# Patient Record
Sex: Female | Born: 1935 | Race: White | Hispanic: No | Smoking: Former smoker
Health system: Southern US, Community
[De-identification: ages and names within clinical notes are randomized; demographics above are authoritative.]

## PROBLEM LIST (undated history)

## (undated) DIAGNOSIS — H409 Unspecified glaucoma: Secondary | ICD-10-CM

## (undated) DIAGNOSIS — I351 Nonrheumatic aortic (valve) insufficiency: Secondary | ICD-10-CM

## (undated) DIAGNOSIS — M1990 Unspecified osteoarthritis, unspecified site: Secondary | ICD-10-CM

## (undated) DIAGNOSIS — I340 Nonrheumatic mitral (valve) insufficiency: Secondary | ICD-10-CM

## (undated) DIAGNOSIS — E785 Hyperlipidemia, unspecified: Secondary | ICD-10-CM

## (undated) HISTORY — PX: OTHER SURGICAL HISTORY: SHX169

## (undated) HISTORY — PX: TONSILLECTOMY: SUR1361

## (undated) HISTORY — PX: ABDOMINAL HYSTERECTOMY: SHX81

## (undated) HISTORY — PX: WRIST FRACTURE SURGERY: SHX121

---

## 1998-02-12 ENCOUNTER — Other Ambulatory Visit: Admission: RE | Admit: 1998-02-12 | Discharge: 1998-02-12 | Payer: Self-pay | Admitting: Gynecology

## 1999-04-25 ENCOUNTER — Other Ambulatory Visit: Admission: RE | Admit: 1999-04-25 | Discharge: 1999-04-25 | Payer: Self-pay | Admitting: Gynecology

## 2000-04-28 ENCOUNTER — Other Ambulatory Visit: Admission: RE | Admit: 2000-04-28 | Discharge: 2000-04-28 | Payer: Self-pay | Admitting: Gynecology

## 2000-09-01 ENCOUNTER — Encounter: Payer: Self-pay | Admitting: Gynecology

## 2000-09-01 ENCOUNTER — Encounter: Admission: RE | Admit: 2000-09-01 | Discharge: 2000-09-01 | Payer: Self-pay | Admitting: Gynecology

## 2001-03-29 ENCOUNTER — Encounter: Admission: RE | Admit: 2001-03-29 | Discharge: 2001-04-27 | Payer: Self-pay | Admitting: Orthopedic Surgery

## 2001-05-14 ENCOUNTER — Encounter: Payer: Self-pay | Admitting: Gynecology

## 2001-05-14 ENCOUNTER — Encounter: Admission: RE | Admit: 2001-05-14 | Discharge: 2001-05-14 | Payer: Self-pay | Admitting: Gynecology

## 2001-05-19 ENCOUNTER — Other Ambulatory Visit: Admission: RE | Admit: 2001-05-19 | Discharge: 2001-05-19 | Payer: Self-pay | Admitting: Gynecology

## 2001-05-21 ENCOUNTER — Encounter: Payer: Self-pay | Admitting: Gynecology

## 2001-05-21 ENCOUNTER — Encounter: Admission: RE | Admit: 2001-05-21 | Discharge: 2001-05-21 | Payer: Self-pay | Admitting: Gynecology

## 2002-08-04 ENCOUNTER — Other Ambulatory Visit: Admission: RE | Admit: 2002-08-04 | Discharge: 2002-08-04 | Payer: Self-pay | Admitting: Gynecology

## 2003-03-14 ENCOUNTER — Encounter: Admission: RE | Admit: 2003-03-14 | Discharge: 2003-03-14 | Payer: Self-pay | Admitting: Orthopedic Surgery

## 2003-03-15 ENCOUNTER — Ambulatory Visit (HOSPITAL_BASED_OUTPATIENT_CLINIC_OR_DEPARTMENT_OTHER): Admission: RE | Admit: 2003-03-15 | Discharge: 2003-03-15 | Payer: Self-pay | Admitting: Orthopedic Surgery

## 2003-08-31 ENCOUNTER — Encounter: Payer: Self-pay | Admitting: Gynecology

## 2003-08-31 ENCOUNTER — Encounter: Admission: RE | Admit: 2003-08-31 | Discharge: 2003-08-31 | Payer: Self-pay | Admitting: Gynecology

## 2004-09-02 ENCOUNTER — Other Ambulatory Visit: Admission: RE | Admit: 2004-09-02 | Discharge: 2004-09-02 | Payer: Self-pay | Admitting: Gynecology

## 2005-09-01 ENCOUNTER — Encounter: Admission: RE | Admit: 2005-09-01 | Discharge: 2005-09-01 | Payer: Self-pay | Admitting: Gynecology

## 2005-09-23 ENCOUNTER — Encounter: Admission: RE | Admit: 2005-09-23 | Discharge: 2005-09-23 | Payer: Self-pay | Admitting: Gynecology

## 2006-10-27 ENCOUNTER — Other Ambulatory Visit: Admission: RE | Admit: 2006-10-27 | Discharge: 2006-10-27 | Payer: Self-pay | Admitting: Gynecology

## 2006-12-01 IMAGING — CT CT A/P
4 of 8 series · 14 of 42 positions shown, 20 images · IV contrast (CONTRAST)
Comparison: none

______________________________________________________________

REASON FOR CONSULTATION: R/O SDH
____________________________________________
EXAM: HEAD W/O CONTRAST

[Series 3: arterial · axial · arterial · 0.77mm/px · z∈[+1346,+1416]mm · 2 of 43 slices shown]
[im 15/43  soft-tissue]
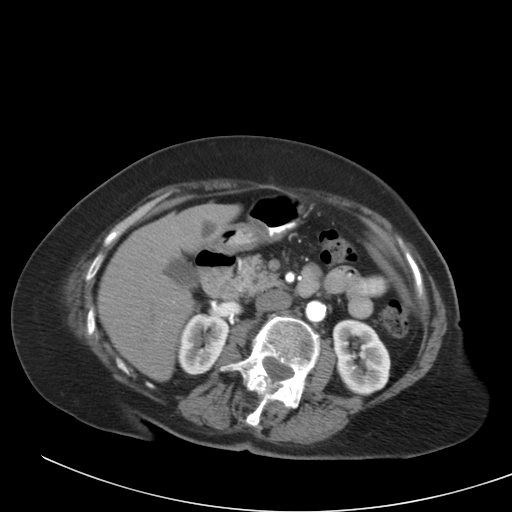
[im 29/43  soft-tissue]
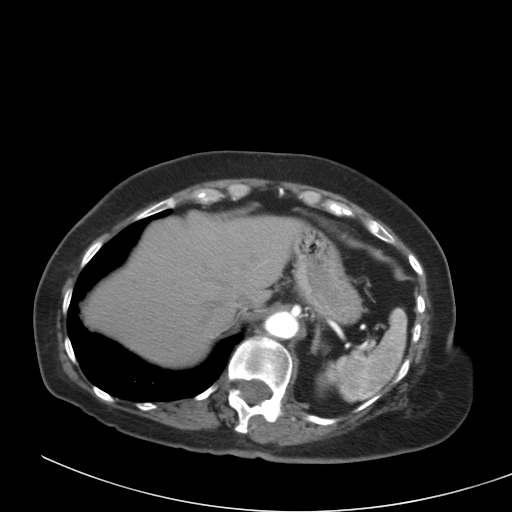

[Series 4: venous · axial · portal-venous · 0.79mm/px · z∈[+1136,+1416]mm · 5 of 85 slices shown, 10 images]
[im 15/85  soft-tissue]
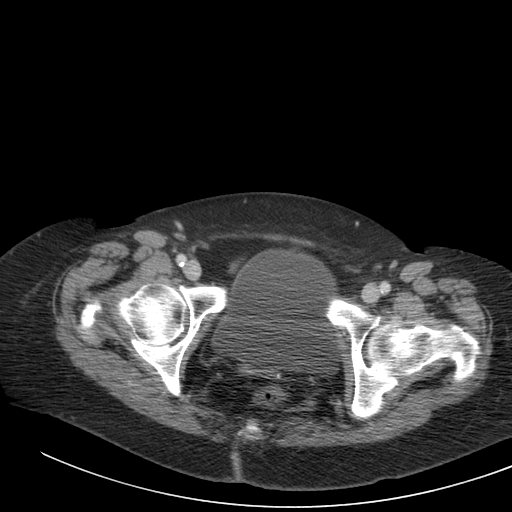
[im 15/85  bone]
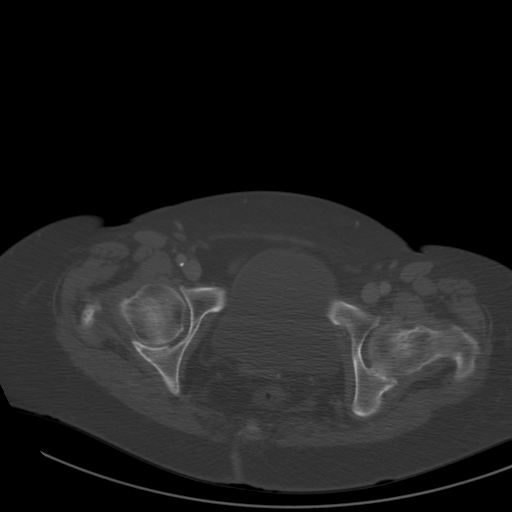
[im 29/85  soft-tissue]
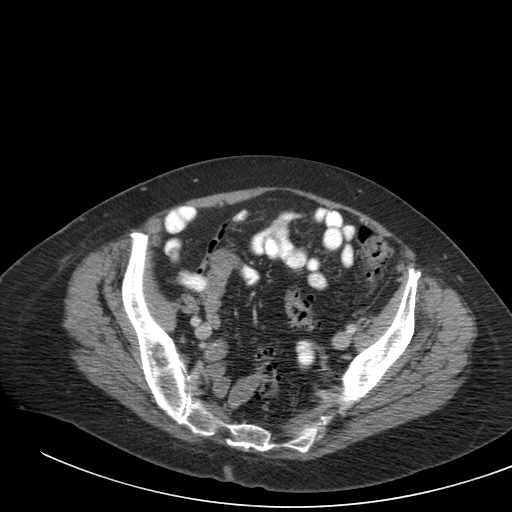
[im 29/85  lung]
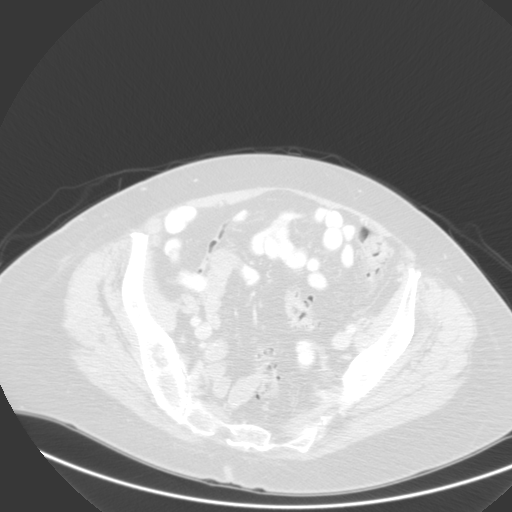
[im 43/85  soft-tissue]
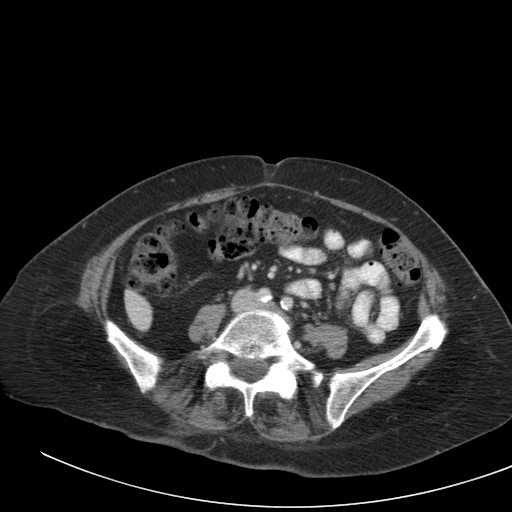
[im 43/85  lung]
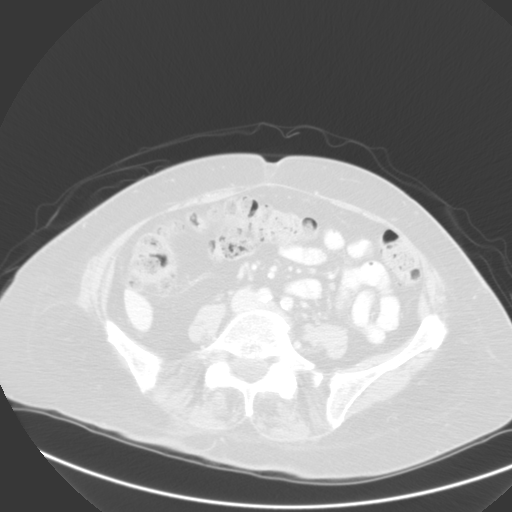
[im 57/85  soft-tissue]
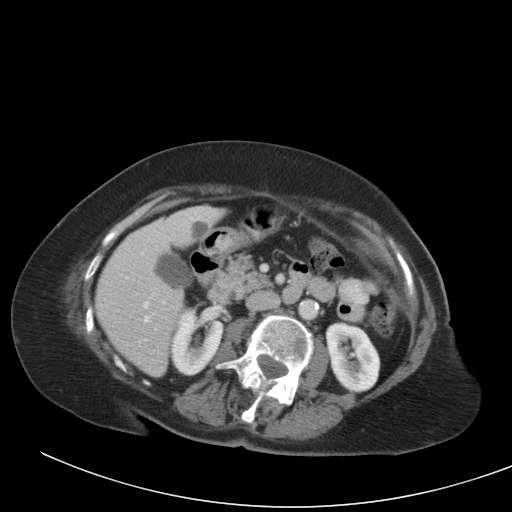
[im 57/85  lung]
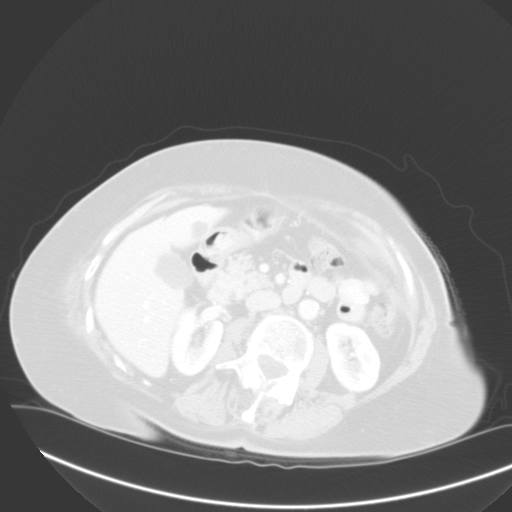
[im 71/85  soft-tissue]
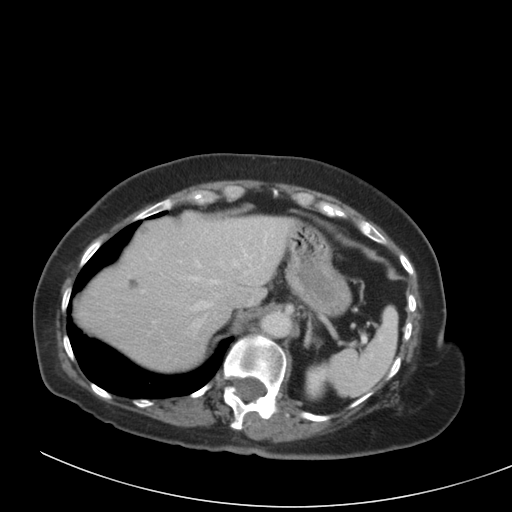
[im 71/85  lung]
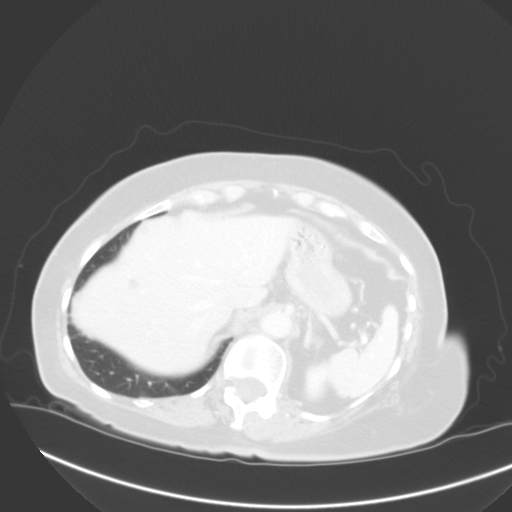

[Series 9: bladder delays · axial · 0.77mm/px · z∈[+1182,+1377]mm · 4 of 65 slices shown]
[im 13/65  soft-tissue]
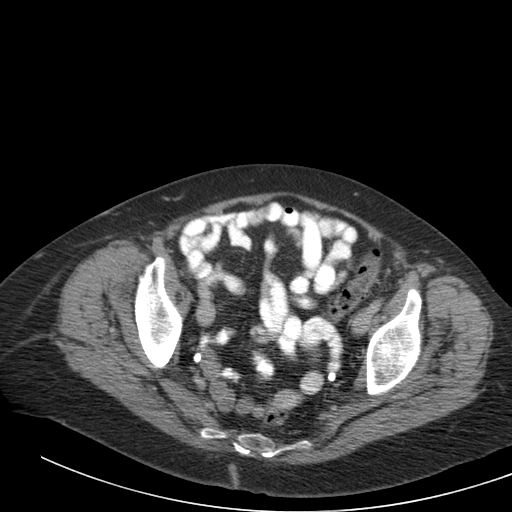
[im 26/65  soft-tissue]
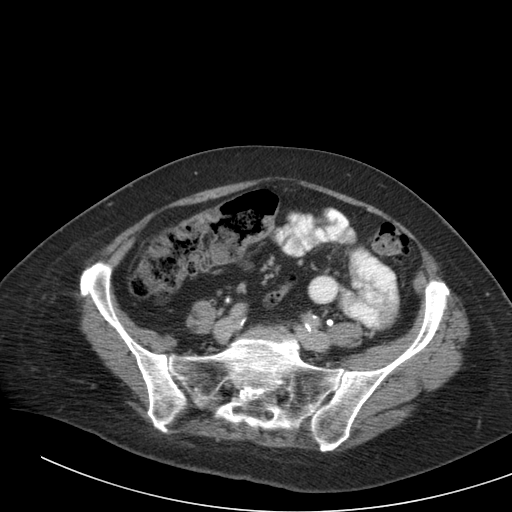
[im 39/65  soft-tissue]
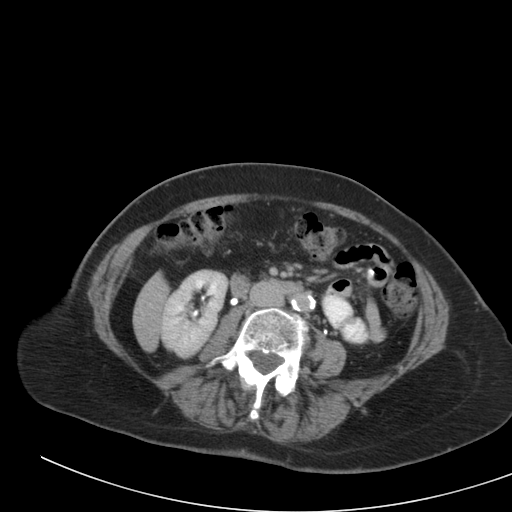
[im 52/65  soft-tissue]
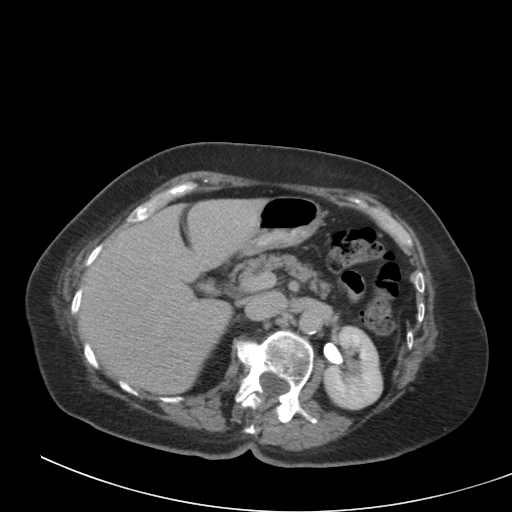

[Series 8058: coronals · coronal · 0.82mm/px · 3 of 70 slices shown, 4 images]
[im 24/70  soft-tissue]
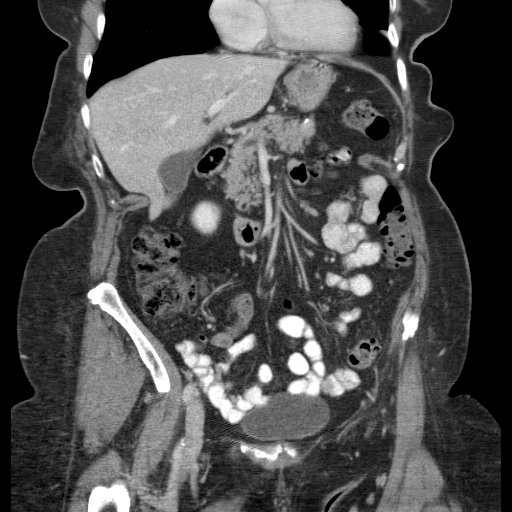
[im 31/70  soft-tissue]
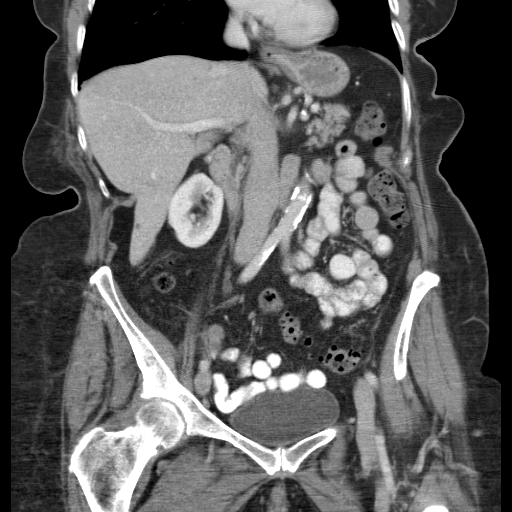
[im 31/70  bone]
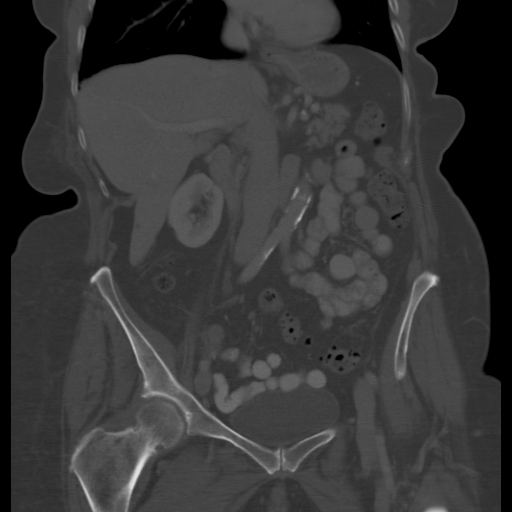
[im 39/70  soft-tissue]
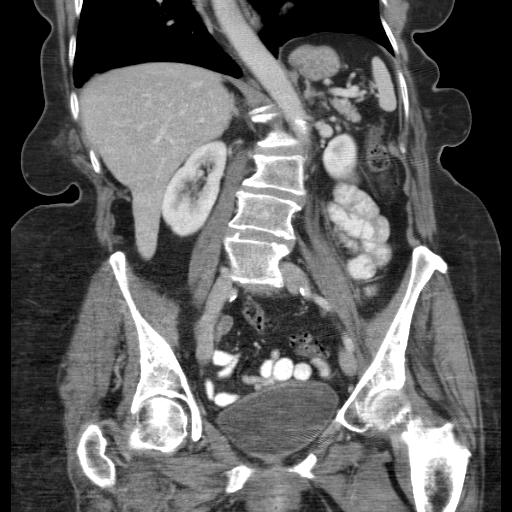

[14 of 42 positions shown; findings below may reference images not displayed]

FINDINGS: Emergent unenhanced brain CT was performed in the standard
fashion. The ventricles and sulci showed changes consistent with mild
atrophy. Bone windows showed no evidence of skull fracture. There is
no evidence of hemorrhage or hematoma.
IMPRESSION: 1. No significant change since 12/13/95. No evidence of acute
 intracranial abnormality.

## 2009-07-05 ENCOUNTER — Emergency Department (HOSPITAL_COMMUNITY): Admission: EM | Admit: 2009-07-05 | Discharge: 2009-07-06 | Payer: Self-pay | Admitting: Emergency Medicine

## 2010-02-07 ENCOUNTER — Emergency Department (HOSPITAL_COMMUNITY): Admission: EM | Admit: 2010-02-07 | Discharge: 2010-02-07 | Payer: Self-pay | Admitting: Emergency Medicine

## 2010-03-29 IMAGING — CT CT MAXILLOFACIAL W/O CM
3 series · 16 of 47 positions shown, 19 images · non-contrast
Comparison: None

CT HEAD

CLINICAL DATA: Status post fall, with head injury; hematoma
adjacent to left eyelid.

CT HEAD WITHOUT CONTRAST AND CT MAXILLOFACIAL WITHOUT CONTRAST
TECHNIQUE: Multidetector CT imaging of the head and maxillofacial
structures were performed using the standard protocol without
intravenous contrast. Multiplanar CT image reconstructions of the
maxillofacial structures were also generated.

[Series 3: headseq 4.8 h45s · axial · 0.43mm/px · z∈[+1318,+1437]mm · 10 of 30 slices shown, 13 images]
[im 3/30  brain]
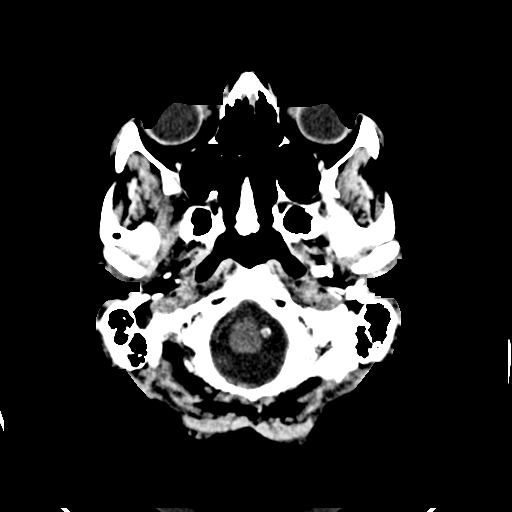
[im 3/30  bone]
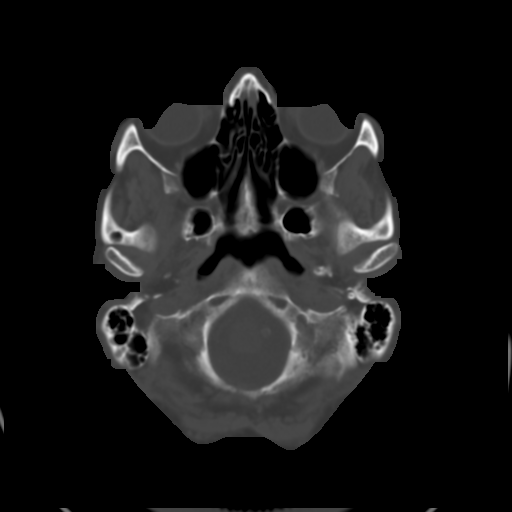
[im 6/30  bone]
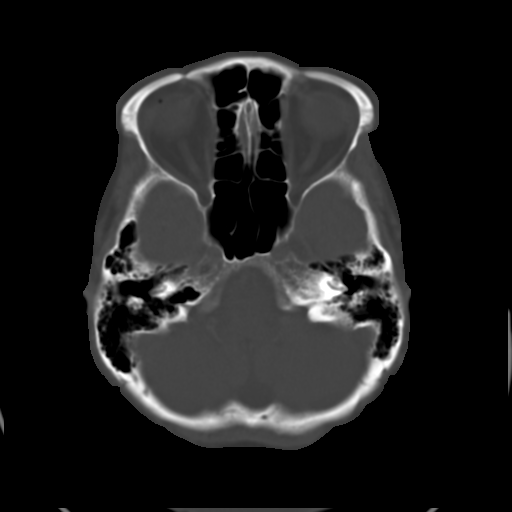
[im 9/30  bone]
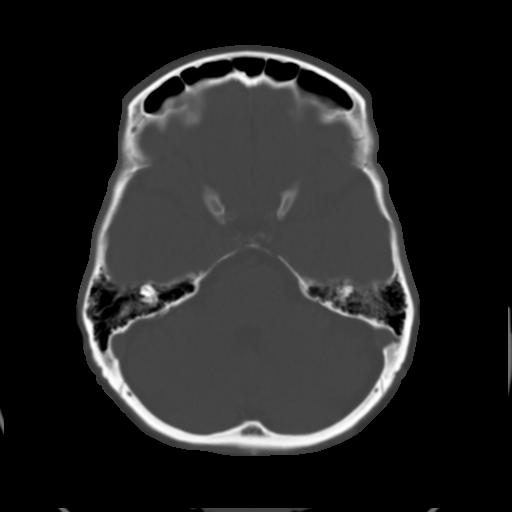
[im 11/30  bone]
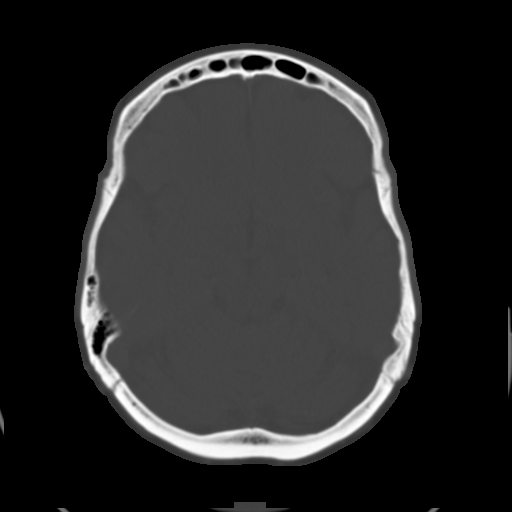
[im 14/30  brain]
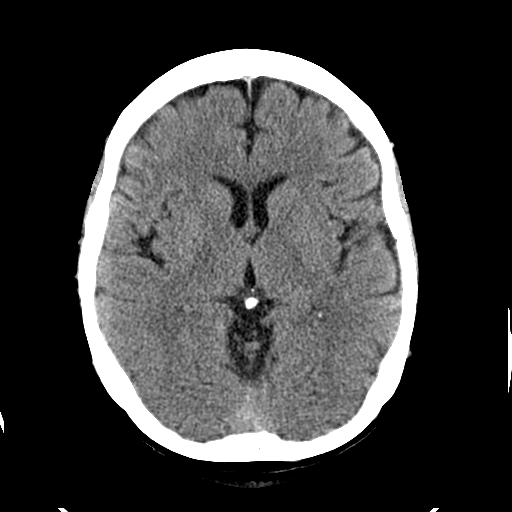
[im 14/30  bone]
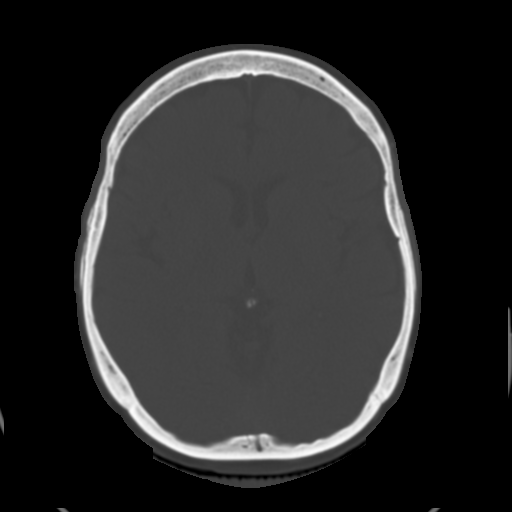
[im 17/30  bone]
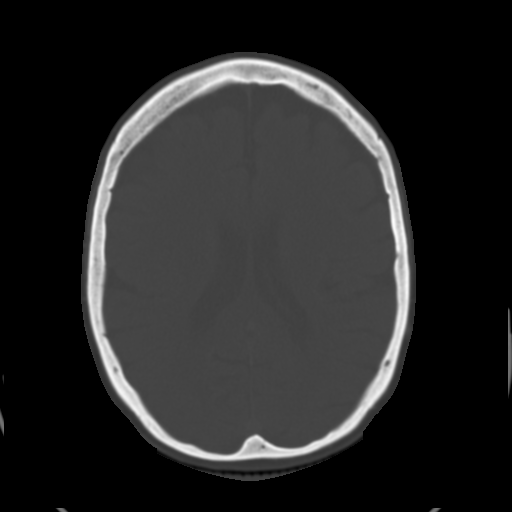
[im 20/30  bone]
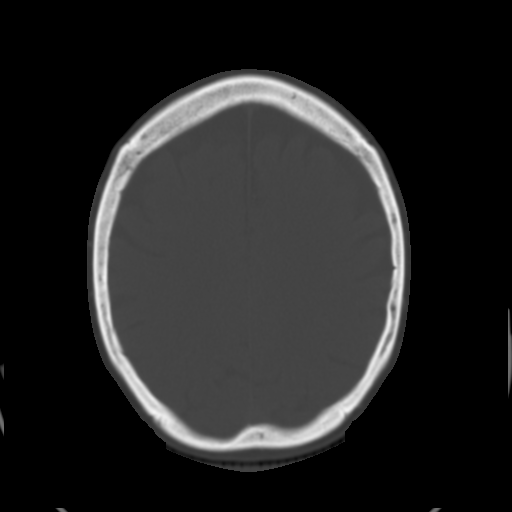
[im 23/30  bone]
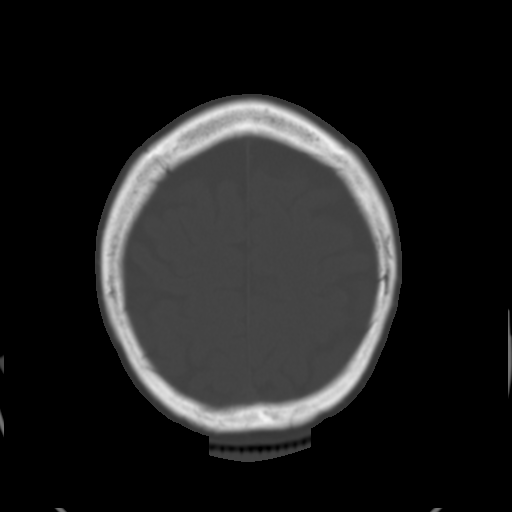
[im 25/30  brain]
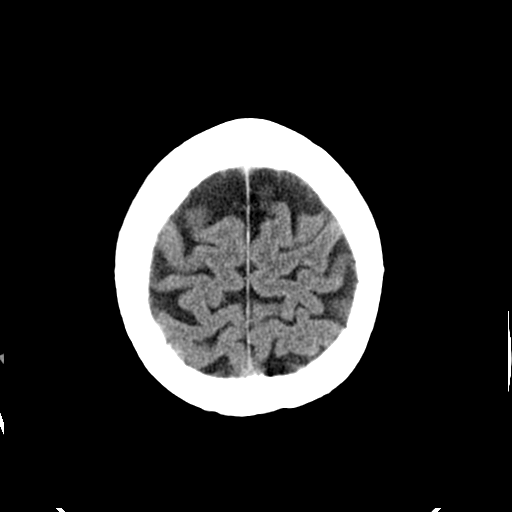
[im 25/30  bone]
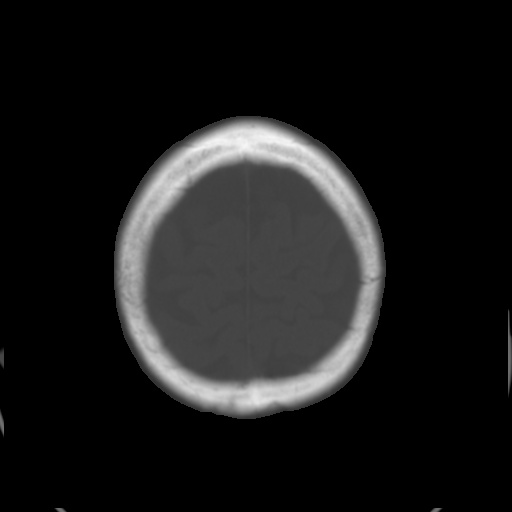
[im 28/30  bone]
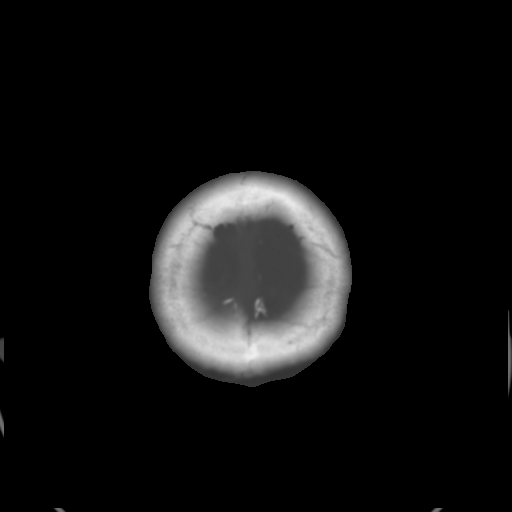

[Series 602: <mpr thick range> · coronal · 0.32mm/px · 3 of 58 slices shown]
[im 20/58  bone]
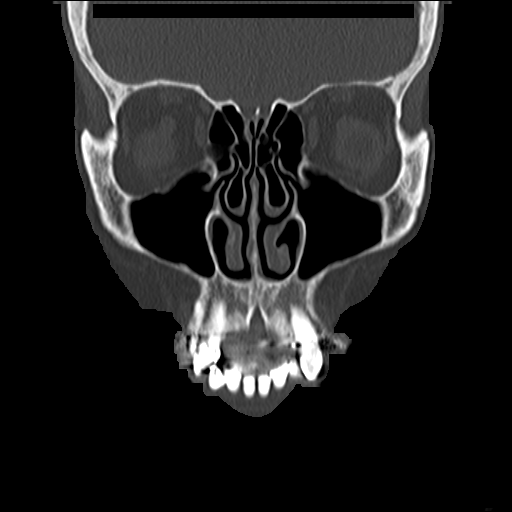
[im 26/58  bone]
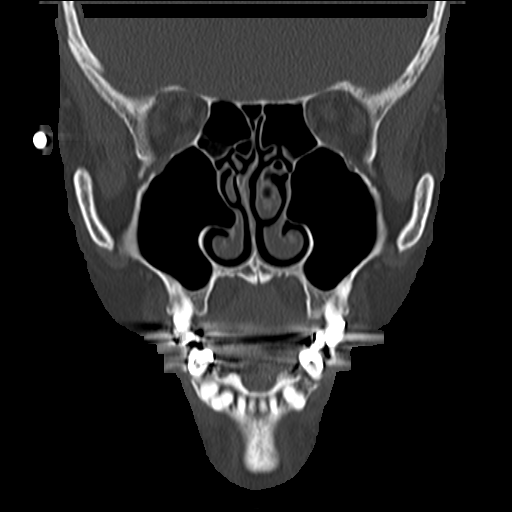
[im 32/58  bone]
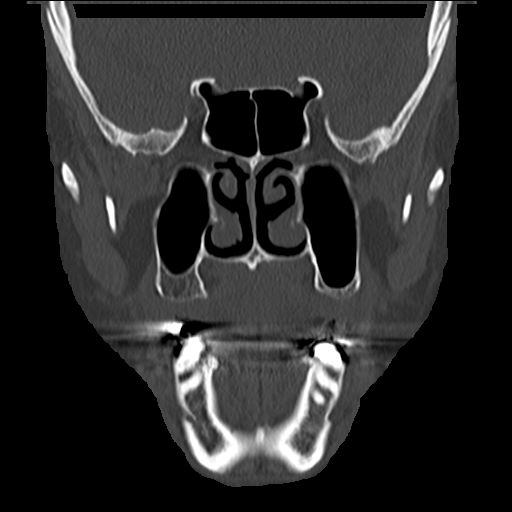

[Series 603: <mpr thick range(1)> · sagittal · 0.32mm/px · 3 of 74 slices shown]
[im 25/74  bone]
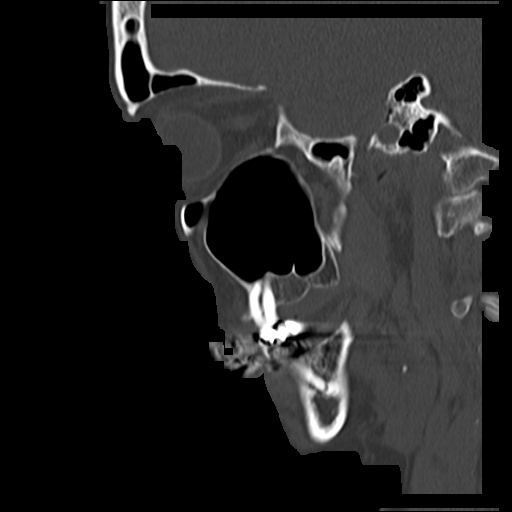
[im 37/74  bone]
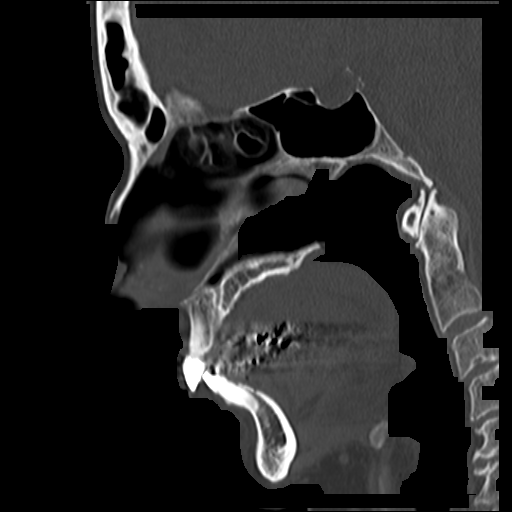
[im 49/74  bone]
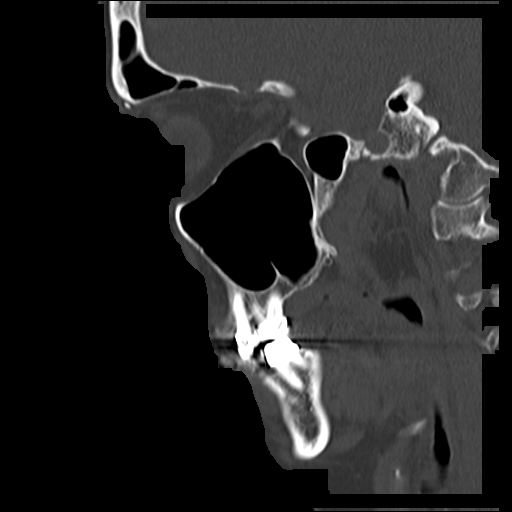

[16 of 47 positions shown; findings below may reference images not displayed]

FINDINGS: There is no evidence of acute infarction, mass lesion, or
intra- or extra-axial hemorrhage on CT.

The posterior fossa, including the cerebellum, brainstem and fourth
ventricle, is within normal limits.  The third and lateral
ventricles, and basal ganglia are unremarkable in appearance.  The
cerebral hemispheres are symmetric in appearance, with normal gray-
white differentiation.  No mass effect or midline shift is seen.

There is no evidence of fracture; visualized osseous structures are
unremarkable in appearance.  The visualized portions of the orbits
are within normal limits.  The paranasal sinuses and mastoid air
cells are well-aerated.  Soft tissue swelling is noted superior and
lateral to the right orbit.
IMPRESSION: 1.  No evidence of traumatic intracranial injury or fracture.
2.  Soft tissue swelling noted superior and lateral to the right
orbit.

CT MAXILLOFACIAL
FINDINGS: There is no evidence of fracture.  As described above,
there is soft tissue swelling superior and lateral to the right
orbit.  No additional soft tissue abnormalities are seen.  The
paranasal sinuses and mastoid air cells are well-aerated.  The
maxilla and mandible are within normal limits.  The
temporomandibular joints are unremarkable in appearance.
IMPRESSION: 1.  No evidence of fracture.
2.  Soft tissue swelling noted superior and lateral to the right
orbit.

## 2010-12-23 ENCOUNTER — Emergency Department (HOSPITAL_COMMUNITY)
Admission: EM | Admit: 2010-12-23 | Discharge: 2010-12-23 | Disposition: A | Discharge status: Home / Self Care | Payer: Medicare Other | Attending: Emergency Medicine | Admitting: Emergency Medicine

## 2010-12-23 DIAGNOSIS — E7800 Pure hypercholesterolemia, unspecified: Secondary | ICD-10-CM | POA: Insufficient documentation

## 2010-12-23 DIAGNOSIS — S0180XA Unspecified open wound of other part of head, initial encounter: Secondary | ICD-10-CM | POA: Insufficient documentation

## 2010-12-23 DIAGNOSIS — H3530 Unspecified macular degeneration: Secondary | ICD-10-CM | POA: Insufficient documentation

## 2010-12-23 DIAGNOSIS — Y9289 Other specified places as the place of occurrence of the external cause: Secondary | ICD-10-CM | POA: Insufficient documentation

## 2010-12-23 DIAGNOSIS — W010XXA Fall on same level from slipping, tripping and stumbling without subsequent striking against object, initial encounter: Secondary | ICD-10-CM | POA: Insufficient documentation

## 2011-01-31 ENCOUNTER — Ambulatory Visit: Payer: Medicare Other | Attending: Ophthalmology | Admitting: Occupational Therapy

## 2011-01-31 DIAGNOSIS — H3530 Unspecified macular degeneration: Secondary | ICD-10-CM | POA: Insufficient documentation

## 2011-01-31 DIAGNOSIS — IMO0001 Reserved for inherently not codable concepts without codable children: Secondary | ICD-10-CM | POA: Insufficient documentation

## 2011-01-31 DIAGNOSIS — H53419 Scotoma involving central area, unspecified eye: Secondary | ICD-10-CM | POA: Insufficient documentation

## 2011-02-03 LAB — POCT I-STAT, CHEM 8
Calcium, Ion: 1.27 mmol/L (ref 1.12–1.32)
Chloride: 101 mEq/L (ref 96–112)
HCT: 38 % (ref 36.0–46.0)
Hemoglobin: 12.9 g/dL (ref 12.0–15.0)
Potassium: 4 mEq/L (ref 3.5–5.1)

## 2011-02-03 LAB — DIFFERENTIAL
Eosinophils Relative: 2 % (ref 0–5)
Lymphocytes Relative: 36 % (ref 12–46)
Lymphs Abs: 2.7 10*3/uL (ref 0.7–4.0)
Monocytes Absolute: 0.8 10*3/uL (ref 0.1–1.0)

## 2011-02-03 LAB — CBC
HCT: 37.5 % (ref 36.0–46.0)
Hemoglobin: 12.4 g/dL (ref 12.0–15.0)
Platelets: 223 10*3/uL (ref 150–400)
WBC: 7.8 10*3/uL (ref 4.0–10.5)

## 2011-02-03 LAB — POCT CARDIAC MARKERS: Troponin i, poc: <0.05 ng/mL (ref 0.00–0.09)

## 2011-02-03 LAB — D-DIMER, QUANTITATIVE: D-Dimer, Quant: 0.42 ug/mL-FEU (ref 0.00–0.48)

## 2011-03-28 NOTE — Op Note (Signed)
   NAME:  Gabrielle [name], Gabrielle [name]                   ACCOUNT NO.:  [id number]   MEDICAL RECORD NO.:  [id number]                   PATIENT TYPE:  AMB   LOCATION:  DSC                                  FACILITY:  [facility]   PHYSICIAN:  Artist Pais. Mina Marble, M.D.           DATE OF BIRTH:  01/20/1927   DATE OF PROCEDURE:  03/15/2003  DATE OF DISCHARGE:                                 OPERATIVE REPORT   PREOPERATIVE DIAGNOSIS:  Right scaphoid fracture in the wrist.   POSTOPERATIVE DIAGNOSIS:  Right scaphoid fracture in the wrist.   PROCEDURE:  Open reduction and internal fixation of above using 17.5 mm Accu-  Check standard screw.   SURGEON:  Artist Pais. Mina Marble, M.D.   ASSISTANT:  Aura Fey. Bobbe Medico.   ANESTHESIA:  General anesthesia.   TOURNIQUET TIME:  25 minutes.   COMPLICATIONS:  None.   DRAINS:  None.   DESCRIPTION OF PROCEDURE:  The patient was taken to the operating room.  After the induction of general anesthesia, right upper extremity was prepped  and draped in usual sterile fashion.  Esmarch was used to exsanguinate the  limb.  Tourniquet was then inflated to 250 mmHg.  At this point in time, a  small incision was made over the proximal pole of the scaphoid dorsally and  guidewire was placed on the proximal pole.  Intraoperative fluoroscopy was  then used to guide the guidewire from proximal to distal and was given out  to the base of the trapezium.  Intraoperative x-rays in both the AP, lateral  and oblique views showed good placement of the guidewire.  A depth gauge was  then used to determine 17.5 mm screw which was then tapped in place  sequentially using standard Accu-Check protocol.  Intraoperative x-ray  showed good reduction in both the AP, lateral and oblique views.  The wound  was then copiously irrigated and closed with 4-0 nylon.  A sterile dressing  of Xeroform, 4x4s and radial dorsal splint was applied.  The patient  tolerated the procedure well and was taken to  the recovery room in stable  condition.                                               Artist Pais Mina Marble, M.D.    MAW/MEDQ  D:  03/15/2003  T:  03/16/2003  Job:  [id number]

## 2011-11-17 DIAGNOSIS — H353290 Exudative age-related macular degeneration, unspecified eye, stage unspecified: Secondary | ICD-10-CM | POA: Diagnosis not present

## 2011-11-17 DIAGNOSIS — H31019 Macula scars of posterior pole (postinflammatory) (post-traumatic), unspecified eye: Secondary | ICD-10-CM | POA: Diagnosis not present

## 2011-11-17 DIAGNOSIS — H43819 Vitreous degeneration, unspecified eye: Secondary | ICD-10-CM | POA: Diagnosis not present

## 2011-11-17 DIAGNOSIS — H35059 Retinal neovascularization, unspecified, unspecified eye: Secondary | ICD-10-CM | POA: Diagnosis not present

## 2011-12-22 DIAGNOSIS — H35059 Retinal neovascularization, unspecified, unspecified eye: Secondary | ICD-10-CM | POA: Diagnosis not present

## 2011-12-22 DIAGNOSIS — H31019 Macula scars of posterior pole (postinflammatory) (post-traumatic), unspecified eye: Secondary | ICD-10-CM | POA: Diagnosis not present

## 2011-12-22 DIAGNOSIS — H43819 Vitreous degeneration, unspecified eye: Secondary | ICD-10-CM | POA: Diagnosis not present

## 2011-12-22 DIAGNOSIS — H353290 Exudative age-related macular degeneration, unspecified eye, stage unspecified: Secondary | ICD-10-CM | POA: Diagnosis not present

## 2012-01-15 DIAGNOSIS — M899 Disorder of bone, unspecified: Secondary | ICD-10-CM | POA: Diagnosis not present

## 2012-01-15 DIAGNOSIS — M949 Disorder of cartilage, unspecified: Secondary | ICD-10-CM | POA: Diagnosis not present

## 2012-01-15 DIAGNOSIS — G479 Sleep disorder, unspecified: Secondary | ICD-10-CM | POA: Diagnosis not present

## 2012-01-15 DIAGNOSIS — E782 Mixed hyperlipidemia: Secondary | ICD-10-CM | POA: Diagnosis not present

## 2012-01-15 DIAGNOSIS — H409 Unspecified glaucoma: Secondary | ICD-10-CM | POA: Diagnosis not present

## 2012-01-15 DIAGNOSIS — R7301 Impaired fasting glucose: Secondary | ICD-10-CM | POA: Diagnosis not present

## 2012-01-15 DIAGNOSIS — M6281 Muscle weakness (generalized): Secondary | ICD-10-CM | POA: Diagnosis not present

## 2012-02-02 DIAGNOSIS — M949 Disorder of cartilage, unspecified: Secondary | ICD-10-CM | POA: Diagnosis not present

## 2012-02-03 DIAGNOSIS — H353290 Exudative age-related macular degeneration, unspecified eye, stage unspecified: Secondary | ICD-10-CM | POA: Diagnosis not present

## 2012-02-03 DIAGNOSIS — H31019 Macula scars of posterior pole (postinflammatory) (post-traumatic), unspecified eye: Secondary | ICD-10-CM | POA: Diagnosis not present

## 2012-02-03 DIAGNOSIS — H43819 Vitreous degeneration, unspecified eye: Secondary | ICD-10-CM | POA: Diagnosis not present

## 2012-02-03 DIAGNOSIS — H35059 Retinal neovascularization, unspecified, unspecified eye: Secondary | ICD-10-CM | POA: Diagnosis not present

## 2012-02-19 DIAGNOSIS — L218 Other seborrheic dermatitis: Secondary | ICD-10-CM | POA: Diagnosis not present

## 2012-03-15 ENCOUNTER — Ambulatory Visit: Payer: Medicare Other | Attending: Family Medicine | Admitting: Physical Therapy

## 2012-03-15 DIAGNOSIS — R5381 Other malaise: Secondary | ICD-10-CM | POA: Diagnosis not present

## 2012-03-15 DIAGNOSIS — IMO0001 Reserved for inherently not codable concepts without codable children: Secondary | ICD-10-CM | POA: Diagnosis not present

## 2012-03-15 DIAGNOSIS — M6281 Muscle weakness (generalized): Secondary | ICD-10-CM | POA: Insufficient documentation

## 2012-03-23 ENCOUNTER — Ambulatory Visit: Payer: Medicare Other | Admitting: Physical Therapy

## 2012-03-23 DIAGNOSIS — M6281 Muscle weakness (generalized): Secondary | ICD-10-CM | POA: Diagnosis not present

## 2012-03-23 DIAGNOSIS — IMO0001 Reserved for inherently not codable concepts without codable children: Secondary | ICD-10-CM | POA: Diagnosis not present

## 2012-03-23 DIAGNOSIS — R5381 Other malaise: Secondary | ICD-10-CM | POA: Diagnosis not present

## 2012-03-24 ENCOUNTER — Ambulatory Visit: Payer: Medicare Other | Admitting: Physical Therapy

## 2012-03-24 DIAGNOSIS — R5381 Other malaise: Secondary | ICD-10-CM | POA: Diagnosis not present

## 2012-03-24 DIAGNOSIS — M6281 Muscle weakness (generalized): Secondary | ICD-10-CM | POA: Diagnosis not present

## 2012-03-24 DIAGNOSIS — IMO0001 Reserved for inherently not codable concepts without codable children: Secondary | ICD-10-CM | POA: Diagnosis not present

## 2012-03-29 ENCOUNTER — Ambulatory Visit: Payer: Medicare Other | Admitting: Physical Therapy

## 2012-03-29 DIAGNOSIS — IMO0001 Reserved for inherently not codable concepts without codable children: Secondary | ICD-10-CM | POA: Diagnosis not present

## 2012-03-29 DIAGNOSIS — M6281 Muscle weakness (generalized): Secondary | ICD-10-CM | POA: Diagnosis not present

## 2012-03-29 DIAGNOSIS — R5381 Other malaise: Secondary | ICD-10-CM | POA: Diagnosis not present

## 2012-03-30 ENCOUNTER — Encounter: Payer: Medicare Other | Admitting: Physical Therapy

## 2012-03-30 DIAGNOSIS — H353290 Exudative age-related macular degeneration, unspecified eye, stage unspecified: Secondary | ICD-10-CM | POA: Diagnosis not present

## 2012-03-30 DIAGNOSIS — H31019 Macula scars of posterior pole (postinflammatory) (post-traumatic), unspecified eye: Secondary | ICD-10-CM | POA: Diagnosis not present

## 2012-03-30 DIAGNOSIS — H35059 Retinal neovascularization, unspecified, unspecified eye: Secondary | ICD-10-CM | POA: Diagnosis not present

## 2012-03-30 DIAGNOSIS — H43819 Vitreous degeneration, unspecified eye: Secondary | ICD-10-CM | POA: Diagnosis not present

## 2012-04-01 ENCOUNTER — Ambulatory Visit: Payer: Medicare Other | Admitting: Physical Therapy

## 2012-04-01 DIAGNOSIS — IMO0001 Reserved for inherently not codable concepts without codable children: Secondary | ICD-10-CM | POA: Diagnosis not present

## 2012-04-01 DIAGNOSIS — R5381 Other malaise: Secondary | ICD-10-CM | POA: Diagnosis not present

## 2012-04-01 DIAGNOSIS — M6281 Muscle weakness (generalized): Secondary | ICD-10-CM | POA: Diagnosis not present

## 2012-04-06 ENCOUNTER — Ambulatory Visit: Payer: Medicare Other | Admitting: Physical Therapy

## 2012-04-06 DIAGNOSIS — R5381 Other malaise: Secondary | ICD-10-CM | POA: Diagnosis not present

## 2012-04-06 DIAGNOSIS — M6281 Muscle weakness (generalized): Secondary | ICD-10-CM | POA: Diagnosis not present

## 2012-04-06 DIAGNOSIS — IMO0001 Reserved for inherently not codable concepts without codable children: Secondary | ICD-10-CM | POA: Diagnosis not present

## 2012-04-08 ENCOUNTER — Ambulatory Visit: Payer: Medicare Other | Admitting: Physical Therapy

## 2012-04-08 DIAGNOSIS — R5381 Other malaise: Secondary | ICD-10-CM | POA: Diagnosis not present

## 2012-04-08 DIAGNOSIS — IMO0001 Reserved for inherently not codable concepts without codable children: Secondary | ICD-10-CM | POA: Diagnosis not present

## 2012-04-08 DIAGNOSIS — M6281 Muscle weakness (generalized): Secondary | ICD-10-CM | POA: Diagnosis not present

## 2012-04-13 ENCOUNTER — Encounter: Payer: Medicare Other | Admitting: Physical Therapy

## 2012-04-15 ENCOUNTER — Ambulatory Visit: Payer: Medicare Other | Attending: Family Medicine | Admitting: Physical Therapy

## 2012-04-15 DIAGNOSIS — R5381 Other malaise: Secondary | ICD-10-CM | POA: Diagnosis not present

## 2012-04-15 DIAGNOSIS — M6281 Muscle weakness (generalized): Secondary | ICD-10-CM | POA: Insufficient documentation

## 2012-04-15 DIAGNOSIS — IMO0001 Reserved for inherently not codable concepts without codable children: Secondary | ICD-10-CM | POA: Diagnosis not present

## 2012-04-27 DIAGNOSIS — J329 Chronic sinusitis, unspecified: Secondary | ICD-10-CM | POA: Diagnosis not present

## 2012-05-27 DIAGNOSIS — M25569 Pain in unspecified knee: Secondary | ICD-10-CM | POA: Diagnosis not present

## 2012-05-27 DIAGNOSIS — M25469 Effusion, unspecified knee: Secondary | ICD-10-CM | POA: Diagnosis not present

## 2012-05-28 DIAGNOSIS — H43819 Vitreous degeneration, unspecified eye: Secondary | ICD-10-CM | POA: Diagnosis not present

## 2012-05-28 DIAGNOSIS — H353290 Exudative age-related macular degeneration, unspecified eye, stage unspecified: Secondary | ICD-10-CM | POA: Diagnosis not present

## 2012-05-28 DIAGNOSIS — H31019 Macula scars of posterior pole (postinflammatory) (post-traumatic), unspecified eye: Secondary | ICD-10-CM | POA: Diagnosis not present

## 2012-05-28 DIAGNOSIS — H35059 Retinal neovascularization, unspecified, unspecified eye: Secondary | ICD-10-CM | POA: Diagnosis not present

## 2012-07-20 DIAGNOSIS — M1710 Unilateral primary osteoarthritis, unspecified knee: Secondary | ICD-10-CM | POA: Diagnosis not present

## 2012-07-20 DIAGNOSIS — M25569 Pain in unspecified knee: Secondary | ICD-10-CM | POA: Diagnosis not present

## 2012-07-22 DIAGNOSIS — M1710 Unilateral primary osteoarthritis, unspecified knee: Secondary | ICD-10-CM | POA: Diagnosis not present

## 2012-07-29 DIAGNOSIS — S82109A Unspecified fracture of upper end of unspecified tibia, initial encounter for closed fracture: Secondary | ICD-10-CM | POA: Diagnosis not present

## 2012-07-29 DIAGNOSIS — IMO0002 Reserved for concepts with insufficient information to code with codable children: Secondary | ICD-10-CM | POA: Diagnosis not present

## 2012-07-29 DIAGNOSIS — S83289A Other tear of lateral meniscus, current injury, unspecified knee, initial encounter: Secondary | ICD-10-CM | POA: Diagnosis not present

## 2012-08-06 DIAGNOSIS — H31019 Macula scars of posterior pole (postinflammatory) (post-traumatic), unspecified eye: Secondary | ICD-10-CM | POA: Diagnosis not present

## 2012-08-06 DIAGNOSIS — H353290 Exudative age-related macular degeneration, unspecified eye, stage unspecified: Secondary | ICD-10-CM | POA: Diagnosis not present

## 2012-08-06 DIAGNOSIS — H35059 Retinal neovascularization, unspecified, unspecified eye: Secondary | ICD-10-CM | POA: Diagnosis not present

## 2012-08-06 DIAGNOSIS — H43819 Vitreous degeneration, unspecified eye: Secondary | ICD-10-CM | POA: Diagnosis not present

## 2012-08-17 DIAGNOSIS — E782 Mixed hyperlipidemia: Secondary | ICD-10-CM | POA: Diagnosis not present

## 2012-08-17 DIAGNOSIS — M84369A Stress fracture, unspecified tibia and fibula, initial encounter for fracture: Secondary | ICD-10-CM | POA: Diagnosis not present

## 2012-08-17 DIAGNOSIS — M899 Disorder of bone, unspecified: Secondary | ICD-10-CM | POA: Diagnosis not present

## 2012-08-17 DIAGNOSIS — F329 Major depressive disorder, single episode, unspecified: Secondary | ICD-10-CM | POA: Diagnosis not present

## 2012-08-17 DIAGNOSIS — Z23 Encounter for immunization: Secondary | ICD-10-CM | POA: Diagnosis not present

## 2012-08-17 DIAGNOSIS — R94120 Abnormal auditory function study: Secondary | ICD-10-CM | POA: Diagnosis not present

## 2012-08-17 DIAGNOSIS — R7301 Impaired fasting glucose: Secondary | ICD-10-CM | POA: Diagnosis not present

## 2012-08-17 DIAGNOSIS — K5732 Diverticulitis of large intestine without perforation or abscess without bleeding: Secondary | ICD-10-CM | POA: Diagnosis not present

## 2012-08-19 DIAGNOSIS — K5732 Diverticulitis of large intestine without perforation or abscess without bleeding: Secondary | ICD-10-CM | POA: Diagnosis not present

## 2012-08-19 DIAGNOSIS — M84369A Stress fracture, unspecified tibia and fibula, initial encounter for fracture: Secondary | ICD-10-CM | POA: Diagnosis not present

## 2012-08-19 DIAGNOSIS — R7301 Impaired fasting glucose: Secondary | ICD-10-CM | POA: Diagnosis not present

## 2012-08-19 DIAGNOSIS — M949 Disorder of cartilage, unspecified: Secondary | ICD-10-CM | POA: Diagnosis not present

## 2012-08-19 DIAGNOSIS — F329 Major depressive disorder, single episode, unspecified: Secondary | ICD-10-CM | POA: Diagnosis not present

## 2012-08-19 DIAGNOSIS — M899 Disorder of bone, unspecified: Secondary | ICD-10-CM | POA: Diagnosis not present

## 2012-08-19 DIAGNOSIS — E875 Hyperkalemia: Secondary | ICD-10-CM | POA: Diagnosis not present

## 2012-08-19 DIAGNOSIS — E782 Mixed hyperlipidemia: Secondary | ICD-10-CM | POA: Diagnosis not present

## 2012-09-02 DIAGNOSIS — S82109A Unspecified fracture of upper end of unspecified tibia, initial encounter for closed fracture: Secondary | ICD-10-CM | POA: Diagnosis not present

## 2012-09-09 DIAGNOSIS — H903 Sensorineural hearing loss, bilateral: Secondary | ICD-10-CM | POA: Diagnosis not present

## 2012-09-13 DIAGNOSIS — R262 Difficulty in walking, not elsewhere classified: Secondary | ICD-10-CM | POA: Diagnosis not present

## 2012-09-13 DIAGNOSIS — S82109A Unspecified fracture of upper end of unspecified tibia, initial encounter for closed fracture: Secondary | ICD-10-CM | POA: Diagnosis not present

## 2012-09-15 DIAGNOSIS — R262 Difficulty in walking, not elsewhere classified: Secondary | ICD-10-CM | POA: Diagnosis not present

## 2012-09-15 DIAGNOSIS — S82109A Unspecified fracture of upper end of unspecified tibia, initial encounter for closed fracture: Secondary | ICD-10-CM | POA: Diagnosis not present

## 2012-09-24 DIAGNOSIS — R262 Difficulty in walking, not elsewhere classified: Secondary | ICD-10-CM | POA: Diagnosis not present

## 2012-09-24 DIAGNOSIS — S82109A Unspecified fracture of upper end of unspecified tibia, initial encounter for closed fracture: Secondary | ICD-10-CM | POA: Diagnosis not present

## 2012-09-29 DIAGNOSIS — R262 Difficulty in walking, not elsewhere classified: Secondary | ICD-10-CM | POA: Diagnosis not present

## 2012-09-29 DIAGNOSIS — S82109A Unspecified fracture of upper end of unspecified tibia, initial encounter for closed fracture: Secondary | ICD-10-CM | POA: Diagnosis not present

## 2012-10-01 DIAGNOSIS — S82109A Unspecified fracture of upper end of unspecified tibia, initial encounter for closed fracture: Secondary | ICD-10-CM | POA: Diagnosis not present

## 2012-10-01 DIAGNOSIS — R262 Difficulty in walking, not elsewhere classified: Secondary | ICD-10-CM | POA: Diagnosis not present

## 2012-10-13 DIAGNOSIS — R262 Difficulty in walking, not elsewhere classified: Secondary | ICD-10-CM | POA: Diagnosis not present

## 2012-10-13 DIAGNOSIS — S82109A Unspecified fracture of upper end of unspecified tibia, initial encounter for closed fracture: Secondary | ICD-10-CM | POA: Diagnosis not present

## 2012-10-18 DIAGNOSIS — S82109A Unspecified fracture of upper end of unspecified tibia, initial encounter for closed fracture: Secondary | ICD-10-CM | POA: Diagnosis not present

## 2012-10-18 DIAGNOSIS — R262 Difficulty in walking, not elsewhere classified: Secondary | ICD-10-CM | POA: Diagnosis not present

## 2012-10-26 DIAGNOSIS — H353290 Exudative age-related macular degeneration, unspecified eye, stage unspecified: Secondary | ICD-10-CM | POA: Diagnosis not present

## 2012-10-26 DIAGNOSIS — H35059 Retinal neovascularization, unspecified, unspecified eye: Secondary | ICD-10-CM | POA: Diagnosis not present

## 2012-10-29 DIAGNOSIS — S82109A Unspecified fracture of upper end of unspecified tibia, initial encounter for closed fracture: Secondary | ICD-10-CM | POA: Diagnosis not present

## 2012-10-29 DIAGNOSIS — R262 Difficulty in walking, not elsewhere classified: Secondary | ICD-10-CM | POA: Diagnosis not present

## 2012-11-05 DIAGNOSIS — S82109A Unspecified fracture of upper end of unspecified tibia, initial encounter for closed fracture: Secondary | ICD-10-CM | POA: Diagnosis not present

## 2012-11-05 DIAGNOSIS — R262 Difficulty in walking, not elsewhere classified: Secondary | ICD-10-CM | POA: Diagnosis not present

## 2012-11-08 DIAGNOSIS — R262 Difficulty in walking, not elsewhere classified: Secondary | ICD-10-CM | POA: Diagnosis not present

## 2012-11-08 DIAGNOSIS — S82109A Unspecified fracture of upper end of unspecified tibia, initial encounter for closed fracture: Secondary | ICD-10-CM | POA: Diagnosis not present

## 2012-11-11 DIAGNOSIS — R262 Difficulty in walking, not elsewhere classified: Secondary | ICD-10-CM | POA: Diagnosis not present

## 2012-11-11 DIAGNOSIS — S82109A Unspecified fracture of upper end of unspecified tibia, initial encounter for closed fracture: Secondary | ICD-10-CM | POA: Diagnosis not present

## 2012-11-15 DIAGNOSIS — R262 Difficulty in walking, not elsewhere classified: Secondary | ICD-10-CM | POA: Diagnosis not present

## 2012-11-15 DIAGNOSIS — S82109A Unspecified fracture of upper end of unspecified tibia, initial encounter for closed fracture: Secondary | ICD-10-CM | POA: Diagnosis not present

## 2012-11-17 DIAGNOSIS — R262 Difficulty in walking, not elsewhere classified: Secondary | ICD-10-CM | POA: Diagnosis not present

## 2012-11-17 DIAGNOSIS — S82109A Unspecified fracture of upper end of unspecified tibia, initial encounter for closed fracture: Secondary | ICD-10-CM | POA: Diagnosis not present

## 2012-11-22 DIAGNOSIS — R262 Difficulty in walking, not elsewhere classified: Secondary | ICD-10-CM | POA: Diagnosis not present

## 2012-11-22 DIAGNOSIS — S82109A Unspecified fracture of upper end of unspecified tibia, initial encounter for closed fracture: Secondary | ICD-10-CM | POA: Diagnosis not present

## 2012-12-02 DIAGNOSIS — R262 Difficulty in walking, not elsewhere classified: Secondary | ICD-10-CM | POA: Diagnosis not present

## 2012-12-02 DIAGNOSIS — S82109A Unspecified fracture of upper end of unspecified tibia, initial encounter for closed fracture: Secondary | ICD-10-CM | POA: Diagnosis not present

## 2012-12-10 DIAGNOSIS — S82109A Unspecified fracture of upper end of unspecified tibia, initial encounter for closed fracture: Secondary | ICD-10-CM | POA: Diagnosis not present

## 2012-12-10 DIAGNOSIS — R262 Difficulty in walking, not elsewhere classified: Secondary | ICD-10-CM | POA: Diagnosis not present

## 2012-12-15 DIAGNOSIS — S82109A Unspecified fracture of upper end of unspecified tibia, initial encounter for closed fracture: Secondary | ICD-10-CM | POA: Diagnosis not present

## 2012-12-15 DIAGNOSIS — R262 Difficulty in walking, not elsewhere classified: Secondary | ICD-10-CM | POA: Diagnosis not present

## 2012-12-17 DIAGNOSIS — R262 Difficulty in walking, not elsewhere classified: Secondary | ICD-10-CM | POA: Diagnosis not present

## 2012-12-17 DIAGNOSIS — S82109A Unspecified fracture of upper end of unspecified tibia, initial encounter for closed fracture: Secondary | ICD-10-CM | POA: Diagnosis not present

## 2012-12-20 DIAGNOSIS — S82109A Unspecified fracture of upper end of unspecified tibia, initial encounter for closed fracture: Secondary | ICD-10-CM | POA: Diagnosis not present

## 2012-12-20 DIAGNOSIS — R262 Difficulty in walking, not elsewhere classified: Secondary | ICD-10-CM | POA: Diagnosis not present

## 2013-01-19 DIAGNOSIS — H35059 Retinal neovascularization, unspecified, unspecified eye: Secondary | ICD-10-CM | POA: Diagnosis not present

## 2013-01-19 DIAGNOSIS — H353290 Exudative age-related macular degeneration, unspecified eye, stage unspecified: Secondary | ICD-10-CM | POA: Diagnosis not present

## 2013-01-19 DIAGNOSIS — H31019 Macula scars of posterior pole (postinflammatory) (post-traumatic), unspecified eye: Secondary | ICD-10-CM | POA: Diagnosis not present

## 2013-02-28 DIAGNOSIS — S60219A Contusion of unspecified wrist, initial encounter: Secondary | ICD-10-CM | POA: Diagnosis not present

## 2013-02-28 DIAGNOSIS — S40029A Contusion of unspecified upper arm, initial encounter: Secondary | ICD-10-CM | POA: Diagnosis not present

## 2013-02-28 DIAGNOSIS — M899 Disorder of bone, unspecified: Secondary | ICD-10-CM | POA: Diagnosis not present

## 2013-02-28 DIAGNOSIS — R7301 Impaired fasting glucose: Secondary | ICD-10-CM | POA: Diagnosis not present

## 2013-02-28 DIAGNOSIS — S40019A Contusion of unspecified shoulder, initial encounter: Secondary | ICD-10-CM | POA: Diagnosis not present

## 2013-02-28 DIAGNOSIS — E782 Mixed hyperlipidemia: Secondary | ICD-10-CM | POA: Diagnosis not present

## 2013-03-17 DIAGNOSIS — R7301 Impaired fasting glucose: Secondary | ICD-10-CM | POA: Diagnosis not present

## 2013-03-17 DIAGNOSIS — F329 Major depressive disorder, single episode, unspecified: Secondary | ICD-10-CM | POA: Diagnosis not present

## 2013-03-17 DIAGNOSIS — M899 Disorder of bone, unspecified: Secondary | ICD-10-CM | POA: Diagnosis not present

## 2013-03-17 DIAGNOSIS — M949 Disorder of cartilage, unspecified: Secondary | ICD-10-CM | POA: Diagnosis not present

## 2013-03-17 DIAGNOSIS — W19XXXA Unspecified fall, initial encounter: Secondary | ICD-10-CM | POA: Diagnosis not present

## 2013-03-17 DIAGNOSIS — E782 Mixed hyperlipidemia: Secondary | ICD-10-CM | POA: Diagnosis not present

## 2013-03-17 DIAGNOSIS — S40029A Contusion of unspecified upper arm, initial encounter: Secondary | ICD-10-CM | POA: Diagnosis not present

## 2013-03-17 DIAGNOSIS — Z1239 Encounter for other screening for malignant neoplasm of breast: Secondary | ICD-10-CM | POA: Diagnosis not present

## 2013-03-17 DIAGNOSIS — S40019A Contusion of unspecified shoulder, initial encounter: Secondary | ICD-10-CM | POA: Diagnosis not present

## 2013-03-18 ENCOUNTER — Other Ambulatory Visit: Payer: Self-pay | Admitting: Family Medicine

## 2013-03-18 DIAGNOSIS — Z1231 Encounter for screening mammogram for malignant neoplasm of breast: Secondary | ICD-10-CM

## 2013-04-13 ENCOUNTER — Ambulatory Visit
Admission: RE | Admit: 2013-04-13 | Discharge: 2013-04-13 | Disposition: A | Discharge status: Home / Self Care | Payer: Medicare Other | Source: Ambulatory Visit | Attending: Family Medicine | Admitting: Family Medicine

## 2013-04-13 DIAGNOSIS — Z1231 Encounter for screening mammogram for malignant neoplasm of breast: Secondary | ICD-10-CM

## 2013-05-04 DIAGNOSIS — H31019 Macula scars of posterior pole (postinflammatory) (post-traumatic), unspecified eye: Secondary | ICD-10-CM | POA: Diagnosis not present

## 2013-05-04 DIAGNOSIS — H353290 Exudative age-related macular degeneration, unspecified eye, stage unspecified: Secondary | ICD-10-CM | POA: Diagnosis not present

## 2013-05-04 DIAGNOSIS — H35059 Retinal neovascularization, unspecified, unspecified eye: Secondary | ICD-10-CM | POA: Diagnosis not present

## 2013-05-04 DIAGNOSIS — H26499 Other secondary cataract, unspecified eye: Secondary | ICD-10-CM | POA: Diagnosis not present

## 2013-08-02 DIAGNOSIS — Z23 Encounter for immunization: Secondary | ICD-10-CM | POA: Diagnosis not present

## 2013-08-17 DIAGNOSIS — H43819 Vitreous degeneration, unspecified eye: Secondary | ICD-10-CM | POA: Diagnosis not present

## 2013-08-17 DIAGNOSIS — H31019 Macula scars of posterior pole (postinflammatory) (post-traumatic), unspecified eye: Secondary | ICD-10-CM | POA: Diagnosis not present

## 2013-08-17 DIAGNOSIS — H35059 Retinal neovascularization, unspecified, unspecified eye: Secondary | ICD-10-CM | POA: Diagnosis not present

## 2013-08-17 DIAGNOSIS — H353290 Exudative age-related macular degeneration, unspecified eye, stage unspecified: Secondary | ICD-10-CM | POA: Diagnosis not present

## 2013-08-30 DIAGNOSIS — H35059 Retinal neovascularization, unspecified, unspecified eye: Secondary | ICD-10-CM | POA: Diagnosis not present

## 2013-08-30 DIAGNOSIS — H353290 Exudative age-related macular degeneration, unspecified eye, stage unspecified: Secondary | ICD-10-CM | POA: Diagnosis not present

## 2013-09-16 DIAGNOSIS — E782 Mixed hyperlipidemia: Secondary | ICD-10-CM | POA: Diagnosis not present

## 2013-09-16 DIAGNOSIS — F329 Major depressive disorder, single episode, unspecified: Secondary | ICD-10-CM | POA: Diagnosis not present

## 2013-09-16 DIAGNOSIS — N3946 Mixed incontinence: Secondary | ICD-10-CM | POA: Diagnosis not present

## 2013-09-16 DIAGNOSIS — R7301 Impaired fasting glucose: Secondary | ICD-10-CM | POA: Diagnosis not present

## 2013-09-16 DIAGNOSIS — E559 Vitamin D deficiency, unspecified: Secondary | ICD-10-CM | POA: Diagnosis not present

## 2013-09-16 DIAGNOSIS — M79609 Pain in unspecified limb: Secondary | ICD-10-CM | POA: Diagnosis not present

## 2013-09-16 DIAGNOSIS — M810 Age-related osteoporosis without current pathological fracture: Secondary | ICD-10-CM | POA: Diagnosis not present

## 2013-09-16 DIAGNOSIS — M899 Disorder of bone, unspecified: Secondary | ICD-10-CM | POA: Diagnosis not present

## 2013-09-20 DIAGNOSIS — M1710 Unilateral primary osteoarthritis, unspecified knee: Secondary | ICD-10-CM | POA: Diagnosis not present

## 2013-09-22 DIAGNOSIS — H409 Unspecified glaucoma: Secondary | ICD-10-CM | POA: Diagnosis not present

## 2013-09-22 DIAGNOSIS — H353190 Nonexudative age-related macular degeneration, unspecified eye, stage unspecified: Secondary | ICD-10-CM | POA: Diagnosis not present

## 2013-09-22 DIAGNOSIS — H35359 Cystoid macular degeneration, unspecified eye: Secondary | ICD-10-CM | POA: Diagnosis not present

## 2013-09-22 DIAGNOSIS — H31019 Macula scars of posterior pole (postinflammatory) (post-traumatic), unspecified eye: Secondary | ICD-10-CM | POA: Diagnosis not present

## 2013-09-22 DIAGNOSIS — Z961 Presence of intraocular lens: Secondary | ICD-10-CM | POA: Diagnosis not present

## 2013-09-22 DIAGNOSIS — H26499 Other secondary cataract, unspecified eye: Secondary | ICD-10-CM | POA: Diagnosis not present

## 2013-09-22 DIAGNOSIS — H4011X0 Primary open-angle glaucoma, stage unspecified: Secondary | ICD-10-CM | POA: Diagnosis not present

## 2013-09-22 DIAGNOSIS — H353290 Exudative age-related macular degeneration, unspecified eye, stage unspecified: Secondary | ICD-10-CM | POA: Diagnosis not present

## 2013-10-11 DIAGNOSIS — H353290 Exudative age-related macular degeneration, unspecified eye, stage unspecified: Secondary | ICD-10-CM | POA: Diagnosis not present

## 2013-10-11 DIAGNOSIS — H35059 Retinal neovascularization, unspecified, unspecified eye: Secondary | ICD-10-CM | POA: Diagnosis not present

## 2013-10-31 DIAGNOSIS — H26499 Other secondary cataract, unspecified eye: Secondary | ICD-10-CM | POA: Diagnosis not present

## 2013-11-07 ENCOUNTER — Other Ambulatory Visit: Payer: Self-pay | Admitting: Orthopedic Surgery

## 2013-11-14 ENCOUNTER — Other Ambulatory Visit (HOSPITAL_COMMUNITY): Payer: Self-pay | Admitting: Orthopedic Surgery

## 2013-11-14 NOTE — Patient Instructions (Addendum)
20 Jane CanaryBarbara A Vanhise  11/14/2013   Your procedure is scheduled on: Monday January 12th  Report to [facility] at  1045 AM.  Call this number if you have problems the morning of surgery [phone]   Remember:   Do not eat food  :After Midnight.   clear liquids midnight until 745 am day of sugrery, then nothing by mouth.   Take these medicines the morning of surgery with A SIP OF WATER: atorvastatin                                SEE [place] PREPARING FOR SURGERY SHEET             You may not have any metal on your body including hair pins and piercings  Do not wear jewelry, make-up.  Do not wear lotions, powders, or perfumes. You may wear deodorant.   Men may shave face and neck.  Do not bring valuables to the hospital. [facility] IS NOT RESPONSIBLE FOR VALUEABLES.  Contacts, dentures or bridgework may not be worn into surgery.  Leave suitcase in the car. After surgery it may be brought to your room.  For patients admitted to the hospital, checkout time is 11:00 AM the day of discharge.   Patients discharged the day of surgery will not be allowed to drive home.  Name and phone number of your driver:  Special Instructions: N/A   Please read over the following fact sheets that you were given: mrsa information, blood fact sheet, incentive spirometer sheet, clear liquid sheet, [place] preparing for surgery sheet  Call Cain SieveSharon Copeland Neisen RN pre op nurse if needed [phone]    FAILURE TO FOLLOW THESE INSTRUCTIONS MAY RESULT IN THE CANCELLATION OF YOUR SURGERY.  PATIENT SIGNATURE___________________________________________  NURSE SIGNATURE_____________________________________________

## 2013-11-15 ENCOUNTER — Encounter (HOSPITAL_COMMUNITY)
Admission: RE | Admit: 2013-11-15 | Discharge: 2013-11-15 | Disposition: A | Discharge status: Home / Self Care | Payer: Medicare Other | Source: Ambulatory Visit | Attending: Orthopedic Surgery | Admitting: Orthopedic Surgery

## 2013-11-15 ENCOUNTER — Encounter (HOSPITAL_COMMUNITY): Payer: Self-pay

## 2013-11-15 ENCOUNTER — Encounter (HOSPITAL_COMMUNITY): Payer: Self-pay | Admitting: Pharmacy Technician

## 2013-11-15 DIAGNOSIS — Z01818 Encounter for other preprocedural examination: Secondary | ICD-10-CM | POA: Insufficient documentation

## 2013-11-15 DIAGNOSIS — Z01812 Encounter for preprocedural laboratory examination: Secondary | ICD-10-CM | POA: Diagnosis not present

## 2013-11-15 HISTORY — DX: Unspecified osteoarthritis, unspecified site: M19.90

## 2013-11-15 HISTORY — DX: Hyperlipidemia, unspecified: E78.5

## 2013-11-15 LAB — CBC
HCT: 43.3 % (ref 36.0–46.0)
Hemoglobin: 14.1 g/dL (ref 12.0–15.0)
MCH: 30.2 pg (ref 26.0–34.0)
MCHC: 32.6 g/dL (ref 30.0–36.0)
MCV: 92.7 fL (ref 78.0–100.0)
Platelets: 259 10*3/uL (ref 150–400)
RBC: 4.67 MIL/uL (ref 3.87–5.11)
RDW: 13.1 % (ref 11.5–15.5)
WBC: 7.5 10*3/uL (ref 4.0–10.5)

## 2013-11-15 LAB — URINALYSIS, ROUTINE W REFLEX MICROSCOPIC
BILIRUBIN URINE: NEGATIVE
Glucose, UA: NEGATIVE mg/dL
Ketones, ur: NEGATIVE mg/dL
NITRITE: NEGATIVE
PH: 7 (ref 5.0–8.0)
Protein, ur: NEGATIVE mg/dL
SPECIFIC GRAVITY, URINE: 1.016 (ref 1.005–1.030)
Urobilinogen, UA: 0.2 mg/dL (ref 0.0–1.0)

## 2013-11-15 LAB — SURGICAL PCR SCREEN
MRSA, PCR: NEGATIVE
STAPHYLOCOCCUS AUREUS: NEGATIVE

## 2013-11-15 LAB — URINE MICROSCOPIC-ADD ON

## 2013-11-15 LAB — ABO/RH: ABO/RH(D): A POS

## 2013-11-15 LAB — COMPREHENSIVE METABOLIC PANEL
ALBUMIN: 3.7 g/dL (ref 3.5–5.2)
ALK PHOS: 81 U/L (ref 39–117)
ALT: 14 U/L (ref 0–35)
AST: 19 U/L (ref 0–37)
BILIRUBIN TOTAL: 0.4 mg/dL (ref 0.3–1.2)
BUN: 14 mg/dL (ref 6–23)
CHLORIDE: 101 meq/L (ref 96–112)
CO2: 30 meq/L (ref 19–32)
Calcium: 10 mg/dL (ref 8.4–10.5)
Creatinine, Ser: 0.68 mg/dL (ref 0.50–1.10)
GFR calc Af Amer: >90 mL/min (ref >90)
GFR, EST NON AFRICAN AMERICAN: 78 mL/min — AB (ref >90)
Glucose, Bld: 89 mg/dL (ref 70–99)
Potassium: 5.3 mEq/L (ref 3.7–5.3)
Sodium: 140 mEq/L (ref 137–147)
Total Protein: 7.6 g/dL (ref 6.0–8.3)

## 2013-11-15 LAB — APTT: APTT: 27 s (ref 24–37)

## 2013-11-15 LAB — PROTIME-INR
INR: 1.04 (ref 0.00–1.49)
PROTHROMBIN TIME: 13.4 s (ref 11.6–15.2)

## 2013-11-15 NOTE — Progress Notes (Signed)
Micro, ua results faxed by epic to dr aluisio 

## 2013-11-16 NOTE — Progress Notes (Signed)
Spoke with pt by phone pt aware surgery time changed to 1240, npo after midnight arrive 940 am 11-21-13 [facility], pt with ? Fungal infection under toenail to see dr Uvaldo Risingmcneil 11-17-13 and make sure ok for surgery, patient to call rn back 11-18-13 and advise regarding toenail and visit with dr Uvaldo Risingmcneil 11-17-13.

## 2013-11-16 NOTE — Progress Notes (Signed)
Fax received and placed on pt chart no action on ua results per dr Lequita Haltaluisio

## 2013-11-17 DIAGNOSIS — K5732 Diverticulitis of large intestine without perforation or abscess without bleeding: Secondary | ICD-10-CM | POA: Diagnosis not present

## 2013-11-17 DIAGNOSIS — B351 Tinea unguium: Secondary | ICD-10-CM | POA: Diagnosis not present

## 2013-11-17 DIAGNOSIS — F329 Major depressive disorder, single episode, unspecified: Secondary | ICD-10-CM | POA: Diagnosis not present

## 2013-11-17 DIAGNOSIS — M949 Disorder of cartilage, unspecified: Secondary | ICD-10-CM | POA: Diagnosis not present

## 2013-11-17 DIAGNOSIS — IMO0002 Reserved for concepts with insufficient information to code with codable children: Secondary | ICD-10-CM | POA: Diagnosis not present

## 2013-11-17 DIAGNOSIS — M1710 Unilateral primary osteoarthritis, unspecified knee: Secondary | ICD-10-CM | POA: Diagnosis not present

## 2013-11-17 DIAGNOSIS — E782 Mixed hyperlipidemia: Secondary | ICD-10-CM | POA: Diagnosis not present

## 2013-11-17 DIAGNOSIS — M899 Disorder of bone, unspecified: Secondary | ICD-10-CM | POA: Diagnosis not present

## 2013-11-17 DIAGNOSIS — E559 Vitamin D deficiency, unspecified: Secondary | ICD-10-CM | POA: Diagnosis not present

## 2013-11-18 NOTE — Progress Notes (Signed)
Spoke with pt by phone, pt saw dr Uvaldo Risingmcneil 11-17-12 and  Dr Uvaldo Risingmcneil said pt ok for surgery with toenail fungus.

## 2013-11-20 ENCOUNTER — Other Ambulatory Visit: Payer: Self-pay | Admitting: Surgical

## 2013-11-20 NOTE — H&P (Signed)
TOTAL KNEE ADMISSION H&P  Patient is being admitted for right total knee arthroplasty.  Subjective:  Chief Complaint:right knee pain.  HPI: Gabrielle [name], 80 y.o. female, has a history of pain and functional disability in the right knee due to trauma and arthritis and has failed non-surgical conservative treatments for greater than 12 weeks to includeNSAID's and/or analgesics, corticosteriod injections, flexibility and strengthening excercises, use of assistive devices and activity modification.  Onset of symptoms was gradual, starting 9 years ago with gradually worsening course since that time. The patient noted no past surgery on the right knee(s).  Patient currently rates pain in the right knee(s) at 7 out of 10 with activity. Patient has night pain, worsening of pain with activity and weight bearing, pain that interferes with activities of daily living, pain with passive range of motion, crepitus and joint swelling.  Patient has evidence of periarticular osteophytes and joint space narrowing by imaging studies. This patient has had proximal tibial fracture. There is no active infection.   Past Medical History  Diagnosis Date  . Arthritis     oa  . Hyperlipidemia     Past Surgical History  Procedure Laterality Date  . Tonsillectomy  age 89  . Wrist fracture surgery Right   . Benign breast tumor removed  age [age]  . Abdominal hysterectomy       Current outpatient prescriptions: alendronate (FOSAMAX) 70 MG tablet, Take 70 mg by mouth daily with supper. Take with a full glass of water on an empty stomach., Disp: , Rfl: ;   aspirin EC 81 MG tablet, Take 81 mg by mouth 3 (three) times a week., Disp: , Rfl: ;   atorvastatin (LIPITOR) 20 MG tablet, Take 20 mg by mouth daily., Disp: , Rfl: ;   Cholecalciferol (VITAMIN D3) 2000 UNITS TABS, Take 2,000 Units by mouth daily., Disp: , Rfl:  citalopram (CELEXA) 20 MG tablet, Take 20 mg by mouth daily with supper., Disp: , Rfl: ;   Omega-3  Fatty Acids (FISH OIL PO), Take 1 tablet by mouth daily., Disp: , Rfl: ;   psyllium (METAMUCIL) 58.6 % packet, Take 1 packet by mouth daily., Disp: , Rfl:   No Known Allergies  History  Substance Use Topics  . Smoking status: Former Smoker -- 1.00 packs/day for 17 years    Types: Cigarettes    Quit date: 11/10/1965  . Smokeless tobacco: Never Used  . Alcohol Use: Yes     Comment: very occasional wine    Family History Father deceased age 78 due to cancer Mother deceased age 78 due to pneumonia  Review of Systems  Constitutional: Negative for fever, chills, weight loss, malaise/fatigue and diaphoresis.  HENT: Positive for hearing loss. Negative for congestion, ear discharge, ear pain, nosebleeds, sore throat and tinnitus.   Eyes: Positive for blurred vision. Negative for double vision, photophobia, pain, discharge and redness.  Respiratory: Positive for shortness of breath. Negative for cough, hemoptysis, sputum production, wheezing and stridor.        SOB on exertion  Cardiovascular: Negative.   Gastrointestinal: Negative.   Genitourinary: Positive for frequency. Negative for dysuria, urgency, hematuria and flank pain.  Musculoskeletal: Positive for back pain and joint pain. Negative for falls, myalgias and neck pain.       Right knee pain  Skin: Positive for itching. Negative for rash.  Neurological: Positive for dizziness and weakness. Negative for tingling, tremors, sensory change, speech change, focal weakness, seizures, loss of consciousness and headaches.  Endo/Heme/Allergies:  Negative.   Psychiatric/Behavioral: Negative.     Objective:  Physical Exam  Constitutional: She is oriented to person, place, and time. She appears well-developed. No distress.  HENT:  Head: Normocephalic and atraumatic.  Right Ear: External ear normal.  Left Ear: External ear normal.  Nose: Nose normal.  Mouth/Throat: Oropharynx is clear and moist.  Eyes: Conjunctivae and EOM are normal.   Neck: Normal range of motion. Neck supple.  Cardiovascular: Normal rate, regular rhythm, normal heart sounds and intact distal pulses.   No murmur heard. Respiratory: Effort normal and breath sounds normal. No respiratory distress. She has no wheezes.  GI: Soft. Bowel sounds are normal. She exhibits no distension. There is no tenderness.  Musculoskeletal:       Right hip: Normal.       Left hip: Normal.       Right knee: She exhibits decreased range of motion and swelling. She exhibits no effusion and no erythema. Tenderness found. Medial joint line and lateral joint line tenderness noted.       Left knee: Normal.       Right lower leg: She exhibits no tenderness and no swelling.       Left lower leg: She exhibits no tenderness and no swelling.  Her right knee shows no effusion. Her range of motion is about 5 to 125. There is moderate crepitus on range of motion. There is tenderness lateral and medial. There is no instability noted.  Neurological: She is alert and oriented to person, place, and time. She has normal strength and normal reflexes. No sensory deficit.  Skin: No rash noted. She is not diaphoretic. No erythema.  Psychiatric: She has a normal mood and affect. Her behavior is normal.    Vitals Weight: 160 lb Height: 67.5 in Body Surface Area: 1.86 m Body Mass Index: 24.69 kg/m Pulse: 60 (Regular) BP: 116/72 (Sitting, Left Arm, Standard)  Imaging Review Plain radiographs demonstrate severe degenerative joint disease of the right knee(s). The overall alignment issignificant valgus. The bone quality appears to be good for age and reported activity level.  Assessment/Plan:  End stage arthritis, right knee   The patient history, physical examination, clinical judgment of the provider and imaging studies are consistent with end stage degenerative joint disease of the right knee(s) and total knee arthroplasty is deemed medically necessary. The treatment options  including medical management, injection therapy arthroscopy and arthroplasty were discussed at length. The risks and benefits of total knee arthroplasty were presented and reviewed. The risks due to aseptic loosening, infection, stiffness, patella tracking problems, thromboembolic complications and other imponderables were discussed. The patient acknowledged the explanation, agreed to proceed with the plan and consent was signed. Patient is being admitted for inpatient treatment for surgery, pain control, PT, OT, prophylactic antibiotics, VTE prophylaxis, progressive ambulation and ADL's and discharge planning. The patient is planning to be discharged to skilled nursing facility (home to [facility])     [place], [place]

## 2013-11-21 ENCOUNTER — Encounter (HOSPITAL_COMMUNITY)
Admission: RE | Disposition: A | Discharge status: Skilled Nursing Facility | Payer: Self-pay | Source: Ambulatory Visit | Attending: Orthopedic Surgery

## 2013-11-21 ENCOUNTER — Encounter (HOSPITAL_COMMUNITY): Payer: Medicare Other | Admitting: Anesthesiology

## 2013-11-21 ENCOUNTER — Encounter (HOSPITAL_COMMUNITY): Payer: Self-pay | Admitting: *Deleted

## 2013-11-21 ENCOUNTER — Inpatient Hospital Stay (HOSPITAL_COMMUNITY)
Admission: RE | Admit: 2013-11-21 | Discharge: 2013-11-24 | DRG: 470 | Disposition: A | Discharge status: Skilled Nursing Facility | Payer: Medicare Other | Source: Ambulatory Visit | Attending: Orthopedic Surgery | Admitting: Orthopedic Surgery

## 2013-11-21 ENCOUNTER — Inpatient Hospital Stay (HOSPITAL_COMMUNITY): Payer: Medicare Other | Admitting: Anesthesiology

## 2013-11-21 DIAGNOSIS — R269 Unspecified abnormalities of gait and mobility: Secondary | ICD-10-CM | POA: Diagnosis not present

## 2013-11-21 DIAGNOSIS — S99929A Unspecified injury of unspecified foot, initial encounter: Secondary | ICD-10-CM | POA: Diagnosis not present

## 2013-11-21 DIAGNOSIS — H409 Unspecified glaucoma: Secondary | ICD-10-CM | POA: Diagnosis not present

## 2013-11-21 DIAGNOSIS — Z96659 Presence of unspecified artificial knee joint: Secondary | ICD-10-CM | POA: Diagnosis not present

## 2013-11-21 DIAGNOSIS — E785 Hyperlipidemia, unspecified: Secondary | ICD-10-CM | POA: Diagnosis not present

## 2013-11-21 DIAGNOSIS — Z4789 Encounter for other orthopedic aftercare: Secondary | ICD-10-CM | POA: Diagnosis not present

## 2013-11-21 DIAGNOSIS — Z01812 Encounter for preprocedural laboratory examination: Secondary | ICD-10-CM

## 2013-11-21 DIAGNOSIS — IMO0002 Reserved for concepts with insufficient information to code with codable children: Secondary | ICD-10-CM | POA: Diagnosis not present

## 2013-11-21 DIAGNOSIS — Z87891 Personal history of nicotine dependence: Secondary | ICD-10-CM

## 2013-11-21 DIAGNOSIS — Z7982 Long term (current) use of aspirin: Secondary | ICD-10-CM

## 2013-11-21 DIAGNOSIS — R5383 Other fatigue: Secondary | ICD-10-CM | POA: Diagnosis not present

## 2013-11-21 DIAGNOSIS — M179 Osteoarthritis of knee, unspecified: Secondary | ICD-10-CM | POA: Diagnosis present

## 2013-11-21 DIAGNOSIS — Z79899 Other long term (current) drug therapy: Secondary | ICD-10-CM | POA: Diagnosis not present

## 2013-11-21 DIAGNOSIS — S8990XA Unspecified injury of unspecified lower leg, initial encounter: Secondary | ICD-10-CM | POA: Diagnosis not present

## 2013-11-21 DIAGNOSIS — R279 Unspecified lack of coordination: Secondary | ICD-10-CM | POA: Diagnosis not present

## 2013-11-21 DIAGNOSIS — R5381 Other malaise: Secondary | ICD-10-CM | POA: Diagnosis not present

## 2013-11-21 DIAGNOSIS — M1710 Unilateral primary osteoarthritis, unspecified knee: Secondary | ICD-10-CM | POA: Diagnosis not present

## 2013-11-21 DIAGNOSIS — M25569 Pain in unspecified knee: Secondary | ICD-10-CM | POA: Diagnosis not present

## 2013-11-21 DIAGNOSIS — M6281 Muscle weakness (generalized): Secondary | ICD-10-CM | POA: Diagnosis not present

## 2013-11-21 DIAGNOSIS — Z96651 Presence of right artificial knee joint: Secondary | ICD-10-CM

## 2013-11-21 HISTORY — PX: TOTAL KNEE ARTHROPLASTY: SHX125

## 2013-11-21 LAB — TYPE AND SCREEN
ABO/RH(D): A POS
Antibody Screen: NEGATIVE

## 2013-11-21 SURGERY — ARTHROPLASTY, KNEE, TOTAL
Anesthesia: Spinal | Site: Knee | Laterality: Right

## 2013-11-21 MED ORDER — PROPOFOL 10 MG/ML IV BOLUS
INTRAVENOUS | Status: AC
  Filled 2013-11-21: qty 20

## 2013-11-21 MED ORDER — SODIUM CHLORIDE 0.9 % IJ SOLN
INTRAMUSCULAR | Status: AC
  Filled 2013-11-21: qty 50

## 2013-11-21 MED ORDER — CITALOPRAM HYDROBROMIDE 20 MG PO TABS
20.0000 mg | ORAL_TABLET | Freq: Every day | ORAL | Status: DC
  Administered 2013-11-21 – 2013-11-23 (×3): 20 mg via ORAL
  Filled 2013-11-21 (×4): qty 1

## 2013-11-21 MED ORDER — ACETAMINOPHEN 500 MG PO TABS
1000.0000 mg | ORAL_TABLET | Freq: Four times a day (QID) | ORAL | Status: AC
  Administered 2013-11-21 – 2013-11-22 (×4): 1000 mg via ORAL
  Filled 2013-11-21 (×5): qty 2

## 2013-11-21 MED ORDER — KETOROLAC TROMETHAMINE 15 MG/ML IJ SOLN: 7.5000 mg | Freq: Four times a day (QID) | INTRAMUSCULAR | Status: AC | PRN

## 2013-11-21 MED ORDER — KETAMINE HCL 10 MG/ML IJ SOLN
INTRAMUSCULAR | Status: DC | PRN
Start: 2013-11-21 — End: 2013-11-21
  Administered 2013-11-21 (×4): 10 mg via INTRAVENOUS

## 2013-11-21 MED ORDER — DEXAMETHASONE SODIUM PHOSPHATE 10 MG/ML IJ SOLN
10.0000 mg | Freq: Once | INTRAMUSCULAR | Status: AC
  Administered 2013-11-21: 10 mg via INTRAVENOUS

## 2013-11-21 MED ORDER — BISACODYL 10 MG RE SUPP: 10.0000 mg | Freq: Every day | RECTAL | Status: DC | PRN

## 2013-11-21 MED ORDER — LACTATED RINGERS IV SOLN
INTRAVENOUS | Status: DC | PRN
  Administered 2013-11-21 (×2): via INTRAVENOUS

## 2013-11-21 MED ORDER — BUPIVACAINE HCL (PF) 0.25 % IJ SOLN
INTRAMUSCULAR | Status: AC
  Filled 2013-11-21: qty 30

## 2013-11-21 MED ORDER — SODIUM CHLORIDE 0.9 % IR SOLN
Status: DC | PRN
  Administered 2013-11-21: 1000 mL

## 2013-11-21 MED ORDER — RIVAROXABAN 10 MG PO TABS
10.0000 mg | ORAL_TABLET | Freq: Every day | ORAL | Status: DC
  Administered 2013-11-22 – 2013-11-24 (×3): 10 mg via ORAL
  Filled 2013-11-21 (×4): qty 1

## 2013-11-21 MED ORDER — MENTHOL 3 MG MT LOZG: 1.0000 | LOZENGE | OROMUCOSAL | Status: DC | PRN

## 2013-11-21 MED ORDER — LIDOCAINE HCL (CARDIAC) 20 MG/ML IV SOLN
INTRAVENOUS | Status: AC
  Filled 2013-11-21: qty 5

## 2013-11-21 MED ORDER — PSYLLIUM 95 % PO PACK
1.0000 | PACK | Freq: Every day | ORAL | Status: DC
  Administered 2013-11-22 – 2013-11-24 (×3): 1 via ORAL
  Filled 2013-11-21 (×3): qty 1

## 2013-11-21 MED ORDER — ATORVASTATIN CALCIUM 20 MG PO TABS
20.0000 mg | ORAL_TABLET | Freq: Every day | ORAL | Status: DC
  Administered 2013-11-22 – 2013-11-24 (×3): 20 mg via ORAL
  Filled 2013-11-21 (×3): qty 1

## 2013-11-21 MED ORDER — PROPOFOL INFUSION 10 MG/ML OPTIME
INTRAVENOUS | Status: DC | PRN
  Administered 2013-11-21: 75 ug/kg/min via INTRAVENOUS

## 2013-11-21 MED ORDER — CEFAZOLIN SODIUM-DEXTROSE 2-3 GM-% IV SOLR
INTRAVENOUS | Status: AC
  Filled 2013-11-21: qty 50

## 2013-11-21 MED ORDER — METOCLOPRAMIDE HCL 10 MG PO TABS: 5.0000 mg | ORAL_TABLET | Freq: Three times a day (TID) | ORAL | Status: DC | PRN

## 2013-11-21 MED ORDER — ONDANSETRON HCL 4 MG/2ML IJ SOLN
INTRAMUSCULAR | Status: DC | PRN
  Administered 2013-11-21: 4 mg via INTRAVENOUS

## 2013-11-21 MED ORDER — TIMOLOL HEMIHYDRATE 0.25 % OP SOLN: 1.0000 [drp] | Freq: Two times a day (BID) | OPHTHALMIC | Status: DC

## 2013-11-21 MED ORDER — FLEET ENEMA 7-19 GM/118ML RE ENEM: 1.0000 | ENEMA | Freq: Once | RECTAL | Status: AC | PRN

## 2013-11-21 MED ORDER — FENTANYL CITRATE 0.05 MG/ML IJ SOLN
INTRAMUSCULAR | Status: AC
  Filled 2013-11-21: qty 2

## 2013-11-21 MED ORDER — ACETAMINOPHEN 500 MG PO TABS
1000.0000 mg | ORAL_TABLET | Freq: Once | ORAL | Status: AC
  Administered 2013-11-21: 1000 mg via ORAL
  Filled 2013-11-21: qty 2

## 2013-11-21 MED ORDER — ONDANSETRON HCL 4 MG/2ML IJ SOLN
INTRAMUSCULAR | Status: AC
  Filled 2013-11-21: qty 2

## 2013-11-21 MED ORDER — 0.9 % SODIUM CHLORIDE (POUR BTL) OPTIME
TOPICAL | Status: DC | PRN
  Administered 2013-11-21: 1000 mL

## 2013-11-21 MED ORDER — TIMOLOL MALEATE 0.25 % OP SOLN
1.0000 [drp] | Freq: Two times a day (BID) | OPHTHALMIC | Status: DC
  Administered 2013-11-21 – 2013-11-24 (×6): 1 [drp] via OPHTHALMIC
  Filled 2013-11-21: qty 5

## 2013-11-21 MED ORDER — DEXTROSE-NACL 5-0.9 % IV SOLN
INTRAVENOUS | Status: DC
  Administered 2013-11-21: 17:00:00 via INTRAVENOUS

## 2013-11-21 MED ORDER — BUPIVACAINE LIPOSOME 1.3 % IJ SUSP
20.0000 mL | Freq: Once | INTRAMUSCULAR | Status: DC
Start: 2013-11-21 — End: 2013-11-21
  Filled 2013-11-21: qty 20

## 2013-11-21 MED ORDER — BUPIVACAINE HCL 0.25 % IJ SOLN
INTRAMUSCULAR | Status: DC | PRN
  Administered 2013-11-21: 20 mL

## 2013-11-21 MED ORDER — DIPHENHYDRAMINE HCL 12.5 MG/5ML PO ELIX: 12.5000 mg | ORAL_SOLUTION | ORAL | Status: DC | PRN

## 2013-11-21 MED ORDER — LACTATED RINGERS IV SOLN
INTRAVENOUS | Status: DC
  Administered 2013-11-21: 1000 mL via INTRAVENOUS

## 2013-11-21 MED ORDER — CEFAZOLIN SODIUM-DEXTROSE 2-3 GM-% IV SOLR
2.0000 g | INTRAVENOUS | Status: AC
  Administered 2013-11-21: 2 g via INTRAVENOUS

## 2013-11-21 MED ORDER — FENTANYL CITRATE 0.05 MG/ML IJ SOLN
25.0000 ug | INTRAMUSCULAR | Status: DC | PRN
  Administered 2013-11-21 (×2): 25 ug via INTRAVENOUS

## 2013-11-21 MED ORDER — MIDAZOLAM HCL 2 MG/2ML IJ SOLN
INTRAMUSCULAR | Status: AC
  Filled 2013-11-21: qty 2

## 2013-11-21 MED ORDER — ONDANSETRON HCL 4 MG/2ML IJ SOLN: 4.0000 mg | Freq: Four times a day (QID) | INTRAMUSCULAR | Status: DC | PRN

## 2013-11-21 MED ORDER — ACETAMINOPHEN 325 MG PO TABS: 650.0000 mg | ORAL_TABLET | Freq: Four times a day (QID) | ORAL | Status: DC | PRN

## 2013-11-21 MED ORDER — METOCLOPRAMIDE HCL 5 MG/ML IJ SOLN: 5.0000 mg | Freq: Three times a day (TID) | INTRAMUSCULAR | Status: DC | PRN

## 2013-11-21 MED ORDER — OXYCODONE HCL 5 MG PO TABS
5.0000 mg | ORAL_TABLET | ORAL | Status: DC | PRN
  Administered 2013-11-21: 10 mg via ORAL
  Administered 2013-11-21 – 2013-11-22 (×5): 5 mg via ORAL
  Filled 2013-11-21 (×2): qty 1
  Filled 2013-11-21: qty 2
  Filled 2013-11-21 (×3): qty 1

## 2013-11-21 MED ORDER — POLYETHYLENE GLYCOL 3350 17 G PO PACK
17.0000 g | PACK | Freq: Every day | ORAL | Status: DC | PRN
Start: 2013-11-21 — End: 2013-11-24
  Administered 2013-11-24: 17 g via ORAL

## 2013-11-21 MED ORDER — DEXAMETHASONE SODIUM PHOSPHATE 10 MG/ML IJ SOLN
10.0000 mg | Freq: Every day | INTRAMUSCULAR | Status: AC
  Filled 2013-11-21: qty 1

## 2013-11-21 MED ORDER — TRANEXAMIC ACID 100 MG/ML IV SOLN
1000.0000 mg | INTRAVENOUS | Status: AC
  Administered 2013-11-21: 1000 mg via INTRAVENOUS
  Filled 2013-11-21: qty 10

## 2013-11-21 MED ORDER — TRAMADOL HCL 50 MG PO TABS: 50.0000 mg | ORAL_TABLET | Freq: Four times a day (QID) | ORAL | Status: DC | PRN

## 2013-11-21 MED ORDER — BUPIVACAINE LIPOSOME 1.3 % IJ SUSP
INTRAMUSCULAR | Status: DC | PRN
  Administered 2013-11-21: 14:00:00

## 2013-11-21 MED ORDER — ACETAMINOPHEN 650 MG RE SUPP: 650.0000 mg | Freq: Four times a day (QID) | RECTAL | Status: DC | PRN

## 2013-11-21 MED ORDER — METHOCARBAMOL 100 MG/ML IJ SOLN
500.0000 mg | Freq: Four times a day (QID) | INTRAVENOUS | Status: DC | PRN
  Administered 2013-11-21: 500 mg via INTRAVENOUS
  Filled 2013-11-21: qty 5

## 2013-11-21 MED ORDER — CEFAZOLIN SODIUM-DEXTROSE 2-3 GM-% IV SOLR
2.0000 g | Freq: Four times a day (QID) | INTRAVENOUS | Status: AC
Start: 2013-11-21 — End: 2013-11-22
  Administered 2013-11-21 – 2013-11-22 (×2): 2 g via INTRAVENOUS
  Filled 2013-11-21 (×2): qty 50

## 2013-11-21 MED ORDER — PROMETHAZINE HCL 25 MG/ML IJ SOLN: 6.2500 mg | INTRAMUSCULAR | Status: DC | PRN

## 2013-11-21 MED ORDER — DEXAMETHASONE SODIUM PHOSPHATE 10 MG/ML IJ SOLN
INTRAMUSCULAR | Status: AC
  Filled 2013-11-21: qty 1

## 2013-11-21 MED ORDER — MORPHINE SULFATE 2 MG/ML IJ SOLN: 1.0000 mg | INTRAMUSCULAR | Status: DC | PRN

## 2013-11-21 MED ORDER — BUPIVACAINE IN DEXTROSE 0.75-8.25 % IT SOLN
INTRATHECAL | Status: DC | PRN
  Administered 2013-11-21: 1.5 mL via INTRATHECAL

## 2013-11-21 MED ORDER — DOCUSATE SODIUM 100 MG PO CAPS
100.0000 mg | ORAL_CAPSULE | Freq: Two times a day (BID) | ORAL | Status: DC
  Administered 2013-11-21 – 2013-11-24 (×6): 100 mg via ORAL

## 2013-11-21 MED ORDER — MIDAZOLAM HCL 5 MG/5ML IJ SOLN
INTRAMUSCULAR | Status: DC | PRN
  Administered 2013-11-21: 2 mg via INTRAVENOUS

## 2013-11-21 MED ORDER — MEPERIDINE HCL 50 MG/ML IJ SOLN: 6.2500 mg | INTRAMUSCULAR | Status: DC | PRN

## 2013-11-21 MED ORDER — PHENOL 1.4 % MT LIQD: 1.0000 | OROMUCOSAL | Status: DC | PRN

## 2013-11-21 MED ORDER — ONDANSETRON HCL 4 MG PO TABS: 4.0000 mg | ORAL_TABLET | Freq: Four times a day (QID) | ORAL | Status: DC | PRN

## 2013-11-21 MED ORDER — SODIUM CHLORIDE 0.9 % IV SOLN: INTRAVENOUS | Status: DC

## 2013-11-21 MED ORDER — METHOCARBAMOL 500 MG PO TABS
500.0000 mg | ORAL_TABLET | Freq: Four times a day (QID) | ORAL | Status: DC | PRN
  Administered 2013-11-23: 500 mg via ORAL
  Filled 2013-11-21: qty 1

## 2013-11-21 MED ORDER — DEXAMETHASONE 6 MG PO TABS
10.0000 mg | ORAL_TABLET | Freq: Every day | ORAL | Status: AC
  Administered 2013-11-22: 10 mg via ORAL
  Filled 2013-11-21: qty 1

## 2013-11-21 SURGICAL SUPPLY — 60 items
BAG SPEC THK2 15X12 ZIP CLS (MISCELLANEOUS) ×1
BAG ZIPLOCK 12X15 (MISCELLANEOUS) ×3 IMPLANT
BANDAGE ELASTIC 6 VELCRO ST LF (GAUZE/BANDAGES/DRESSINGS) ×3 IMPLANT
BANDAGE ESMARK 6X9 LF (GAUZE/BANDAGES/DRESSINGS) ×1 IMPLANT
BLADE SAG 18X100X1.27 (BLADE) ×3 IMPLANT
BLADE SAW SGTL 11.0X1.19X90.0M (BLADE) ×3 IMPLANT
BNDG CMPR 9X6 STRL LF SNTH (GAUZE/BANDAGES/DRESSINGS) ×1
BNDG ESMARK 6X9 LF (GAUZE/BANDAGES/DRESSINGS) ×3
BOWL SMART MIX CTS (DISPOSABLE) ×3 IMPLANT
CAPT RP KNEE ×2 IMPLANT
CEMENT HV SMART SET (Cement) ×6 IMPLANT
CLOSURE WOUND 1/2 X4 (GAUZE/BANDAGES/DRESSINGS) ×1
CUFF TOURN SGL QUICK 34 (TOURNIQUET CUFF) ×3
CUFF TRNQT CYL 34X4X40X1 (TOURNIQUET CUFF) ×1 IMPLANT
DECANTER SPIKE VIAL GLASS SM (MISCELLANEOUS) ×3 IMPLANT
DRAPE EXTREMITY T 121X128X90 (DRAPE) ×3 IMPLANT
DRAPE POUCH INSTRU U-SHP 10X18 (DRAPES) ×3 IMPLANT
DRAPE U-SHAPE 47X51 STRL (DRAPES) ×3 IMPLANT
DRSG ADAPTIC 3X8 NADH LF (GAUZE/BANDAGES/DRESSINGS) ×3 IMPLANT
DRSG PAD ABDOMINAL 8X10 ST (GAUZE/BANDAGES/DRESSINGS) ×2 IMPLANT
DURAPREP 26ML APPLICATOR (WOUND CARE) ×3 IMPLANT
ELECT REM PT RETURN 9FT ADLT (ELECTROSURGICAL) ×3
ELECTRODE REM PT RTRN 9FT ADLT (ELECTROSURGICAL) ×1 IMPLANT
EVACUATOR 1/8 PVC DRAIN (DRAIN) ×3 IMPLANT
FACESHIELD LNG OPTICON STERILE (SAFETY) ×15 IMPLANT
GLOVE BIO SURGEON STRL SZ7.5 (GLOVE) IMPLANT
GLOVE BIO SURGEON STRL SZ8 (GLOVE) ×3 IMPLANT
GLOVE BIOGEL PI IND STRL 8 (GLOVE) ×2 IMPLANT
GLOVE BIOGEL PI INDICATOR 8 (GLOVE) ×4
GLOVE SURG SS PI 6.5 STRL IVOR (GLOVE) IMPLANT
GOWN STRL REUS W/TWL LRG LVL3 (GOWN DISPOSABLE) ×3 IMPLANT
GOWN STRL REUS W/TWL XL LVL3 (GOWN DISPOSABLE) IMPLANT
HANDPIECE INTERPULSE COAX TIP (DISPOSABLE) ×3
IMMOBILIZER KNEE 20 (SOFTGOODS) ×6 IMPLANT
IMMOBILIZER KNEE 20 THIGH 36 (SOFTGOODS) IMPLANT
KIT BASIN OR (CUSTOM PROCEDURE TRAY) ×3 IMPLANT
MANIFOLD NEPTUNE II (INSTRUMENTS) ×3 IMPLANT
NDL SAFETY ECLIPSE 18X1.5 (NEEDLE) ×2 IMPLANT
NEEDLE HYPO 18GX1.5 SHARP (NEEDLE) ×6
NS IRRIG 1000ML POUR BTL (IV SOLUTION) ×3 IMPLANT
PACK TOTAL JOINT (CUSTOM PROCEDURE TRAY) ×3 IMPLANT
PAD ABD 8X10 STRL (GAUZE/BANDAGES/DRESSINGS) ×3 IMPLANT
PADDING CAST ABS 6INX4YD NS (CAST SUPPLIES) ×2
PADDING CAST ABS COTTON 6X4 NS (CAST SUPPLIES) IMPLANT
PADDING CAST COTTON 6X4 STRL (CAST SUPPLIES) ×9 IMPLANT
POSITIONER SURGICAL ARM (MISCELLANEOUS) ×3 IMPLANT
SET HNDPC FAN SPRY TIP SCT (DISPOSABLE) ×1 IMPLANT
SPONGE GAUZE 4X4 12PLY (GAUZE/BANDAGES/DRESSINGS) ×3 IMPLANT
STRIP CLOSURE SKIN 1/2X4 (GAUZE/BANDAGES/DRESSINGS) ×3 IMPLANT
SUCTION FRAZIER 12FR DISP (SUCTIONS) ×3 IMPLANT
SUT MNCRL AB 4-0 PS2 18 (SUTURE) ×3 IMPLANT
SUT VIC AB 2-0 CT1 27 (SUTURE) ×9
SUT VIC AB 2-0 CT1 TAPERPNT 27 (SUTURE) ×3 IMPLANT
SUT VLOC 180 0 24IN GS25 (SUTURE) ×3 IMPLANT
SYR 20CC LL (SYRINGE) ×3 IMPLANT
SYR 50ML LL SCALE MARK (SYRINGE) ×3 IMPLANT
TOWEL OR 17X26 10 PK STRL BLUE (TOWEL DISPOSABLE) ×6 IMPLANT
TRAY FOLEY CATH 14FRSI W/METER (CATHETERS) ×3 IMPLANT
WATER STERILE IRR 1500ML POUR (IV SOLUTION) ×3 IMPLANT
WRAP KNEE MAXI GEL POST OP (GAUZE/BANDAGES/DRESSINGS) ×3 IMPLANT

## 2013-11-21 NOTE — Anesthesia Preprocedure Evaluation (Addendum)
Anesthesia Evaluation  Patient identified by MRN, date of birth, ID band Patient awake    Reviewed: Allergy & Precautions, H&P , NPO status , Patient's Chart, lab work & pertinent test results  Airway Mallampati: II TM Distance: >3 FB Neck ROM: Full    Dental no notable dental hx.    Pulmonary neg pulmonary ROS, former smoker,  breath sounds clear to auscultation  Pulmonary exam normal       Cardiovascular negative cardio ROS  Rhythm:Regular Rate:Normal     Neuro/Psych negative neurological ROS  negative psych ROS   GI/Hepatic negative GI ROS, Neg liver ROS,   Endo/Other  negative endocrine ROS  Renal/GU negative Renal ROS  negative genitourinary   Musculoskeletal negative musculoskeletal ROS (+)   Abdominal   Peds negative pediatric ROS (+)  Hematology negative hematology ROS (+)   Anesthesia Other Findings Upper 2 front teeth capped  Reproductive/Obstetrics negative OB ROS                          Anesthesia Physical Anesthesia Plan  ASA: II  Anesthesia Plan: Spinal   Post-op Pain Management:    Induction:   Airway Management Planned: Simple Face Mask  Additional Equipment:   Intra-op Plan:   Post-operative Plan:   Informed Consent: I have reviewed the patients History and Physical, chart, labs and discussed the procedure including the risks, benefits and alternatives for the proposed anesthesia with the patient or authorized representative who has indicated his/her understanding and acceptance.   Dental advisory given  Plan Discussed with: CRNA  Anesthesia Plan Comments:         Anesthesia Quick Evaluation

## 2013-11-21 NOTE — Preoperative (Signed)
Beta Blockers   Reason not to administer Beta Blockers:Not Applicable 

## 2013-11-21 NOTE — Plan of Care (Signed)
Problem: Consults Goal: Diagnosis- Total Joint Replacement Primary Total Knee     

## 2013-11-21 NOTE — Interval H&P Note (Signed)
History and Physical Interval Note:  11/21/2013 12:42 PM  Gabrielle [name]  has presented today for surgery, with the diagnosis of OA OF RIGHT KNEE  The various methods of treatment have been discussed with the patient and family. After consideration of risks, benefits and other options for treatment, the patient has consented to  Procedure(s): RIGHT TOTAL KNEE ARTHROPLASTY (Right) as a surgical intervention .  The patient's history has been reviewed, patient examined, no change in status, stable for surgery.  I have reviewed the patient's chart and labs.  Questions were answered to the patient's satisfaction.     Loanne DrillingALUISIO,[name]

## 2013-11-21 NOTE — Transfer of Care (Signed)
Immediate Anesthesia Transfer of Care Note  Patient: Gabrielle CanaryBarbara A [name]  Procedure(s) Performed: Procedure(s): RIGHT TOTAL KNEE ARTHROPLASTY (Right)  Patient Location: PACU  Anesthesia Type:Spinal  Level of Consciousness: awake, alert  and oriented  Airway & Oxygen Therapy: Patient Spontanous Breathing and Patient connected to face mask oxygen  Post-op Assessment: Report given to PACU RN and Post -op Vital signs reviewed and stable  Post vital signs: Reviewed and stable  Complications: No apparent anesthesia complications

## 2013-11-21 NOTE — Anesthesia Procedure Notes (Signed)
Spinal Patient location during procedure: OR Staffing Anesthesiologist: Alianys Chacko Performed by: anesthesiologist  Preanesthetic Checklist Completed: patient identified, site marked, surgical consent, pre-op evaluation, timeout performed, IV checked, risks and benefits discussed and monitors and equipment checked Spinal Block Patient position: sitting Prep: Betadine Patient monitoring: heart rate, continuous pulse ox and blood pressure Approach: left paramedian Location: L3-4 Injection technique: single-shot Needle Needle type: Spinocan  Needle gauge: 22 G Needle length: 9 cm Additional Notes Expiration date of kit checked and confirmed. Patient tolerated procedure well, without complications.     

## 2013-11-21 NOTE — Anesthesia Postprocedure Evaluation (Signed)
  Anesthesia Post-op Note  Patient: Gabrielle [name]  Procedure(s) Performed: Procedure(s) (LRB): RIGHT TOTAL KNEE ARTHROPLASTY (Right)  Patient Location: PACU  Anesthesia Type: Spinal  Level of Consciousness: awake and alert   Airway and Oxygen Therapy: Patient Spontanous Breathing  Post-op Pain: mild  Post-op Assessment: Post-op Vital signs reviewed, Patient's Cardiovascular Status Stable, Respiratory Function Stable, Patent Airway and No signs of Nausea or vomiting  Last Vitals:  Filed Vitals:   11/21/13 1818  BP: 144/73  Pulse: 65  Temp: 36 C  Resp: 18    Post-op Vital Signs: stable   Complications: No apparent anesthesia complications

## 2013-11-21 NOTE — Op Note (Signed)
Pre-operative diagnosis- Osteoarthritis  Right knee(s)  Post-operative diagnosis- Osteoarthritis Right knee(s)  Procedure-  Right  Total Knee Arthroplasty  Surgeon- Gus RankinFrank V. Joory Gough, MD  Assistant- Avel Peacerew Perkins, PA-C   Anesthesia-  Spinal EBL-* No blood loss amount entered *  Drains Hemovac  Tourniquet time-  34 minutes @ 300 mm Hg  Complications- None  Condition-PACU - hemodynamically stable.   Brief Clinical Note  Gabrielle [name] is a 80 y.o. year old female with end stage OA of her right knee with progressively worsening pain and dysfunction. She has constant pain, with activity and at rest and significant functional deficits with difficulties even with ADLs. She has had extensive non-op management including analgesics, injections of cortisone and viscosupplements, and home exercise program, but remains in significant pain with significant dysfunction.Radiographs show bone on bone arthritis medial and patellofemoral. She presents now for right Total Knee Arthroplasty.    Procedure in detail---   The patient is brought into the operating room and positioned supine on the operating table. After successful administration of  Spinal,   a tourniquet is placed high on the  Right thigh(s) and the lower extremity is prepped and draped in the usual sterile fashion. Time out is performed by the operating team and then the  Right lower extremity is wrapped in Esmarch, knee flexed and the tourniquet inflated to 300 mmHg.       A midline incision is made with a ten blade through the subcutaneous tissue to the level of the extensor mechanism. A fresh blade is used to make a medial parapatellar arthrotomy. Soft tissue over the proximal medial tibia is subperiosteally elevated to the joint line with a knife and into the semimembranosus bursa with a Cobb elevator. Soft tissue over the proximal lateral tibia is elevated with attention being paid to avoiding the patellar tendon on the tibial tubercle.  The patella is everted, knee flexed 90 degrees and the ACL and PCL are removed. Findings are bone on bone medial and patellofemoral with large global osteophytes.        The drill is used to create a starting hole in the distal femur and the canal is thoroughly irrigated with sterile saline to remove the fatty contents. The 5 degree Right  valgus alignment guide is placed into the femoral canal and the distal femoral cutting block is pinned to remove 10 mm off the distal femur. Resection is made with an oscillating saw.      The tibia is subluxed forward and the menisci are removed. The extramedullary alignment guide is placed referencing proximally at the medial aspect of the tibial tubercle and distally along the second metatarsal axis and tibial crest. The block is pinned to remove 2mm off the more deficient medial  side. Resection is made with an oscillating saw. Size 4is the most appropriate size for the tibia and the proximal tibia is prepared with the modular drill and keel punch for that size.      The femoral sizing guide is placed and size 5 is most appropriate. Rotation is marked off the epicondylar axis and confirmed by creating a rectangular flexion gap at 90 degrees. The size 5 cutting block is pinned in this rotation and the anterior, posterior and chamfer cuts are made with the oscillating saw. The intercondylar block is then placed and that cut is made.      Trial size 4 tibial component, trial size 5 posterior stabilized femur and a 12.5  mm posterior stabilized rotating platform  insert trial is placed. Full extension is achieved with excellent varus/valgus and anterior/posterior balance throughout full range of motion. The patella is everted and thickness measured to be 22  mm. Free hand resection is taken to 12 mm, a 38 template is placed, lug holes are drilled, trial patella is placed, and it tracks normally. Osteophytes are removed off the posterior femur with the trial in place. All trials  are removed and the cut bone surfaces prepared with pulsatile lavage. Cement is mixed and once ready for implantation, the size 4 tibial implant, size  5 posterior stabilized femoral component, and the size 38 patella are cemented in place and the patella is held with the clamp. The trial insert is placed and the knee held in full extension. The Exparel (20 ml mixed with 30 ml saline) and .25% Bupivicaine, are injected into the extensor mechanism, posterior capsule, medial and lateral gutters and subcutaneous tissues.  All extruded cement is removed and once the cement is hard the permanent 12.5 mm posterior stabilized rotating platform insert is placed into the tibial tray.      The wound is copiously irrigated with saline solution and the extensor mechanism closed over a hemovac drain with #1 PDS suture. The tourniquet is released for a total tourniquet time of 34  minutes. Flexion against gravity is 140 degrees and the patella tracks normally. Subcutaneous tissue is closed with 2.0 vicryl and subcuticular with running 4.0 Monocryl. The incision is cleaned and dried and steri-strips and a bulky sterile dressing are applied. The limb is placed into a knee immobilizer and the patient is awakened and transported to recovery in stable condition.      Please note that a surgical assistant was a medical necessity for this procedure in order to perform it in a safe and expeditious manner. Surgical assistant was necessary to retract the ligaments and vital neurovascular structures to prevent injury to them and also necessary for proper positioning of the limb to allow for anatomic placement of the prosthesis.   Gus Rankin Warden Buffa, MD    11/21/2013, 1:50 PM

## 2013-11-22 ENCOUNTER — Encounter (HOSPITAL_COMMUNITY): Payer: Self-pay | Admitting: Orthopedic Surgery

## 2013-11-22 LAB — CBC
HCT: 39.5 % (ref 36.0–46.0)
Hemoglobin: 12.8 g/dL (ref 12.0–15.0)
MCH: 30 pg (ref 26.0–34.0)
MCHC: 32.4 g/dL (ref 30.0–36.0)
MCV: 92.5 fL (ref 78.0–100.0)
Platelets: 227 10*3/uL (ref 150–400)
RBC: 4.27 MIL/uL (ref 3.87–5.11)
RDW: 12.9 % (ref 11.5–15.5)
WBC: 20.1 10*3/uL — ABNORMAL HIGH (ref 4.0–10.5)

## 2013-11-22 LAB — BASIC METABOLIC PANEL
BUN: 9 mg/dL (ref 6–23)
CALCIUM: 9.1 mg/dL (ref 8.4–10.5)
CO2: 29 mEq/L (ref 19–32)
Chloride: 102 mEq/L (ref 96–112)
Creatinine, Ser: 0.52 mg/dL (ref 0.50–1.10)
GFR calc Af Amer: >90 mL/min (ref >90)
GFR, EST NON AFRICAN AMERICAN: 85 mL/min — AB (ref >90)
Glucose, Bld: 129 mg/dL — ABNORMAL HIGH (ref 70–99)
Potassium: 4.3 mEq/L (ref 3.7–5.3)
SODIUM: 141 meq/L (ref 137–147)

## 2013-11-22 NOTE — Discharge Instructions (Addendum)
° °Dr. Frank Aluisio °Total Joint Specialist °[facility] °[address] °[place], [place] [address] °[phone] ° °TOTAL KNEE REPLACEMENT POSTOPERATIVE DIRECTIONS ° ° ° °Knee Rehabilitation, Guidelines Following Surgery  °Results after knee surgery are often greatly improved when you follow the exercise, range of motion and muscle strengthening exercises prescribed by your doctor. Safety measures are also important to protect the knee from further injury. Any time any of these exercises cause you to have increased pain or swelling in your knee joint, decrease the amount until you are comfortable again and slowly increase them. If you have problems or questions, call your caregiver or physical therapist for advice.  ° °HOME CARE INSTRUCTIONS  °Remove items at home which could result in a fall. This includes throw rugs or furniture in walking pathways.  °Continue medications as instructed at time of discharge. °You may have some home medications which will be placed on hold until you complete the course of blood thinner medication.  °You may start showering once you are discharged home but do not submerge the incision under water. Just pat the incision dry and apply a dry gauze dressing on daily. °Walk with walker as instructed.  °You may resume a sexual relationship in one month or when given the OK by  your doctor.  °· Use walker as long as suggested by your caregivers. °· Avoid periods of inactivity such as sitting longer than an hour when not asleep. This helps prevent blood clots.  °You may put full weight on your legs and walk as much as is comfortable.  °You may return to work once you are cleared by your doctor.  °Do not drive a car for 6 weeks or until released by you surgeon.  °· Do not drive while taking narcotics.  °Wear the elastic stockings for three weeks following surgery during the day but you may remove then at night. °Make sure you keep all of your appointments after your  operation with all of your doctors and caregivers. You should call the office at the above phone number and make an appointment for approximately two weeks after the date of your surgery. °Change the dressing daily and reapply a dry dressing each time. °Please pick up a stool softener and laxative for home use as long as you are requiring pain medications. °· Continue to use ice on the knee for pain and swelling from surgery. You may notice swelling that will progress down to the foot and ankle.  This is normal after surgery.  Elevate the leg when you are not up walking on it.   °It is important for you to complete the blood thinner medication as prescribed by your doctor. °· Continue to use the breathing machine which will help keep your temperature down.  It is common for your temperature to cycle up and down following surgery, especially at night when you are not up moving around and exerting yourself.  The breathing machine keeps your lungs expanded and your temperature down. ° °RANGE OF MOTION AND STRENGTHENING EXERCISES  °Rehabilitation of the knee is important following a knee injury or an operation. After just a few days of immobilization, the muscles of the thigh which control the knee become weakened and shrink (atrophy). Knee exercises are designed to build up the tone and strength of the thigh muscles and to improve knee motion. Often times heat used for twenty to thirty minutes before working out will loosen up your tissues and help with improving the   range of motion but do not use heat for the first two weeks following surgery. These exercises can be done on a training (exercise) mat, on the floor, on a table or on a bed. Use what ever works the best and is most comfortable for you Knee exercises include:  °Leg Lifts - While your knee is still immobilized in a splint or cast, you can do straight leg raises. Lift the leg to 60 degrees, hold for 3 sec, and slowly lower the leg. Repeat 10-20 times 2-3  times daily. Perform this exercise against resistance later as your knee gets better.  °Quad and Hamstring Sets - Tighten up the muscle on the front of the thigh (Quad) and hold for 5-10 sec. Repeat this 10-20 times hourly. Hamstring sets are done by pushing the foot backward against an object and holding for 5-10 sec. Repeat as with quad sets.  °A rehabilitation program following serious knee injuries can speed recovery and prevent re-injury in the future due to weakened muscles. Contact your doctor or a physical therapist for more information on knee rehabilitation.  ° °SKILLED REHAB INSTRUCTIONS: °If the patient is transferred to a skilled rehab facility following release from the hospital, a list of the current medications will be sent to the facility for the patient to continue.  When discharged from the skilled rehab facility, please have the facility set up the patient's Home Health Physical Therapy prior to being released. Also, the skilled facility will be responsible for providing the patient with their medications at time of release from the facility to include their pain medication, the muscle relaxants, and their blood thinner medication. If the patient is still at the rehab facility at time of the two week follow up appointment, the skilled rehab facility will also need to assist the patient in arranging follow up appointment in our office and any transportation needs. ° °MAKE SURE YOU:  °Understand these instructions.  °Will watch your condition.  °Will get help right away if you are not doing well or get worse.  ° ° °Pick up stool softner and laxative for home. °Do not submerge incision under water. °May shower. °Continue to use ice for pain and swelling from surgery. ° °Take Xarelto for two and a half more weeks, then discontinue Xarelto. °Once the patient has completed the Xarelto, they may resume the 81 mg Aspirin. ° °When discharged from the skilled rehab facility, please have the facility set up  the patient's Home Health Physical Therapy prior to being released.  Also provide the patient with their medications at time of release from the facility to include their pain medication, the muscle relaxants, and their blood thinner medication.  If the patient is still at the rehab facility at time of follow up appointment, please also assist the patient in arranging follow up appointment in our office and any transportation needs. ° ° °Information on my medicine - XARELTO® (Rivaroxaban) ° °This medication education was reviewed with me or my healthcare representative as part of my discharge preparation.  The pharmacist that spoke with me during my hospital stay was:  Williamson, Erin R, RPH ° °Why was Xarelto® prescribed for you? °Xarelto® was prescribed for you to reduce the risk of a blood clots forming after orthopedic surgery OR to reduce the risk of forming blood clots that cause a stroke if you have a medical condition called atrial fibrillation (a type of irregular heartbeat). ° °What do you need to know about xarelto® ? °Take your   Xarelto® ONCE DAILY at the same time every day with your evening meal. °If you have difficulty swallowing the tablet whole, you may crush it and mix in applesauce just prior to taking your dose. ° °Take Xarelto® exactly as prescribed by your doctor and DO NOT stop taking Xarelto® without talking to the doctor who prescribed the medication.  Stopping without other stroke or VTE prevention medication to take the place of Xarelto® may increase your risk of developing a new clot or stroke.  Refill your prescription before you run out. ° °After discharge, you should have regular check-up appointments with your healthcare provider that is prescribing your Xarelto®.  In the future your dose may need to be changed if your kidney function or weight changes by a significant amount. ° °What do you do if you miss a dose? °If you are taking Xarelto® ONCE DAILY and you miss a dose, take it as  soon as you remember on the same day then continue your regularly scheduled once daily regimen the next day. Do not take two doses of Xarelto® at the same time.  ° °Important Safety Information °A possible side effect of Xarelto® is bleeding. You should call your healthcare provider right away if you experience any of the following: °  Bleeding from an injury or your nose that does not stop. °  Unusual colored urine (red or dark brown) or unusual colored stools (red or black). °  Unusual bruising for unknown reasons. °  A serious fall or if you hit your head (even if there is no bleeding). ° °Some medicines may interact with Xarelto® and might increase your risk of bleeding while on Xarelto®. To help avoid this, consult your healthcare provider or pharmacist prior to using any new prescription or non-prescription medications, including herbals, vitamins, non-steroidal anti-inflammatory drugs (NSAIDs) and supplements. ° °This website has more information on Xarelto®: www.xarelto.com. ° °

## 2013-11-22 NOTE — Progress Notes (Signed)
   Subjective: 1 Day Post-Op Procedure(s) (LRB): RIGHT TOTAL KNEE ARTHROPLASTY (Right) Patient reports no pain thru the night.  She was able to get sleep. Patient seen in rounds with Dr. Lequita HaltAluisio. Patient is well, and has had no acute complaints or problems We will start therapy today.  [facility].  Objective: Vital signs in last 24 hours: Temp:  [96 F (35.6 C)-98.2 F (36.8 C)] 97.4 F (36.3 C) (01/13 0529) Pulse Rate:  [50-69] 63 (01/13 0529) Resp:  [12-19] 16 (01/13 0529) BP: (101-155)/(46-83) 108/63 mmHg (01/13 0529) SpO2:  [93 %-100 %] 98 % (01/13 0529) Weight:  [74.027 kg (163 lb 3.2 oz)] 74.027 kg (163 lb 3.2 oz) (01/12 1720)  Intake/Output from previous day:  Intake/Output Summary (Last 24 hours) at 11/22/13 0810 Last data filed at 11/22/13 0645  Gross per 24 hour  Intake 3216.25 ml  Output   2054 ml  Net 1162.25 ml    Intake/Output this shift: UOP 425 since MN +1162  Labs:  Recent Labs  11/22/13 0547  HGB 12.8    Recent Labs  11/22/13 0547  WBC 20.1*  RBC 4.27  HCT 39.5  PLT 227    Recent Labs  11/22/13 0547  NA 141  K 4.3  CL 102  CO2 29  BUN 9  CREATININE 0.52  GLUCOSE 129*  CALCIUM 9.1   No results found for this basename: LABPT, INR,  in the last 72 hours  EXAM General - Patient is Alert, Appropriate and Oriented Extremity - Neurovascular intact Sensation intact distally Dressing - dressing C/D/I Motor Function - intact, moving foot and toes well on exam.  Hemovac pulled without difficulty.  Past Medical History  Diagnosis Date  . Arthritis     oa  . Hyperlipidemia     Assessment/Plan: 1 Day Post-Op Procedure(s) (LRB): RIGHT TOTAL KNEE ARTHROPLASTY (Right) Principal Problem:   OA (osteoarthritis) of knee  Estimated body mass index is 25.17 kg/(m^2) as calculated from the following:   Height as of this encounter: 5' 7.5" (1.715 m).   Weight as of this encounter: 74.027 kg (163 lb  3.2 oz). Advance diet Up with therapy D/C IV fluids - IV came out already Discharge to SNF  DVT Prophylaxis - Xarelto Weight-Bearing as tolerated to right leg D/C O2 and Pulse OX and try on Room Air  Yazir Koerber 11/22/2013, 8:10 AM

## 2013-11-22 NOTE — Progress Notes (Signed)
Pt denies any pain or discomfort at this time. Ambulated in hall with PT without difficulty. Premedicated with PRN pain med prior to physical therapy. Hemovac pulled this am by MD. No bleeding/drainage noted, ice packs to right knee.

## 2013-11-22 NOTE — Progress Notes (Signed)
OT Cancellation Note  Patient Details Name: Gabrielle [name] MRN: [id number] DOB: [birth year]   Cancelled Treatment:    Reason Eval/Treat Not Completed: Other (comment)  Noted pt plans snf for rehab.  Will defer OT eval to that venue.  Gabrielle [name] 11/22/2013, 10:59 AM Gabrielle [name], OTR/L [phone] 11/22/2013

## 2013-11-22 NOTE — Progress Notes (Signed)
Clinical Social Work Department BRIEF PSYCHOSOCIAL ASSESSMENT 11/22/2013  Patient:  Gabrielle [name], Gabrielle [name]     Account Number:  [id number]     Admit date:  11/21/2013  Clinical Social Worker:  Lacie Scotts  Date/Time:  11/22/2013 04:18 PM  Referred by:  Physician  Date Referred:  11/22/2013 Referred for  SNF Placement   Other Referral:   Interview type:  Patient Other interview type:    PSYCHOSOCIAL DATA Living Status:  FACILITY Admitted from facility:  [facility] Level of care:  Independent Living Primary support name:  Yarelly Kuba Primary support relationship to patient:   Degree of support available:   supportive    CURRENT CONCERNS Current Concerns  Post-Acute Placement   Other Concerns:    SOCIAL WORK ASSESSMENT / PLAN Pt is an 80 yr old female admitted from [facility]. CSW met with pt to assist with d/c planning. ST Rehab will be needed following hospital d/c. CSW has initiated SNF list and bed offers have been provided. Pt's niece will review options and CSW will meet with pt / family tomorrow to continue assisting with d/c planning.   Assessment/plan status:  Psychosocial Support/Ongoing Assessment of Needs Other assessment/ plan:   Information/referral to community resources:   SNF list provided. Insurance coverage for SNF and ambulance transport reviewed.    PATIENT'S/FAMILY'S RESPONSE TO PLAN OF CARE: " I don't think my doctor would want me to be home alone. I'll go to rehab for a short time. "     Werner Lean LCSW [phone]

## 2013-11-22 NOTE — Evaluation (Signed)
Physical Therapy Evaluation Patient Details Name: Gabrielle [name] MRN: [id number] DOB: 12-24-1926 Today's Date: 11/22/2013 Time: [id number] PT Time Calculation (min): 24 min  PT Assessment / Plan / Recommendation History of Present Illness   R TKA   Clinical Impression  Will benefit from PT to address deficits below    PT Assessment  Patient needs continued PT services    Follow Up Recommendations  SNF    Does the patient have the potential to tolerate intense rehabilitation      Barriers to Discharge        Equipment Recommendations  None recommended by PT    Recommendations for Other Services     Frequency 7X/week    Precautions / Restrictions Precautions Precautions: Knee Required Braces or Orthoses: Knee Immobilizer - Right Knee Immobilizer - Right: Discontinue once straight leg raise with < 10 degree lag   Pertinent Vitals/Pain Denies pain      Mobility  Bed Mobility Overal bed mobility: Needs Assistance Bed Mobility: Supine to Sit Supine to sit: Supervision General bed mobility comments: cues for technique Transfers Overall transfer level: Needs assistance Equipment used: Rolling walker (2 wheeled) Transfers: Sit to/from Stand Sit to Stand: Min assist;Min guard General transfer comment: cues for hand placement Ambulation/Gait Ambulation/Gait assistance: Min assist;Min guard Ambulation Distance (Feet): 120 Feet Assistive device: Rolling walker (2 wheeled) Gait Pattern/deviations: Step-to pattern    Exercises Total Joint Exercises Ankle Circles/Pumps: 10 reps;AROM;Both Quad Sets: AROM;Both;10 reps   PT Diagnosis: Difficulty walking  PT Problem List: Decreased strength;Decreased range of motion;Decreased activity tolerance;Decreased balance;Decreased mobility;Decreased knowledge of precautions PT Treatment Interventions: DME instruction;Gait training;Functional mobility training;Therapeutic activities;Therapeutic exercise;Patient/family  education     PT Goals(Current goals can be found in the care plan section) Acute Rehab PT Goals Patient Stated Goal: rehab probably PT Goal Formulation: With patient Time For Goal Achievement: 11/29/13 Potential to Achieve Goals: Good  Visit Information  Last PT Received On: 11/22/13 Assistance Needed: +1       Prior Functioning  Home Living Family/patient expects to be discharged to:: Skilled nursing facility Home Equipment: Dan HumphreysWalker - 2 wheels;Bedside commode Prior Function Level of Independence: Independent with assistive device(s);Independent Communication Communication: No difficulties    Cognition  Cognition Arousal/Alertness: Awake/alert Behavior During Therapy: WFL for tasks assessed/performed Overall Cognitive Status: Within Functional Limits for tasks assessed    Extremity/Trunk Assessment Upper Extremity Assessment Upper Extremity Assessment: Defer to OT evaluation Lower Extremity Assessment Lower Extremity Assessment: RLE deficits/detail RLE Deficits / Details: able to do I SLR; ankle WFL   Balance    End of Session PT - End of Session Equipment Utilized During Treatment: Gait belt Activity Tolerance: Patient tolerated treatment well Patient left: in chair;with call bell/phone within reach CPM Right Knee CPM Right Knee: Off  GP     [facility]ILLIAMS,Simrat Kendrick 11/22/2013, 10:21 AM

## 2013-11-22 NOTE — Progress Notes (Signed)
Clinical Social Work Department CLINICAL SOCIAL WORK PLACEMENT NOTE 11/22/2013  Patient:  Gabrielle [name],Gabrielle [name]  Account Number:  [id number] Admit date:  11/21/2013  Clinical Social Worker:  Cori RazorJAMIE Jamier Urbas, LCSW  Date/time:  11/22/2013 04:25 PM  Clinical Social Work is seeking post-discharge placement for this patient at the following level of care:   SKILLED NURSING   (*CSW will update this form in Epic as items are completed)   11/22/2013  Patient/family provided with [facility] Department of Clinical Social Work's list of facilities offering this level of care within the geographic area requested by the patient (or if unable, by the patient's family).  11/22/2013  Patient/family informed of their freedom to choose among providers that offer the needed level of care, that participate in Medicare, Medicaid or managed care program needed by the patient, have an available bed and are willing to accept the patient.    Patient/family informed of [facility]' ownership interest in [facility], as well as of the fact that they are under no obligation to receive care at this facility.  PASARR submitted to EDS on 11/22/2013 PASARR number received from EDS on 11/22/2013  FL2 transmitted to all facilities in geographic area requested by pt/family on  11/22/2013 FL2 transmitted to all facilities within larger geographic area on   Patient informed that his/her managed care company has contracts with or will negotiate with  certain facilities, including the following:     Patient/family informed of bed offers received:  11/22/2013 Patient chooses bed at  Physician recommends and patient chooses bed at    Patient to be transferred to  on   Patient to be transferred to facility by   The following physician request were entered in Epic:   Additional Comments:  Cori RazorJamie Jaliana Medellin LCSW [phone]

## 2013-11-22 NOTE — Progress Notes (Signed)
Utilization review completed.  

## 2013-11-22 NOTE — Progress Notes (Signed)
11/22/13 1300  PT Visit Information  Last PT Received On 11/22/13  Assistance Needed +1  History of Present Illness s/p RTKA  PT Time Calculation  PT Start Time 1221  PT Stop Time 1234  PT Time Calculation (min) 13 min  Precautions  Precautions Knee  Restrictions  Weight Bearing Restrictions No  Other Position/Activity Restrictions WBAT RLE  Cognition  Arousal/Alertness Awake/alert  Behavior During Therapy WFL for tasks assessed/performed  Overall Cognitive Status Within Functional Limits for tasks assessed  Bed Mobility  Overal bed mobility Needs Assistance  Transfers  Overall transfer level Needs assistance  Equipment used Rolling walker (2 wheeled)  Transfers Sit to/from Stand  Sit to Stand Min guard;Supervision  General transfer comment cues for hand placement and RLE position  Ambulation/Gait  Ambulation/Gait assistance Supervision;Min guard  Ambulation Distance (Feet) 120 Feet  Assistive device Rolling walker (2 wheeled)  Gait Pattern/deviations Step-through pattern;Decreased stride length  General Gait Details cues for seqeunce, step through progression  Total Joint Exercises  Ankle Circles/Pumps 10 reps;AROM;Both  Quad Sets AROM;Both;10 reps  Heel Slides AROM;Right;10 reps;AAROM  Hip ABduction/ADduction AROM;AAROM;Right;10 reps  Straight Leg Raises AROM;Right;10 reps  PT - End of Session  Equipment Utilized During Treatment Gait belt  Activity Tolerance Patient tolerated treatment well  Patient left in chair;with call bell/phone within reach  Nurse Communication Mobility status  PT - Assessment/Plan  PT Plan Current plan remains appropriate  PT Frequency 7X/week  Follow Up Recommendations SNF  PT equipment None recommended by PT  PT Goal Progression  Progress towards PT goals Progressing toward goals  Acute Rehab PT Goals  PT Goal Formulation With patient  Time For Goal Achievement 11/29/13  Potential to Achieve Goals Good  PT General Charges  $$ ACUTE  PT VISIT 1 Procedure  PT Treatments  $Gait Training 8-22 mins

## 2013-11-23 LAB — BASIC METABOLIC PANEL
BUN: 12 mg/dL (ref 6–23)
CHLORIDE: 102 meq/L (ref 96–112)
CO2: 28 mEq/L (ref 19–32)
Calcium: 9.1 mg/dL (ref 8.4–10.5)
Creatinine, Ser: 0.56 mg/dL (ref 0.50–1.10)
GFR, EST NON AFRICAN AMERICAN: 83 mL/min — AB (ref >90)
Glucose, Bld: 126 mg/dL — ABNORMAL HIGH (ref 70–99)
POTASSIUM: 4.2 meq/L (ref 3.7–5.3)
Sodium: 138 mEq/L (ref 137–147)

## 2013-11-23 LAB — CBC
HCT: 34.6 % — ABNORMAL LOW (ref 36.0–46.0)
Hemoglobin: 11.2 g/dL — ABNORMAL LOW (ref 12.0–15.0)
MCH: 30.1 pg (ref 26.0–34.0)
MCHC: 32.4 g/dL (ref 30.0–36.0)
MCV: 93 fL (ref 78.0–100.0)
PLATELETS: 207 10*3/uL (ref 150–400)
RBC: 3.72 MIL/uL — ABNORMAL LOW (ref 3.87–5.11)
RDW: 13.2 % (ref 11.5–15.5)
WBC: 18.7 10*3/uL — AB (ref 4.0–10.5)

## 2013-11-23 MED ORDER — SODIUM CHLORIDE 0.9 % IJ SOLN
3.0000 mL | Freq: Two times a day (BID) | INTRAMUSCULAR | Status: DC
  Administered 2013-11-23: 22:00:00 3 mL via INTRAVENOUS

## 2013-11-23 NOTE — Care Management Note (Signed)
    Page 1 of 1   11/23/2013     1:33:53 PM   CARE MANAGEMENT NOTE 11/23/2013  Patient:  Gabrielle [name],Gabrielle [name]   Account Number:  [id number]  Date Initiated:  11/23/2013  Documentation initiated by:  Colleen CanMANNING,Aeriel Boulay  Subjective/Objective Assessment:   dx right total knee replacemnt     Action/Plan:   CM spoke with patient. Plans arefor SNF rehab.   Anticipated DC Date:  11/24/2013   Anticipated DC Plan:  SKILLED NURSING FACILITY  In-house referral  Clinical Social Worker      DC Planning Services  CM consult      Choice offered to / List presented to:             Status of service:  Completed, signed off Medicare Important Message given?   (If response is "NO", the following Medicare IM given date fields will be blank) Date Medicare IM given:   Date Additional Medicare IM given:    Discharge Disposition:  SKILLED NURSING FACILITY  Per UR Regulation:    If discussed at Long Length of Stay Meetings, dates discussed:    Comments:

## 2013-11-23 NOTE — Progress Notes (Signed)
   Subjective: 2 Days Post-Op Procedure(s) (LRB): RIGHT TOTAL KNEE ARTHROPLASTY (Right) Patient reports pain as mild and moderate last night Patient seen in rounds for Dr. Lequita HaltAluisio. Patient is well, but has had some minor complaints of pain in the knee, requiring pain medications Plan is to go Skilled nursing facility after hospital stay.  Objective: Vital signs in last 24 hours: Temp:  [98.3 F (36.8 C)-98.9 F (37.2 C)] 98.3 F (36.8 C) (01/14 1453) Pulse Rate:  [68-77] 77 (01/14 1453) Resp:  [16-18] 16 (01/14 1453) BP: (83-160)/(54-80) 160/62 mmHg (01/14 1453) SpO2:  [86 %-95 %] 95 % (01/14 1453)  Intake/Output from previous day:  Intake/Output Summary (Last 24 hours) at 11/23/13 1554 Last data filed at 11/23/13 0800  Gross per 24 hour  Intake    480 ml  Output      0 ml  Net    480 ml    Intake/Output this shift: Total I/O In: 240 [P.O.:240] Out: -   Labs:  Recent Labs  11/22/13 0547 11/23/13 0506  HGB 12.8 11.2*    Recent Labs  11/22/13 0547 11/23/13 0506  WBC 20.1* 18.7*  RBC 4.27 3.72*  HCT 39.5 34.6*  PLT 227 207    Recent Labs  11/22/13 0547 11/23/13 0506  NA 141 138  K 4.3 4.2  CL 102 102  CO2 29 28  BUN 9 12  CREATININE 0.52 0.56  GLUCOSE 129* 126*  CALCIUM 9.1 9.1   No results found for this basename: LABPT, INR,  in the last 72 hours  EXAM General - Patient is Alert and Appropriate Extremity - Neurovascular intact Sensation intact distally Dressing/Incision - clean, dry, no drainage Motor Function - intact, moving foot and toes well on exam.   Past Medical History  Diagnosis Date  . Arthritis     oa  . Hyperlipidemia     Assessment/Plan: 2 Days Post-Op Procedure(s) (LRB): RIGHT TOTAL KNEE ARTHROPLASTY (Right) Principal Problem:   OA (osteoarthritis) of knee  Estimated body mass index is 25.17 kg/(m^2) as calculated from the following:   Height as of this encounter: 5' 7.5" (1.715 m).   Weight as of this encounter:  74.027 kg (163 lb 3.2 oz). Up with therapy Discharge to SNF  DVT Prophylaxis - Xarelto Weight-Bearing as tolerated to right leg  Baylyn Sickles 11/23/2013, 3:54 PM

## 2013-11-23 NOTE — Progress Notes (Signed)
Physical Therapy Treatment Patient Details Name: Gabrielle [name] MRN: [id number] DOB: [birth year] Today's Date: 11/23/2013 Time: 1324-40101005-1043 PT Time Calculation (min): 38 min  PT Assessment / Plan / Recommendation  History of Present Illness s/p RTKA   PT Comments   **Progressing well with mobility. Pt reported muscle spasms in R quads, requested muscle relaxer.  Able to do SLR independently.   Follow Up Recommendations  SNF     Does the patient have the potential to tolerate intense rehabilitation     Barriers to Discharge        Equipment Recommendations  None recommended by PT    Recommendations for Other Services    Frequency 7X/week   Progress towards PT Goals Progress towards PT goals: Progressing toward goals  Plan Current plan remains appropriate    Precautions / Restrictions Precautions Precautions: Knee Required Braces or Orthoses: Knee Immobilizer - Right Knee Immobilizer - Right: Discontinue once straight leg raise with < 10 degree lag Restrictions Weight Bearing Restrictions: No Other Position/Activity Restrictions: WBAT RLE   Pertinent Vitals/Pain **2/10 R knee with walking Ice applied, muscle relaxer requested*    Mobility  Bed Mobility Overal bed mobility: Modified Independent Bed Mobility: Supine to Sit Supine to sit: Modified independent (Device/Increase time) General bed mobility comments: HOB elevated Transfers Overall transfer level: Needs assistance Equipment used: Rolling walker (2 wheeled) Sit to Stand: Min guard;Supervision General transfer comment: cues for hand placement and RLE position Ambulation/Gait Ambulation/Gait assistance: Supervision Ambulation Distance (Feet): 200 Feet Assistive device: Rolling walker (2 wheeled) Gait Pattern/deviations: Step-to pattern General Gait Details: VCs to lift head, steady, no LOB    Exercises Total Joint Exercises Ankle Circles/Pumps: 10 reps;AROM;Both Quad Sets: AROM;Both;10 reps Short  Arc Quad: AROM;Right;10 reps;Supine Heel Slides: AROM;Right;10 reps;AAROM;Supine Hip ABduction/ADduction: AROM;AAROM;Right;10 reps Straight Leg Raises: AROM;Right;10 reps;Supine Long Arc Quad: AROM;Right;5 reps;Seated Knee Flexion: AROM;Right;10 reps;Seated Goniometric ROM: 75* flexion AAROM R knee   PT Diagnosis:    PT Problem List:   PT Treatment Interventions:     PT Goals (current goals can now be found in the care plan section) Acute Rehab PT Goals Patient Stated Goal: get rid of walker, do activities at ALF PT Goal Formulation: With patient Time For Goal Achievement: 11/29/13 Potential to Achieve Goals: Good  Visit Information  Last PT Received On: 11/23/13 Assistance Needed: +1 History of Present Illness: s/p RTKA    Subjective Data  Patient Stated Goal: get rid of walker, do activities at ALF   Cognition  Cognition Arousal/Alertness: Awake/alert Behavior During Therapy: WFL for tasks assessed/performed Overall Cognitive Status: Within Functional Limits for tasks assessed    Balance     End of Session PT - End of Session Equipment Utilized During Treatment: Gait belt Activity Tolerance: Patient tolerated treatment well Patient left: in chair;with call bell/phone within reach Nurse Communication: Mobility status   GP     Ralene BatheUhlenberg, Aspin Palomarez Kistler 11/23/2013, 10:46 AM [phone]

## 2013-11-23 NOTE — Progress Notes (Signed)
Physical Therapy Treatment Patient Details Name: Ludwig LeanBarbara A Bissonette MRN: [id number] DOB: 1927-10-15 Today's Date: 11/23/2013 Time: [id number] PT Time Calculation (min): 24 min  PT Assessment / Plan / Recommendation  History of Present Illness s/p RTKA   PT Comments   **Pt became faint after walking 100'. BP 83/54, SaO2 86% on RA, HR 55. RN notified. Pt hadn't eaten lunch. Snack provided, lunch ordered. *  Follow Up Recommendations  SNF     Does the patient have the potential to tolerate intense rehabilitation     Barriers to Discharge        Equipment Recommendations  None recommended by PT    Recommendations for Other Services    Frequency 7X/week   Progress towards PT Goals Progress towards PT goals: Progressing toward goals  Plan Current plan remains appropriate    Precautions / Restrictions Precautions Precautions: Knee Required Braces or Orthoses: Knee Immobilizer - Right Knee Immobilizer - Right: Discontinue once straight leg raise with < 10 degree lag Restrictions Weight Bearing Restrictions: No Other Position/Activity Restrictions: WBAT RLE   Pertinent Vitals/Pain *2/10 R knee BP in sitting 83/54, SaO2 86% on RA, HR 55  -RN notified*    Mobility  Bed Mobility Overal bed mobility: Needs Assistance Bed Mobility: Sit to Supine Sit to supine: Min assist General bed mobility comments: min A for LEs into bed Transfers Overall transfer level: Needs assistance Equipment used: Rolling walker (2 wheeled) Sit to Stand: Min guard;Supervision General transfer comment: cues for hand placement and RLE position Ambulation/Gait Ambulation/Gait assistance: Min assist;Supervision Ambulation Distance (Feet): 100 Feet Assistive device: Rolling walker (2 wheeled) Gait Pattern/deviations: Step-through pattern General Gait Details: After walking 100' pt became light headed. Assisted pt to chair. BP 83/54. SaO2 86% on RA. Pt stated she didn't eat lunch. ASsisted pt back to  bed, got her a snack and ordered her a lunch. RN aware.    Exercises     PT Diagnosis:    PT Problem List:   PT Treatment Interventions:     PT Goals (current goals can now be found in the care plan section) Acute Rehab PT Goals Patient Stated Goal: get rid of walker, do activities at ALF PT Goal Formulation: With patient Time For Goal Achievement: 11/29/13 Potential to Achieve Goals: Good  Visit Information  Last PT Received On: 11/23/13 Assistance Needed: +1 History of Present Illness: s/p RTKA    Subjective Data  Patient Stated Goal: get rid of walker, do activities at ALF   Cognition  Cognition Arousal/Alertness: Awake/alert Behavior During Therapy: WFL for tasks assessed/performed Overall Cognitive Status: Within Functional Limits for tasks assessed    Balance     End of Session PT - End of Session Equipment Utilized During Treatment: Gait belt Activity Tolerance: Treatment limited secondary to medical complications (Comment) (dizzy, decr BP) Patient left: with call bell/phone within reach;in bed Nurse Communication: Mobility status   GP     Ralene BatheUhlenberg, Davier Tramell Kistler 11/23/2013, 2:57 PM [phone]

## 2013-11-24 DIAGNOSIS — F3289 Other specified depressive episodes: Secondary | ICD-10-CM | POA: Diagnosis not present

## 2013-11-24 DIAGNOSIS — R5381 Other malaise: Secondary | ICD-10-CM | POA: Diagnosis not present

## 2013-11-24 DIAGNOSIS — S99919A Unspecified injury of unspecified ankle, initial encounter: Secondary | ICD-10-CM | POA: Diagnosis not present

## 2013-11-24 DIAGNOSIS — M6281 Muscle weakness (generalized): Secondary | ICD-10-CM | POA: Diagnosis not present

## 2013-11-24 DIAGNOSIS — F329 Major depressive disorder, single episode, unspecified: Secondary | ICD-10-CM | POA: Diagnosis not present

## 2013-11-24 DIAGNOSIS — Z96659 Presence of unspecified artificial knee joint: Secondary | ICD-10-CM | POA: Diagnosis not present

## 2013-11-24 DIAGNOSIS — M1710 Unilateral primary osteoarthritis, unspecified knee: Secondary | ICD-10-CM | POA: Diagnosis not present

## 2013-11-24 DIAGNOSIS — R269 Unspecified abnormalities of gait and mobility: Secondary | ICD-10-CM | POA: Diagnosis not present

## 2013-11-24 DIAGNOSIS — D72829 Elevated white blood cell count, unspecified: Secondary | ICD-10-CM | POA: Diagnosis not present

## 2013-11-24 DIAGNOSIS — R5383 Other fatigue: Secondary | ICD-10-CM | POA: Diagnosis not present

## 2013-11-24 DIAGNOSIS — S8990XA Unspecified injury of unspecified lower leg, initial encounter: Secondary | ICD-10-CM | POA: Diagnosis not present

## 2013-11-24 DIAGNOSIS — E7800 Pure hypercholesterolemia, unspecified: Secondary | ICD-10-CM | POA: Diagnosis not present

## 2013-11-24 DIAGNOSIS — D62 Acute posthemorrhagic anemia: Secondary | ICD-10-CM | POA: Diagnosis not present

## 2013-11-24 DIAGNOSIS — E119 Type 2 diabetes mellitus without complications: Secondary | ICD-10-CM | POA: Diagnosis not present

## 2013-11-24 DIAGNOSIS — E785 Hyperlipidemia, unspecified: Secondary | ICD-10-CM | POA: Diagnosis not present

## 2013-11-24 DIAGNOSIS — IMO0002 Reserved for concepts with insufficient information to code with codable children: Secondary | ICD-10-CM | POA: Diagnosis not present

## 2013-11-24 DIAGNOSIS — Z4789 Encounter for other orthopedic aftercare: Secondary | ICD-10-CM | POA: Diagnosis not present

## 2013-11-24 DIAGNOSIS — K5900 Constipation, unspecified: Secondary | ICD-10-CM | POA: Diagnosis not present

## 2013-11-24 DIAGNOSIS — H409 Unspecified glaucoma: Secondary | ICD-10-CM | POA: Diagnosis not present

## 2013-11-24 DIAGNOSIS — R279 Unspecified lack of coordination: Secondary | ICD-10-CM | POA: Diagnosis not present

## 2013-11-24 DIAGNOSIS — M25569 Pain in unspecified knee: Secondary | ICD-10-CM | POA: Diagnosis not present

## 2013-11-24 LAB — CBC
HCT: 31.6 % — ABNORMAL LOW (ref 36.0–46.0)
HEMOGLOBIN: 10.3 g/dL — AB (ref 12.0–15.0)
MCH: 30.1 pg (ref 26.0–34.0)
MCHC: 32.6 g/dL (ref 30.0–36.0)
MCV: 92.4 fL (ref 78.0–100.0)
Platelets: 195 10*3/uL (ref 150–400)
RBC: 3.42 MIL/uL — ABNORMAL LOW (ref 3.87–5.11)
RDW: 13.5 % (ref 11.5–15.5)
WBC: 13.3 10*3/uL — ABNORMAL HIGH (ref 4.0–10.5)

## 2013-11-24 MED ORDER — METOCLOPRAMIDE HCL 5 MG PO TABS: 5.0000 mg | ORAL_TABLET | Freq: Three times a day (TID) | ORAL | Status: DC | PRN

## 2013-11-24 MED ORDER — OXYCODONE HCL 5 MG PO TABS: 5.0000 mg | ORAL_TABLET | ORAL | Status: DC | PRN

## 2013-11-24 MED ORDER — POLYETHYLENE GLYCOL 3350 17 G PO PACK: 17.0000 g | PACK | Freq: Every day | ORAL | Status: DC | PRN

## 2013-11-24 MED ORDER — ACETAMINOPHEN 325 MG PO TABS: 650.0000 mg | ORAL_TABLET | Freq: Four times a day (QID) | ORAL | Status: DC | PRN

## 2013-11-24 MED ORDER — DSS 100 MG PO CAPS: 100.0000 mg | ORAL_CAPSULE | Freq: Two times a day (BID) | ORAL | Status: DC

## 2013-11-24 MED ORDER — TRAMADOL HCL 50 MG PO TABS: 50.0000 mg | ORAL_TABLET | Freq: Four times a day (QID) | ORAL | Status: DC | PRN

## 2013-11-24 MED ORDER — BISACODYL 10 MG RE SUPP: 10.0000 mg | Freq: Every day | RECTAL | Status: DC | PRN

## 2013-11-24 MED ORDER — RIVAROXABAN 10 MG PO TABS: 10.0000 mg | ORAL_TABLET | Freq: Every day | ORAL | Status: DC

## 2013-11-24 MED ORDER — METHOCARBAMOL 500 MG PO TABS: 500.0000 mg | ORAL_TABLET | Freq: Four times a day (QID) | ORAL | Status: DC | PRN

## 2013-11-24 MED ORDER — ONDANSETRON HCL 4 MG PO TABS: 4.0000 mg | ORAL_TABLET | Freq: Four times a day (QID) | ORAL | Status: DC | PRN

## 2013-11-24 NOTE — Progress Notes (Signed)
   Subjective: 3 Days Post-Op Procedure(s) (LRB): RIGHT TOTAL KNEE ARTHROPLASTY (Right) Patient reports pain as mild.   Patient seen in rounds by Dr. Lequita HaltAluisio. Patient is well, and has had no acute complaints or problems [facility]  Objective: Vital signs in last 24 hours: Temp:  [98 F (36.7 C)-98.3 F (36.8 C)] 98 F (36.7 C) (01/14 2121) Pulse Rate:  [65-77] 65 (01/14 2121) Resp:  [16] 16 (01/14 2121) BP: (83-160)/(54-65) 109/65 mmHg (01/14 2121) SpO2:  [86 %-95 %] 95 % (01/14 2121)  Intake/Output from previous day:  Intake/Output Summary (Last 24 hours) at 11/24/13 [id number] Last data filed at 11/24/13 0834  Gross per 24 hour  Intake    360 ml  Output      0 ml  Net    360 ml    Intake/Output this shift: Total I/O In: 240 [P.O.:240] Out: -   Labs:  Recent Labs  11/22/13 0547 11/23/13 0506 11/24/13 0530  HGB 12.8 11.2* 10.3*    Recent Labs  11/23/13 0506 11/24/13 0530  WBC 18.7* 13.3*  RBC 3.72* 3.42*  HCT 34.6* 31.6*  PLT 207 195    Recent Labs  11/22/13 0547 11/23/13 0506  NA 141 138  K 4.3 4.2  CL 102 102  CO2 29 28  BUN 9 12  CREATININE 0.52 0.56  GLUCOSE 129* 126*  CALCIUM 9.1 9.1   No results found for this basename: LABPT, INR,  in the last 72 hours  EXAM: General - Patient is Alert, Appropriate and Oriented Extremity - Neurovascular intact Sensation intact distally Incision - clean, dry, no drainage, healing Motor Function - intact, moving foot and toes well on exam.   Assessment/Plan: 3 Days Post-Op Procedure(s) (LRB): RIGHT TOTAL KNEE ARTHROPLASTY (Right) Procedure(s) (LRB): RIGHT TOTAL KNEE ARTHROPLASTY (Right) Past Medical History  Diagnosis Date  . Arthritis     oa  . Hyperlipidemia    Principal Problem:   OA (osteoarthritis) of knee  Estimated body mass index is 25.17 kg/(m^2) as calculated from the following:   Height as of this encounter: 5' 7.5" (1.715 m).   Weight as of this  encounter: 74.027 kg (163 lb 3.2 oz). Up with therapy Discharge home with home health Diet - Cardiac diet Follow up - in 2 weeks Activity - WBAT Disposition - Skilled nursing facility - [facility] Condition Upon Discharge - Good D/C Meds - See DC Summary DVT Prophylaxis - Xarelto  PERKINS, ALEXZANDREW 11/24/2013, 9:22 AM

## 2013-11-24 NOTE — Progress Notes (Signed)
Physical Therapy Treatment Patient Details Name: Gabrielle LeanBarbara A [name] MRN: [id number] DOB: 08-Jul-1927 Today's Date: 11/24/2013 Time: [id number] PT Time Calculation (min): 27 min  PT Assessment / Plan / Recommendation  History of Present Illness s/p RTKA   PT Comments   Pt will benefit from PT  At SNF level  Follow Up Recommendations  SNF     Does the patient have the potential to tolerate intense rehabilitation     Barriers to Discharge        Equipment Recommendations  None recommended by PT    Recommendations for Other Services    Frequency 7X/week   Progress towards PT Goals Progress towards PT goals: Progressing toward goals  Plan Current plan remains appropriate    Precautions / Restrictions Precautions Precautions: Knee Restrictions Other Position/Activity Restrictions: WBAT RLE   Pertinent Vitals/Pain Denies pain   Mobility  Transfers Overall transfer level: Needs assistance Equipment used: Rolling walker (2 wheeled) Transfers: Sit to/from Stand Sit to Stand: Supervision General transfer comment: cues for hand placement and RLE position Ambulation/Gait Ambulation/Gait assistance: Supervision Ambulation Distance (Feet): 160 Feet Assistive device: Rolling walker (2 wheeled) Gait Pattern/deviations: Step-to pattern;Step-through pattern General Gait Details: cues for sequence and walker safety with turns    Exercises Total Joint Exercises Quad Sets: AROM;Both;10 reps Hip ABduction/ADduction: AROM;AAROM;Right;10 reps Straight Leg Raises: AROM;Right;10 reps;Supine Knee Flexion: AROM;Right;10 reps;Seated Goniometric ROM: 5-103   PT Diagnosis:    PT Problem List:   PT Treatment Interventions:     PT Goals (current goals can now be found in the care plan section) Acute Rehab PT Goals Time For Goal Achievement: 11/29/13 Potential to Achieve Goals: Good  Visit Information  Last PT Received On: 11/24/13 Assistance Needed: +1 History of Present Illness:  s/p RTKA    Subjective Data      Cognition  Cognition Arousal/Alertness: Awake/alert Behavior During Therapy: WFL for tasks assessed/performed Overall Cognitive Status: Within Functional Limits for tasks assessed    Balance     End of Session PT - End of Session Equipment Utilized During Treatment: Gait belt Activity Tolerance: Patient tolerated treatment well Patient left: with call bell/phone within reach;in chair Nurse Communication: Mobility status   GP     [facility]ILLIAMS,Biran Mayberry 11/24/2013, 12:43 PM

## 2013-11-24 NOTE — Discharge Summary (Signed)
Physician Discharge Summary   Patient ID: CAHTERINE [name] MRN: [id number] DOB/AGE: 13-Feb-1927 80 y.o.  Admit date: 11/21/2013 Discharge date: 11-24-2013  Primary Diagnosis:  Osteoarthritis Right knee(s)  Admission Diagnoses:  Past Medical History  Diagnosis Date  . Arthritis     oa  . Hyperlipidemia    Discharge Diagnoses:   Principal Problem:   OA (osteoarthritis) of knee  Estimated body mass index is 25.17 kg/(m^2) as calculated from the following:   Height as of this encounter: 5' 7.5" (1.715 m).   Weight as of this encounter: 74.027 kg (163 lb 3.2 oz).  Procedure:  Procedure(s) (LRB): RIGHT TOTAL KNEE ARTHROPLASTY (Right)   Consults: None  HPI: Gabrielle [name] is a 80 y.o. year old female with end stage OA of her right knee with progressively worsening pain and dysfunction. She has constant pain, with activity and at rest and significant functional deficits with difficulties even with ADLs. She has had extensive non-op management including analgesics, injections of cortisone and viscosupplements, and home exercise program, but remains in significant pain with significant dysfunction.Radiographs show bone on bone arthritis medial and patellofemoral. She presents now for right Total Knee Arthroplasty.   Laboratory Data: Admission on 11/21/2013  Component Date Value Range Status  . WBC 11/22/2013 20.1* 4.0 - 10.5 K/uL Final  . RBC 11/22/2013 4.27  3.87 - 5.11 MIL/uL Final  . Hemoglobin 11/22/2013 12.8  12.0 - 15.0 g/dL Final  . HCT 11/22/2013 39.5  36.0 - 46.0 % Final  . MCV 11/22/2013 92.5  78.0 - 100.0 fL Final  . MCH 11/22/2013 30.0  26.0 - 34.0 pg Final  . MCHC 11/22/2013 32.4  30.0 - 36.0 g/dL Final  . RDW 11/22/2013 12.9  11.5 - 15.5 % Final  . Platelets 11/22/2013 227  150 - 400 K/uL Final  . Sodium 11/22/2013 141  137 - 147 mEq/L Final  . Potassium 11/22/2013 4.3  3.7 - 5.3 mEq/L Final  . Chloride 11/22/2013 102  96 - 112 mEq/L Final  . CO2  11/22/2013 29  19 - 32 mEq/L Final  . Glucose, Bld 11/22/2013 129* 70 - 99 mg/dL Final  . BUN 11/22/2013 9  6 - 23 mg/dL Final  . Creatinine, Ser 11/22/2013 0.52  0.50 - 1.10 mg/dL Final  . Calcium 11/22/2013 9.1  8.4 - 10.5 mg/dL Final  . GFR calc non Af Amer 11/22/2013 85* >90 mL/min Final  . GFR calc Af Amer 11/22/2013 >90  >90 mL/min Final   Comment: (NOTE)                          The eGFR has been calculated using the CKD EPI equation.                          This calculation has not been validated in all clinical situations.                          eGFR's persistently <90 mL/min signify possible Chronic Kidney                          Disease.  . WBC 11/23/2013 18.7* 4.0 - 10.5 K/uL Final  . RBC 11/23/2013 3.72* 3.87 - 5.11 MIL/uL Final  . Hemoglobin 11/23/2013 11.2* 12.0 - 15.0 g/dL Final  . HCT 11/23/2013 34.6* 36.0 - 46.0 % Final  .  MCV 11/23/2013 93.0  78.0 - 100.0 fL Final  . MCH 11/23/2013 30.1  26.0 - 34.0 pg Final  . MCHC 11/23/2013 32.4  30.0 - 36.0 g/dL Final  . RDW 38/17/7116 13.2  11.5 - 15.5 % Final  . Platelets 11/23/2013 207  150 - 400 K/uL Final  . Sodium 11/23/2013 138  137 - 147 mEq/L Final  . Potassium 11/23/2013 4.2  3.7 - 5.3 mEq/L Final  . Chloride 11/23/2013 102  96 - 112 mEq/L Final  . CO2 11/23/2013 28  19 - 32 mEq/L Final  . Glucose, Bld 11/23/2013 126* 70 - 99 mg/dL Final  . BUN 57/90/3833 12  6 - 23 mg/dL Final  . Creatinine, Ser 11/23/2013 0.56  0.50 - 1.10 mg/dL Final  . Calcium 38/32/9191 9.1  8.4 - 10.5 mg/dL Final  . GFR calc non Af Amer 11/23/2013 83* >90 mL/min Final  . GFR calc Af Amer 11/23/2013 >90  >90 mL/min Final   Comment: (NOTE)                          The eGFR has been calculated using the CKD EPI equation.                          This calculation has not been validated in all clinical situations.                          eGFR's persistently <90 mL/min signify possible Chronic Kidney                          Disease.  . WBC  11/24/2013 13.3* 4.0 - 10.5 K/uL Final  . RBC 11/24/2013 3.42* 3.87 - 5.11 MIL/uL Final  . Hemoglobin 11/24/2013 10.3* 12.0 - 15.0 g/dL Final  . HCT 66/04/44 31.6* 36.0 - 46.0 % Final  . MCV 11/24/2013 92.4  78.0 - 100.0 fL Final  . MCH 11/24/2013 30.1  26.0 - 34.0 pg Final  . MCHC 11/24/2013 32.6  30.0 - 36.0 g/dL Final  . RDW 99/77/4142 13.5  11.5 - 15.5 % Final  . Platelets 11/24/2013 195  150 - 400 K/uL Final  Hospital Outpatient Visit on 11/15/2013  Component Date Value Range Status  . aPTT 11/15/2013 27  24 - 37 seconds Final  . WBC 11/15/2013 7.5  4.0 - 10.5 K/uL Final  . RBC 11/15/2013 4.67  3.87 - 5.11 MIL/uL Final  . Hemoglobin 11/15/2013 14.1  12.0 - 15.0 g/dL Final  . HCT 39/53/2023 43.3  36.0 - 46.0 % Final  . MCV 11/15/2013 92.7  78.0 - 100.0 fL Final  . MCH 11/15/2013 30.2  26.0 - 34.0 pg Final  . MCHC 11/15/2013 32.6  30.0 - 36.0 g/dL Final  . RDW 34/35/6861 13.1  11.5 - 15.5 % Final  . Platelets 11/15/2013 259  150 - 400 K/uL Final  . Sodium 11/15/2013 140  137 - 147 mEq/L Final  . Potassium 11/15/2013 5.3  3.7 - 5.3 mEq/L Final  . Chloride 11/15/2013 101  96 - 112 mEq/L Final  . CO2 11/15/2013 30  19 - 32 mEq/L Final  . Glucose, Bld 11/15/2013 89  70 - 99 mg/dL Final  . BUN 68/37/2902 14  6 - 23 mg/dL Final  . Creatinine, Ser 11/15/2013 0.68  0.50 - 1.10 mg/dL Final  . Calcium 09/25/5207  10.0  8.4 - 10.5 mg/dL Final  . Total Protein 11/15/2013 7.6  6.0 - 8.3 g/dL Final  . Albumin 11/15/2013 3.7  3.5 - 5.2 g/dL Final  . AST 11/15/2013 19  0 - 37 U/L Final  . ALT 11/15/2013 14  0 - 35 U/L Final  . Alkaline Phosphatase 11/15/2013 81  39 - 117 U/L Final  . Total Bilirubin 11/15/2013 0.4  0.3 - 1.2 mg/dL Final  . GFR calc non Af Amer 11/15/2013 78* >90 mL/min Final  . GFR calc Af Amer 11/15/2013 >90  >90 mL/min Final   Comment: (NOTE)                          The eGFR has been calculated using the CKD EPI equation.                          This calculation has  not been validated in all clinical situations.                          eGFR's persistently <90 mL/min signify possible Chronic Kidney                          Disease.  Marland Kitchen Prothrombin Time 11/15/2013 13.4  11.6 - 15.2 seconds Final  . INR 11/15/2013 1.04  0.00 - 1.49 Final  . ABO/RH(D) 11/15/2013 A POS   Final  . Antibody Screen 11/15/2013 NEG   Final  . Sample Expiration 11/15/2013 11/24/2013   Final  . Color, Urine 11/15/2013 YELLOW  YELLOW Final  . APPearance 11/15/2013 CLEAR  CLEAR Final  . Specific Gravity, Urine 11/15/2013 1.016  1.005 - 1.030 Final  . pH 11/15/2013 7.0  5.0 - 8.0 Final  . Glucose, UA 11/15/2013 NEGATIVE  NEGATIVE mg/dL Final  . Hgb urine dipstick 11/15/2013 MODERATE* NEGATIVE Final  . Bilirubin Urine 11/15/2013 NEGATIVE  NEGATIVE Final  . Ketones, ur 11/15/2013 NEGATIVE  NEGATIVE mg/dL Final  . Protein, ur 11/15/2013 NEGATIVE  NEGATIVE mg/dL Final  . Urobilinogen, UA 11/15/2013 0.2  0.0 - 1.0 mg/dL Final  . Nitrite 11/15/2013 NEGATIVE  NEGATIVE Final  . Leukocytes, UA 11/15/2013 TRACE* NEGATIVE Final  . MRSA, PCR 11/15/2013 NEGATIVE  NEGATIVE Final  . Staphylococcus aureus 11/15/2013 NEGATIVE  NEGATIVE Final   Comment:                                 The Xpert SA Assay (FDA                          approved for NASAL specimens                          in patients over 50 years of age),                          is one component of                          a comprehensive surveillance                          program.  Test performance has                          been validated by [facility] for patients greater                          than or equal to 95 year old.                          It is not intended                          to diagnose infection nor to                          guide or monitor treatment.  . ABO/RH(D) 11/15/2013 A POS   Final  . Squamous Epithelial / LPF 11/15/2013 RARE  RARE Final  . WBC, UA 11/15/2013  0-2  <3 WBC/hpf Final  . RBC / HPF 11/15/2013 7-10  <3 RBC/hpf Final  . Bacteria, UA 11/15/2013 RARE  RARE Final     X-Rays:No results found.  EKG:No orders found for this or any previous visit.   Hospital Course: Gabrielle [name] is a 80 y.o. who was admitted to [facility]. They were brought to the operating room on 11/21/2013 and underwent Procedure(s): RIGHT TOTAL KNEE ARTHROPLASTY.  Patient tolerated the procedure well and was later transferred to the recovery room and then to the orthopaedic floor for postoperative care.  They were given PO and IV analgesics for pain control following their surgery.  They were given 24 hours of postoperative antibiotics of  Anti-infectives   Start     Dose/Rate Route Frequency Ordered Stop   11/21/13 1900  ceFAZolin (ANCEF) IVPB 2 g/50 mL premix     2 g 100 mL/hr over 30 Minutes Intravenous Every 6 hours 11/21/13 1639 11/22/13 0102   11/21/13 1030  ceFAZolin (ANCEF) IVPB 2 g/50 mL premix    Comments:  Dose changed to 2g per P&T policy for weight < 277OE.   2 g 100 mL/hr over 30 Minutes Intravenous On call to O.R. 11/21/13 1024 11/21/13 1250     and started on DVT prophylaxis in the form of Xarelto.   PT and OT were ordered for total joint protocol.  Discharge planning consulted to help with postop disposition and equipment needs.  Patient had a good night on the evening of surgery.  They started to get up OOB with therapy on day one. Hemovac drain was pulled without difficulty.  Continued to work with therapy into day two.  Dressing was changed on day two and the incision was healing well.  By day three, the patient had progressed with therapy and meeting their goals.  Incision was healing well.  Patient was seen in rounds and was ready to go to [facility].   Discharge Medications: Prior to Admission medications   Medication Sig Start Date End Date Taking? Authorizing Provider  timolol (BETIMOL) 0.25 % ophthalmic solution Place 1 drop  into both eyes 2 (two) times daily.   Yes Historical Provider, MD  acetaminophen (TYLENOL) 325 MG tablet Take 2 tablets (650 mg total) by mouth every  6 (six) hours as needed for mild pain (or Fever >/= 101). 11/24/13   Alexzandrew Dara Lords, PA-C  alendronate (FOSAMAX) 70 MG tablet Take 70 mg by mouth daily with supper. Take with a full glass of water on an empty stomach.    Historical Provider, MD  aspirin EC 81 MG tablet Take 81 mg by mouth 3 (three) times a week.    Historical Provider, MD  atorvastatin (LIPITOR) 20 MG tablet Take 20 mg by mouth daily.    Historical Provider, MD  bisacodyl (DULCOLAX) 10 MG suppository Place 1 suppository (10 mg total) rectally daily as needed for moderate constipation. 11/24/13   Alexzandrew Dara Lords, PA-C  Cholecalciferol (VITAMIN D3) 2000 UNITS TABS Take 2,000 Units by mouth daily.    Historical Provider, MD  citalopram (CELEXA) 20 MG tablet Take 20 mg by mouth daily with supper.    Historical Provider, MD  docusate sodium 100 MG CAPS Take 100 mg by mouth 2 (two) times daily. 11/24/13   Alexzandrew Dara Lords, PA-C  methocarbamol (ROBAXIN) 500 MG tablet Take 1 tablet (500 mg total) by mouth every 6 (six) hours as needed for muscle spasms. 11/24/13   Alexzandrew Dara Lords, PA-C  metoCLOPramide (REGLAN) 5 MG tablet Take 1-2 tablets (5-10 mg total) by mouth every 8 (eight) hours as needed for nausea (if ondansetron (ZOFRAN) ineffective.). 11/24/13   Alexzandrew Perkins, PA-C  Omega-3 Fatty Acids (FISH OIL PO) Take 1 tablet by mouth daily.    Historical Provider, MD  ondansetron (ZOFRAN) 4 MG tablet Take 1 tablet (4 mg total) by mouth every 6 (six) hours as needed for nausea. 11/24/13   Alexzandrew Perkins, PA-C  oxyCODONE (OXY IR/ROXICODONE) 5 MG immediate release tablet Take 1-2 tablets (5-10 mg total) by mouth every 3 (three) hours as needed for moderate pain. 11/24/13   Alexzandrew Perkins, PA-C  polyethylene glycol (MIRALAX / GLYCOLAX) packet Take 17 g by mouth daily as needed  for mild constipation. 11/24/13   Alexzandrew Perkins, PA-C  psyllium (METAMUCIL) 58.6 % packet Take 1 packet by mouth daily.    Historical Provider, MD  rivaroxaban (XARELTO) 10 MG TABS tablet Take 1 tablet (10 mg total) by mouth daily with breakfast. Take Xarelto for two and a half more weeks, then discontinue Xarelto. Once the patient has completed the Xarelto, they may resume the 81 mg Aspirin. 11/24/13   Alexzandrew Perkins, PA-C  traMADol (ULTRAM) 50 MG tablet Take 1 tablet (50 mg total) by mouth every 6 (six) hours as needed (mild to moderate pain). 11/24/13   Alexzandrew Dara Lords, PA-C   Discharge home with home health  Diet - Cardiac diet  Follow up - in 2 weeks  Activity - WBAT  Disposition - [place] Upon Discharge - Good  D/C Meds - See DC Summary  DVT Prophylaxis - Xarelto      Discharge Orders   Future Orders Complete By Expires   Call MD / Call 911  As directed    Comments:     If you experience chest pain or shortness of breath, CALL 911 and be transported to the hospital emergency room.  If you develope a fever above 101 F, pus (white drainage) or increased drainage or redness at the wound, or calf pain, call your surgeon's office.   Change dressing  As directed    Comments:     Change dressing daily with sterile 4 x 4 inch gauze dressing and apply TED hose. Do not submerge the incision  under water.   Constipation Prevention  As directed    Comments:     Drink plenty of fluids.  Prune juice may be helpful.  You may use a stool softener, such as Colace (over the counter) 100 mg twice a day.  Use MiraLax (over the counter) for constipation as needed.   Diet - low sodium heart healthy  As directed    Discharge instructions  As directed    Comments:     Pick up stool softner and laxative for home. Do not submerge incision under water. May shower. Continue to use ice for pain and swelling from surgery.  Take Xarelto for two and a  half more weeks, then discontinue Xarelto. Once the patient has completed the Xarelto, they may resume the 81 mg Aspirin.  When discharged from the skilled rehab facility, please have the facility set up the patient's [place] prior to being released.  Also provide the patient with their medications at time of release from the facility to include their pain medication, the muscle relaxants, and their blood thinner medication.  If the patient is still at the rehab facility at time of follow up appointment, please also assist the patient in arranging follow up appointment in our office and any transportation needs.   Do not put a pillow under the knee. Place it under the heel.  As directed    Do not sit on low chairs, stoools or toilet seats, as it may be difficult to get up from low surfaces  As directed    Driving restrictions  As directed    Comments:     No driving until released by the physician.   Increase activity slowly as tolerated  As directed    Lifting restrictions  As directed    Comments:     No lifting until released by the physician.   Patient may shower  As directed    Comments:     You may shower without a dressing once there is no drainage.  Do not wash over the wound.  If drainage remains, do not shower until drainage stops.   TED hose  As directed    Comments:     Use stockings (TED hose) for 3 weeks on both leg(s).  You may remove them at night for sleeping.   Weight bearing as tolerated  As directed    Questions:     Laterality:     Extremity:         Medication List    STOP taking these medications       alendronate 70 MG tablet  Commonly known as:  FOSAMAX     aspirin EC 81 MG tablet     FISH OIL PO     Vitamin D3 2000 UNITS Tabs      TAKE these medications       acetaminophen 325 MG tablet  Commonly known as:  TYLENOL  Take 2 tablets (650 mg total) by mouth every 6 (six) hours as needed for mild pain (or Fever >/= 101).      atorvastatin 20 MG tablet  Commonly known as:  LIPITOR  Take 20 mg by mouth daily.     bisacodyl 10 MG suppository  Commonly known as:  DULCOLAX  Place 1 suppository (10 mg total) rectally daily as needed for moderate constipation.     citalopram 20 MG tablet  Commonly known as:  CELEXA  Take 20 mg by mouth daily with supper.  DSS 100 MG Caps  Take 100 mg by mouth 2 (two) times daily.     methocarbamol 500 MG tablet  Commonly known as:  ROBAXIN  Take 1 tablet (500 mg total) by mouth every 6 (six) hours as needed for muscle spasms.     metoCLOPramide 5 MG tablet  Commonly known as:  REGLAN  Take 1-2 tablets (5-10 mg total) by mouth every 8 (eight) hours as needed for nausea (if ondansetron (ZOFRAN) ineffective.).     ondansetron 4 MG tablet  Commonly known as:  ZOFRAN  Take 1 tablet (4 mg total) by mouth every 6 (six) hours as needed for nausea.     oxyCODONE 5 MG immediate release tablet  Commonly known as:  Oxy IR/ROXICODONE  Take 1-2 tablets (5-10 mg total) by mouth every 3 (three) hours as needed for moderate pain.     polyethylene glycol packet  Commonly known as:  MIRALAX / GLYCOLAX  Take 17 g by mouth daily as needed for mild constipation.     psyllium 58.6 % packet  Commonly known as:  METAMUCIL  Take 1 packet by mouth daily.     rivaroxaban 10 MG Tabs tablet  Commonly known as:  XARELTO  - Take 1 tablet (10 mg total) by mouth daily with breakfast. Take Xarelto for two and a half more weeks, then discontinue Xarelto.  - Once the patient has completed the Xarelto, they may resume the 81 mg Aspirin.     timolol 0.25 % ophthalmic solution  Commonly known as:  BETIMOL  Place 1 drop into both eyes 2 (two) times daily.     traMADol 50 MG tablet  Commonly known as:  ULTRAM  Take 1 tablet (50 mg total) by mouth every 6 (six) hours as needed (mild to moderate pain).       Follow-up Information   Follow up with Gearlean Alf, MD. Schedule an appointment as  soon as possible for a visit in 2 weeks.   Specialty:  Orthopedic Surgery   Contact information:   [address] [place] [address] [phone]       Signed: Mickel Crow 11/24/2013, 9:31 AM

## 2013-11-25 NOTE — Progress Notes (Signed)
Clinical Social Work Department CLINICAL SOCIAL WORK PLACEMENT NOTE 11/25/2013  Patient:  Gabrielle [name],Gabrielle [name]  Account Number:  [id number] Admit date:  11/21/2013  Clinical Social Worker:  Cori RazorJAMIE Franciso Dierks, LCSW  Date/time:  11/22/2013 04:25 PM  Clinical Social Work is seeking post-discharge placement for this patient at the following level of care:   SKILLED NURSING   (*CSW will update this form in Epic as items are completed)   11/22/2013  Patient/family provided with [facility] of Clinical Social Work's list of facilities offering this level of care within the geographic area requested by the patient (or if unable, by the patient's family).  11/22/2013  Patient/family informed of their freedom to choose among providers that offer the needed level of care, that participate in Medicare, Medicaid or managed care program needed by the patient, have an available bed and are willing to accept the patient.    Patient/family informed of [facility]' ownership interest in [facility], as well as of the fact that they are under no obligation to receive care at this facility.  PASARR submitted to EDS on 11/22/2013 PASARR number received from EDS on 11/22/2013  FL2 transmitted to all facilities in geographic area requested by pt/family on  11/22/2013 FL2 transmitted to all facilities within larger geographic area on   Patient informed that his/her managed care company has contracts with or will negotiate with  certain facilities, including the following:     Patient/family informed of bed offers received:  11/22/2013 [facility] Physician recommends and patient chooses bed at    [facility]  11/24/2013 Patient to be transferred to facility by [facility]  The following physician request were entered in Epic:   Additional Comments:   Cori RazorJamie Lovett Coffin LCSW [phone]

## 2013-11-30 ENCOUNTER — Non-Acute Institutional Stay (SKILLED_NURSING_FACILITY): Payer: Medicare Other | Admitting: Internal Medicine

## 2013-11-30 DIAGNOSIS — E7800 Pure hypercholesterolemia, unspecified: Secondary | ICD-10-CM | POA: Diagnosis not present

## 2013-11-30 DIAGNOSIS — M1710 Unilateral primary osteoarthritis, unspecified knee: Secondary | ICD-10-CM

## 2013-11-30 DIAGNOSIS — IMO0002 Reserved for concepts with insufficient information to code with codable children: Secondary | ICD-10-CM

## 2013-11-30 DIAGNOSIS — D62 Acute posthemorrhagic anemia: Secondary | ICD-10-CM | POA: Diagnosis not present

## 2013-11-30 DIAGNOSIS — K5900 Constipation, unspecified: Secondary | ICD-10-CM

## 2013-11-30 DIAGNOSIS — M179 Osteoarthritis of knee, unspecified: Secondary | ICD-10-CM

## 2013-11-30 NOTE — Progress Notes (Signed)
HISTORY & PHYSICAL  DATE: 11/30/2013   FACILITY: [facility]  LEVEL OF CARE: SNF (31)  ALLERGIES:  No Known Allergies  CHIEF COMPLAINT:  Manage right knee osteoarthritis, constipation and hyperlipidemia  HISTORY OF PRESENT ILLNESS: Patient is an 80 year old Caucasian female.  KNEE OSTEOARTHRITIS: Patient had a history of pain and functional disability in the knee due to end-stage osteoarthritis and has failed nonsurgical conservative treatments. Patient had worsening of pain with activity and weight bearing, pain that interfered with activities of daily living & pain with passive range of motion. Therefore patient underwent total knee arthroplasty and tolerated the procedure well. Patient is admitted to this facility for sort short-term rehabilitation. Patient denies knee pain.  CONSTIPATION: The constipation remains stable. No complications from the medications presently being used. Patient denies ongoing constipation, abdominal pain, nausea or vomiting.  HYPERLIPIDEMIA: No complications from the medications presently being used. Last fasting lipid panel not available.  PAST MEDICAL HISTORY :  Past Medical History  Diagnosis Date  . Arthritis     oa  . Hyperlipidemia     PAST SURGICAL HISTORY: Past Surgical History  Procedure Laterality Date  . Tonsillectomy  age 7  . Wrist fracture surgery Right   . Benign breast tumor removed  age 61  . Abdominal hysterectomy    . Total knee arthroplasty Right 11/21/2013    Procedure: RIGHT TOTAL KNEE ARTHROPLASTY;  Surgeon: Loanne Drilling, MD;  Location: [facility];  Service: Orthopedics;  Laterality: Right;    SOCIAL HISTORY:  reports that she quit smoking about 48 years ago. Her smoking use included Cigarettes. She has a 17 pack-year smoking history. She has never used smokeless tobacco. She reports that she drinks alcohol. She reports that she does not use illicit drugs.  FAMILY HISTORY: None  CURRENT  MEDICATIONS: Reviewed per MAR  REVIEW OF SYSTEMS:  See HPI otherwise 14 point ROS is negative.  PHYSICAL EXAMINATION  VS:      WT (Lb) 163  GENERAL: no acute distress, normal body habitus EYES: conjunctivae normal, sclerae normal, normal eye lids MOUTH/THROAT: lips without lesions,no lesions in the mouth,tongue is without lesions,uvula elevates in midline NECK: supple, trachea midline, no neck masses, no thyroid tenderness, no thyromegaly LYMPHATICS: no LAN in the neck, no supraclavicular LAN RESPIRATORY: breathing is even & unlabored, BS CTAB CARDIAC: RRR, no murmur,no extra heart sounds, +3 bilateral lower extremity edema, right greater than left GI:  ABDOMEN: abdomen soft, normal BS, no masses, no tenderness  LIVER/SPLEEN: no hepatomegaly, no splenomegaly MUSCULOSKELETAL: HEAD: normal to inspection & palpation BACK: no kyphosis, scoliosis or spinal processes tenderness EXTREMITIES: LEFT UPPER EXTREMITY: full range of motion, normal strength & tone RIGHT UPPER EXTREMITY:  full range of motion, normal strength & tone LEFT LOWER EXTREMITY:  full range of motion, normal strength & tone RIGHT LOWER EXTREMITY:  range of motion not tested due to surgery, normal strength & tone PSYCHIATRIC: the patient is alert & oriented to person, affect & behavior appropriate  LABS/RADIOLOGY:  Labs reviewed: Basic Metabolic Panel:  Recent Labs  16/10/96 1100 11/22/13 0547 11/23/13 0506  NA 140 141 138  K 5.3 4.3 4.2  CL 101 102 102  CO2 30 29 28   GLUCOSE 89 129* 126*  BUN 14 9 12   CREATININE 0.68 0.52 0.56  CALCIUM 10.0 9.1 9.1   Liver Function Tests:  Recent Labs  11/15/13 1100  AST 19  ALT 14  ALKPHOS 81  BILITOT 0.4  PROT 7.6  ALBUMIN 3.7   CBC:  Recent Labs  11/22/13 0547 11/23/13 0506 11/24/13 0530  WBC 20.1* 18.7* 13.3*  HGB 12.8 11.2* 10.3*  HCT 39.5 34.6* 31.6*  MCV 92.5 93.0 92.4  PLT 227 207 195    ASSESSMENT/PLAN:  Right knee osteoarthritis-status  post right total knee arthroplasty. Continue rehabilitation. Constipation-well controlled Hyperlipidemia-continue current medications. Acute blood loss anemia-recheck Leukocytosis-recheck Hyperglycemia-recheck Check CBC with differential and BMP  I have reviewed patient's medical records received at admission/from hospitalization.  CPT CODE: [id number]

## 2013-12-09 ENCOUNTER — Non-Acute Institutional Stay (SKILLED_NURSING_FACILITY): Payer: Medicare Other | Admitting: Adult Health

## 2013-12-09 DIAGNOSIS — F32A Depression, unspecified: Secondary | ICD-10-CM

## 2013-12-09 DIAGNOSIS — M1710 Unilateral primary osteoarthritis, unspecified knee: Secondary | ICD-10-CM | POA: Diagnosis not present

## 2013-12-09 DIAGNOSIS — E7800 Pure hypercholesterolemia, unspecified: Secondary | ICD-10-CM | POA: Diagnosis not present

## 2013-12-09 DIAGNOSIS — IMO0002 Reserved for concepts with insufficient information to code with codable children: Secondary | ICD-10-CM | POA: Diagnosis not present

## 2013-12-09 DIAGNOSIS — F329 Major depressive disorder, single episode, unspecified: Secondary | ICD-10-CM

## 2013-12-09 DIAGNOSIS — M179 Osteoarthritis of knee, unspecified: Secondary | ICD-10-CM

## 2013-12-09 DIAGNOSIS — K5900 Constipation, unspecified: Secondary | ICD-10-CM | POA: Diagnosis not present

## 2013-12-09 DIAGNOSIS — F3289 Other specified depressive episodes: Secondary | ICD-10-CM

## 2013-12-09 NOTE — Progress Notes (Signed)
Patient ID: Gabrielle [name], female   DOB: May 20, 1927, 80 y.o.   MRN: [id number]              PROGRESS NOTE  DATE: 12/09/2013   FACILITY: [facility]  LEVEL OF CARE: SNF (31)  Acute Visit  CHIEF COMPLAINT:  Discharge Notes  HISTORY OF PRESENT ILLNESS: This is a 80 year old female who is for discharge home with Home health PT and OT. She has been admitted to [facility] on 11/24/13 [facility] with Osteoarthritis S/P right total knee arthroplasty. Patient was admitted to this facility for short-term rehabilitation after the patient's recent hospitalization.  Patient has completed SNF rehabilitation and therapy has cleared the patient for discharge.  Reassessment of ongoing problem(s):  DEPRESSION: The depression remains stable. Patient denies ongoing feelings of sadness, insomnia, anedhonia or lack of appetite. No complications reported from the medications currently being used. Staff do not report behavioral problems.  CONSTIPATION: The constipation remains stable. No complications from the medications presently being used. Patient denies ongoing constipation, abdominal pain, nausea or vomiting.  HYPERLIPIDEMIA: No complications from the medications presently being used.    PAST MEDICAL HISTORY : Reviewed.  No changes.  [facility]  REVIEW OF SYSTEMS:  GENERAL: no change in appetite, no fatigue, no weight changes, no fever, chills or weakness RESPIRATORY: no cough, SOB, DOE, wheezing, hemoptysis CARDIAC: no chest pain,or palpitations, +edema GI: no abdominal pain, diarrhea, constipation, heart burn, nausea or vomiting  PHYSICAL EXAMINATION  VS:  T 96.9      P70       RR20      BP127/56      POX 99%       WT163.6 (Lb)  GENERAL: no acute distress, normal body habitus EYES: conjunctivae normal, sclerae normal, normal eye lids NECK: supple, trachea midline, no neck masses, no thyroid tenderness, no  thyromegaly LYMPHATICS: no LAN in the neck, no supraclavicular LAN RESPIRATORY: breathing is even & unlabored, BS CTAB CARDIAC: RRR, no murmur,no extra heart sounds, BLE edema, 2+ GI: abdomen soft, normal BS, no masses, no tenderness, no hepatomegaly, no splenomegaly PSYCHIATRIC: the patient is alert & oriented to person, affect & behavior appropriate  LABS/RADIOLOGY: Labs reviewed:  12/05/13 WBC 8.5 hemoglobin 10.4 hematocrit 34.5 12/02/48 and hemoglobin A1c 5.6 12/01/13 WBC 12.1 hemoglobin 11.6 hematocrit 36.4 sodium 137 potassium 4.1 glucose 86  BUN 10 creatinine 0.6 calcium 9.4  Basic Metabolic Panel:  Recent Labs  52/84/1300/04/24 1100 11/22/13 0547 11/23/13 0506  NA 140 141 138  K 5.3 4.3 4.2  CL 101 102 102  CO2 30 29 28   GLUCOSE 89 129* 126*  BUN 14 9 12   CREATININE 0.68 0.52 0.56  CALCIUM 10.0 9.1 9.1   Liver Function Tests:  Recent Labs  11/15/13 1100  AST 19  ALT 14  ALKPHOS 81  BILITOT 0.4  PROT 7.6  ALBUMIN 3.7   CBC:  Recent Labs  11/22/13 0547 11/23/13 0506 11/24/13 0530  WBC 20.1* 18.7* 13.3*  HGB 12.8 11.2* 10.3*  HCT 39.5 34.6* 31.6*  MCV 92.5 93.0 92.4  PLT 227 207 195     ASSESSMENT/PLAN:  Osteoarthritis status post right total knee arthroplasty - for home health PT and OT  Hyperlipidemia - continue Lipitor  Depression - continue Celexa  Constipation - no complaints; continue Colace, MiraLax and Metamucil   I have filled out patient's discharge paperwork and written prescriptions.  Patient will receive home health PT and  OT.   Total discharge time: Less than 30 minutes Discharge time involved coordination of the discharge process with social worker, nursing staff and therapy department. Medical justification for home health services verified.  CPT CODE: 16109

## 2013-12-12 DIAGNOSIS — Z471 Aftercare following joint replacement surgery: Secondary | ICD-10-CM | POA: Diagnosis not present

## 2013-12-12 DIAGNOSIS — Z96659 Presence of unspecified artificial knee joint: Secondary | ICD-10-CM | POA: Diagnosis not present

## 2013-12-12 DIAGNOSIS — Z4801 Encounter for change or removal of surgical wound dressing: Secondary | ICD-10-CM | POA: Diagnosis not present

## 2013-12-13 DIAGNOSIS — Z471 Aftercare following joint replacement surgery: Secondary | ICD-10-CM | POA: Diagnosis not present

## 2013-12-13 DIAGNOSIS — Z96659 Presence of unspecified artificial knee joint: Secondary | ICD-10-CM | POA: Diagnosis not present

## 2013-12-13 DIAGNOSIS — Z4801 Encounter for change or removal of surgical wound dressing: Secondary | ICD-10-CM | POA: Diagnosis not present

## 2013-12-14 DIAGNOSIS — Z4801 Encounter for change or removal of surgical wound dressing: Secondary | ICD-10-CM | POA: Diagnosis not present

## 2013-12-14 DIAGNOSIS — Z96659 Presence of unspecified artificial knee joint: Secondary | ICD-10-CM | POA: Diagnosis not present

## 2013-12-14 DIAGNOSIS — Z471 Aftercare following joint replacement surgery: Secondary | ICD-10-CM | POA: Diagnosis not present

## 2013-12-16 ENCOUNTER — Telehealth: Payer: Self-pay | Admitting: *Deleted

## 2013-12-16 ENCOUNTER — Other Ambulatory Visit: Payer: Self-pay | Admitting: *Deleted

## 2013-12-16 DIAGNOSIS — Z96659 Presence of unspecified artificial knee joint: Secondary | ICD-10-CM | POA: Diagnosis not present

## 2013-12-16 DIAGNOSIS — Z471 Aftercare following joint replacement surgery: Secondary | ICD-10-CM | POA: Diagnosis not present

## 2013-12-16 DIAGNOSIS — Z4801 Encounter for change or removal of surgical wound dressing: Secondary | ICD-10-CM | POA: Diagnosis not present

## 2013-12-16 NOTE — Telephone Encounter (Signed)
Spoke with Monina concerning [facility] call of a change in the eye medication (0.25% to 0.5%). Monina agreed with the pharmacy change.

## 2013-12-17 ENCOUNTER — Emergency Department (HOSPITAL_COMMUNITY): Payer: Medicare Other

## 2013-12-17 ENCOUNTER — Emergency Department (HOSPITAL_COMMUNITY)
Admission: EM | Admit: 2013-12-17 | Discharge: 2013-12-17 | Disposition: A | Discharge status: Home / Self Care | Payer: Medicare Other | Attending: Emergency Medicine | Admitting: Emergency Medicine

## 2013-12-17 ENCOUNTER — Encounter (HOSPITAL_COMMUNITY): Payer: Self-pay | Admitting: Emergency Medicine

## 2013-12-17 DIAGNOSIS — E785 Hyperlipidemia, unspecified: Secondary | ICD-10-CM | POA: Insufficient documentation

## 2013-12-17 DIAGNOSIS — Z87891 Personal history of nicotine dependence: Secondary | ICD-10-CM | POA: Insufficient documentation

## 2013-12-17 DIAGNOSIS — R05 Cough: Secondary | ICD-10-CM | POA: Diagnosis not present

## 2013-12-17 DIAGNOSIS — Z7982 Long term (current) use of aspirin: Secondary | ICD-10-CM | POA: Insufficient documentation

## 2013-12-17 DIAGNOSIS — M1990 Unspecified osteoarthritis, unspecified site: Secondary | ICD-10-CM | POA: Insufficient documentation

## 2013-12-17 DIAGNOSIS — R059 Cough, unspecified: Secondary | ICD-10-CM | POA: Diagnosis not present

## 2013-12-17 DIAGNOSIS — J438 Other emphysema: Secondary | ICD-10-CM | POA: Diagnosis not present

## 2013-12-17 DIAGNOSIS — R609 Edema, unspecified: Secondary | ICD-10-CM

## 2013-12-17 DIAGNOSIS — Z96659 Presence of unspecified artificial knee joint: Secondary | ICD-10-CM | POA: Diagnosis not present

## 2013-12-17 DIAGNOSIS — Z79899 Other long term (current) drug therapy: Secondary | ICD-10-CM | POA: Insufficient documentation

## 2013-12-17 DIAGNOSIS — M7989 Other specified soft tissue disorders: Secondary | ICD-10-CM

## 2013-12-17 LAB — CBC WITH DIFFERENTIAL/PLATELET
BASOS ABS: 0 10*3/uL (ref 0.0–0.1)
BASOS PCT: 0 % (ref 0–1)
EOS ABS: 0.2 10*3/uL (ref 0.0–0.7)
Eosinophils Relative: 3 % (ref 0–5)
HEMATOCRIT: 36.8 % (ref 36.0–46.0)
HEMOGLOBIN: 11.9 g/dL — AB (ref 12.0–15.0)
Lymphocytes Relative: 35 % (ref 12–46)
Lymphs Abs: 2.4 10*3/uL (ref 0.7–4.0)
MCH: 30.6 pg (ref 26.0–34.0)
MCHC: 32.3 g/dL (ref 30.0–36.0)
MCV: 94.6 fL (ref 78.0–100.0)
MONO ABS: 1.1 10*3/uL — AB (ref 0.1–1.0)
MONOS PCT: 16 % — AB (ref 3–12)
NEUTROS ABS: 3.2 10*3/uL (ref 1.7–7.7)
Neutrophils Relative %: 46 % (ref 43–77)
Platelets: 272 10*3/uL (ref 150–400)
RBC: 3.89 MIL/uL (ref 3.87–5.11)
RDW: 14.4 % (ref 11.5–15.5)
WBC: 7 10*3/uL (ref 4.0–10.5)

## 2013-12-17 LAB — BASIC METABOLIC PANEL
BUN: 6 mg/dL (ref 6–23)
CO2: 27 mEq/L (ref 19–32)
Calcium: 9.4 mg/dL (ref 8.4–10.5)
Chloride: 101 mEq/L (ref 96–112)
Creatinine, Ser: 0.63 mg/dL (ref 0.50–1.10)
GFR calc Af Amer: >90 mL/min (ref >90)
GFR, EST NON AFRICAN AMERICAN: 80 mL/min — AB (ref >90)
Glucose, Bld: 95 mg/dL (ref 70–99)
POTASSIUM: 3.9 meq/L (ref 3.7–5.3)
Sodium: 141 mEq/L (ref 137–147)

## 2013-12-17 LAB — PRO B NATRIURETIC PEPTIDE: Pro B Natriuretic peptide (BNP): 440.9 pg/mL (ref 0–450)

## 2013-12-17 NOTE — ED Notes (Addendum)
Pt has right foot and lower leg swelling x several days. Right knee replacement 11/21/13. Went to [facility] today and sent here for evaluation for DVT. Pt reports has not been wearing compression hose. Takes aspirin. Pt able to bear wt, using walker. Denies cp or sob. Pt is ax 4

## 2013-12-17 NOTE — Progress Notes (Signed)
VASCULAR LAB PRELIMINARY  PRELIMINARY  PRELIMINARY  PRELIMINARY  Right lower extremity venous Doppler completed.    Preliminary report:  There is no DVT or SVT noted in the right lower extremity.  Jerald Villalona, RVT 12/17/2013, 7:12 PM

## 2013-12-17 NOTE — ED Provider Notes (Signed)
CSN: [id number]     Arrival date & time 12/17/13  1817 History   First MD Initiated Contact with Patient 12/17/13 1915     Chief Complaint  Patient presents with  . Leg Swelling   (Consider location/radiation/quality/duration/timing/severity/associated sxs/prior Treatment) HPI Comments: Pt had recent R knee replacement. Per pt, home health nurse told pt to go to PCP office for r/o DVT due to inc leg swelling. Per pt, provider "didn't like something she heard in my lungs" and sent to ED for further w/u.  DVT study done PTA negative. She has not been wearing compression stockings since her total R knee replacement 2 weeks ago.   Patient is a 80 y.o. female presenting with leg pain. The history is provided by the patient. No language interpreter was used.  Leg Pain Location:  Knee Time since incident:  2 weeks (R total knee replacement about 2 weeks ago) Injury: no   Knee location:  R knee Pain details:    Quality:  Aching   Radiates to:  Does not radiate   Severity:  Moderate   Onset quality:  Gradual   Progression:  Waxing and waning (worse at end of day) Chronicity:  New Dislocation: no   Tetanus status:  Up to date Relieved by:  Muscle relaxant and rest Worsened by:  Exercise Ineffective treatments:  None tried Associated symptoms: swelling   Associated symptoms: no back pain, no decreased ROM, no fatigue, no fever, no muscle weakness, no neck pain, no numbness, no stiffness and no tingling   Risk factors comment:  Recent surgery   Past Medical History  Diagnosis Date  . Arthritis     oa  . Hyperlipidemia    Past Surgical History  Procedure Laterality Date  . Tonsillectomy  age 41  . Wrist fracture surgery Right   . Benign breast tumor removed  age 12  . Abdominal hysterectomy    . Total knee arthroplasty Right 11/21/2013    Procedure: RIGHT TOTAL KNEE ARTHROPLASTY;  Surgeon: Loanne Drilling, MD;  Location: [facility] ORS;  Service: Orthopedics;  Laterality: Right;   No family  history on file. History  Substance Use Topics  . Smoking status: Former Smoker -- 1.00 packs/day for 17 years    Types: Cigarettes    Quit date: 11/10/1965  . Smokeless tobacco: Never Used  . Alcohol Use: Yes     Comment: very occasional wine   OB History   Grav Para Term Preterm Abortions TAB SAB Ect Mult Living                 Review of Systems  Constitutional: Negative for fever, chills, diaphoresis, activity change, appetite change and fatigue.  HENT: Negative for congestion, facial swelling, rhinorrhea and sore throat.   Eyes: Negative for photophobia and discharge.  Respiratory: Negative for cough, chest tightness and shortness of breath.   Cardiovascular: Positive for leg swelling. Negative for chest pain and palpitations.  Gastrointestinal: Negative for nausea, vomiting, abdominal pain and diarrhea.  Endocrine: Negative for polydipsia and polyuria.  Genitourinary: Negative for dysuria, frequency, difficulty urinating and pelvic pain.  Musculoskeletal: Negative for arthralgias, back pain, neck pain, neck stiffness and stiffness.  Skin: Negative for color change and wound.  Allergic/Immunologic: Negative for immunocompromised state.  Neurological: Negative for facial asymmetry, weakness, numbness and headaches.  Hematological: Does not bruise/bleed easily.  Psychiatric/Behavioral: Negative for confusion and agitation.    Allergies  Review of patient's allergies indicates no known allergies.  Home Medications  Current Outpatient Rx  Name  Route  Sig  Dispense  Refill  . alendronate (FOSAMAX) 70 MG tablet   Oral   Take 70 mg by mouth once a week. Take with a full glass of water on an empty stomach.         Marland Kitchen aspirin 81 MG tablet   Oral   Take 81 mg by mouth daily.          Marland Kitchen atorvastatin (LIPITOR) 20 MG tablet   Oral   Take 20 mg by mouth daily.         . beta carotene w/minerals (OCUVITE) tablet   Oral   Take 1 tablet by mouth daily.         .  cholecalciferol (VITAMIN D) 1000 UNITS tablet   Oral   Take 1,000 Units by mouth daily.         . citalopram (CELEXA) 20 MG tablet   Oral   Take 20 mg by mouth daily with supper.         . methocarbamol (ROBAXIN) 500 MG tablet   Oral   Take 500 mg by mouth at bedtime.         . Omega-3 Fatty Acids (FISH OIL) 1000 MG CAPS   Oral   Take 1,000 mg by mouth every evening.         . psyllium (METAMUCIL) 58.6 % packet   Oral   Take 1 packet by mouth daily.         . timolol (BETIMOL) 0.25 % ophthalmic solution   Both Eyes   Place 1-2 drops into both eyes 2 (two) times daily.          BP 148/67  Pulse 60  Temp(Src) 98.1 F (36.7 C) (Oral)  Resp 15  Ht 5\' 7"  (1.702 m)  Wt 163 lb (73.936 kg)  BMI 25.52 kg/m2  SpO2 100% Physical Exam  Constitutional: She is oriented to person, place, and time. She appears well-developed and well-nourished. No distress.  HENT:  Head: Normocephalic and atraumatic.  Mouth/Throat: No oropharyngeal exudate.  Eyes: Pupils are equal, round, and reactive to light.  Neck: Normal range of motion. Neck supple.  Cardiovascular: Normal rate, regular rhythm and normal heart sounds.  Exam reveals no gallop and no friction rub.   No murmur heard. Pulmonary/Chest: Effort normal. No respiratory distress. She has no wheezes. She has rales in the right lower field and the left lower field.  Abdominal: Soft. Bowel sounds are normal. She exhibits no distension and no mass. There is no tenderness. There is no rebound and no guarding.  Musculoskeletal: Normal range of motion. She exhibits edema (BLLE, 2+RLE, 1+LLE). She exhibits no tenderness.  Neurological: She is alert and oriented to person, place, and time.  Skin: Skin is warm and dry.  Psychiatric: She has a normal mood and affect.    ED Course  Procedures (including critical care time) Labs Review Labs Reviewed  CBC WITH DIFFERENTIAL - Abnormal; Notable for the following:    Hemoglobin 11.9 (*)     Monocytes Relative 16 (*)    Monocytes Absolute 1.1 (*)    All other components within normal limits  BASIC METABOLIC PANEL - Abnormal; Notable for the following:    GFR calc non Af Amer 80 (*)    All other components within normal limits  PRO B NATRIURETIC PEPTIDE   Imaging Review Dg Chest 2 View  12/17/2013   CLINICAL DATA:  Lower extremity edema  EXAM: CHEST  2 VIEW  COMPARISON:  December 17, 2013  FINDINGS: There is a degree of underlying emphysematous change. There is no edema or consolidation. The heart size and pulmonary vascularity are within normal limits. No adenopathy. There is atherosclerotic change in the aorta. There is mid thoracic dextroscoliosis with thoracolumbar levoscoliosis.  IMPRESSION: Underlying emphysematous change. No edema or consolidation. Scoliosis.   Electronically Signed   By: Bretta BangWilliam  Woodruff M.D.   On: 12/17/2013 19:57    EKG Interpretation   None       MDM   1. Peripheral edema    Pt is a 80 y.o. female with Pmhx as above who presents with inc RLE edema since total knee replacement about 2 weeks ago.  Home health nurse asked her to go to PCP office who sent her to the ED.  She states no one told her to wear compression stockings.  Pain is worse at end of the day, swelling has improved since yesterday.  She denies CP, SOB, fever, chills, numbness, weakness, easy fatigue.  Pt had DVT study done prior to ED arrival which was negative for DVT. On PE, VSS, pt in NAD.  She has crackles in BL lung bases.  BLLE edema, R>L.  Incision site c/d/i.  No signs of skin infections.  Have ordered CXR, CBC, BMp, BNP give LE edema and crackles.   CXR w/ underlying emphysema, but no edema. BNP not elevated.  Will rec leg elevation, support stockings, outpt ortho/PCP f/u.  Return precautions given for new or worsening symptoms including infectious s/sx.        Shanna CiscoMegan E Arthella Headings, MD 12/18/13 1341

## 2013-12-17 NOTE — ED Notes (Signed)
Pt states she has been having some pain in the back of her knee that gets worse at night.  Pt states the pain gets as high as a 4, but pt is currently pain free.

## 2013-12-17 NOTE — Discharge Instructions (Signed)

## 2013-12-20 DIAGNOSIS — Z96659 Presence of unspecified artificial knee joint: Secondary | ICD-10-CM | POA: Diagnosis not present

## 2013-12-20 DIAGNOSIS — Z4801 Encounter for change or removal of surgical wound dressing: Secondary | ICD-10-CM | POA: Diagnosis not present

## 2013-12-20 DIAGNOSIS — Z471 Aftercare following joint replacement surgery: Secondary | ICD-10-CM | POA: Diagnosis not present

## 2013-12-21 DIAGNOSIS — Z471 Aftercare following joint replacement surgery: Secondary | ICD-10-CM | POA: Diagnosis not present

## 2013-12-21 DIAGNOSIS — Z96659 Presence of unspecified artificial knee joint: Secondary | ICD-10-CM | POA: Diagnosis not present

## 2013-12-21 DIAGNOSIS — Z4801 Encounter for change or removal of surgical wound dressing: Secondary | ICD-10-CM | POA: Diagnosis not present

## 2013-12-22 DIAGNOSIS — Z96659 Presence of unspecified artificial knee joint: Secondary | ICD-10-CM | POA: Diagnosis not present

## 2013-12-22 DIAGNOSIS — E782 Mixed hyperlipidemia: Secondary | ICD-10-CM | POA: Diagnosis not present

## 2013-12-23 DIAGNOSIS — Z4801 Encounter for change or removal of surgical wound dressing: Secondary | ICD-10-CM | POA: Diagnosis not present

## 2013-12-23 DIAGNOSIS — Z471 Aftercare following joint replacement surgery: Secondary | ICD-10-CM | POA: Diagnosis not present

## 2013-12-23 DIAGNOSIS — Z96659 Presence of unspecified artificial knee joint: Secondary | ICD-10-CM | POA: Diagnosis not present

## 2013-12-29 DIAGNOSIS — Z471 Aftercare following joint replacement surgery: Secondary | ICD-10-CM | POA: Diagnosis not present

## 2013-12-29 DIAGNOSIS — Z96659 Presence of unspecified artificial knee joint: Secondary | ICD-10-CM | POA: Diagnosis not present

## 2013-12-29 DIAGNOSIS — Z4801 Encounter for change or removal of surgical wound dressing: Secondary | ICD-10-CM | POA: Diagnosis not present

## 2013-12-30 DIAGNOSIS — M1710 Unilateral primary osteoarthritis, unspecified knee: Secondary | ICD-10-CM | POA: Diagnosis not present

## 2013-12-30 DIAGNOSIS — Z471 Aftercare following joint replacement surgery: Secondary | ICD-10-CM | POA: Diagnosis not present

## 2014-01-11 DIAGNOSIS — H353290 Exudative age-related macular degeneration, unspecified eye, stage unspecified: Secondary | ICD-10-CM | POA: Diagnosis not present

## 2014-03-17 DIAGNOSIS — R7301 Impaired fasting glucose: Secondary | ICD-10-CM | POA: Diagnosis not present

## 2014-03-17 DIAGNOSIS — Z23 Encounter for immunization: Secondary | ICD-10-CM | POA: Diagnosis not present

## 2014-03-17 DIAGNOSIS — H409 Unspecified glaucoma: Secondary | ICD-10-CM | POA: Diagnosis not present

## 2014-03-17 DIAGNOSIS — M899 Disorder of bone, unspecified: Secondary | ICD-10-CM | POA: Diagnosis not present

## 2014-03-17 DIAGNOSIS — N3946 Mixed incontinence: Secondary | ICD-10-CM | POA: Diagnosis not present

## 2014-03-17 DIAGNOSIS — E559 Vitamin D deficiency, unspecified: Secondary | ICD-10-CM | POA: Diagnosis not present

## 2014-03-17 DIAGNOSIS — E782 Mixed hyperlipidemia: Secondary | ICD-10-CM | POA: Diagnosis not present

## 2014-03-17 DIAGNOSIS — F329 Major depressive disorder, single episode, unspecified: Secondary | ICD-10-CM | POA: Diagnosis not present

## 2014-03-30 DIAGNOSIS — M949 Disorder of cartilage, unspecified: Secondary | ICD-10-CM | POA: Diagnosis not present

## 2014-03-30 DIAGNOSIS — M899 Disorder of bone, unspecified: Secondary | ICD-10-CM | POA: Diagnosis not present

## 2014-05-02 DIAGNOSIS — H353290 Exudative age-related macular degeneration, unspecified eye, stage unspecified: Secondary | ICD-10-CM | POA: Diagnosis not present

## 2014-05-04 DIAGNOSIS — Z471 Aftercare following joint replacement surgery: Secondary | ICD-10-CM | POA: Diagnosis not present

## 2014-05-09 DIAGNOSIS — M810 Age-related osteoporosis without current pathological fracture: Secondary | ICD-10-CM | POA: Diagnosis not present

## 2014-05-09 DIAGNOSIS — H6120 Impacted cerumen, unspecified ear: Secondary | ICD-10-CM | POA: Diagnosis not present

## 2014-07-18 DIAGNOSIS — M810 Age-related osteoporosis without current pathological fracture: Secondary | ICD-10-CM | POA: Diagnosis not present

## 2014-07-18 DIAGNOSIS — Z7189 Other specified counseling: Secondary | ICD-10-CM | POA: Diagnosis not present

## 2014-07-26 DIAGNOSIS — H43819 Vitreous degeneration, unspecified eye: Secondary | ICD-10-CM | POA: Diagnosis not present

## 2014-07-26 DIAGNOSIS — H353290 Exudative age-related macular degeneration, unspecified eye, stage unspecified: Secondary | ICD-10-CM | POA: Diagnosis not present

## 2014-07-26 DIAGNOSIS — Z23 Encounter for immunization: Secondary | ICD-10-CM | POA: Diagnosis not present

## 2014-09-10 IMAGING — CR DG CHEST 2V
2 series · 2 of 2 positions shown · non-contrast
Comparison: December 17, 2013

CLINICAL DATA: Lower extremity edema

EXAM:
CHEST  2 VIEW

[w chest pa]
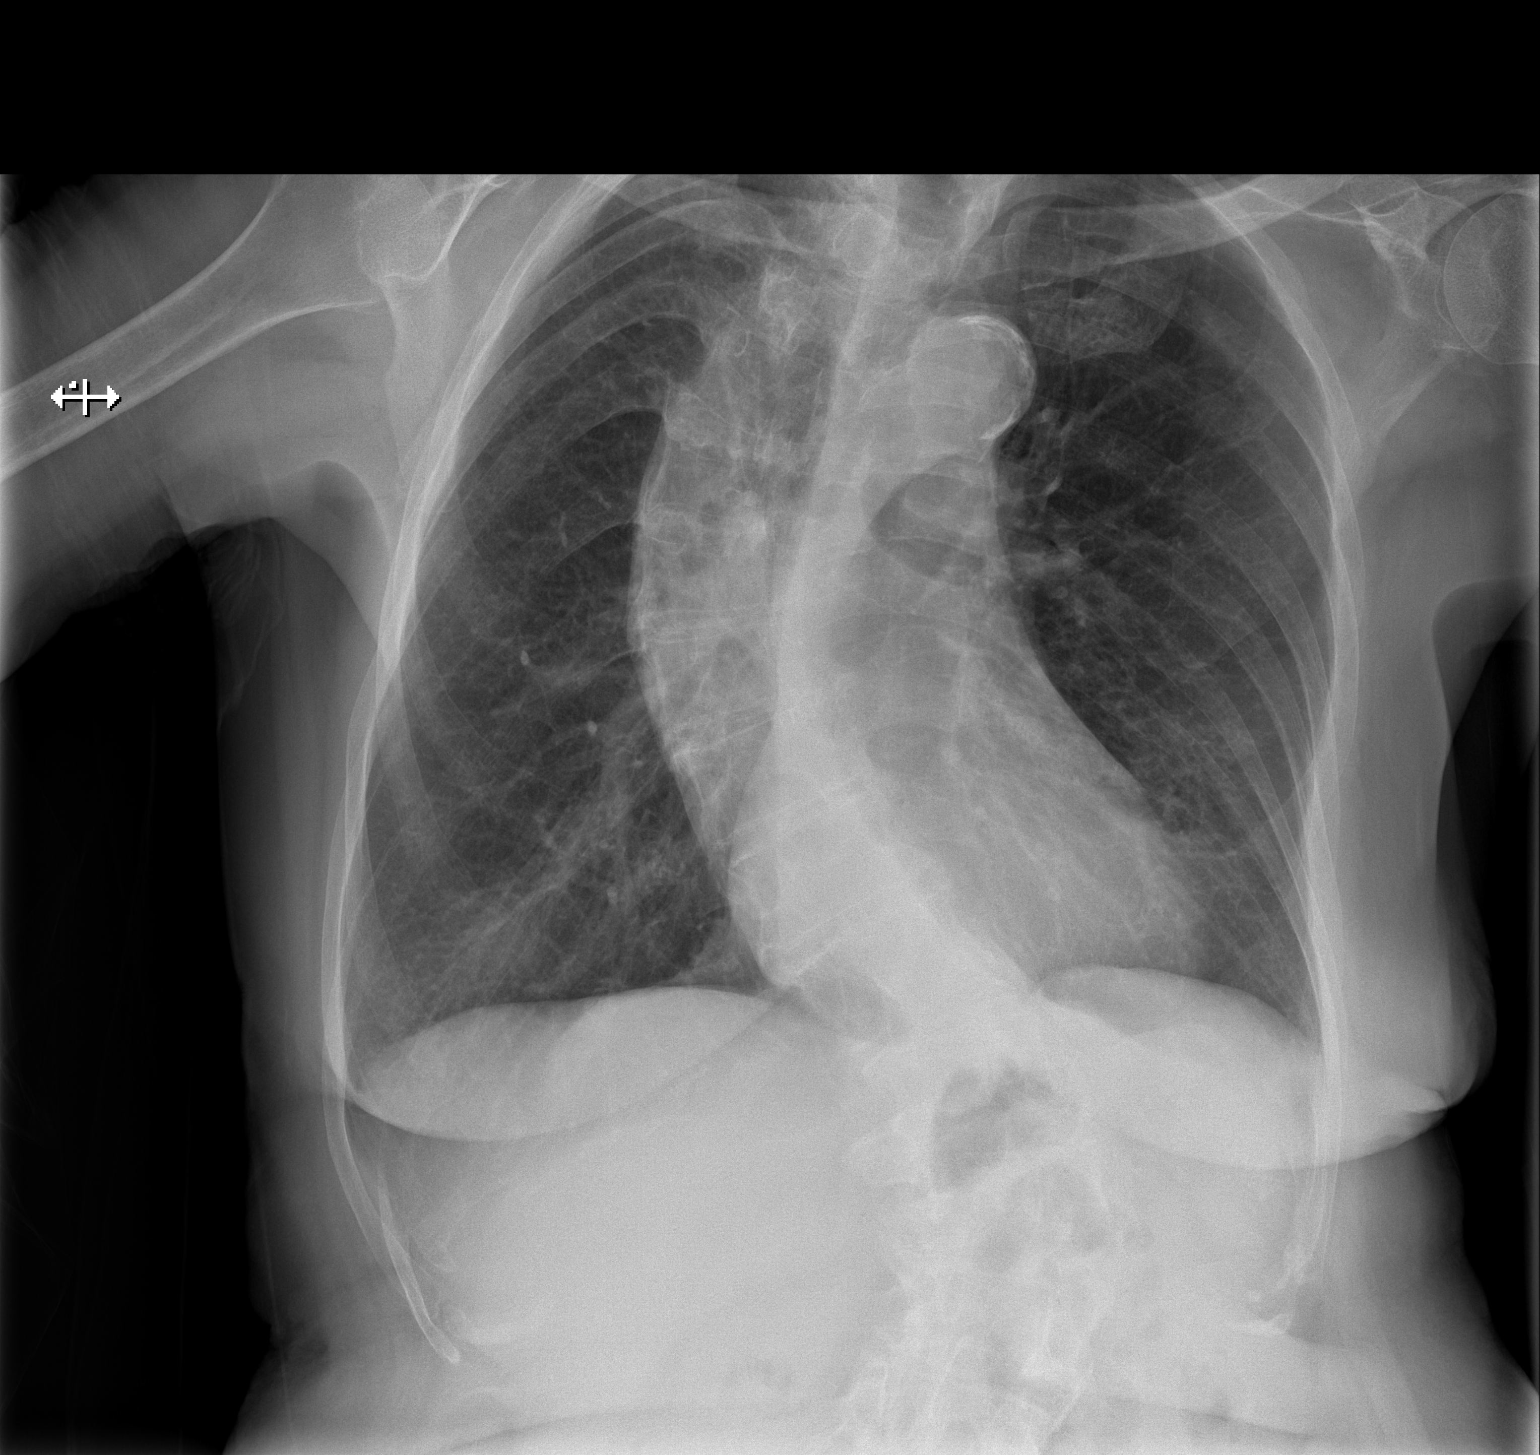

[w chest lat]
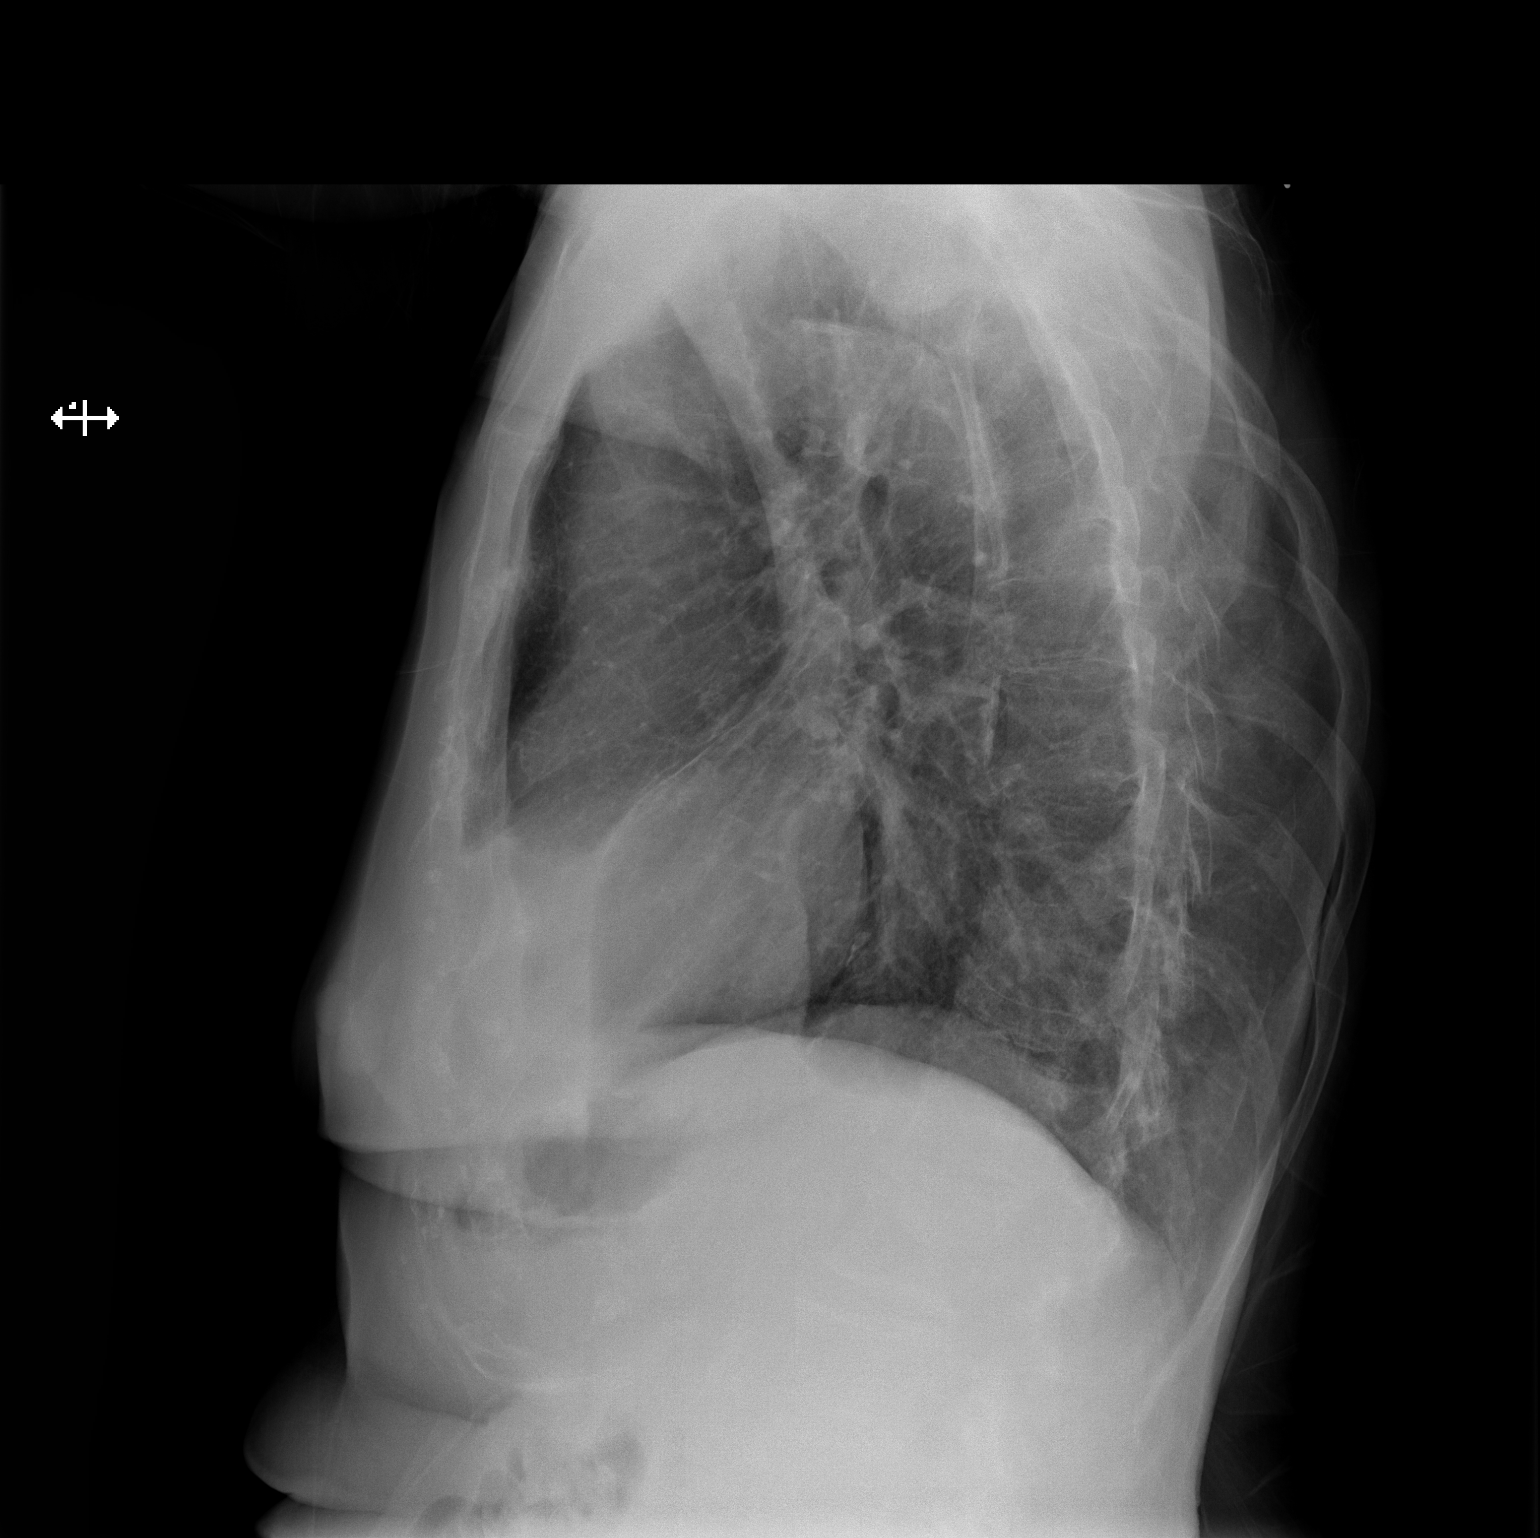

[2 of 2 positions shown; findings below may reference images not displayed]

FINDINGS: There is a degree of underlying emphysematous change. There is no
edema or consolidation. The heart size and pulmonary vascularity are
within normal limits. No adenopathy. There is atherosclerotic change
in the aorta. There is mid thoracic dextroscoliosis with
thoracolumbar levoscoliosis.
IMPRESSION: Underlying emphysematous change. No edema or consolidation.
Scoliosis.

## 2014-10-19 DIAGNOSIS — H3532 Exudative age-related macular degeneration: Secondary | ICD-10-CM | POA: Diagnosis not present

## 2014-10-19 DIAGNOSIS — Z961 Presence of intraocular lens: Secondary | ICD-10-CM | POA: Diagnosis not present

## 2014-10-19 DIAGNOSIS — H31013 Macula scars of posterior pole (postinflammatory) (post-traumatic), bilateral: Secondary | ICD-10-CM | POA: Diagnosis not present

## 2014-10-19 DIAGNOSIS — H4011X2 Primary open-angle glaucoma, moderate stage: Secondary | ICD-10-CM | POA: Diagnosis not present

## 2014-10-24 DIAGNOSIS — H43813 Vitreous degeneration, bilateral: Secondary | ICD-10-CM | POA: Diagnosis not present

## 2014-10-24 DIAGNOSIS — H3532 Exudative age-related macular degeneration: Secondary | ICD-10-CM | POA: Diagnosis not present

## 2014-11-14 ENCOUNTER — Emergency Department (HOSPITAL_COMMUNITY): Payer: Medicare Other

## 2014-11-14 ENCOUNTER — Encounter (HOSPITAL_COMMUNITY): Payer: Self-pay | Admitting: Emergency Medicine

## 2014-11-14 ENCOUNTER — Emergency Department (HOSPITAL_COMMUNITY)
Admission: EM | Admit: 2014-11-14 | Discharge: 2014-11-14 | Disposition: A | Discharge status: Home / Self Care | Payer: Medicare Other | Attending: Emergency Medicine | Admitting: Emergency Medicine

## 2014-11-14 DIAGNOSIS — N138 Other obstructive and reflux uropathy: Secondary | ICD-10-CM | POA: Diagnosis not present

## 2014-11-14 DIAGNOSIS — N202 Calculus of kidney with calculus of ureter: Secondary | ICD-10-CM | POA: Diagnosis not present

## 2014-11-14 DIAGNOSIS — E785 Hyperlipidemia, unspecified: Secondary | ICD-10-CM | POA: Insufficient documentation

## 2014-11-14 DIAGNOSIS — R112 Nausea with vomiting, unspecified: Secondary | ICD-10-CM | POA: Diagnosis not present

## 2014-11-14 DIAGNOSIS — Z79899 Other long term (current) drug therapy: Secondary | ICD-10-CM | POA: Diagnosis not present

## 2014-11-14 DIAGNOSIS — K2970 Gastritis, unspecified, without bleeding: Secondary | ICD-10-CM | POA: Diagnosis not present

## 2014-11-14 DIAGNOSIS — R109 Unspecified abdominal pain: Secondary | ICD-10-CM | POA: Diagnosis not present

## 2014-11-14 DIAGNOSIS — M1990 Unspecified osteoarthritis, unspecified site: Secondary | ICD-10-CM | POA: Diagnosis not present

## 2014-11-14 DIAGNOSIS — N200 Calculus of kidney: Secondary | ICD-10-CM | POA: Insufficient documentation

## 2014-11-14 DIAGNOSIS — Z87891 Personal history of nicotine dependence: Secondary | ICD-10-CM | POA: Diagnosis not present

## 2014-11-14 DIAGNOSIS — N281 Cyst of kidney, acquired: Secondary | ICD-10-CM | POA: Diagnosis not present

## 2014-11-14 DIAGNOSIS — K5730 Diverticulosis of large intestine without perforation or abscess without bleeding: Secondary | ICD-10-CM | POA: Diagnosis not present

## 2014-11-14 DIAGNOSIS — Z7982 Long term (current) use of aspirin: Secondary | ICD-10-CM | POA: Insufficient documentation

## 2014-11-14 LAB — CBC WITH DIFFERENTIAL/PLATELET
BASOS ABS: 0 10*3/uL (ref 0.0–0.1)
BASOS PCT: 0 % (ref 0–1)
EOS ABS: 0.1 10*3/uL (ref 0.0–0.7)
EOS PCT: 1 % (ref 0–5)
HCT: 42.9 % (ref 36.0–46.0)
Hemoglobin: 13.6 g/dL (ref 12.0–15.0)
Lymphocytes Relative: 14 % (ref 12–46)
Lymphs Abs: 1.6 10*3/uL (ref 0.7–4.0)
MCH: 29.9 pg (ref 26.0–34.0)
MCHC: 31.7 g/dL (ref 30.0–36.0)
MCV: 94.3 fL (ref 78.0–100.0)
MONO ABS: 0.6 10*3/uL (ref 0.1–1.0)
Monocytes Relative: 6 % (ref 3–12)
Neutro Abs: 9.2 10*3/uL — ABNORMAL HIGH (ref 1.7–7.7)
Neutrophils Relative %: 79 % — ABNORMAL HIGH (ref 43–77)
Platelets: 254 10*3/uL (ref 150–400)
RBC: 4.55 MIL/uL (ref 3.87–5.11)
RDW: 13.1 % (ref 11.5–15.5)
WBC: 11.4 10*3/uL — ABNORMAL HIGH (ref 4.0–10.5)

## 2014-11-14 LAB — I-STAT CHEM 8, ED
BUN: 15 mg/dL (ref 6–23)
CALCIUM ION: 1.31 mmol/L — AB (ref 1.13–1.30)
CHLORIDE: 97 meq/L (ref 96–112)
Creatinine, Ser: 0.9 mg/dL (ref 0.50–1.10)
Glucose, Bld: 148 mg/dL — ABNORMAL HIGH (ref 70–99)
HCT: 45 % (ref 36.0–46.0)
Hemoglobin: 15.3 g/dL — ABNORMAL HIGH (ref 12.0–15.0)
Potassium: 4.1 mmol/L (ref 3.5–5.1)
Sodium: 139 mmol/L (ref 135–145)
TCO2: 28 mmol/L (ref 0–100)

## 2014-11-14 LAB — URINALYSIS, ROUTINE W REFLEX MICROSCOPIC
Bilirubin Urine: NEGATIVE
Glucose, UA: NEGATIVE mg/dL
Ketones, ur: NEGATIVE mg/dL
LEUKOCYTES UA: NEGATIVE
NITRITE: NEGATIVE
Protein, ur: NEGATIVE mg/dL
Specific Gravity, Urine: 1.009 (ref 1.005–1.030)
Urobilinogen, UA: 0.2 mg/dL (ref 0.0–1.0)
pH: 8 (ref 5.0–8.0)

## 2014-11-14 LAB — COMPREHENSIVE METABOLIC PANEL
ALT: 16 U/L (ref 0–35)
ANION GAP: 9 (ref 5–15)
AST: 22 U/L (ref 0–37)
Albumin: 4.3 g/dL (ref 3.5–5.2)
Alkaline Phosphatase: 63 U/L (ref 39–117)
BUN: 15 mg/dL (ref 6–23)
CO2: 32 mmol/L (ref 19–32)
Calcium: 10.5 mg/dL (ref 8.4–10.5)
Chloride: 95 mEq/L — ABNORMAL LOW (ref 96–112)
Creatinine, Ser: 0.82 mg/dL (ref 0.50–1.10)
GFR calc non Af Amer: 63 mL/min — ABNORMAL LOW (ref >90)
GFR, EST AFRICAN AMERICAN: 74 mL/min — AB (ref >90)
GLUCOSE: 149 mg/dL — AB (ref 70–99)
Potassium: 3.9 mmol/L (ref 3.5–5.1)
SODIUM: 136 mmol/L (ref 135–145)
Total Bilirubin: 0.9 mg/dL (ref 0.3–1.2)
Total Protein: 7.6 g/dL (ref 6.0–8.3)

## 2014-11-14 LAB — URINE MICROSCOPIC-ADD ON

## 2014-11-14 LAB — LIPASE, BLOOD: Lipase: 30 U/L (ref 11–59)

## 2014-11-14 LAB — I-STAT CG4 LACTIC ACID, ED: Lactic Acid, Venous: 1.02 mmol/L (ref 0.5–2.2)

## 2014-11-14 MED ORDER — IOHEXOL 300 MG/ML  SOLN
100.0000 mL | Freq: Once | INTRAMUSCULAR | Status: AC | PRN
  Administered 2014-11-14: 100 mL via INTRAVENOUS

## 2014-11-14 MED ORDER — IOHEXOL 300 MG/ML  SOLN
50.0000 mL | Freq: Once | INTRAMUSCULAR | Status: AC | PRN
  Administered 2014-11-14: 50 mL via ORAL

## 2014-11-14 MED ORDER — SODIUM CHLORIDE 0.9 % IV BOLUS (SEPSIS)
1000.0000 mL | Freq: Once | INTRAVENOUS | Status: AC
  Administered 2014-11-14: 1000 mL via INTRAVENOUS

## 2014-11-14 NOTE — ED Notes (Addendum)
Awake. Verbally responsive. A/O x4. Resp even and unlabored. No audible adventitious breath sounds noted. ABC's intact. SR on monitor at 69bpm. IV infusing NS at 99399ml/hr without difficulty. NAD noted.

## 2014-11-14 NOTE — ED Notes (Signed)
Pt returned from CT scan without distress noted. 

## 2014-11-14 NOTE — Discharge Instructions (Signed)
Kidney Stones Gabrielle [name], using today for back pain. Your CT scan shows a 5 mm kidney stone on the right side. Continue to take Motrin as needed at home for pain. Follow-up with urology within 3 days for continued management. Normally, the stone will pass without any intervention. If you have any worsening symptoms come back to emergency department immediately. Thank you. Kidney stones (urolithiasis) are solid masses that form inside your kidneys. The intense pain is caused by the stone moving through the kidney, ureter, bladder, and urethra (urinary tract). When the stone moves, the ureter starts to spasm around the stone. The stone is usually passed in your pee (urine).  HOME CARE  Drink enough fluids to keep your pee clear or pale yellow. This helps to get the stone out.  Strain all pee through the provided strainer. Do not pee without peeing through the strainer, not even once. If you pee the stone out, catch it in the strainer. The stone may be as small as a grain of salt. Take this to your doctor. This will help your doctor figure out what you can do to try to prevent more kidney stones.  Only take medicine as told by your doctor.  Follow up with your doctor as told.  Get follow-up X-rays as told by your doctor. GET HELP IF: You have pain that gets worse even if you have been taking pain medicine. GET HELP RIGHT AWAY IF:   Your pain does not get better with medicine.  You have a fever or shaking chills.  Your pain increases and gets worse over 18 hours.  You have new belly (abdominal) pain.  You feel faint or pass out.  You are unable to pee. MAKE SURE YOU:   Understand these instructions.  Will watch your condition.  Will get help right away if you are not doing well or get worse. Document Released: 04/14/2008 Document Revised: 06/29/2013 Document Reviewed: 03/30/2013 [facility] Patient Information 2015 [employer] [place]. This information is not intended to replace advice  given to you by your health care provider. Make sure you discuss any questions you have with your health care provider.

## 2014-11-14 NOTE — ED Notes (Signed)
Patient transported to CT 

## 2014-11-14 NOTE — ED Notes (Signed)
Bed: WA06 Expected date:  Expected time:  Means of arrival:  Comments: 

## 2014-11-14 NOTE — ED Provider Notes (Signed)
CSN: [id number]     Arrival date & time 11/14/14  0335 History   First MD Initiated Contact with Patient 11/14/14 [phone]     Chief Complaint  Patient presents with  . Emesis  . Flank Pain     (Consider location/radiation/quality/duration/timing/severity/associated sxs/prior Treatment) HPI  Gabrielle [name] is a 81 y.o. female with past medical history of hyperlipidemia and arthritis coming in with right-sided flank pain. Patient states she was watching TV at home when this occurred sudden onset. She denies this ever happening in the past. She had nausea and vomiting 5 episodes and diarrhea 5 as well. She denies any dysuria or hematuria, she has no history of kidney stones. Patient denies any recent infections or fevers. Nothing makes the symptoms better or worse. In route EMS gave the patient for now, her oxygen saturation dropped to 84% on room air and she was placed on 3 L nasal cannula.  Patient currently states her symptoms have resolved, she has no pain or nausea. Patient has no further complaints.    10 Systems reviewed and are negative for acute change except as noted in the HPI.   Past Medical History  Diagnosis Date  . Arthritis     oa  . Hyperlipidemia    Past Surgical History  Procedure Laterality Date  . Tonsillectomy  age 39  . Wrist fracture surgery Right   . Benign breast tumor removed  age 67  . Abdominal hysterectomy    . Total knee arthroplasty Right 11/21/2013    Procedure: RIGHT TOTAL KNEE ARTHROPLASTY;  Surgeon: Loanne Drilling, MD;  Location: [facility];  Service: Orthopedics;  Laterality: Right;   No family history on file. History  Substance Use Topics  . Smoking status: Former Smoker -- 1.00 packs/day for 17 years    Types: Cigarettes    Quit date: 11/10/1965  . Smokeless tobacco: Never Used  . Alcohol Use: Yes     Comment: very occasional wine   OB History    No data available     Review of Systems    Allergies  Review of patient's allergies  indicates no known allergies.  Home Medications   Prior to Admission medications   Medication Sig Start Date End Date Taking? Authorizing Provider  alendronate (FOSAMAX) 70 MG tablet Take 70 mg by mouth once a week. Take with a full glass of water on an empty stomach.   Yes Historical Provider, MD  aspirin 81 MG tablet Take 81 mg by mouth every Monday, Wednesday, and Friday.    Yes Historical Provider, MD  atorvastatin (LIPITOR) 20 MG tablet Take 20 mg by mouth daily.   Yes Historical Provider, MD  beta carotene w/minerals (OCUVITE) tablet Take 1 tablet by mouth daily.   Yes Historical Provider, MD  cholecalciferol (VITAMIN D) 1000 UNITS tablet Take 1,000 Units by mouth daily.   Yes Historical Provider, MD  citalopram (CELEXA) 20 MG tablet Take 20 mg by mouth daily with supper.   Yes Historical Provider, MD  methocarbamol (ROBAXIN) 500 MG tablet Take 500 mg by mouth at bedtime.   Yes Historical Provider, MD  Omega-3 Fatty Acids (FISH OIL) 1000 MG CAPS Take 1,000 mg by mouth every evening.   Yes Historical Provider, MD  polycarbophil (FIBERCON) 625 MG tablet Take 625 mg by mouth daily.   Yes Historical Provider, MD  timolol (BETIMOL) 0.25 % ophthalmic solution Place 1-2 drops into both eyes 2 (two) times daily.   Yes Historical Provider, MD  psyllium (METAMUCIL) 58.6 % packet Take 1 packet by mouth daily.    Historical Provider, MD   BP 166/73 mmHg  Pulse 61  Temp(Src) 97.7 F (36.5 C) (Oral)  Resp 18  Ht 5\' 8"  (1.727 m)  Wt 164 lb (74.39 kg)  BMI 24.94 kg/m2  SpO2 93% Physical Exam  Constitutional: She is oriented to person, place, and time. She appears well-developed and well-nourished. No distress.  HENT:  Head: Normocephalic and atraumatic.  Nose: Nose normal.  Mouth/Throat: Oropharynx is clear and moist. No oropharyngeal exudate.  Eyes: Conjunctivae and EOM are normal. Pupils are equal, round, and reactive to light. No scleral icterus.  Neck: Normal range of motion. Neck supple.  No JVD present. No tracheal deviation present. No thyromegaly present.  Cardiovascular: Normal rate, regular rhythm and normal heart sounds.  Exam reveals no gallop and no friction rub.   No murmur heard. Pulmonary/Chest: Effort normal and breath sounds normal. No respiratory distress. She has no wheezes. She exhibits no tenderness.  Abdominal: Soft. Bowel sounds are normal. She exhibits no distension and no mass. There is no tenderness. There is no rebound and no guarding.  Musculoskeletal: Normal range of motion. She exhibits no edema or tenderness.  Lymphadenopathy:    She has no cervical adenopathy.  Neurological: She is alert and oriented to person, place, and time. No cranial nerve deficit. She exhibits normal muscle tone.  Skin: Skin is warm and dry. No rash noted. She is not diaphoretic. No erythema. No pallor.  Nursing note and vitals reviewed.   ED Course  Procedures (including critical care time) Labs Review Labs Reviewed  CBC WITH DIFFERENTIAL - Abnormal; Notable for the following:    WBC 11.4 (*)    Neutrophils Relative % 79 (*)    Neutro Abs 9.2 (*)    All other components within normal limits  COMPREHENSIVE METABOLIC PANEL - Abnormal; Notable for the following:    Chloride 95 (*)    Glucose, Bld 149 (*)    GFR calc non Af Amer 63 (*)    GFR calc Af Amer 74 (*)    All other components within normal limits  URINALYSIS, ROUTINE W REFLEX MICROSCOPIC - Abnormal; Notable for the following:    APPearance CLOUDY (*)    Hgb urine dipstick SMALL (*)    All other components within normal limits  I-STAT CHEM 8, ED - Abnormal; Notable for the following:    Glucose, Bld 148 (*)    Calcium, Ion 1.31 (*)    Hemoglobin 15.3 (*)    All other components within normal limits  URINE CULTURE  LIPASE, BLOOD  URINE MICROSCOPIC-ADD ON  I-STAT CG4 LACTIC ACID, ED    Imaging Review Ct Abdomen Pelvis W Contrast  11/14/2014   CLINICAL DATA:  Right flank pain. Nausea, vomiting, diarrhea.  Microhematuria. White cell count 11.4.  EXAM: CT ABDOMEN AND PELVIS WITH CONTRAST  TECHNIQUE: Multidetector CT imaging of the abdomen and pelvis was performed using the standard protocol following bolus administration of intravenous contrast.  CONTRAST:  100mL OMNIPAQUE IOHEXOL 300 MG/ML  SOLN  COMPARISON:  03/09/2006  FINDINGS: Mild dependent changes in the lung bases.  Circumscribed low-attenuation changes throughout the liver, largest in the lateral segment left lobe measuring 1.6 x 2.3 cm. Lesions were present previously but have enlarged. Appearance is most consistent with cysts. The gallbladder, spleen, pancreas, adrenal glands, inferior vena cava, and retroperitoneal lymph nodes are unremarkable. Calcification of aorta without aneurysm. 5 mm stone in the  distal right ureter just above the ureterovesical junction with proximal hydronephrosis and hydroureter. No significant perirenal stranding. Multiple additional cysts in the right kidney without obstruction. Subcentimeter cysts on the left kidney. No stone or hydronephrosis on the left. Stomach, small bowel, and colon are grossly normal. Under distention limits evaluation. No free air or free fluid in the abdomen.  Pelvis: Appendix is normal. Diverticulosis of the sigmoid colon. No evidence of diverticulitis. Bladder wall is not thickened. No free or loculated pelvic fluid collections. Surgical absence of the uterus. No pelvic mass or lymphadenopathy. Degenerative changes and scoliosis of the lumbar spine. No destructive bone lesions.  IMPRESSION: 5 mm stone in the distal right ureter with moderate proximal obstruction. Digital nonobstructing intrarenal stones on the right. Multiple circumscribed hypodense lesions in the liver consistent with cysts although enlarging since previous study. Diverticulosis of sigmoid colon without evidence of diverticulitis.   Electronically Signed   By: Burman Nieves M.D.   On: 11/14/2014 06:28     EKG  Interpretation None      MDM   Final diagnoses:  Abdominal pain    Patient presents emergency department for sudden onset right flank pain. She denies any history of nephrolithiasis, history appears to be consistent with this diagnosis however. Will obtain urinalysis and CT scan for evaluation.  CT was ordered with  Contrast to evaluate her abdomen, will assess for hydronephrosis as well.     CT scan reveals 5 mm stone in the distal right ureter with moderate proximal instruction. Patient continues to be asymptomatic, she has no pain or nausea currently. She was educated on this diagnosis and encouraged to follow-up with urology within the next 3 days. Her vital signs remain within her normal limits and she is safe for discharge.   Tomasita Crumble, MD 11/14/14 [phone]

## 2014-11-14 NOTE — ED Notes (Signed)
Awake. Verbally responsive. A/O x4. Resp even and unlabored. No audible adventitious breath sounds noted. ABC's intact.  

## 2014-11-14 NOTE — ED Notes (Signed)
Pt's granddaughter, Duwayne HeckDanielle, [phone].  Blanch RN called about transporting pt home, no answer and no voicemail option.

## 2014-11-14 NOTE — ED Notes (Signed)
Pt arrived via EMS with c/o of dizziness, N/V x 5 and diarrhea x5. Pt reported rt flank pain also started at same time. En-route EMS given Zofran 4mg  and Fentanyl 50mcg which desated pt to 84% RA. Pt was started on O2 at 3lpm via [place] and sats increase to 94%.

## 2014-11-14 NOTE — ED Notes (Signed)
[facility] [phone], to see if their transportation could pick pt up.  Staff member stated that pt's normally come back by EMS if they left by EMS.  He would have to find out if they would be able to come get pt or not.

## 2014-11-15 DIAGNOSIS — N201 Calculus of ureter: Secondary | ICD-10-CM | POA: Diagnosis not present

## 2014-11-15 DIAGNOSIS — N1330 Unspecified hydronephrosis: Secondary | ICD-10-CM | POA: Diagnosis not present

## 2014-11-15 DIAGNOSIS — N200 Calculus of kidney: Secondary | ICD-10-CM | POA: Diagnosis not present

## 2014-11-15 LAB — URINE CULTURE
Colony Count: NO GROWTH
Culture: NO GROWTH

## 2014-11-30 DIAGNOSIS — N200 Calculus of kidney: Secondary | ICD-10-CM | POA: Diagnosis not present

## 2014-11-30 DIAGNOSIS — N201 Calculus of ureter: Secondary | ICD-10-CM | POA: Diagnosis not present

## 2015-01-22 DIAGNOSIS — H3532 Exudative age-related macular degeneration: Secondary | ICD-10-CM | POA: Diagnosis not present

## 2015-01-26 DIAGNOSIS — H4011X2 Primary open-angle glaucoma, moderate stage: Secondary | ICD-10-CM | POA: Diagnosis not present

## 2015-01-29 ENCOUNTER — Other Ambulatory Visit: Payer: Self-pay | Admitting: Family Medicine

## 2015-01-29 DIAGNOSIS — R413 Other amnesia: Secondary | ICD-10-CM

## 2015-01-29 DIAGNOSIS — F329 Major depressive disorder, single episode, unspecified: Secondary | ICD-10-CM | POA: Diagnosis not present

## 2015-01-29 DIAGNOSIS — M810 Age-related osteoporosis without current pathological fracture: Secondary | ICD-10-CM | POA: Diagnosis not present

## 2015-01-29 DIAGNOSIS — R7301 Impaired fasting glucose: Secondary | ICD-10-CM | POA: Diagnosis not present

## 2015-01-29 DIAGNOSIS — H3530 Unspecified macular degeneration: Secondary | ICD-10-CM | POA: Diagnosis not present

## 2015-01-29 DIAGNOSIS — H409 Unspecified glaucoma: Secondary | ICD-10-CM | POA: Diagnosis not present

## 2015-01-29 DIAGNOSIS — E782 Mixed hyperlipidemia: Secondary | ICD-10-CM | POA: Diagnosis not present

## 2015-01-29 DIAGNOSIS — E559 Vitamin D deficiency, unspecified: Secondary | ICD-10-CM | POA: Diagnosis not present

## 2015-02-12 ENCOUNTER — Ambulatory Visit
Admission: RE | Admit: 2015-02-12 | Discharge: 2015-02-12 | Disposition: A | Discharge status: Home / Self Care | Payer: Medicare Other | Source: Ambulatory Visit | Attending: Family Medicine | Admitting: Family Medicine

## 2015-02-12 DIAGNOSIS — R413 Other amnesia: Secondary | ICD-10-CM

## 2015-03-07 DIAGNOSIS — G3184 Mild cognitive impairment, so stated: Secondary | ICD-10-CM | POA: Diagnosis not present

## 2015-03-19 DIAGNOSIS — H4011X2 Primary open-angle glaucoma, moderate stage: Secondary | ICD-10-CM | POA: Diagnosis not present

## 2015-03-26 DIAGNOSIS — H3532 Exudative age-related macular degeneration: Secondary | ICD-10-CM | POA: Diagnosis not present

## 2015-03-26 DIAGNOSIS — H31011 Macula scars of posterior pole (postinflammatory) (post-traumatic), right eye: Secondary | ICD-10-CM | POA: Diagnosis not present

## 2015-03-26 DIAGNOSIS — H43813 Vitreous degeneration, bilateral: Secondary | ICD-10-CM | POA: Diagnosis not present

## 2015-04-13 DIAGNOSIS — H4011X2 Primary open-angle glaucoma, moderate stage: Secondary | ICD-10-CM | POA: Diagnosis not present

## 2015-04-16 DIAGNOSIS — M810 Age-related osteoporosis without current pathological fracture: Secondary | ICD-10-CM | POA: Diagnosis not present

## 2015-04-27 DIAGNOSIS — H4011X2 Primary open-angle glaucoma, moderate stage: Secondary | ICD-10-CM | POA: Diagnosis not present

## 2015-06-19 DIAGNOSIS — G3184 Mild cognitive impairment, so stated: Secondary | ICD-10-CM | POA: Diagnosis not present

## 2015-06-22 DIAGNOSIS — Z23 Encounter for immunization: Secondary | ICD-10-CM | POA: Diagnosis not present

## 2015-06-25 DIAGNOSIS — R350 Frequency of micturition: Secondary | ICD-10-CM | POA: Diagnosis not present

## 2015-07-30 DIAGNOSIS — H43813 Vitreous degeneration, bilateral: Secondary | ICD-10-CM | POA: Diagnosis not present

## 2015-07-30 DIAGNOSIS — H3532 Exudative age-related macular degeneration: Secondary | ICD-10-CM | POA: Diagnosis not present

## 2015-07-30 DIAGNOSIS — H31011 Macula scars of posterior pole (postinflammatory) (post-traumatic), right eye: Secondary | ICD-10-CM | POA: Diagnosis not present

## 2015-08-03 ENCOUNTER — Encounter (HOSPITAL_COMMUNITY): Payer: Self-pay | Admitting: Internal Medicine

## 2015-08-03 ENCOUNTER — Observation Stay (HOSPITAL_COMMUNITY)
Admission: EM | Admit: 2015-08-03 | Discharge: 2015-08-04 | Disposition: A | Discharge status: Home / Self Care | Payer: Medicare Other | Attending: Internal Medicine | Admitting: Internal Medicine

## 2015-08-03 ENCOUNTER — Observation Stay (HOSPITAL_COMMUNITY): Payer: Medicare Other

## 2015-08-03 ENCOUNTER — Emergency Department (HOSPITAL_COMMUNITY): Payer: Medicare Other

## 2015-08-03 DIAGNOSIS — W1830XA Fall on same level, unspecified, initial encounter: Secondary | ICD-10-CM | POA: Diagnosis not present

## 2015-08-03 DIAGNOSIS — E785 Hyperlipidemia, unspecified: Secondary | ICD-10-CM | POA: Insufficient documentation

## 2015-08-03 DIAGNOSIS — I340 Nonrheumatic mitral (valve) insufficiency: Secondary | ICD-10-CM | POA: Diagnosis present

## 2015-08-03 DIAGNOSIS — I440 Atrioventricular block, first degree: Secondary | ICD-10-CM | POA: Diagnosis not present

## 2015-08-03 DIAGNOSIS — R55 Syncope and collapse: Secondary | ICD-10-CM | POA: Diagnosis not present

## 2015-08-03 DIAGNOSIS — I10 Essential (primary) hypertension: Secondary | ICD-10-CM | POA: Insufficient documentation

## 2015-08-03 DIAGNOSIS — H409 Unspecified glaucoma: Secondary | ICD-10-CM | POA: Insufficient documentation

## 2015-08-03 DIAGNOSIS — M1710 Unilateral primary osteoarthritis, unspecified knee: Secondary | ICD-10-CM | POA: Diagnosis present

## 2015-08-03 DIAGNOSIS — Z87891 Personal history of nicotine dependence: Secondary | ICD-10-CM | POA: Diagnosis not present

## 2015-08-03 DIAGNOSIS — F32A Depression, unspecified: Secondary | ICD-10-CM | POA: Diagnosis present

## 2015-08-03 DIAGNOSIS — I351 Nonrheumatic aortic (valve) insufficiency: Secondary | ICD-10-CM | POA: Diagnosis present

## 2015-08-03 DIAGNOSIS — K5900 Constipation, unspecified: Secondary | ICD-10-CM | POA: Diagnosis not present

## 2015-08-03 DIAGNOSIS — F328 Other depressive episodes: Secondary | ICD-10-CM | POA: Diagnosis not present

## 2015-08-03 DIAGNOSIS — Z96651 Presence of right artificial knee joint: Secondary | ICD-10-CM | POA: Insufficient documentation

## 2015-08-03 DIAGNOSIS — I080 Rheumatic disorders of both mitral and aortic valves: Secondary | ICD-10-CM | POA: Diagnosis not present

## 2015-08-03 DIAGNOSIS — M179 Osteoarthritis of knee, unspecified: Secondary | ICD-10-CM | POA: Insufficient documentation

## 2015-08-03 DIAGNOSIS — Y92002 Bathroom of unspecified non-institutional (private) residence single-family (private) house as the place of occurrence of the external cause: Secondary | ICD-10-CM | POA: Diagnosis not present

## 2015-08-03 DIAGNOSIS — E7800 Pure hypercholesterolemia, unspecified: Secondary | ICD-10-CM | POA: Diagnosis present

## 2015-08-03 DIAGNOSIS — F329 Major depressive disorder, single episode, unspecified: Secondary | ICD-10-CM

## 2015-08-03 DIAGNOSIS — R569 Unspecified convulsions: Secondary | ICD-10-CM | POA: Diagnosis not present

## 2015-08-03 DIAGNOSIS — Z7982 Long term (current) use of aspirin: Secondary | ICD-10-CM | POA: Insufficient documentation

## 2015-08-03 DIAGNOSIS — Z79899 Other long term (current) drug therapy: Secondary | ICD-10-CM | POA: Insufficient documentation

## 2015-08-03 DIAGNOSIS — R42 Dizziness and giddiness: Secondary | ICD-10-CM | POA: Diagnosis present

## 2015-08-03 DIAGNOSIS — H40819 Glaucoma with increased episcleral venous pressure, unspecified eye: Secondary | ICD-10-CM | POA: Diagnosis not present

## 2015-08-03 DIAGNOSIS — R0602 Shortness of breath: Secondary | ICD-10-CM | POA: Diagnosis not present

## 2015-08-03 DIAGNOSIS — R404 Transient alteration of awareness: Secondary | ICD-10-CM | POA: Diagnosis not present

## 2015-08-03 HISTORY — DX: Unspecified glaucoma: H40.9

## 2015-08-03 HISTORY — DX: Nonrheumatic mitral (valve) insufficiency: I34.0

## 2015-08-03 HISTORY — DX: Nonrheumatic aortic (valve) insufficiency: I35.1

## 2015-08-03 LAB — URINALYSIS, ROUTINE W REFLEX MICROSCOPIC
BILIRUBIN URINE: NEGATIVE
Glucose, UA: NEGATIVE mg/dL
Hgb urine dipstick: NEGATIVE
Ketones, ur: NEGATIVE mg/dL
NITRITE: NEGATIVE
PH: 7.5 (ref 5.0–8.0)
Protein, ur: NEGATIVE mg/dL
SPECIFIC GRAVITY, URINE: 1.009 (ref 1.005–1.030)
Urobilinogen, UA: 0.2 mg/dL (ref 0.0–1.0)

## 2015-08-03 LAB — MAGNESIUM: Magnesium: 2 mg/dL (ref 1.7–2.4)

## 2015-08-03 LAB — CBC WITH DIFFERENTIAL/PLATELET
BASOS ABS: 0 10*3/uL (ref 0.0–0.1)
Basophils Relative: 1 %
Eosinophils Absolute: 0.2 10*3/uL (ref 0.0–0.7)
Eosinophils Relative: 2 %
HEMATOCRIT: 42.3 % (ref 36.0–46.0)
HEMOGLOBIN: 13.8 g/dL (ref 12.0–15.0)
Lymphocytes Relative: 23 %
Lymphs Abs: 2 10*3/uL (ref 0.7–4.0)
MCH: 30.9 pg (ref 26.0–34.0)
MCHC: 32.6 g/dL (ref 30.0–36.0)
MCV: 94.6 fL (ref 78.0–100.0)
MONO ABS: 0.9 10*3/uL (ref 0.1–1.0)
Monocytes Relative: 10 %
NEUTROS ABS: 5.7 10*3/uL (ref 1.7–7.7)
Neutrophils Relative %: 64 %
Platelets: 243 10*3/uL (ref 150–400)
RBC: 4.47 MIL/uL (ref 3.87–5.11)
RDW: 13.3 % (ref 11.5–15.5)
WBC: 8.8 10*3/uL (ref 4.0–10.5)

## 2015-08-03 LAB — D-DIMER, QUANTITATIVE: D-Dimer, Quant: 0.86 ug/mL-FEU — ABNORMAL HIGH (ref 0.00–0.48)

## 2015-08-03 LAB — TSH: TSH: 2.359 u[IU]/mL (ref 0.350–4.500)

## 2015-08-03 LAB — BASIC METABOLIC PANEL
ANION GAP: 7 (ref 5–15)
BUN: 15 mg/dL (ref 6–20)
CO2: 30 mmol/L (ref 22–32)
Calcium: 9.6 mg/dL (ref 8.9–10.3)
Chloride: 101 mmol/L (ref 101–111)
Creatinine, Ser: 0.7 mg/dL (ref 0.44–1.00)
GFR calc Af Amer: >60 mL/min (ref >60)
GLUCOSE: 97 mg/dL (ref 65–99)
POTASSIUM: 4.3 mmol/L (ref 3.5–5.1)
Sodium: 138 mmol/L (ref 135–145)

## 2015-08-03 LAB — URINE MICROSCOPIC-ADD ON

## 2015-08-03 LAB — TROPONIN I

## 2015-08-03 MED ORDER — SODIUM CHLORIDE 0.9 % IV SOLN
INTRAVENOUS | Status: DC
  Administered 2015-08-03 – 2015-08-04 (×2): via INTRAVENOUS

## 2015-08-03 MED ORDER — DONEPEZIL HCL 10 MG PO TABS
10.0000 mg | ORAL_TABLET | Freq: Every day | ORAL | Status: DC
  Administered 2015-08-03 – 2015-08-04 (×2): 10 mg via ORAL
  Filled 2015-08-03 (×2): qty 1

## 2015-08-03 MED ORDER — TIMOLOL HEMIHYDRATE 0.25 % OP SOLN: 1.0000 [drp] | Freq: Two times a day (BID) | OPHTHALMIC | Status: DC

## 2015-08-03 MED ORDER — OMEGA-3-ACID ETHYL ESTERS 1 G PO CAPS
1.0000 g | ORAL_CAPSULE | Freq: Every day | ORAL | Status: DC
  Administered 2015-08-03 – 2015-08-04 (×2): 1 g via ORAL
  Filled 2015-08-03 (×2): qty 1

## 2015-08-03 MED ORDER — SODIUM CHLORIDE 0.9 % IJ SOLN
3.0000 mL | Freq: Two times a day (BID) | INTRAMUSCULAR | Status: DC
  Administered 2015-08-03 – 2015-08-04 (×3): 3 mL via INTRAVENOUS

## 2015-08-03 MED ORDER — ACETAMINOPHEN 325 MG PO TABS: 650.0000 mg | ORAL_TABLET | Freq: Four times a day (QID) | ORAL | Status: DC | PRN

## 2015-08-03 MED ORDER — IOHEXOL 350 MG/ML SOLN
100.0000 mL | Freq: Once | INTRAVENOUS | Status: AC | PRN
  Administered 2015-08-03: 100 mL via INTRAVENOUS

## 2015-08-03 MED ORDER — SORBITOL 70 % SOLN
30.0000 mL | Freq: Every day | Status: DC | PRN
  Filled 2015-08-03: qty 30

## 2015-08-03 MED ORDER — ALBUTEROL SULFATE (2.5 MG/3ML) 0.083% IN NEBU: 2.5000 mg | INHALATION_SOLUTION | RESPIRATORY_TRACT | Status: DC | PRN

## 2015-08-03 MED ORDER — ASPIRIN EC 81 MG PO TBEC
81.0000 mg | DELAYED_RELEASE_TABLET | Freq: Every day | ORAL | Status: DC
  Administered 2015-08-03 – 2015-08-04 (×2): 81 mg via ORAL
  Filled 2015-08-03 (×2): qty 1

## 2015-08-03 MED ORDER — TIMOLOL MALEATE 0.25 % OP SOLN
1.0000 [drp] | Freq: Two times a day (BID) | OPHTHALMIC | Status: DC
  Filled 2015-08-03: qty 5

## 2015-08-03 MED ORDER — ACETAMINOPHEN 650 MG RE SUPP: 650.0000 mg | Freq: Four times a day (QID) | RECTAL | Status: DC | PRN

## 2015-08-03 MED ORDER — ENOXAPARIN SODIUM 40 MG/0.4ML ~~LOC~~ SOLN
40.0000 mg | SUBCUTANEOUS | Status: DC
  Administered 2015-08-03: 40 mg via SUBCUTANEOUS
  Filled 2015-08-03 (×2): qty 0.4

## 2015-08-03 MED ORDER — ONDANSETRON HCL 4 MG PO TABS: 4.0000 mg | ORAL_TABLET | Freq: Four times a day (QID) | ORAL | Status: DC | PRN

## 2015-08-03 MED ORDER — ATORVASTATIN CALCIUM 20 MG PO TABS
20.0000 mg | ORAL_TABLET | Freq: Every day | ORAL | Status: DC
Start: 2015-08-03 — End: 2015-08-04
  Administered 2015-08-03 – 2015-08-04 (×2): 20 mg via ORAL
  Filled 2015-08-03 (×2): qty 1

## 2015-08-03 MED ORDER — ASPIRIN 81 MG PO TABS: 81.0000 mg | ORAL_TABLET | Freq: Every day | ORAL | Status: DC

## 2015-08-03 MED ORDER — SENNOSIDES-DOCUSATE SODIUM 8.6-50 MG PO TABS: 1.0000 | ORAL_TABLET | Freq: Every evening | ORAL | Status: DC | PRN

## 2015-08-03 MED ORDER — CITALOPRAM HYDROBROMIDE 20 MG PO TABS
20.0000 mg | ORAL_TABLET | Freq: Every day | ORAL | Status: DC
  Administered 2015-08-03 – 2015-08-04 (×2): 20 mg via ORAL
  Filled 2015-08-03 (×2): qty 1

## 2015-08-03 MED ORDER — ONDANSETRON HCL 4 MG/2ML IJ SOLN: 4.0000 mg | Freq: Four times a day (QID) | INTRAMUSCULAR | Status: DC | PRN

## 2015-08-03 NOTE — Consult Note (Signed)
Referring Physician: Dr. Quillian Quince Thompson/Dr. Little/Dr. Cari Caraway  Gabrielle [name] is an 81 y.o. female.                       Chief Complaint: Syncope  HPI: 81 y.o. female with history of hyperlipidemia, depression, arthritis, prior history of syncope per daughter felt to be vasovagal in nature presented to the ED with a syncopal episode. Per daughter patient had taken a hot shower and when she came out of the tub patient subsequently dropped to the floor. Patient's daughter was in the next room, heard the crash and came to the bathroom. Per daughter patient was unconscious for about 3-4 minutes. Patient does state that she did feel lightheaded. Patient's daughter noticed that patient when she came around was somewhat disoriented and has some global weakness. Patient denies any fevers, no chills, no nausea, no vomiting, no chest pain, no shortness of breath, no abdominal pain, no diarrhea, no constipation, no dysuria, no melanoma, no hematemesis, no hematochezia, no cough. Patient denies any tongue biting. No bowel or bladder incontinence. Blood pressure was low at home. She is on Eye drops with B-blocker x 2 months.  EKG showed SR with 1st degree AV block. CT head and Chest x-ray were non-contributory.  Past Medical History  Diagnosis Date  . Arthritis     oa  . Hyperlipidemia       Past Surgical History  Procedure Laterality Date  . Tonsillectomy  age 64  . Wrist fracture surgery Right   . Benign breast tumor removed  age 41  . Abdominal hysterectomy    . Total knee arthroplasty Right 11/21/2013    Procedure: RIGHT TOTAL KNEE ARTHROPLASTY;  Surgeon: Gearlean Alf, MD;  Location: [facility];  Service: Orthopedics;  Laterality: Right;    History reviewed. No pertinent family history. Social History:  reports that she quit smoking about 49 years ago. Her smoking use included Cigarettes. She has a 17 pack-year smoking history. She has never used smokeless tobacco. She reports that  she drinks alcohol. She reports that she does not use illicit drugs.  Allergies: No Known Allergies  Medications Prior to Admission  Medication Sig Dispense Refill  . aspirin 81 MG tablet Take 81 mg by mouth daily.     Marland Kitchen atorvastatin (LIPITOR) 20 MG tablet Take 20 mg by mouth daily.    . Calcium Carb-Cholecalciferol (CALCIUM 600 + D PO) Take 1 tablet by mouth 2 (two) times daily.    . Cholecalciferol (VITAMIN D) 2000 UNITS CAPS Take 4,000 Units by mouth every evening.     . citalopram (CELEXA) 20 MG tablet Take 20 mg by mouth daily.     Marland Kitchen donepezil (ARICEPT) 10 MG tablet Take 10 mg by mouth daily.    . Multiple Vitamins-Minerals (ICAPS MV) TABS Take 1 tablet by mouth daily.     . Omega-3 Fatty Acids (FISH OIL) 1000 MG CAPS Take 1,000 mg by mouth every evening.    . timolol (BETIMOL) 0.25 % ophthalmic solution Place 1 drop into both eyes 2 (two) times daily.       Results for orders placed or performed during the hospital encounter of 08/03/15 (from the past 48 hour(s))  Basic metabolic panel     Status: None   Collection Time: 08/03/15 10:50 AM  Result Value Ref Range   Sodium 138 135 - 145 mmol/L   Potassium 4.3 3.5 - 5.1 mmol/L   Chloride 101 101 - 111  mmol/L   CO2 30 22 - 32 mmol/L   Glucose, Bld 97 65 - 99 mg/dL   BUN 15 6 - 20 mg/dL   Creatinine, Ser 2.41 0.44 - 1.00 mg/dL   Calcium 9.6 8.9 - 47.5 mg/dL   GFR calc non Af Amer >60 >60 mL/min   GFR calc Af Amer >60 >60 mL/min    Comment: (NOTE) The eGFR has been calculated using the CKD EPI equation. This calculation has not been validated in all clinical situations. eGFR's persistently <60 mL/min signify possible Chronic Kidney Disease.    Anion gap 7 5 - 15  CBC with Differential     Status: None   Collection Time: 08/03/15 10:50 AM  Result Value Ref Range   WBC 8.8 4.0 - 10.5 K/uL   RBC 4.47 3.87 - 5.11 MIL/uL   Hemoglobin 13.8 12.0 - 15.0 g/dL   HCT 36.1 44.1 - 80.6 %   MCV 94.6 78.0 - 100.0 fL   MCH 30.9 26.0 -  34.0 pg   MCHC 32.6 30.0 - 36.0 g/dL   RDW 07.1 49.5 - 03.6 %   Platelets 243 150 - 400 K/uL   Neutrophils Relative % 64 %   Neutro Abs 5.7 1.7 - 7.7 K/uL   Lymphocytes Relative 23 %   Lymphs Abs 2.0 0.7 - 4.0 K/uL   Monocytes Relative 10 %   Monocytes Absolute 0.9 0.1 - 1.0 K/uL   Eosinophils Relative 2 %   Eosinophils Absolute 0.2 0.0 - 0.7 K/uL   Basophils Relative 1 %   Basophils Absolute 0.0 0.0 - 0.1 K/uL  D-dimer, quantitative (not at [facility])     Status: Abnormal   Collection Time: 08/03/15  3:30 PM  Result Value Ref Range   D-Dimer, Quant 0.86 (H) 0.00 - 0.48 ug/mL-FEU    Comment:        AT THE INHOUSE ESTABLISHED CUTOFF VALUE OF 0.48 ug/mL FEU, THIS ASSAY HAS BEEN DOCUMENTED IN THE LITERATURE TO HAVE A SENSITIVITY AND NEGATIVE PREDICTIVE VALUE OF AT LEAST 98 TO 99%.  THE TEST RESULT SHOULD BE CORRELATED WITH AN ASSESSMENT OF THE CLINICAL PROBABILITY OF DVT / VTE.   Urinalysis, Routine w reflex microscopic     Status: Abnormal   Collection Time: 08/03/15  3:40 PM  Result Value Ref Range   Color, Urine YELLOW YELLOW   APPearance CLEAR CLEAR   Specific Gravity, Urine 1.009 1.005 - 1.030   pH 7.5 5.0 - 8.0   Glucose, UA NEGATIVE NEGATIVE mg/dL   Hgb urine dipstick NEGATIVE NEGATIVE   Bilirubin Urine NEGATIVE NEGATIVE   Ketones, ur NEGATIVE NEGATIVE mg/dL   Protein, ur NEGATIVE NEGATIVE mg/dL   Urobilinogen, UA 0.2 0.0 - 1.0 mg/dL   Nitrite NEGATIVE NEGATIVE   Leukocytes, UA TRACE (A) NEGATIVE  Urine microscopic-add on     Status: None   Collection Time: 08/03/15  3:40 PM  Result Value Ref Range   WBC, UA 0-2 <3 WBC/hpf  Magnesium     Status: None   Collection Time: 08/03/15  5:40 PM  Result Value Ref Range   Magnesium 2.0 1.7 - 2.4 mg/dL  TSH     Status: None   Collection Time: 08/03/15  5:40 PM  Result Value Ref Range   TSH 2.359 0.350 - 4.500 uIU/mL  Troponin I     Status: None   Collection Time: 08/03/15  5:40 PM  Result Value Ref Range   Troponin I  <0.03 <0.031 ng/mL  Comment:        NO INDICATION OF MYOCARDIAL INJURY.    Ct Head Wo Contrast  08/03/2015   CLINICAL DATA:  Syncope and fall this morning.  Initial encounter.  EXAM: CT HEAD WITHOUT CONTRAST  CT CERVICAL SPINE WITHOUT CONTRAST  TECHNIQUE: Multidetector CT imaging of the head and cervical spine was performed following the standard protocol without intravenous contrast. Multiplanar CT image reconstructions of the cervical spine were also generated.  COMPARISON:  Head and maxillofacial CT scans 07/05/2009  FINDINGS: CT HEAD FINDINGS  There is some cortical atrophy and chronic microvascular ischemic change. No acute intracranial abnormality including hemorrhage, infarct, mass lesion, mass effect, midline shift or abnormal extra-axial fluid collection is identified. No hydrocephalus or pneumocephalus. The calvarium is intact. Imaged paranasal sinuses and mastoid air cells are clear.  CT CERVICAL SPINE FINDINGS  No fracture is identified. There is reversal of the normal cervical lordosis. Loss of disc space height and endplate spurring are worst from C4-C6. Lung apices are clear.  IMPRESSION: No acute abnormality head or cervical spine.  Atrophy and chronic microvascular ischemic change.  Cervical spondylosis.   Electronically Signed   By: Inge Rise M.D.   On: 08/03/2015 11:41   Ct Cervical Spine Wo Contrast  08/03/2015   CLINICAL DATA:  Syncope and fall this morning.  Initial encounter.  EXAM: CT HEAD WITHOUT CONTRAST  CT CERVICAL SPINE WITHOUT CONTRAST  TECHNIQUE: Multidetector CT imaging of the head and cervical spine was performed following the standard protocol without intravenous contrast. Multiplanar CT image reconstructions of the cervical spine were also generated.  COMPARISON:  Head and maxillofacial CT scans 07/05/2009  FINDINGS: CT HEAD FINDINGS  There is some cortical atrophy and chronic microvascular ischemic change. No acute intracranial abnormality including hemorrhage,  infarct, mass lesion, mass effect, midline shift or abnormal extra-axial fluid collection is identified. No hydrocephalus or pneumocephalus. The calvarium is intact. Imaged paranasal sinuses and mastoid air cells are clear.  CT CERVICAL SPINE FINDINGS  No fracture is identified. There is reversal of the normal cervical lordosis. Loss of disc space height and endplate spurring are worst from C4-C6. Lung apices are clear.  IMPRESSION: No acute abnormality head or cervical spine.  Atrophy and chronic microvascular ischemic change.  Cervical spondylosis.   Electronically Signed   By: Inge Rise M.D.   On: 08/03/2015 11:41   Portable Chest 1 View  08/03/2015   CLINICAL DATA:  Syncope.  Shortness of breath after fall.  EXAM: PORTABLE CHEST 1 VIEW  COMPARISON:  12/17/2013  FINDINGS: Severe thoracolumbar scoliosis. There is hyperinflation of the lungs compatible with COPD. Heart is normal size. Lungs are clear. No effusions or pneumothorax. No acute bony abnormality.  IMPRESSION: COPD.  No active disease.   Electronically Signed   By: Rolm Baptise M.D.   On: 08/03/2015 16:06    Review Of Systems No weight gain or loss, Possible TIAs in past., Remote smoking with COPD, No chest pain or MI, No GI or GU bleed.  Blood pressure 132/48, pulse 56, temperature 97.7 F (36.5 C), temperature source Oral, resp. rate 18, height 5' 8.5" (1.74 m), weight 71.2 kg (156 lb 15.5 oz), SpO2 100 %. General: Well-developed averagly-nourished laying on hospital bed in no acute cardiopulmonary distress. Speaking in full sentences. Eyes: PERRLA, EOMI, normal lids, irises & conjunctiva ENT: grossly normal hearing, lips & tongue Neck: no JVD, masses or thyromegaly Cardiovascular: RRR, no m/r/g. Trace LE edema. Telemetry: Sinus bradycardia Respiratory: CTA bilaterally, no  w/r/r. Normal respiratory effort. Abdomen: soft, ntnd, positive bowel sounds. Skin: no rash or induration seen on limited exam Musculoskeletal: grossly  normal tone BUE/BLE Psychiatric: grossly normal mood and affect, speech fluent and appropriate Neurologic: Alert and on to 3. CN 2 through 12 are grossly intact. No focal deficits. Sensation is intact. Gait not tested secondary to safety.  Assessment/Plan Syncope Hypertension Arthritis Glaucoma Depression Vasovagal syncope, r/o Aortic stenosis R/O tachy-brady syndrome. Possible adverse effect of eye drops.  Agree with monitoring, labs, echocardiogram and CT scans. Consider MRI of brain if needed. Must use walker all the time until etiology is clear.  Birdie Riddle, MD  08/03/2015, 9:04 PM

## 2015-08-03 NOTE — ED Notes (Signed)
Off floor for testing 

## 2015-08-03 NOTE — ED Notes (Signed)
MD at bedside. 

## 2015-08-03 NOTE — ED Notes (Signed)
Patient transported to CT 

## 2015-08-03 NOTE — ED Notes (Signed)
Bed: WA15 Expected date:  Expected time:  Means of arrival:  Comments: EMS-fall 

## 2015-08-03 NOTE — ED Notes (Signed)
Per EMS-states patient had syncopal episode in shower this am-states shower was hot, did not hit head-fell out of shower-daughter was present-hypotensive when EMS arrived-HR 50, which is normal for patient-no complaints of pain-BP 106/76

## 2015-08-03 NOTE — ED Notes (Signed)
Pt voided but missed the hat.

## 2015-08-03 NOTE — H&P (Signed)
[facility] History and Physical  Gabrielle [name] HQI:[id number] DOB: 08-20-1927 DOA: 08/03/2015  Referring physician: Clarene Duke, NP PCP: Gweneth Dimitri, MD   Chief Complaint: passed out  HPI: Gabrielle [name] is a 81 y.o. female  With history of hyperlipidemia, depression, arthritis, prior history of syncope per daughter felt to be vasovagal in nature presented to the ED with a syncopal episode. Per daughter patient had taken a hot shower and when she came out of the tub patient subsequently dropped to the floor. Patient's daughter was in the next room had the crash and came to the bathroom. Per daughter was unconscious for about 3-4 minutes. Patient does state that she did feel lightheaded. Patient's daughter noticed that patient when she came around was somewhat disoriented but doesn't have cleared up with some global weakness. Patient denies any fevers, no chills, no nausea, no vomiting, no chest pain, no shortness of breath, no abdominal pain, no diarrhea, no constipation, no dysuria, no melanoma, no hematemesis, no hematochezia, no cough. Patient denies any tongue biting. No bowel or bladder incontinence.  This is daughter stated that patient was unable to sit up and laid back down. EMS was subsequently called. Per daughter when EMS got there her pulse was noted to be atretic and patient was noted to have low blood pressure. Patient was subsequently brought to the emergency room. Basic metabolic profile done was unremarkable. CBC done was unremarkable. CT head was negative for any acute abnormalities. Urinalysis was not done. Chest x-ray was not done. EKG showed normal sinus rhythm with first-degree AV block. Orthostatics were checked and patient was noted to be mildly orthostatic with systolic blood pressure going from 139/55 with a pulse of 53 lying to sitting 150/62 with a pulse of 55, to standing with a blood pressure of 136/61 with a pulse of 58.  tried hospice were called to  admit the patient for further evaluation and management.   Review of Systems: As per history of present illness otherwise negative. Constitutional:  No weight loss, night sweats, Fevers, chills, fatigue.  HEENT:  No headaches, Difficulty swallowing,Tooth/dental problems,Sore throat,  No sneezing, itching, ear ache, nasal congestion, post nasal drip,  Cardio-vascular:  No chest pain, Orthopnea, PND, swelling in lower extremities, anasarca, dizziness, palpitations  GI:  No heartburn, indigestion, abdominal pain, nausea, vomiting, diarrhea, change in bowel habits, loss of appetite  Resp:  No shortness of breath with exertion or at rest. No excess mucus, no productive cough, No non-productive cough, No coughing up of blood.No change in color of mucus.No wheezing.No chest wall deformity  Skin:  no rash or lesions.  GU:  no dysuria, change in color of urine, no urgency or frequency. No flank pain.  Musculoskeletal:  No joint pain or swelling. No decreased range of motion. No back pain.  Psych:  No change in mood or affect. No depression or anxiety. No memory loss.   Past Medical History  Diagnosis Date  . Arthritis     oa  . Hyperlipidemia    Past Surgical History  Procedure Laterality Date  . Tonsillectomy  age 39  . Wrist fracture surgery Right   . Benign breast tumor removed  age 60  . Abdominal hysterectomy    . Total knee arthroplasty Right 11/21/2013    Procedure: RIGHT TOTAL KNEE ARTHROPLASTY;  Surgeon: Loanne Drilling, MD;  Location: [facility];  Service: Orthopedics;  Laterality: Right;   Social History:  reports that she quit smoking about 49 years ago. Her smoking  use included Cigarettes. She has a 17 pack-year smoking history. She has never used smokeless tobacco. She reports that she drinks alcohol. She reports that she does not use illicit drugs.  No Known Allergies  History reviewed. No pertinent family history. mother deceased age 78 cause unknown. Father deceased age  53 from esophageal cancer.  Prior to Admission medications   Medication Sig Start Date End Date Taking? Authorizing Provider  aspirin 81 MG tablet Take 81 mg by mouth daily.    Yes Historical Provider, MD  atorvastatin (LIPITOR) 20 MG tablet Take 20 mg by mouth daily.   Yes Historical Provider, MD  Calcium Carb-Cholecalciferol (CALCIUM 600 + D PO) Take 1 tablet by mouth 2 (two) times daily.   Yes Historical Provider, MD  Cholecalciferol (VITAMIN D) 2000 UNITS CAPS Take 4,000 Units by mouth every evening.    Yes Historical Provider, MD  citalopram (CELEXA) 20 MG tablet Take 20 mg by mouth daily.    Yes Historical Provider, MD  donepezil (ARICEPT) 10 MG tablet Take 10 mg by mouth daily. 06/19/15  Yes Historical Provider, MD  Multiple Vitamins-Minerals (ICAPS MV) TABS Take 1 tablet by mouth daily.    Yes Historical Provider, MD  Omega-3 Fatty Acids (FISH OIL) 1000 MG CAPS Take 1,000 mg by mouth every evening.   Yes Historical Provider, MD  timolol (BETIMOL) 0.25 % ophthalmic solution Place 1-2 drops into both eyes 2 (two) times daily.   Yes Historical Provider, MD   Physical Exam: Filed Vitals:   08/03/15 0952 08/03/15 1158 08/03/15 1422 08/03/15 1500  BP: 117/44 136/81 132/48   Pulse: 52 53 56   Temp: 98.6 F (37 C)  97.7 F (36.5 C)   TempSrc:   Oral   Resp: Height:    5' 8.5" (1.74 m)  Weight:    71.2 kg (156 lb 15.5 oz)  SpO2: 92% 98% 100%     Wt Readings from Last 3 Encounters:  08/03/15 71.2 kg (156 lb 15.5 oz)  11/14/14 74.39 kg (164 lb)  12/17/13 73.936 kg (163 lb)    General:  Well-developed well-nourished laying on hospital bed in no acute cardiopulmonary distress. Speaking in full sentences. Eyes: PERRLA, EOMI, normal lids, irises & conjunctiva ENT: grossly normal hearing, lips & tongue Neck: no LAD, masses or thyromegaly Cardiovascular: RRR, no m/r/g. No LE edema. Telemetry: Sinus bradycardia Respiratory: CTA bilaterally, no w/r/r. Normal respiratory  effort. Abdomen: soft, ntnd, positive bowel sounds, no rebound, no guarding Skin: no rash or induration seen on limited exam Musculoskeletal: grossly normal tone BUE/BLE Psychiatric: grossly normal mood and affect, speech fluent and appropriate Neurologic: Alert and on to 3. CN 2 through 12 are grossly intact. No focal deficits. Sensation is intact. Gait not tested secondary to safety.           Labs on Admission:  Basic Metabolic Panel:  Recent Labs Lab 08/03/15 1050  NA 138  K 4.3  CL 101  CO2 30  GLUCOSE 97  BUN 15  CREATININE 0.70  CALCIUM 9.6   Liver Function Tests: No results for input(s): AST, ALT, ALKPHOS, BILITOT, PROT, ALBUMIN in the last 168 hours. No results for input(s): LIPASE, AMYLASE in the last 168 hours. No results for input(s): AMMONIA in the last 168 hours. CBC:  Recent Labs Lab 08/03/15 1050  WBC 8.8  NEUTROABS 5.7  HGB 13.8  HCT 42.3  MCV 94.6  PLT 243   Cardiac Enzymes: No results for input(s):  CKTOTAL, CKMB, CKMBINDEX, TROPONINI in the last 168 hours.  BNP (last 3 results) No results for input(s): BNP in the last 8760 hours.  ProBNP (last 3 results) No results for input(s): PROBNP in the last 8760 hours.  CBG: No results for input(s): GLUCAP in the last 168 hours.  Radiological Exams on Admission: Ct Head Wo Contrast  08/03/2015   CLINICAL DATA:  Syncope and fall this morning.  Initial encounter.  EXAM: CT HEAD WITHOUT CONTRAST  CT CERVICAL SPINE WITHOUT CONTRAST  TECHNIQUE: Multidetector CT imaging of the head and cervical spine was performed following the standard protocol without intravenous contrast. Multiplanar CT image reconstructions of the cervical spine were also generated.  COMPARISON:  Head and maxillofacial CT scans 07/05/2009  FINDINGS: CT HEAD FINDINGS  There is some cortical atrophy and chronic microvascular ischemic change. No acute intracranial abnormality including hemorrhage, infarct, mass lesion, mass effect, midline  shift or abnormal extra-axial fluid collection is identified. No hydrocephalus or pneumocephalus. The calvarium is intact. Imaged paranasal sinuses and mastoid air cells are clear.  CT CERVICAL SPINE FINDINGS  No fracture is identified. There is reversal of the normal cervical lordosis. Loss of disc space height and endplate spurring are worst from C4-C6. Lung apices are clear.  IMPRESSION: No acute abnormality head or cervical spine.  Atrophy and chronic microvascular ischemic change.  Cervical spondylosis.   Electronically Signed   By: Drusilla Kanner M.D.   On: 08/03/2015 11:41   Ct Cervical Spine Wo Contrast  08/03/2015   CLINICAL DATA:  Syncope and fall this morning.  Initial encounter.  EXAM: CT HEAD WITHOUT CONTRAST  CT CERVICAL SPINE WITHOUT CONTRAST  TECHNIQUE: Multidetector CT imaging of the head and cervical spine was performed following the standard protocol without intravenous contrast. Multiplanar CT image reconstructions of the cervical spine were also generated.  COMPARISON:  Head and maxillofacial CT scans 07/05/2009  FINDINGS: CT HEAD FINDINGS  There is some cortical atrophy and chronic microvascular ischemic change. No acute intracranial abnormality including hemorrhage, infarct, mass lesion, mass effect, midline shift or abnormal extra-axial fluid collection is identified. No hydrocephalus or pneumocephalus. The calvarium is intact. Imaged paranasal sinuses and mastoid air cells are clear.  CT CERVICAL SPINE FINDINGS  No fracture is identified. There is reversal of the normal cervical lordosis. Loss of disc space height and endplate spurring are worst from C4-C6. Lung apices are clear.  IMPRESSION: No acute abnormality head or cervical spine.  Atrophy and chronic microvascular ischemic change.  Cervical spondylosis.   Electronically Signed   By: Drusilla Kanner M.D.   On: 08/03/2015 11:41    EKG: Independently reviewed. Normal sinus rhythm with first-degree AV block. No ischemic  changes.  Assessment/Plan Principal Problem:   Syncope Active Problems:   OA (osteoarthritis) of knee   Constipation   Pure hypercholesterolemia   Depression  #1 syncope Questionable etiology. Differential includes hypotension as per family patient was noted to have low blood pressure when EMS got there however exact blood pressure was not documented versus vasovagal versus cardiogenic as patient was noted to have tready pulse. Doubt if neurological as patient with no neurological deficits. CT head was negative. Will admit the patient to telemetry. Cycle cardiac enzymes every 6 hours 3. Check a TSH. Check a d-dimer. Check a 2-D echo. Check carotid Dopplers. Hydrated with IV fluids. Repeat orthostasis in the morning. PT/OT. Also consulted cardiology for further evaluation and management.  #2 depression Continue home regimen of Celexa.  #3 hyperlipidemia Continue omega-3.  #  4 osteoarthritis  Stable.  #5 history of constipation Stable. Place on Senokot S daily.  #6 prophylaxis Lovenox for DVT prophylaxis.  Code Status: Full DVT Prophylaxis: Lovenox Family Communication: Updated daughter, granddaughter at bedside. Disposition Plan: Admit to telemetry under observation  Time spent: 65 mins  [facility] MD [facility] Pager [phone]

## 2015-08-03 NOTE — ED Notes (Signed)
Pt can go to floor at 14:00. 

## 2015-08-04 ENCOUNTER — Observation Stay (HOSPITAL_COMMUNITY): Payer: Medicare Other

## 2015-08-04 ENCOUNTER — Encounter (HOSPITAL_COMMUNITY): Payer: Self-pay | Admitting: Internal Medicine

## 2015-08-04 ENCOUNTER — Observation Stay (HOSPITAL_BASED_OUTPATIENT_CLINIC_OR_DEPARTMENT_OTHER): Payer: Medicare Other

## 2015-08-04 DIAGNOSIS — H40819 Glaucoma with increased episcleral venous pressure, unspecified eye: Secondary | ICD-10-CM | POA: Diagnosis not present

## 2015-08-04 DIAGNOSIS — R42 Dizziness and giddiness: Secondary | ICD-10-CM | POA: Diagnosis present

## 2015-08-04 DIAGNOSIS — R55 Syncope and collapse: Secondary | ICD-10-CM | POA: Diagnosis not present

## 2015-08-04 DIAGNOSIS — I951 Orthostatic hypotension: Secondary | ICD-10-CM | POA: Diagnosis not present

## 2015-08-04 DIAGNOSIS — I10 Essential (primary) hypertension: Secondary | ICD-10-CM | POA: Diagnosis not present

## 2015-08-04 DIAGNOSIS — K5900 Constipation, unspecified: Secondary | ICD-10-CM | POA: Diagnosis not present

## 2015-08-04 DIAGNOSIS — H409 Unspecified glaucoma: Secondary | ICD-10-CM

## 2015-08-04 DIAGNOSIS — I340 Nonrheumatic mitral (valve) insufficiency: Secondary | ICD-10-CM

## 2015-08-04 DIAGNOSIS — I351 Nonrheumatic aortic (valve) insufficiency: Secondary | ICD-10-CM | POA: Diagnosis present

## 2015-08-04 DIAGNOSIS — F328 Other depressive episodes: Secondary | ICD-10-CM | POA: Diagnosis not present

## 2015-08-04 DIAGNOSIS — F329 Major depressive disorder, single episode, unspecified: Secondary | ICD-10-CM | POA: Diagnosis not present

## 2015-08-04 HISTORY — DX: Unspecified glaucoma: H40.9

## 2015-08-04 HISTORY — DX: Nonrheumatic aortic (valve) insufficiency: I35.1

## 2015-08-04 HISTORY — DX: Nonrheumatic mitral (valve) insufficiency: I34.0

## 2015-08-04 LAB — BASIC METABOLIC PANEL
ANION GAP: 10 (ref 5–15)
BUN: 11 mg/dL (ref 6–20)
CO2: 24 mmol/L (ref 22–32)
Calcium: 8.9 mg/dL (ref 8.9–10.3)
Chloride: 106 mmol/L (ref 101–111)
Creatinine, Ser: 0.58 mg/dL (ref 0.44–1.00)
GFR calc Af Amer: >60 mL/min (ref >60)
Glucose, Bld: 87 mg/dL (ref 65–99)
POTASSIUM: 4.1 mmol/L (ref 3.5–5.1)
SODIUM: 140 mmol/L (ref 135–145)

## 2015-08-04 LAB — GLUCOSE, CAPILLARY: GLUCOSE-CAPILLARY: 87 mg/dL (ref 65–99)

## 2015-08-04 LAB — CBC
HEMATOCRIT: 41.3 % (ref 36.0–46.0)
HEMOGLOBIN: 13.7 g/dL (ref 12.0–15.0)
MCH: 31.1 pg (ref 26.0–34.0)
MCHC: 33.2 g/dL (ref 30.0–36.0)
MCV: 93.7 fL (ref 78.0–100.0)
Platelets: 176 10*3/uL (ref 150–400)
RBC: 4.41 MIL/uL (ref 3.87–5.11)
RDW: 13.3 % (ref 11.5–15.5)
WBC: 8.2 10*3/uL (ref 4.0–10.5)

## 2015-08-04 LAB — TROPONIN I

## 2015-08-04 MED ORDER — SENNOSIDES-DOCUSATE SODIUM 8.6-50 MG PO TABS: 1.0000 | ORAL_TABLET | Freq: Every day | ORAL | Status: DC

## 2015-08-04 NOTE — Discharge Summary (Signed)
Physician Discharge Summary  Gabrielle [name] ZOX:[id number] DOB: [birth year] DOA: 08/03/2015  PCP: Gweneth Dimitri, MD  Admit date: 08/03/2015 Discharge date: 08/04/2015  Time spent: 65 minutes  Recommendations for Outpatient Follow-up:  1. Follow-up with MCNEILL,[name], MD in 1-2 weeks. On follow-up patient will need a basic metabolic profile done to follow-up on electrolytes and renal function. 2. Follow-up with Dr. Algie Coffer, cardiology in approximately 10 days. 3. Follow-up with ophthalmologist as outpatient to see if timolol eyedrops can be changed to another class of medications for glaucoma.  Discharge Diagnoses:  Principal Problem:   Syncope Active Problems:   OA (osteoarthritis) of knee   Constipation   Pure hypercholesterolemia   Depression   Mild aortic insufficiency: Per 2 d echo 08/04/2015   Moderate mitral regurgitation: Per 2 d echo 08/04/2015   Glaucoma   Orthostasis: Mild   Discharge Condition: Stable and improved  Diet recommendation: Heart healthy/low fiber  Filed Weights   08/03/15 1500 08/04/15 0515  Weight: 71.2 kg (156 lb 15.5 oz) 69.945 kg (154 lb 3.2 oz)    History of present illness:  Gabrielle [name] is a 81 y.o. female  With history of hyperlipidemia, depression, arthritis, prior history of syncope per daughter felt to be vasovagal in nature presented to the ED with a syncopal episode. Per daughter patient had taken a hot shower and when she came out of the tub patient subsequently dropped to the floor. Patient's daughter was in the next room and heard the crash and came to the bathroom. Per daughter patient was unconscious for about 3-4 minutes. Patient does state that she did feel lightheaded. Patient's daughter noticed that patient when she came around was somewhat disoriented but cleared up with some global weakness. Patient denied any fevers, no chills, no nausea, no vomiting, no chest pain, no shortness of breath, no abdominal pain, no  diarrhea, no constipation, no dysuria, no melanoma, no hematemesis, no hematochezia, no cough. Patient denied any tongue biting. No bowel or bladder incontinence.  Patient's daughter stated that patient was unable to sit up and laid back down. EMS was subsequently called. Per daughter when EMS got there her pulse was noted to be tready and patient was noted to have low blood pressure. Patient was subsequently brought to the emergency room. Basic metabolic profile done was unremarkable. CBC done was unremarkable. CT head was negative for any acute abnormalities. Urinalysis was not done. Chest x-ray was not done. EKG showed normal sinus rhythm with first-degree AV block. Orthostatics were checked and patient was noted to be mildly orthostatic with systolic blood pressure going from 139/55 with a pulse of 53 lying to sitting 150/62 with a pulse of 55, to standing with a blood pressure of 136/61 with a pulse of 58. [facility] were called to admit the patient for further evaluation and management  Hospital Course:  #1 syncope  Likely secondary to adverse effects of eyedrops with beta blocker (timolol) in addition to possibly vasovagal with some mild orthostasis. Patient was admitted placed on telemetry and monitored. 2-D echo was obtained which showed a normal EF of 60-65% with no wall motion abnormalities. Also noted was moderate mitral regurgitation and mild aortic insufficiency. Carotid Dopplers which were done had no significant ICA stenosis preliminary readings. Patient was shown to be mildly orthostatic on admission. Patient was hydrated with IV fluids and mild orthostasis had resolved by day of discharge. CT head which was obtained was negative for any acute abnormalities. MRI of the head which was obtained  was also negative for any acute abnormalities. EKG showed normal sinus rhythm with first-degree AV block. D-dimer was obtained which came back elevated and a such CT angiogram of the chest was  done which was negative for pulmonary emboli. Cardiology consultation was obtained and patient was seen in consultation by Dr. Jennelle Human Who followed the patient throughout the hospitalization. It was recommended that patient ask her ophthalmologist to see if eyedrops could be changed from a beta blocker. Was recommended that patient use a walker until she feels strong enough to change over to a cane. Patient did not have any further syncopal episodes throughout the hospitalization and patient be discharged in stable and improved condition. Patient will follow-up with PCP and cardiology as outpatient.  #2 mild aortic insufficiency/moderate mitral regurgitation Per 2-D echo. Patient was seen by cardiology during the hospitalization and will follow-up as outpatient.  #3 depression Remained stable. Patient was continued on a home regimen of Celexa.  #4 mild orthostasis On admission patient was noted to be mildly orthostatic. Patient was hydrated with IV fluids with resolution.  #5 hyperlipidemia Patient was continued on a home regimen of omega-3.  #6 history of constipation Patient was maintained on Senokot-S daily.  #7 osteoarthritis Remained stable.  Procedures:  CT head 08/03/2015  MRI head 08/04/2015  2-D echo 08/04/2015  Carotid Dopplers 08/04/2015  Chest x-ray 08/03/2015  CT angiogram chest 08/03/2015.  Consultations:  Cardiology: Dr. Algie Coffer 08/03/2015  Discharge Exam: Filed Vitals:   08/04/15 1403  BP: 146/57  Pulse: 60  Temp: 98 F (36.7 C)  Resp: 18    General: NAD Cardiovascular: RRR Respiratory: CTAB  Discharge Instructions   Discharge Instructions    Diet - low sodium heart healthy    Complete by:  As directed   Low fiber diet     Discharge instructions    Complete by:  As directed   Use walker at all times, and as strength improves may change to cane. Follow up with Dr Algie Coffer, Cardiology in 10 days. Follow up with MCNEILL,[name], MD in 1-2 weeks.      Increase activity slowly    Complete by:  As directed           Current Discharge Medication List    START taking these medications   Details  senna-docusate (SENOKOT-S) 8.6-50 MG per tablet Take 1 tablet by mouth at bedtime.      CONTINUE these medications which have NOT CHANGED   Details  aspirin 81 MG tablet Take 81 mg by mouth daily.     atorvastatin (LIPITOR) 20 MG tablet Take 20 mg by mouth daily.    Calcium Carb-Cholecalciferol (CALCIUM 600 + D PO) Take 1 tablet by mouth 2 (two) times daily.    Cholecalciferol (VITAMIN D) 2000 UNITS CAPS Take 4,000 Units by mouth every evening.     citalopram (CELEXA) 20 MG tablet Take 20 mg by mouth daily.     donepezil (ARICEPT) 10 MG tablet Take 10 mg by mouth daily.    Multiple Vitamins-Minerals (ICAPS MV) TABS Take 1 tablet by mouth daily.     Omega-3 Fatty Acids (FISH OIL) 1000 MG CAPS Take 1,000 mg by mouth every evening.    timolol (BETIMOL) 0.25 % ophthalmic solution Place 1 drop into both eyes 2 (two) times daily.        No Known Allergies Follow-up Information    Schedule an appointment as soon as possible for a visit with [facility], MD.   Specialty:  Family Medicine  Why:  f/u in 1-2 weeks   Contact information:   [address] [place] [address] [phone]       Follow up with [facility], MD. Schedule an appointment as soon as possible for a visit in 10 days.   Specialty:  Cardiology   Contact information:   [address] [place] [address] [phone]        The results of significant diagnostics from this hospitalization (including imaging, microbiology, ancillary and laboratory) are listed below for reference.    Significant Diagnostic Studies: Ct Head Wo Contrast  08/03/2015   CLINICAL DATA:  Syncope and fall this morning.  Initial encounter.  EXAM: CT HEAD WITHOUT CONTRAST  CT CERVICAL SPINE WITHOUT CONTRAST  TECHNIQUE: Multidetector CT imaging of the head and  cervical spine was performed following the standard protocol without intravenous contrast. Multiplanar CT image reconstructions of the cervical spine were also generated.  COMPARISON:  Head and maxillofacial CT scans 07/05/2009  FINDINGS: CT HEAD FINDINGS  There is some cortical atrophy and chronic microvascular ischemic change. No acute intracranial abnormality including hemorrhage, infarct, mass lesion, mass effect, midline shift or abnormal extra-axial fluid collection is identified. No hydrocephalus or pneumocephalus. The calvarium is intact. Imaged paranasal sinuses and mastoid air cells are clear.  CT CERVICAL SPINE FINDINGS  No fracture is identified. There is reversal of the normal cervical lordosis. Loss of disc space height and endplate spurring are worst from C4-C6. Lung apices are clear.  IMPRESSION: No acute abnormality head or cervical spine.  Atrophy and chronic microvascular ischemic change.  Cervical spondylosis.   Electronically Signed   By: Drusilla Kanner M.D.   On: 08/03/2015 11:41   Ct Angio Chest Pe W/cm &/or Wo Cm  08/04/2015   CLINICAL DATA:  81 yo female with history of hyperlipidemia, depression, arthritis, prior history of syncope per daughter felt to be vasovagal in nature presented to the ED with a syncopal episode. Pt is now inpatient.  EXAM: CT ANGIOGRAPHY CHEST WITH CONTRAST  TECHNIQUE: Multidetector CT imaging of the chest was performed using the standard protocol during bolus administration of intravenous contrast. Multiplanar CT image reconstructions and MIPs were obtained to evaluate the vascular anatomy.  CONTRAST:  OMNIPAQUE IOHEXOL 350 MG/ML SOLN  COMPARISON:  None.  FINDINGS: Technically adequate study with good opacification of the central and segmental pulmonary arteries. No focal filling defects are demonstrated. No evidence of significant pulmonary embolus.  Normal heart size. Tortuous and ectatic thoracic aorta. Ascending thoracic aorta measures up to 0 3.8 cm  diameter. Calcification of the aorta. Calcification of coronary arteries. Great vessel origins are patent. Esophagus is decompressed. No significant lymphadenopathy in the chest.  Slight scarring in the lung apices. Left lingula demonstrates focal consolidation with nodular peribronchial infiltrates suggesting bronchopneumonia. Mild focal bronchiectasis. Small focal area of nodular infiltration in the superior segment of the right lower lobe may also represent bronchopneumonia. Scattered emphysematous changes in the lungs. No pleural effusions. No pneumothorax.  Included portions of the upper abdominal organs demonstrate multiple circumscribed low-attenuation lesions in the liver, likely to represent cysts or hemangiomas. Thoracic scoliosis convexed to the right. Degenerative changes in the spine. No destructive bone lesions  Review of the MIP images confirms the above findings.  IMPRESSION: No evidence of significant pulmonary embolus. Patchy nodular infiltrative changes in the lungs with focal small consolidation in the left lingula probably representing bronchopneumonia. Tortuous and dilated thoracic aorta with ascending aorta measuring 3.8 cm diameter.  Electronically Signed   By: Burman Nieves M.D.   On: 08/04/2015 00:00   Ct Cervical Spine Wo Contrast  08/03/2015   CLINICAL DATA:  Syncope and fall this morning.  Initial encounter.  EXAM: CT HEAD WITHOUT CONTRAST  CT CERVICAL SPINE WITHOUT CONTRAST  TECHNIQUE: Multidetector CT imaging of the head and cervical spine was performed following the standard protocol without intravenous contrast. Multiplanar CT image reconstructions of the cervical spine were also generated.  COMPARISON:  Head and maxillofacial CT scans 07/05/2009  FINDINGS: CT HEAD FINDINGS  There is some cortical atrophy and chronic microvascular ischemic change. No acute intracranial abnormality including hemorrhage, infarct, mass lesion, mass effect, midline shift or abnormal extra-axial  fluid collection is identified. No hydrocephalus or pneumocephalus. The calvarium is intact. Imaged paranasal sinuses and mastoid air cells are clear.  CT CERVICAL SPINE FINDINGS  No fracture is identified. There is reversal of the normal cervical lordosis. Loss of disc space height and endplate spurring are worst from C4-C6. Lung apices are clear.  IMPRESSION: No acute abnormality head or cervical spine.  Atrophy and chronic microvascular ischemic change.  Cervical spondylosis.   Electronically Signed   By: Drusilla Kanner M.D.   On: 08/03/2015 11:41   Mr Brain Wo Contrast  08/04/2015   CLINICAL DATA:  81 year old female with syncopal episode in the shower. Hypotension. Initial encounter.  EXAM: MRI HEAD WITHOUT CONTRAST  TECHNIQUE: Multiplanar, multiecho pulse sequences of the brain and surrounding structures were obtained without intravenous contrast.  COMPARISON:  Head CT and cervical spine CT 08/03/2015. Brain MRI 02/12/2015.  FINDINGS: Major intracranial vascular flow voids are stable. Pneumatized anterior clinoid processes incidentally noted. Stable cerebral volume. No restricted diffusion to suggest acute infarction. No midline shift, mass effect, evidence of mass lesion, ventriculomegaly, extra-axial collection or acute intracranial hemorrhage. Cervicomedullary junction and pituitary are within normal limits. Stable gray and white matter signal throughout the brain. No cortical encephalomalacia or definite chronic cerebral blood products. Deep gray matter nuclei brainstem and cerebellum remain within normal limits for age.  Visible internal auditory structures appear normal. Mastoids and paranasal sinuses are clear. Orbit and scalp soft tissues are stable. Negative visualized cervical spine. Normal bone marrow signal.  IMPRESSION: No acute intracranial abnormality. Stable noncontrast MRI appearance of the brain since April.   Electronically Signed   By: Odessa Fleming M.D.   On: 08/04/2015 10:42   Portable  Chest 1 View  08/03/2015   CLINICAL DATA:  Syncope.  Shortness of breath after fall.  EXAM: PORTABLE CHEST 1 VIEW  COMPARISON:  12/17/2013  FINDINGS: Severe thoracolumbar scoliosis. There is hyperinflation of the lungs compatible with COPD. Heart is normal size. Lungs are clear. No effusions or pneumothorax. No acute bony abnormality.  IMPRESSION: COPD.  No active disease.   Electronically Signed   By: Charlett Nose M.D.   On: 08/03/2015 16:06    Microbiology: Recent Results (from the past 240 hour(s))  Culture, Urine     Status: None (Preliminary result)   Collection Time: 08/03/15  3:41 PM  Result Value Ref Range Status   Specimen Description URINE, CATHETERIZED  Final   Special Requests NONE  Final   Culture   Final    >=100,000 COLONIES/mL GRAM NEGATIVE RODS Performed at [facility]    Report Status PENDING  Incomplete     Labs: Basic Metabolic Panel:  Recent Labs Lab 08/03/15 1050 08/03/15 1740 08/04/15 0430  NA 138  --  140  K 4.3  --  4.1  CL 101  --  106  CO2 30  --  24  GLUCOSE 97  --  87  BUN 15  --  11  CREATININE 0.70  --  0.58  CALCIUM 9.6  --  8.9  MG  --  2.0  --    Liver Function Tests: No results for input(s): AST, ALT, ALKPHOS, BILITOT, PROT, ALBUMIN in the last 168 hours. No results for input(s): LIPASE, AMYLASE in the last 168 hours. No results for input(s): AMMONIA in the last 168 hours. CBC:  Recent Labs Lab 08/03/15 1050 08/04/15 0430  WBC 8.8 8.2  NEUTROABS 5.7  --   HGB 13.8 13.7  HCT 42.3 41.3  MCV 94.6 93.7  PLT 243 176   Cardiac Enzymes:  Recent Labs Lab 08/03/15 1740 08/04/15 0001 08/04/15 0430  TROPONINI <0.03 <0.03 <0.03   BNP: BNP (last 3 results) No results for input(s): BNP in the last 8760 hours.  ProBNP (last 3 results) No results for input(s): PROBNP in the last 8760 hours.  CBG:  Recent Labs Lab 08/04/15 0745  GLUCAP 87       Signed:  THOMPSON,DANIEL MD [facility] 08/04/2015, 2:14  PM

## 2015-08-04 NOTE — Evaluation (Signed)
Physical Therapy One Time Evaluation Patient Details Name: Gabrielle [name] MRN: [id number] DOB: 06-Aug-1927 Today's Date: 08/04/2015   History of Present Illness  81 y.o. female with history of hyperlipidemia, depression, arthritis, prior history of syncope, R TKA and admitted for [facility]ope episode at [facility].  Clinical Impression  Patient evaluated by Physical Therapy with no further acute PT needs identified. All education has been completed and the patient has no further questions.   Pt presents at supervision level at this time only due to admission for syncope episode however tolerated good distance and denies any dizziness during mobility.  Pt does report occasional falls due to L knee buckling however daughter present and states family has been encouraging her to pursue TKA (has hx of R TKA).  Pt agreeable to no PT needs upon d/c and encouraged patient to continue attending exercise classes offered by facility. PT is signing off. Thank you for this referral.       Follow Up Recommendations No PT follow up    Equipment Recommendations  None recommended by PT    Recommendations for Other Services       Precautions / Restrictions Precautions Precautions: Fall      Mobility  Bed Mobility Overal bed mobility: Modified Independent                Transfers Overall transfer level: Needs assistance Equipment used: None Transfers: Sit to/from Stand Sit to Stand: Supervision            Ambulation/Gait Ambulation/Gait assistance: Supervision Ambulation Distance (Feet): 300 Feet Assistive device: None Gait Pattern/deviations: WFL(Within Functional Limits)     General Gait Details: no unsteadiness or LOB observed, denies dizziness  Stairs            Wheelchair Mobility    Modified Rankin (Stroke Patients Only)       Balance Overall balance assessment: History of Falls (states falls in past due to L knee buckling (reports needs to be replaced, hx of  RTKA))                                           Pertinent Vitals/Pain Pain Assessment: No/denies pain    Home Living   Living Arrangements: Alone   Type of Home: Independent living facility         Home Equipment: Walker - 2 wheels      Prior Function Level of Independence: Independent               Hand Dominance        Extremity/Trunk Assessment               Lower Extremity Assessment: Overall WFL for tasks assessed         Communication   Communication: No difficulties  Cognition Arousal/Alertness: Awake/alert Behavior During Therapy: WFL for tasks assessed/performed Overall Cognitive Status: Within Functional Limits for tasks assessed                      General Comments      Exercises        Assessment/Plan    PT Assessment Patent does not need any further PT services  PT Diagnosis     PT Problem List    PT Treatment Interventions     PT Goals (Current goals can be found in the Care Plan section) Acute Rehab  PT Goals PT Goal Formulation: All assessment and education complete, DC therapy    Frequency     Barriers to discharge        Co-evaluation               End of Session   Activity Tolerance: Patient tolerated treatment well Patient left: in chair;with call bell/phone within reach;with family/visitor present      Functional Assessment Tool Used: clinical judgement Functional Limitation: Mobility: Walking and moving around Mobility: Walking and Moving Around Current Status ([id number]): At least 1 percent but less than 20 percent impaired, limited or restricted Mobility: Walking and Moving Around Goal Status [phone]): At least 1 percent but less than 20 percent impaired, limited or restricted Mobility: Walking and Moving Around Discharge Status [phone]): At least 1 percent but less than 20 percent impaired, limited or restricted    Time: 1145-1202 PT Time Calculation (min) (ACUTE ONLY): 17  min   Charges:   PT Evaluation $Initial PT Evaluation Tier I: 1 Procedure     PT G Codes:   PT G-Codes **NOT FOR INPATIENT CLASS** Functional Assessment Tool Used: clinical judgement Functional Limitation: Mobility: Walking and moving around Mobility: Walking and Moving Around Current Status ([id number]): At least 1 percent but less than 20 percent impaired, limited or restricted Mobility: Walking and Moving Around Goal Status [phone]): At least 1 percent but less than 20 percent impaired, limited or restricted Mobility: Walking and Moving Around Discharge Status [phone]): At least 1 percent but less than 20 percent impaired, limited or restricted    LEMYRE,KATHrine E 08/04/2015, 1:30 PM Zenovia Jarred, PT, DPT 08/04/2015 Pager: [phone]

## 2015-08-04 NOTE — ED Provider Notes (Signed)
CSN: [id number]     Arrival date & time 08/03/15  4098 History   First MD Initiated Contact with Patient 08/03/15 [phone]     Chief Complaint  Patient presents with  . Fall     (Consider location/radiation/quality/duration/timing/severity/associated sxs/prior Treatment) HPI Comments: 81yo F w/ PMH including arthritis and HLD who p/w syncope. Just prior to arrival, patient was taking a hot shower and went to walk out when she suddenly passed out. She denies feeling lightheaded or having chest pain, shortness of breath, or palpitations prior to the episode. Her daughter heard a noise and found that she had passed out. Daughter states that she was confused for 2-3 minutes after which she returned to baseline. No jerking movements of loss of bowel/bladder continence. They called EMS who noted that she was hypotensive (unknown BP). Pt currently denies any complaints. She has been eating and drinking normally and denies any urinary symptoms, fevers, cough/cold, leg swelling/pain, abdominal pain, or recent illness. She had a syncopal episode a long time ago. No hx of blood clots or recent travel.   Patient is a 81 y.o. female presenting with fall. The history is provided by the patient and a relative.  Fall    Past Medical History  Diagnosis Date  . Arthritis     oa  . Hyperlipidemia    Past Surgical History  Procedure Laterality Date  . Tonsillectomy  age 52  . Wrist fracture surgery Right   . Benign breast tumor removed  age 32  . Abdominal hysterectomy    . Total knee arthroplasty Right 11/21/2013    Procedure: RIGHT TOTAL KNEE ARTHROPLASTY;  Surgeon: Loanne Drilling, MD;  Location: [facility];  Service: Orthopedics;  Laterality: Right;   History reviewed. No pertinent family history. Social History  Substance Use Topics  . Smoking status: Former Smoker -- 1.00 packs/day for 17 years    Types: Cigarettes    Quit date: 11/10/1965  . Smokeless tobacco: Never Used  . Alcohol Use: Yes      Comment: very occasional wine   OB History    No data available     Review of Systems  10 Systems reviewed and are negative for acute change except as noted in the HPI.   Allergies  Review of patient's allergies indicates no known allergies.  Home Medications   Prior to Admission medications   Medication Sig Start Date End Date Taking? Authorizing Provider  aspirin 81 MG tablet Take 81 mg by mouth daily.    Yes Historical Provider, MD  atorvastatin (LIPITOR) 20 MG tablet Take 20 mg by mouth daily.   Yes Historical Provider, MD  Calcium Carb-Cholecalciferol (CALCIUM 600 + D PO) Take 1 tablet by mouth 2 (two) times daily.   Yes Historical Provider, MD  Cholecalciferol (VITAMIN D) 2000 UNITS CAPS Take 4,000 Units by mouth every evening.    Yes Historical Provider, MD  citalopram (CELEXA) 20 MG tablet Take 20 mg by mouth daily.    Yes Historical Provider, MD  donepezil (ARICEPT) 10 MG tablet Take 10 mg by mouth daily. 06/19/15  Yes Historical Provider, MD  Multiple Vitamins-Minerals (ICAPS MV) TABS Take 1 tablet by mouth daily.    Yes Historical Provider, MD  Omega-3 Fatty Acids (FISH OIL) 1000 MG CAPS Take 1,000 mg by mouth every evening.   Yes Historical Provider, MD  timolol (BETIMOL) 0.25 % ophthalmic solution Place 1 drop into both eyes 2 (two) times daily.    Yes Historical Provider, MD  BP 148/53 mmHg  Pulse 54  Temp(Src) 97.6 F (36.4 C) (Oral)  Resp 20  Ht 5' 8.5" (1.74 m)  Wt 154 lb 3.2 oz (69.945 kg)  BMI 23.10 kg/m2  SpO2 100% Physical Exam  Constitutional: She is oriented to person, place, and time. She appears well-developed and well-nourished. No distress.  HENT:  Head: Normocephalic and atraumatic.  Moist mucous membranes  Eyes: Conjunctivae and EOM are normal. Pupils are equal, round, and reactive to light.  Neck: Neck supple.  Cardiovascular: Normal rate, regular rhythm and normal heart sounds.   No murmur heard. Pulmonary/Chest: Effort normal and breath  sounds normal.  Abdominal: Soft. Bowel sounds are normal. She exhibits no distension. There is no tenderness.  Musculoskeletal: She exhibits no edema or tenderness.  Neurological: She is alert and oriented to person, place, and time. She exhibits normal muscle tone.  Fluent speech  Skin: Skin is warm and dry.  Psychiatric: She has a normal mood and affect. Judgment normal.  Nursing note and vitals reviewed.   ED Course  Procedures (including critical care time) Labs Review   Imaging Review  CT Head Wo Contrast (Final result) Result time: 08/03/15 11:41:37   Final result by Rad Results In Interface (08/03/15 11:41:37)   Narrative:   CLINICAL DATA: Syncope and fall this morning. Initial encounter.  EXAM: CT HEAD WITHOUT CONTRAST  CT CERVICAL SPINE WITHOUT CONTRAST  TECHNIQUE: Multidetector CT imaging of the head and cervical spine was performed following the standard protocol without intravenous contrast. Multiplanar CT image reconstructions of the cervical spine were also generated.  COMPARISON: Head and maxillofacial CT scans 07/05/2009  FINDINGS: CT HEAD FINDINGS  There is some cortical atrophy and chronic microvascular ischemic change. No acute intracranial abnormality including hemorrhage, infarct, mass lesion, mass effect, midline shift or abnormal extra-axial fluid collection is identified. No hydrocephalus or pneumocephalus. The calvarium is intact. Imaged paranasal sinuses and mastoid air cells are clear.  CT CERVICAL SPINE FINDINGS  No fracture is identified. There is reversal of the normal cervical lordosis. Loss of disc space height and endplate spurring are worst from C4-C6. Lung apices are clear.  IMPRESSION: No acute abnormality head or cervical spine.  Atrophy and chronic microvascular ischemic change.  Cervical spondylosis.   Electronically Signed By: Drusilla Kanner M.D. On: 08/03/2015 11:41          CT Cervical Spine Wo  Contrast (Final result) Result time: 08/03/15 11:41:37   Final result by Rad Results In Interface (08/03/15 11:41:37)   Narrative:   CLINICAL DATA: Syncope and fall this morning. Initial encounter.  EXAM: CT HEAD WITHOUT CONTRAST  CT CERVICAL SPINE WITHOUT CONTRAST  TECHNIQUE: Multidetector CT imaging of the head and cervical spine was performed following the standard protocol without intravenous contrast. Multiplanar CT image reconstructions of the cervical spine were also generated.  COMPARISON: Head and maxillofacial CT scans 07/05/2009  FINDINGS: CT HEAD FINDINGS  There is some cortical atrophy and chronic microvascular ischemic change. No acute intracranial abnormality including hemorrhage, infarct, mass lesion, mass effect, midline shift or abnormal extra-axial fluid collection is identified. No hydrocephalus or pneumocephalus. The calvarium is intact. Imaged paranasal sinuses and mastoid air cells are clear.  CT CERVICAL SPINE FINDINGS  No fracture is identified. There is reversal of the normal cervical lordosis. Loss of disc space height and endplate spurring are worst from C4-C6. Lung apices are clear.  IMPRESSION: No acute abnormality head or cervical spine.  Atrophy and chronic microvascular ischemic change.  Cervical spondylosis.  Electronically Signed By: Drusilla Kanner M.D. On: 08/03/2015 11:41    I have personally reviewed and evaluated these  lab results as part of my medical decision-making.   EKG Interpretation   Date/Time:  Friday August 03 2015 11:09:52 EDT Ventricular Rate:  57 PR Interval:  211 QRS Duration: 80 QT Interval:  482 QTC Calculation: 469 R Axis:   25 Text Interpretation:  Age not entered, assumed to be  81 years old for  purpose of ECG interpretation Sinus rhythm Prolonged PR interval No  significant change since last tracing Confirmed by LITTLE MD, RACHEL  [phone]) on 08/03/2015 12:01:48 PM      MDM    Final diagnoses:  Syncope and collapse   This is a pleasant 81yo F who p/w syncopal episode that occurred in her shower this morning. Pt denies any symptoms leading up to episode. She was brought in by EMS and initially noted to be hypotensive but pt awake, well-appearing, and with reassuring VS at presentation. No neurologic deficits on exam and pt without complaints. Obtained EKG which showed sinus rhythm, no ischemic changes. Obtained basic labs including CBC, BMP, UA. I reviewed labs which were unremarkable, UA pending.   Given patient's unwitnessed syncope and fall, obtained CT of head and c-spine which showed no acute injuries and no intracranial process to explain patient's symptoms. Pt does not appear to be dehydrated and has had no recent illness. No risk factors for blood clots and patient denies any SOB, CP, or leg swelling; given normal VS, PE is unlikely. I am concerned about the patient's syncopal episode given her advanced age and risk of cardiac disease. Patient will be admitted to general medicine on telemetry for further work up of her syncopal episode.    Laurence Spates, MD 08/04/15 [phone]

## 2015-08-04 NOTE — Progress Notes (Signed)
*  PRELIMINARY RESULTS* Vascular Ultrasound Carotid Duplex (Doppler) has been completed.  Preliminary findings: Bilateral: No significant (1-39%) ICA stenosis. Antegrade vertebral flow.    Farrel Demark, RDMS, RVT  08/04/2015, 11:20 AM

## 2015-08-04 NOTE — Consult Note (Signed)
Ref: MCNEILL,[name], MD   Subjective:  Feels strong with IV fluids. Heart rate improving. Echocardiogram shows good LV systolic function but mild AI, Trace PI, Mild TR and moderate MR.  Objective:  Vital Signs in the last 24 hours: Temp:  [97.6 F (36.4 C)-98 F (36.7 C)] 97.6 F (36.4 C) (09/24 0515) Pulse Rate:  [54-57] 54 (09/24 0515) Cardiac Rhythm:  [-] Sinus bradycardia;Heart block (09/24 0701) Resp:  [18-20] 20 (09/24 0515) BP: (132-151)/(48-59) 148/53 mmHg (09/24 0515) SpO2:  [97 %-100 %] 100 % (09/24 0515) Weight:  [69.945 kg (154 lb 3.2 oz)-71.2 kg (156 lb 15.5 oz)] 69.945 kg (154 lb 3.2 oz) (09/24 0515)  Physical Exam: BP Readings from Last 1 Encounters:  08/04/15 148/53    Wt Readings from Last 1 Encounters:  08/04/15 69.945 kg (154 lb 3.2 oz)    Weight change:   HEENT: [place]/AT, Eyes- PERL, EOMI, Conjunctiva-Pink, Sclera-Non-icteric Neck: No JVD, No bruit, Trachea midline. Lungs:  Clear, Bilateral. Cardiac:  Regular rhythm, normal S1 and S2, no S3. III/VI systolic murmur. Abdomen:  Soft, non-tender. Extremities:  No edema present. No cyanosis. No clubbing. CNS: AxOx3, Cranial nerves grossly intact, moves all 4 extremities. Right handed. Skin: Warm and dry.   Intake/Output from previous day: 09/23 0701 - 09/24 0700 In: 1363.8 [P.O.:240; I.V.:1123.8] Out: 1300 [Urine:1300]    Lab Results: BMET    Component Value Date/Time   NA 140 08/04/2015 0430   NA 138 08/03/2015 1050   NA 139 11/14/2014 0433   K 4.1 08/04/2015 0430   K 4.3 08/03/2015 1050   K 4.1 11/14/2014 0433   CL 106 08/04/2015 0430   CL 101 08/03/2015 1050   CL 97 11/14/2014 0433   CO2 24 08/04/2015 0430   CO2 30 08/03/2015 1050   CO2 32 11/14/2014 0425   GLUCOSE 87 08/04/2015 0430   GLUCOSE 97 08/03/2015 1050   GLUCOSE 148* 11/14/2014 0433   BUN 11 08/04/2015 0430   BUN 15 08/03/2015 1050   BUN 15 11/14/2014 0433   CREATININE 0.58 08/04/2015 0430   CREATININE 0.70 08/03/2015 1050   CREATININE 0.90 11/14/2014 0433   CALCIUM 8.9 08/04/2015 0430   CALCIUM 9.6 08/03/2015 1050   CALCIUM 10.5 11/14/2014 0425   GFRNONAA >60 08/04/2015 0430   GFRNONAA >60 08/03/2015 1050   GFRNONAA 63* 11/14/2014 0425   GFRAA >60 08/04/2015 0430   GFRAA >60 08/03/2015 1050   GFRAA 74* 11/14/2014 0425   CBC    Component Value Date/Time   WBC 8.2 08/04/2015 0430   RBC 4.41 08/04/2015 0430   HGB 13.7 08/04/2015 0430   HCT 41.3 08/04/2015 0430   PLT 176 08/04/2015 0430   MCV 93.7 08/04/2015 0430   MCH 31.1 08/04/2015 0430   MCHC 33.2 08/04/2015 0430   RDW 13.3 08/04/2015 0430   LYMPHSABS 2.0 08/03/2015 1050   MONOABS 0.9 08/03/2015 1050   EOSABS 0.2 08/03/2015 1050   BASOSABS 0.0 08/03/2015 1050   HEPATIC Function Panel  Recent Labs  11/14/14 0425  PROT 7.6   HEMOGLOBIN A1C No components found for: HGA1C,  MPG CARDIAC ENZYMES Lab Results  Component Value Date   TROPONINI <0.03 08/04/2015   TROPONINI <0.03 08/04/2015   TROPONINI <0.03 08/03/2015   BNP No results for input(s): PROBNP in the last 8760 hours. TSH  Recent Labs  08/03/15 1740  TSH 2.359   CHOLESTEROL No results for input(s): CHOL in the last 8760 hours.  Scheduled Meds: . aspirin EC  81 mg  Oral Daily  . atorvastatin  20 mg Oral Daily  . citalopram  20 mg Oral Daily  . donepezil  10 mg Oral Daily  . enoxaparin (LOVENOX) injection  40 mg Subcutaneous Q24H  . omega-3 acid ethyl esters  1 g Oral Daily  . sodium chloride  3 mL Intravenous Q12H  . timolol  1 drop Both Eyes BID   Continuous Infusions: . sodium chloride 75 mL/hr at 08/04/15 0354   PRN Meds:.acetaminophen **OR** acetaminophen, albuterol, ondansetron **OR** ondansetron (ZOFRAN) IV, senna-docusate, sorbitol  Assessment/Plan: Syncope, possible vasovagal Hypertension Arthritis Glaucoma Depression Possible adverse effect of eye drops with B-blocker   Ask ophthalmologist  to change eye drop. Use walker till feel strong enough to  change over to cane. F/U in 10 days.     Orpah Cobb  MD  08/04/2015, 1:53 PM

## 2015-08-04 NOTE — Progress Notes (Signed)
  2D Echocardiogram has been performed.  Gabrielle [name] 08/04/2015, 12:46 PM

## 2015-08-05 LAB — URINE CULTURE

## 2015-08-08 IMAGING — CT CT ABD-PELV W/ CM
1 of 5 series · 13 of 32 positions shown, 18 images · IV contrast (100 ML OMNI 300)
Comparison: 03/09/2006

CLINICAL DATA: Right flank pain. Nausea, vomiting, diarrhea.
Microhematuria. White cell count 11.4.

EXAM:
CT ABDOMEN AND PELVIS WITH CONTRAST
TECHNIQUE: Multidetector CT imaging of the abdomen and pelvis was performed
using the standard protocol following bolus administration of
intravenous contrast.
CONTRAST:  100mL OMNIPAQUE IOHEXOL 300 MG/ML  SOLN

[Series 2: abd/pel with · axial · 0.75mm/px · z∈[+1122,+1492]mm · 13 of 84 slices shown, 18 images]
[im 5/84  soft-tissue]
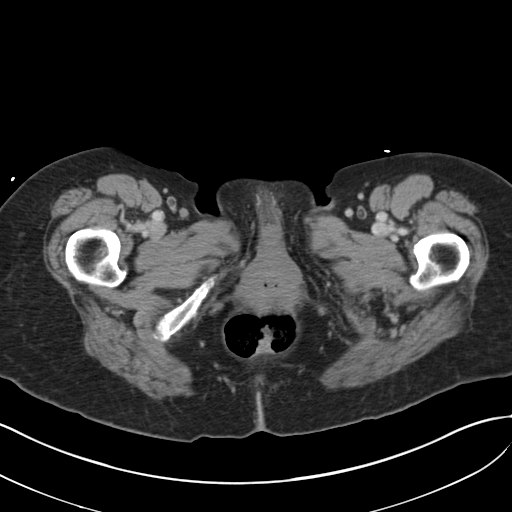
[im 5/84  bone]
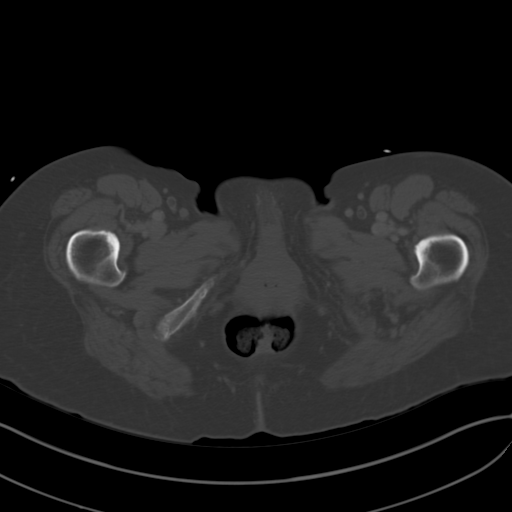
[im 15/84  soft-tissue]
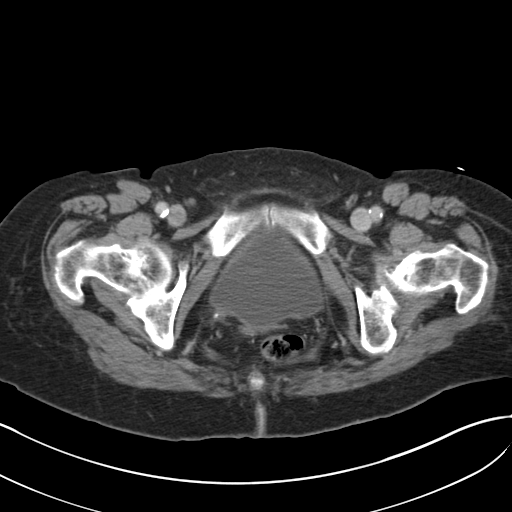
[im 20/84  soft-tissue]
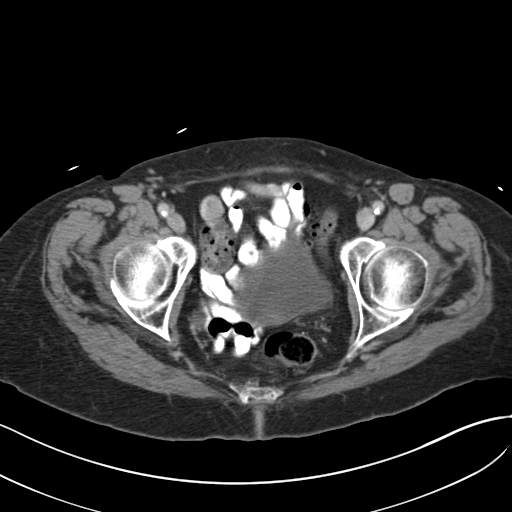
[im 25/84  soft-tissue]
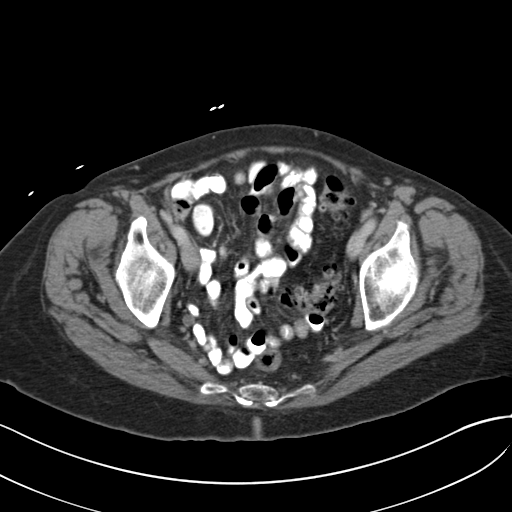
[im 35/84  soft-tissue]
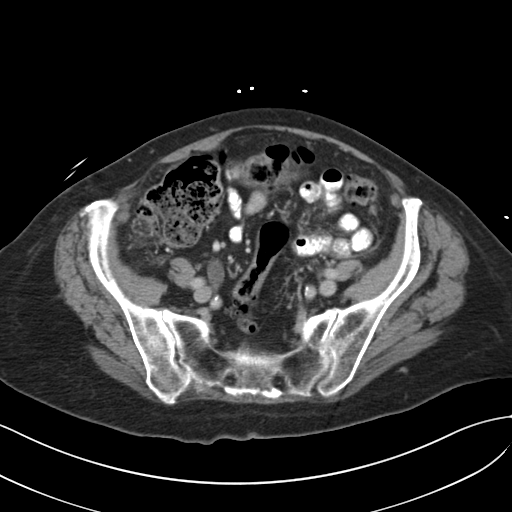
[im 40/84  soft-tissue]
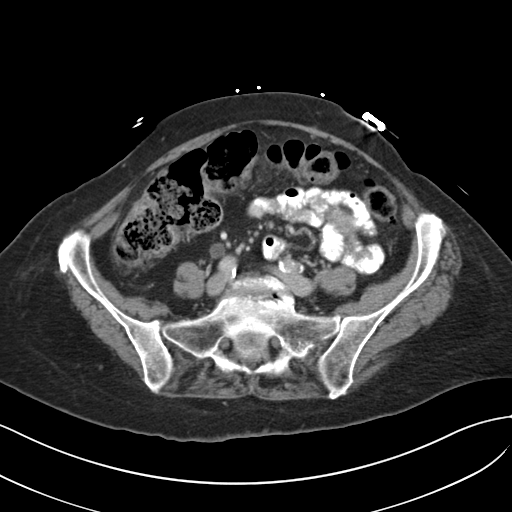
[im 44/84  soft-tissue]
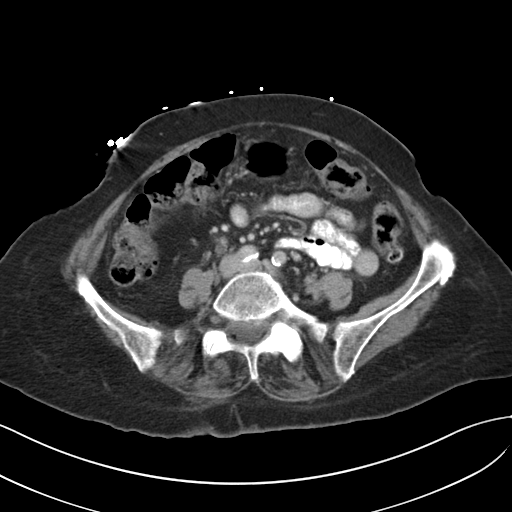
[im 54/84  soft-tissue]
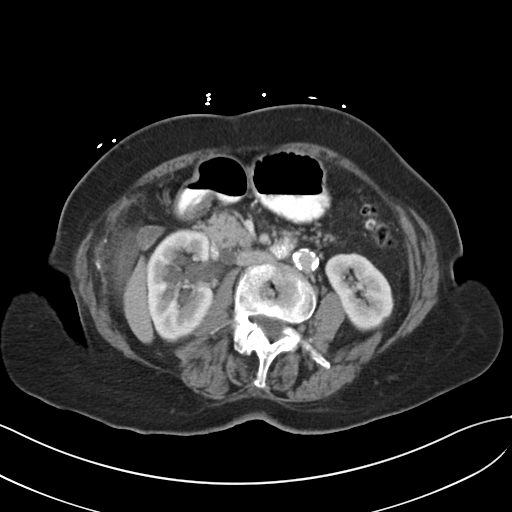
[im 59/84  soft-tissue]
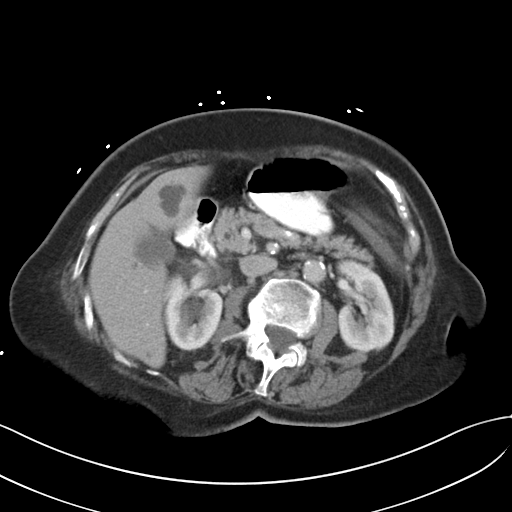
[im 59/84  bone]
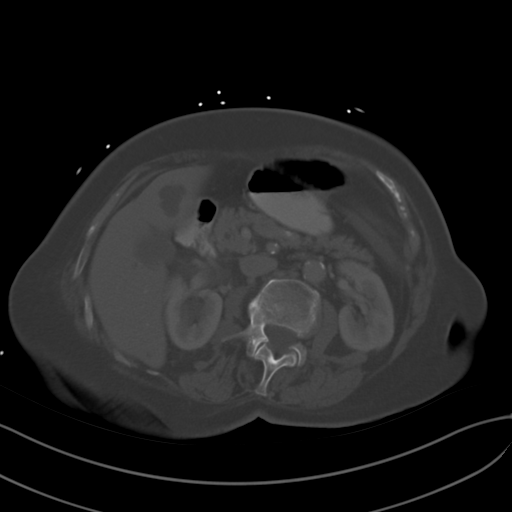
[im 64/84  soft-tissue]
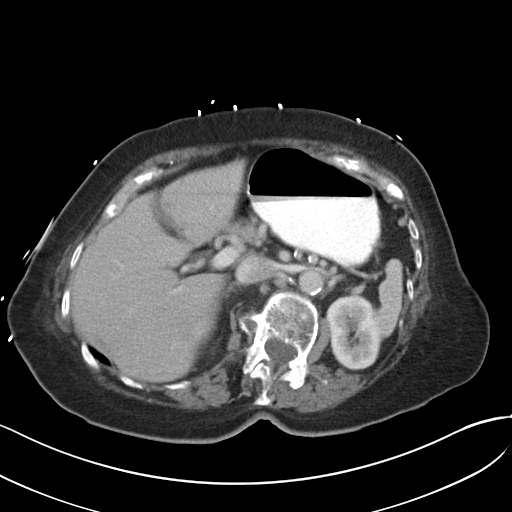
[im 64/84  lung]
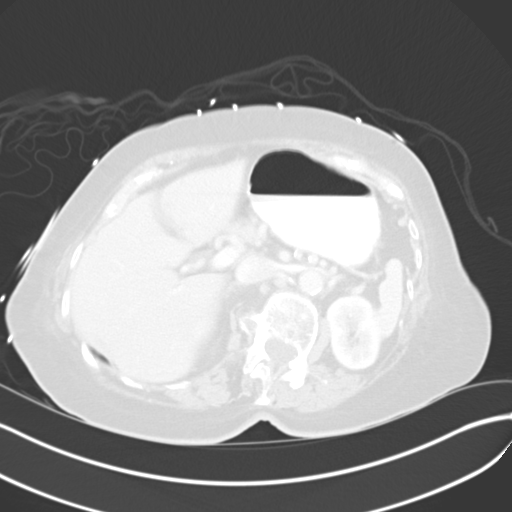
[im 69/84  lung]
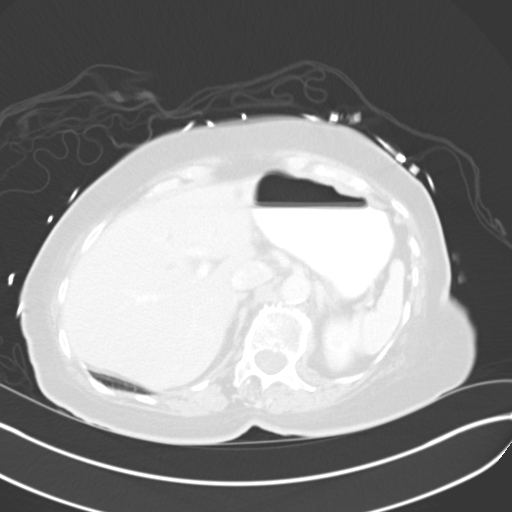
[im 74/84  soft-tissue]
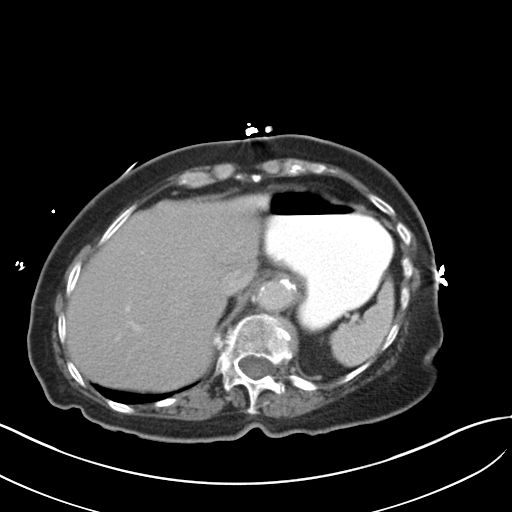
[im 74/84  lung]
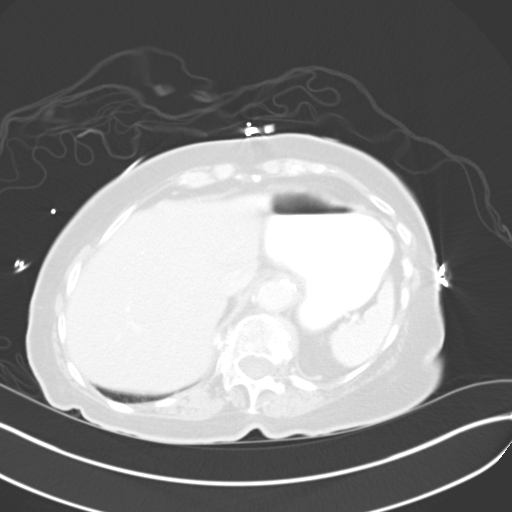
[im 79/84  soft-tissue]
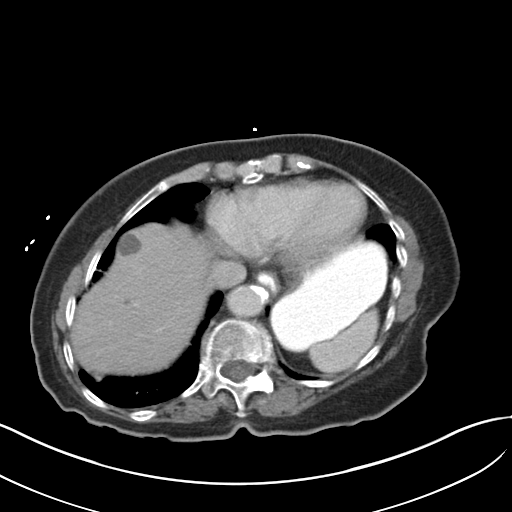
[im 79/84  lung]
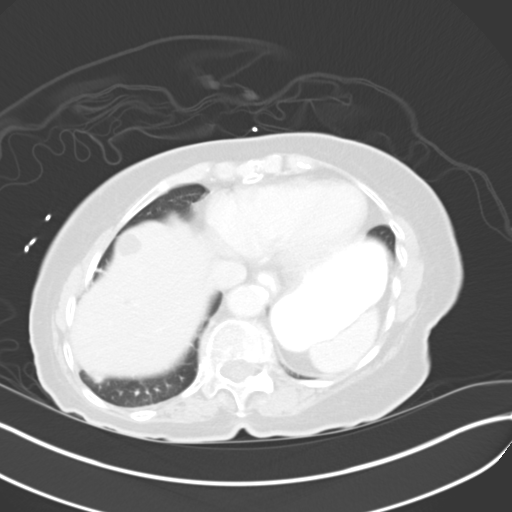

[13 of 32 positions shown; findings below may reference images not displayed]

FINDINGS: Mild dependent changes in the lung bases.

Circumscribed low-attenuation changes throughout the liver, largest
in the lateral segment left lobe measuring 1.6 x 2.3 cm. Lesions
were present previously but have enlarged. Appearance is most
consistent with cysts. The gallbladder, spleen, pancreas, adrenal
glands, inferior vena cava, and retroperitoneal lymph nodes are
unremarkable. Calcification of aorta without aneurysm. 5 mm stone in
the distal right ureter just above the ureterovesical junction with
proximal hydronephrosis and hydroureter. No significant perirenal
stranding. Multiple additional cysts in the right kidney without
obstruction. Subcentimeter cysts on the left kidney. No stone or
hydronephrosis on the left. Stomach, small bowel, and colon are
grossly normal. Under distention limits evaluation. No free air or
free fluid in the abdomen.

Pelvis: Appendix is normal. Diverticulosis of the sigmoid colon. No
evidence of diverticulitis. Bladder wall is not thickened. No free
or loculated pelvic fluid collections. Surgical absence of the
uterus. No pelvic mass or lymphadenopathy. Degenerative changes and
scoliosis of the lumbar spine. No destructive bone lesions.
IMPRESSION: 5 mm stone in the distal right ureter with moderate proximal
obstruction. Digital nonobstructing intrarenal stones on the right.
Multiple circumscribed hypodense lesions in the liver consistent
with cysts although enlarging since previous study. Diverticulosis
of sigmoid colon without evidence of diverticulitis.

## 2015-08-15 DIAGNOSIS — R55 Syncope and collapse: Secondary | ICD-10-CM | POA: Diagnosis not present

## 2015-08-15 DIAGNOSIS — F3340 Major depressive disorder, recurrent, in remission, unspecified: Secondary | ICD-10-CM | POA: Diagnosis not present

## 2015-08-15 DIAGNOSIS — W010XXA Fall on same level from slipping, tripping and stumbling without subsequent striking against object, initial encounter: Secondary | ICD-10-CM | POA: Diagnosis not present

## 2015-08-15 DIAGNOSIS — E782 Mixed hyperlipidemia: Secondary | ICD-10-CM | POA: Diagnosis not present

## 2015-08-15 DIAGNOSIS — H409 Unspecified glaucoma: Secondary | ICD-10-CM | POA: Diagnosis not present

## 2015-08-15 DIAGNOSIS — H3530 Unspecified macular degeneration: Secondary | ICD-10-CM | POA: Diagnosis not present

## 2015-08-15 DIAGNOSIS — G3184 Mild cognitive impairment, so stated: Secondary | ICD-10-CM | POA: Diagnosis not present

## 2015-08-15 DIAGNOSIS — M810 Age-related osteoporosis without current pathological fracture: Secondary | ICD-10-CM | POA: Diagnosis not present

## 2015-08-23 DIAGNOSIS — H35021 Exudative retinopathy, right eye: Secondary | ICD-10-CM | POA: Diagnosis not present

## 2015-08-23 DIAGNOSIS — H35022 Exudative retinopathy, left eye: Secondary | ICD-10-CM | POA: Diagnosis not present

## 2015-08-23 DIAGNOSIS — H35029 Exudative retinopathy, unspecified eye: Secondary | ICD-10-CM | POA: Diagnosis not present

## 2015-08-23 DIAGNOSIS — H401192 Primary open-angle glaucoma, unspecified eye, moderate stage: Secondary | ICD-10-CM | POA: Diagnosis not present

## 2015-09-03 DIAGNOSIS — R296 Repeated falls: Secondary | ICD-10-CM | POA: Diagnosis not present

## 2015-09-03 DIAGNOSIS — M6281 Muscle weakness (generalized): Secondary | ICD-10-CM | POA: Diagnosis not present

## 2015-09-03 DIAGNOSIS — R2689 Other abnormalities of gait and mobility: Secondary | ICD-10-CM | POA: Diagnosis not present

## 2015-09-04 DIAGNOSIS — R2689 Other abnormalities of gait and mobility: Secondary | ICD-10-CM | POA: Diagnosis not present

## 2015-09-04 DIAGNOSIS — M6281 Muscle weakness (generalized): Secondary | ICD-10-CM | POA: Diagnosis not present

## 2015-09-04 DIAGNOSIS — R296 Repeated falls: Secondary | ICD-10-CM | POA: Diagnosis not present

## 2015-09-05 DIAGNOSIS — M6281 Muscle weakness (generalized): Secondary | ICD-10-CM | POA: Diagnosis not present

## 2015-09-05 DIAGNOSIS — R296 Repeated falls: Secondary | ICD-10-CM | POA: Diagnosis not present

## 2015-09-05 DIAGNOSIS — R2689 Other abnormalities of gait and mobility: Secondary | ICD-10-CM | POA: Diagnosis not present

## 2015-09-06 DIAGNOSIS — R2689 Other abnormalities of gait and mobility: Secondary | ICD-10-CM | POA: Diagnosis not present

## 2015-09-06 DIAGNOSIS — M6281 Muscle weakness (generalized): Secondary | ICD-10-CM | POA: Diagnosis not present

## 2015-09-06 DIAGNOSIS — R296 Repeated falls: Secondary | ICD-10-CM | POA: Diagnosis not present

## 2015-09-07 DIAGNOSIS — R2689 Other abnormalities of gait and mobility: Secondary | ICD-10-CM | POA: Diagnosis not present

## 2015-09-07 DIAGNOSIS — R296 Repeated falls: Secondary | ICD-10-CM | POA: Diagnosis not present

## 2015-09-07 DIAGNOSIS — M6281 Muscle weakness (generalized): Secondary | ICD-10-CM | POA: Diagnosis not present

## 2015-09-10 DIAGNOSIS — R296 Repeated falls: Secondary | ICD-10-CM | POA: Diagnosis not present

## 2015-09-10 DIAGNOSIS — M6281 Muscle weakness (generalized): Secondary | ICD-10-CM | POA: Diagnosis not present

## 2015-09-10 DIAGNOSIS — R2689 Other abnormalities of gait and mobility: Secondary | ICD-10-CM | POA: Diagnosis not present

## 2015-09-11 DIAGNOSIS — R278 Other lack of coordination: Secondary | ICD-10-CM | POA: Diagnosis not present

## 2015-09-11 DIAGNOSIS — R2689 Other abnormalities of gait and mobility: Secondary | ICD-10-CM | POA: Diagnosis not present

## 2015-09-11 DIAGNOSIS — R41841 Cognitive communication deficit: Secondary | ICD-10-CM | POA: Diagnosis not present

## 2015-09-11 DIAGNOSIS — R296 Repeated falls: Secondary | ICD-10-CM | POA: Diagnosis not present

## 2015-09-11 DIAGNOSIS — M6281 Muscle weakness (generalized): Secondary | ICD-10-CM | POA: Diagnosis not present

## 2015-09-27 DIAGNOSIS — R55 Syncope and collapse: Secondary | ICD-10-CM | POA: Diagnosis not present

## 2015-09-27 DIAGNOSIS — I4589 Other specified conduction disorders: Secondary | ICD-10-CM | POA: Diagnosis not present

## 2015-10-11 DIAGNOSIS — R296 Repeated falls: Secondary | ICD-10-CM | POA: Diagnosis not present

## 2015-10-11 DIAGNOSIS — R41841 Cognitive communication deficit: Secondary | ICD-10-CM | POA: Diagnosis not present

## 2015-10-11 DIAGNOSIS — R2689 Other abnormalities of gait and mobility: Secondary | ICD-10-CM | POA: Diagnosis not present

## 2015-10-11 DIAGNOSIS — M6281 Muscle weakness (generalized): Secondary | ICD-10-CM | POA: Diagnosis not present

## 2015-10-15 DIAGNOSIS — R2689 Other abnormalities of gait and mobility: Secondary | ICD-10-CM | POA: Diagnosis not present

## 2015-10-15 DIAGNOSIS — R41841 Cognitive communication deficit: Secondary | ICD-10-CM | POA: Diagnosis not present

## 2015-10-15 DIAGNOSIS — M6281 Muscle weakness (generalized): Secondary | ICD-10-CM | POA: Diagnosis not present

## 2015-10-15 DIAGNOSIS — R296 Repeated falls: Secondary | ICD-10-CM | POA: Diagnosis not present

## 2015-10-17 DIAGNOSIS — M810 Age-related osteoporosis without current pathological fracture: Secondary | ICD-10-CM | POA: Diagnosis not present

## 2015-10-18 DIAGNOSIS — H26491 Other secondary cataract, right eye: Secondary | ICD-10-CM | POA: Diagnosis not present

## 2015-10-18 DIAGNOSIS — H401132 Primary open-angle glaucoma, bilateral, moderate stage: Secondary | ICD-10-CM | POA: Diagnosis not present

## 2015-10-19 DIAGNOSIS — R41841 Cognitive communication deficit: Secondary | ICD-10-CM | POA: Diagnosis not present

## 2015-10-19 DIAGNOSIS — R296 Repeated falls: Secondary | ICD-10-CM | POA: Diagnosis not present

## 2015-10-19 DIAGNOSIS — R2689 Other abnormalities of gait and mobility: Secondary | ICD-10-CM | POA: Diagnosis not present

## 2015-10-19 DIAGNOSIS — M6281 Muscle weakness (generalized): Secondary | ICD-10-CM | POA: Diagnosis not present

## 2015-10-24 DIAGNOSIS — M6281 Muscle weakness (generalized): Secondary | ICD-10-CM | POA: Diagnosis not present

## 2015-10-24 DIAGNOSIS — R296 Repeated falls: Secondary | ICD-10-CM | POA: Diagnosis not present

## 2015-10-24 DIAGNOSIS — R2689 Other abnormalities of gait and mobility: Secondary | ICD-10-CM | POA: Diagnosis not present

## 2015-10-24 DIAGNOSIS — R41841 Cognitive communication deficit: Secondary | ICD-10-CM | POA: Diagnosis not present

## 2015-10-31 DIAGNOSIS — R2689 Other abnormalities of gait and mobility: Secondary | ICD-10-CM | POA: Diagnosis not present

## 2015-10-31 DIAGNOSIS — R296 Repeated falls: Secondary | ICD-10-CM | POA: Diagnosis not present

## 2015-10-31 DIAGNOSIS — M6281 Muscle weakness (generalized): Secondary | ICD-10-CM | POA: Diagnosis not present

## 2015-10-31 DIAGNOSIS — R41841 Cognitive communication deficit: Secondary | ICD-10-CM | POA: Diagnosis not present

## 2015-11-06 IMAGING — MR MR HEAD W/O CM
10 series · 48 of 48 positions shown · non-contrast
Comparison: None.

CLINICAL DATA: Memory loss.

EXAM:
MRI HEAD WITHOUT CONTRAST
TECHNIQUE: Multiplanar, multiecho pulse sequences of the brain and surrounding
structures were obtained without intravenous contrast.

[Series 2: T1 · sagittal · 5.0mm · 0.45mm/px · 1 of 21 slices shown]
[im 1/21]
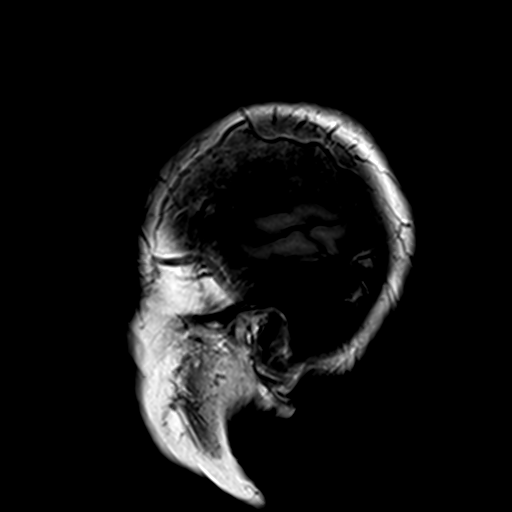

[Series 3: DWI · axial · 3.0mm · 1.80mm/px · z∈[-37,+110]mm · 10 of 100 slices shown (1 of 4)]
[im 1/100]
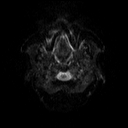
[im 12/100]
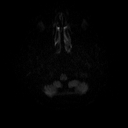
[im 23/100]
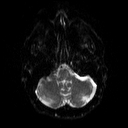
[im 34/100]
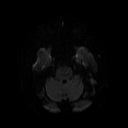
[im 45/100]
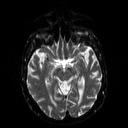
[im 56/100]
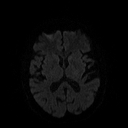
[im 67/100]
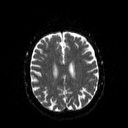
[im 78/100]
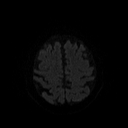
[im 89/100]
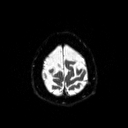
[im 100/100]
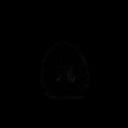

[Series 4: DWI · axial · 3.0mm · 1.80mm/px · z∈[-37,+110]mm · 5 of 49 slices shown (2 of 4)]
[im 1/49]
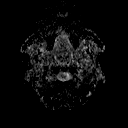
[im 13/49]
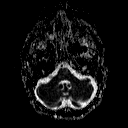
[im 25/49]
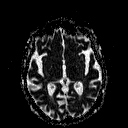
[im 37/49]
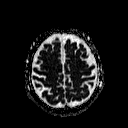
[im 49/49]
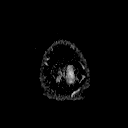

[Series 5: DWI · coronal · 5.0mm · 1.80mm/px · 6 of 64 slices shown (3 of 4)]
[im 1/64]
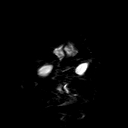
[im 13/64]
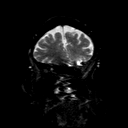
[im 26/64]
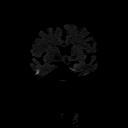
[im 38/64]
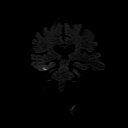
[im 51/64]
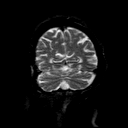
[im 64/64]
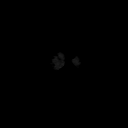

[Series 6: DWI · coronal · 5.0mm · 1.80mm/px · 3 of 32 slices shown (4 of 4)]
[im 1/32]
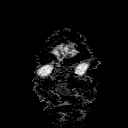
[im 16/32]
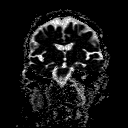
[im 32/32]
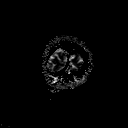

[Series 8: swi_images · axial · 2.0mm · 0.90mm/px · z∈[-42,+116]mm · 8 of 80 slices shown]
[im 1/80]
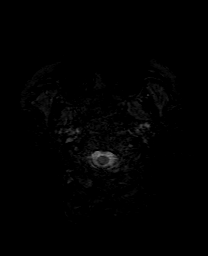
[im 12/80]
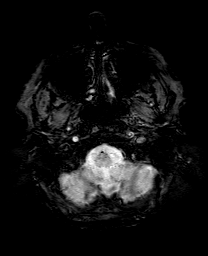
[im 23/80]
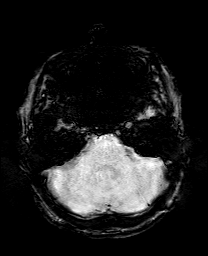
[im 34/80]
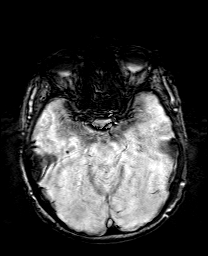
[im 46/80]
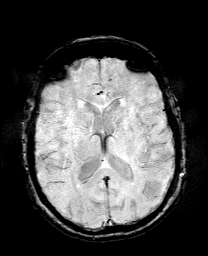
[im 57/80]
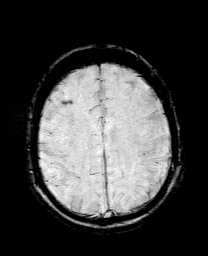
[im 68/80]
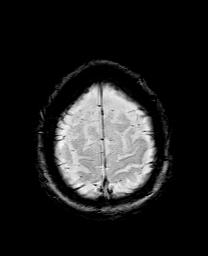
[im 80/80]
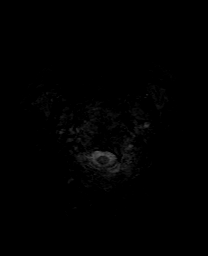

[Series 9: T2 · axial · 5.0mm · 0.51mm/px · z∈[-34,+108]mm · 2 of 22 slices shown (1 of 2)]
[im 1/22]
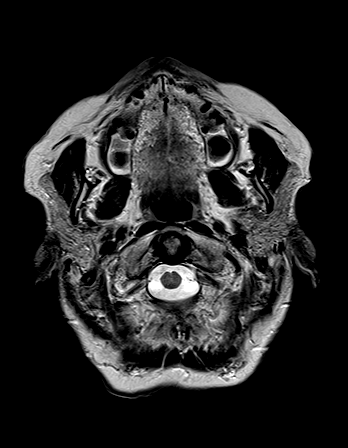
[im 22/22]
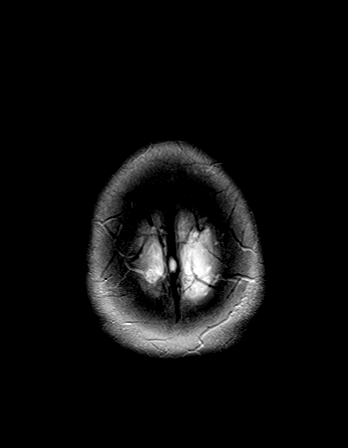

[Series 10: FLAIR · axial · 5.0mm · 0.45mm/px · z∈[-34,+107]mm · 2 of 22 slices shown]
[im 1/22]
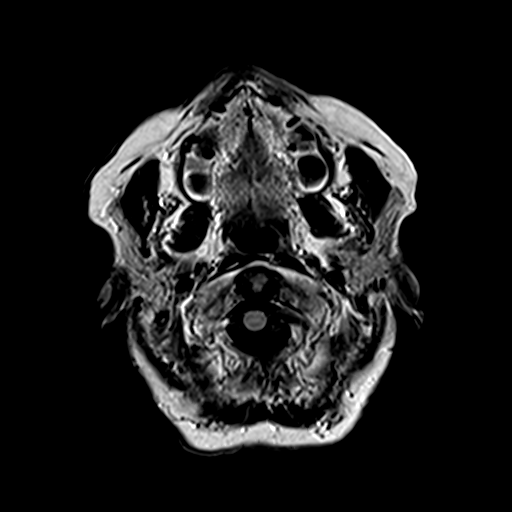
[im 22/22]
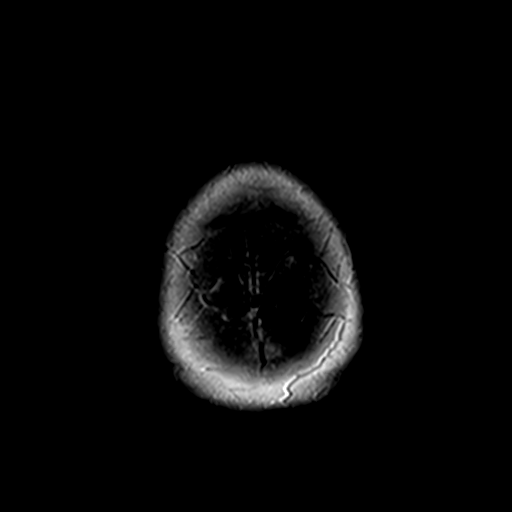

[Series 11: t1_mpr_tra · axial · 2.0mm · 0.45mm/px · z∈[-42,+116]mm · 8 of 80 slices shown]
[im 1/80]
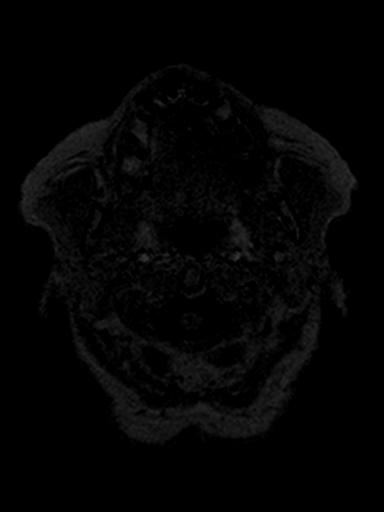
[im 12/80]
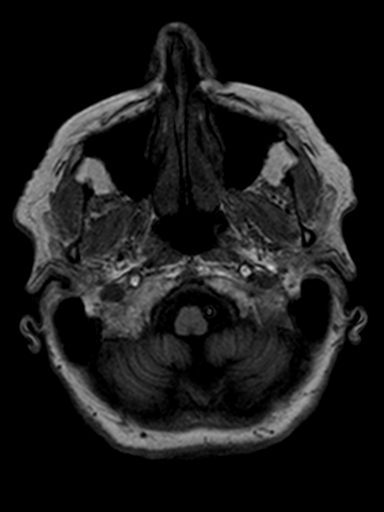
[im 23/80]
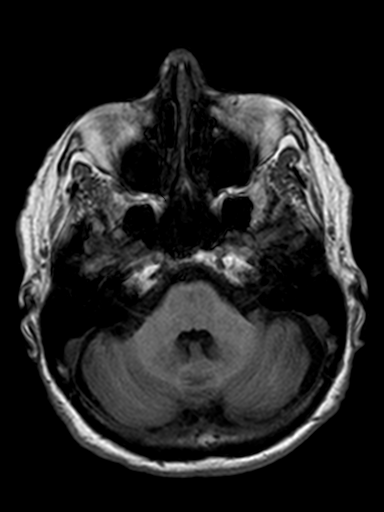
[im 34/80]
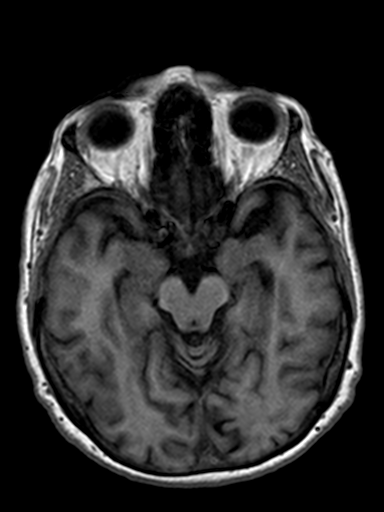
[im 46/80]
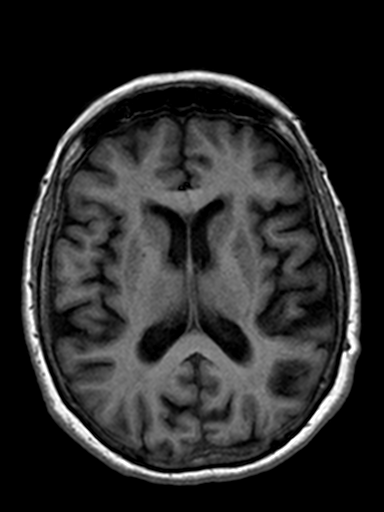
[im 57/80]
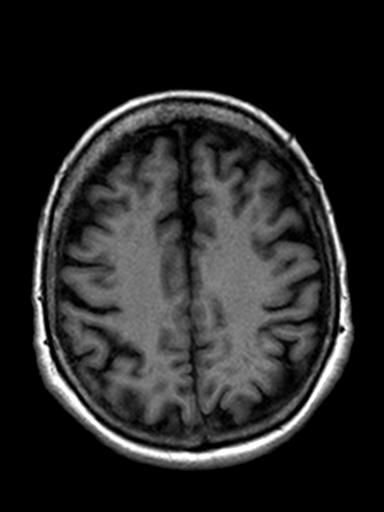
[im 68/80]
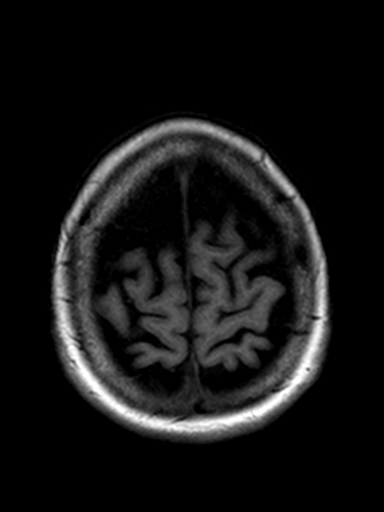
[im 80/80]
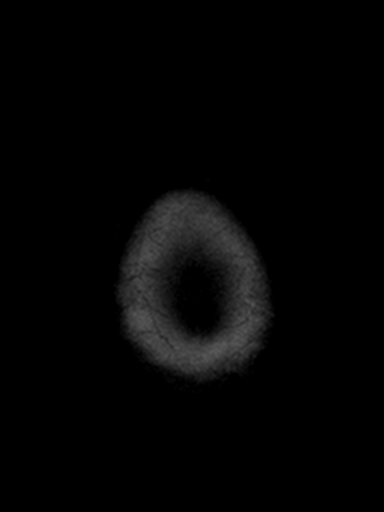

[Series 12: T2 · coronal · 5.0mm · 0.45mm/px · 3 of 26 slices shown (2 of 2)]
[im 1/26]
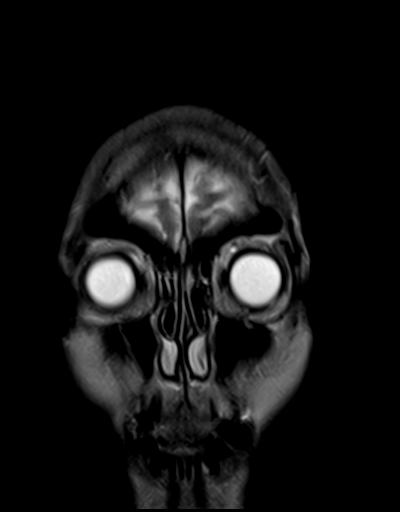
[im 13/26]
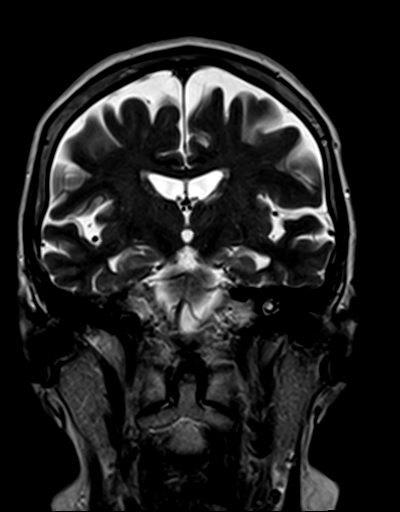
[im 26/26]
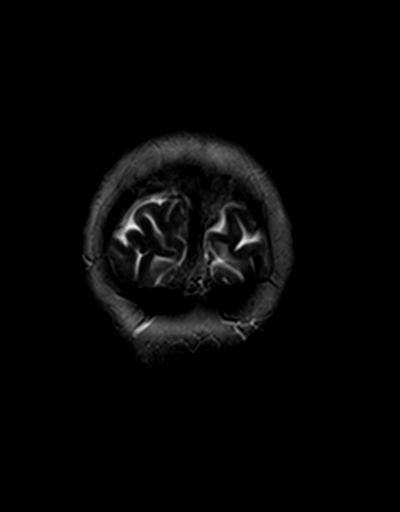

[48 of 48 positions shown; findings below may reference images not displayed]

FINDINGS: Moderate generalized atrophy is advanced for age. Mild
periventricular white matter changes are noted. Additional white
matter changes are present in the left centrum semi ovale. The
ventricles are proportionate to the degree of atrophy. Insert pass
fluid

Abnormal signal is evident in the right vertebral artery. Flow is
present in the remaining intracranial arteries.

Bilateral lens replacements are present. The globes and orbits are
otherwise intact. The paranasal sinuses and mastoid air cells are
clear.
IMPRESSION: 1. Age advanced atrophy and white matter disease. This likely
reflects the sequela of chronic microvascular ischemia.
2. Abnormal signal in the right vertebral artery suggesting slow or
occluded flow.
3. No acute abnormality.

## 2015-11-07 DIAGNOSIS — R296 Repeated falls: Secondary | ICD-10-CM | POA: Diagnosis not present

## 2015-11-07 DIAGNOSIS — R2689 Other abnormalities of gait and mobility: Secondary | ICD-10-CM | POA: Diagnosis not present

## 2015-11-07 DIAGNOSIS — R41841 Cognitive communication deficit: Secondary | ICD-10-CM | POA: Diagnosis not present

## 2015-11-07 DIAGNOSIS — M6281 Muscle weakness (generalized): Secondary | ICD-10-CM | POA: Diagnosis not present

## 2015-11-14 DIAGNOSIS — R41841 Cognitive communication deficit: Secondary | ICD-10-CM | POA: Diagnosis not present

## 2015-11-15 DIAGNOSIS — R41841 Cognitive communication deficit: Secondary | ICD-10-CM | POA: Diagnosis not present

## 2015-11-20 DIAGNOSIS — R410 Disorientation, unspecified: Secondary | ICD-10-CM | POA: Diagnosis not present

## 2015-11-20 DIAGNOSIS — F3341 Major depressive disorder, recurrent, in partial remission: Secondary | ICD-10-CM | POA: Diagnosis not present

## 2015-11-20 DIAGNOSIS — F0150 Vascular dementia without behavioral disturbance: Secondary | ICD-10-CM | POA: Diagnosis not present

## 2015-11-20 DIAGNOSIS — Z7189 Other specified counseling: Secondary | ICD-10-CM | POA: Diagnosis not present

## 2016-01-14 DIAGNOSIS — H35372 Puckering of macula, left eye: Secondary | ICD-10-CM | POA: Diagnosis not present

## 2016-01-14 DIAGNOSIS — H353232 Exudative age-related macular degeneration, bilateral, with inactive choroidal neovascularization: Secondary | ICD-10-CM | POA: Diagnosis not present

## 2016-01-14 DIAGNOSIS — H35341 Macular cyst, hole, or pseudohole, right eye: Secondary | ICD-10-CM | POA: Diagnosis not present

## 2016-01-14 DIAGNOSIS — H35432 Paving stone degeneration of retina, left eye: Secondary | ICD-10-CM | POA: Diagnosis not present

## 2016-02-04 DIAGNOSIS — G3184 Mild cognitive impairment, so stated: Secondary | ICD-10-CM | POA: Diagnosis not present

## 2016-02-04 DIAGNOSIS — F329 Major depressive disorder, single episode, unspecified: Secondary | ICD-10-CM | POA: Diagnosis not present

## 2016-02-04 DIAGNOSIS — L298 Other pruritus: Secondary | ICD-10-CM | POA: Diagnosis not present

## 2016-02-04 DIAGNOSIS — E782 Mixed hyperlipidemia: Secondary | ICD-10-CM | POA: Diagnosis not present

## 2016-02-04 DIAGNOSIS — Z7189 Other specified counseling: Secondary | ICD-10-CM | POA: Diagnosis not present

## 2016-02-04 DIAGNOSIS — M810 Age-related osteoporosis without current pathological fracture: Secondary | ICD-10-CM | POA: Diagnosis not present

## 2016-02-04 DIAGNOSIS — E559 Vitamin D deficiency, unspecified: Secondary | ICD-10-CM | POA: Diagnosis not present

## 2016-02-04 DIAGNOSIS — R7301 Impaired fasting glucose: Secondary | ICD-10-CM | POA: Diagnosis not present

## 2016-02-29 DIAGNOSIS — H401112 Primary open-angle glaucoma, right eye, moderate stage: Secondary | ICD-10-CM | POA: Diagnosis not present

## 2016-02-29 DIAGNOSIS — H401122 Primary open-angle glaucoma, left eye, moderate stage: Secondary | ICD-10-CM | POA: Diagnosis not present

## 2016-04-17 DIAGNOSIS — M810 Age-related osteoporosis without current pathological fracture: Secondary | ICD-10-CM | POA: Diagnosis not present

## 2016-04-23 DIAGNOSIS — R110 Nausea: Secondary | ICD-10-CM | POA: Diagnosis not present

## 2016-04-26 IMAGING — CT CT HEAD W/O CM
3 of 6 series · 14 of 47 positions shown, 16 images · non-contrast
Comparison: Head and maxillofacial CT scans 07/05/2009

CLINICAL DATA: Syncope and fall this morning.  Initial encounter.

EXAM:
CT HEAD WITHOUT CONTRAST
CT CERVICAL SPINE WITHOUT CONTRAST
TECHNIQUE: Multidetector CT imaging of the head and cervical spine was
performed following the standard protocol without intravenous
contrast. Multiplanar CT image reconstructions of the cervical spine
were also generated.

[Series 7: axial recon · axial · 0.23mm/px · z∈[+1456,+1607]mm · 8 of 100 slices shown, 10 images]
[im 10/100  brain]
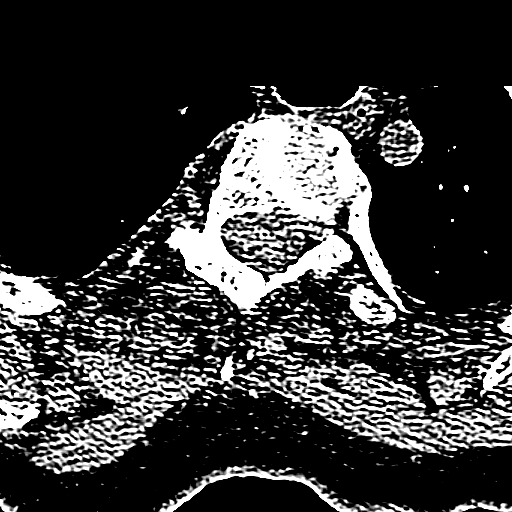
[im 10/100  bone]
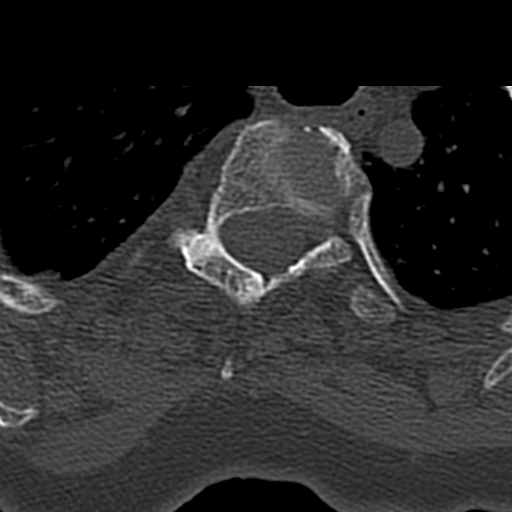
[im 20/100  brain]
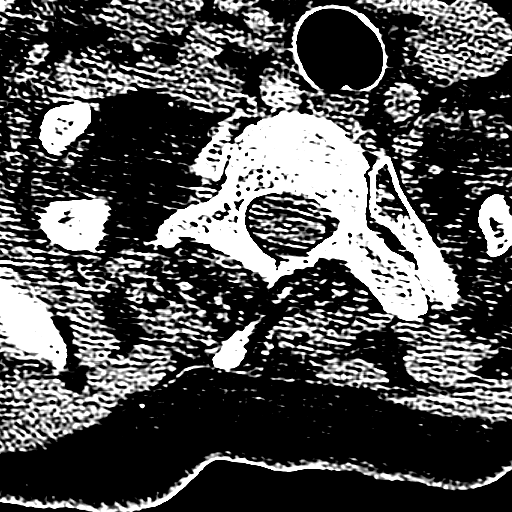
[im 30/100  brain]
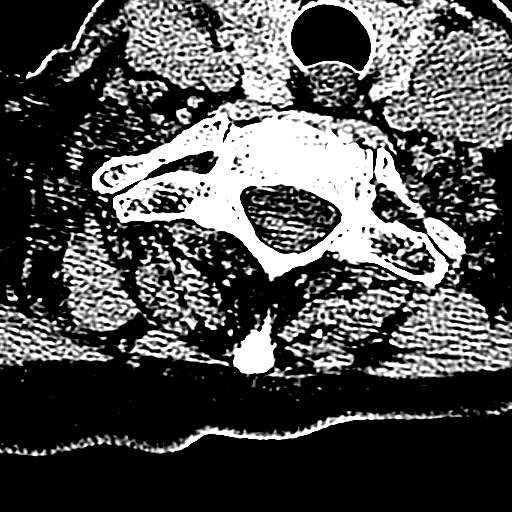
[im 40/100  brain]
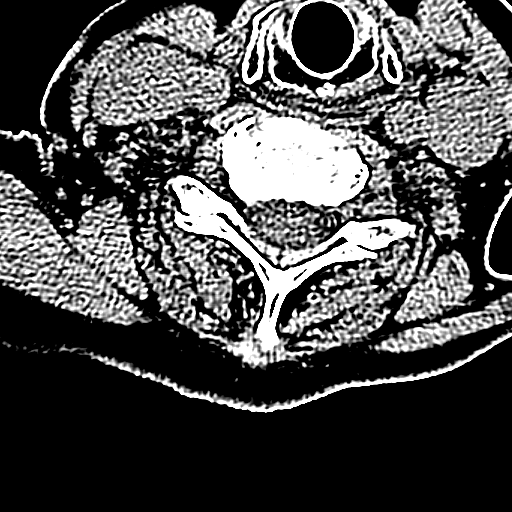
[im 60/100  brain]
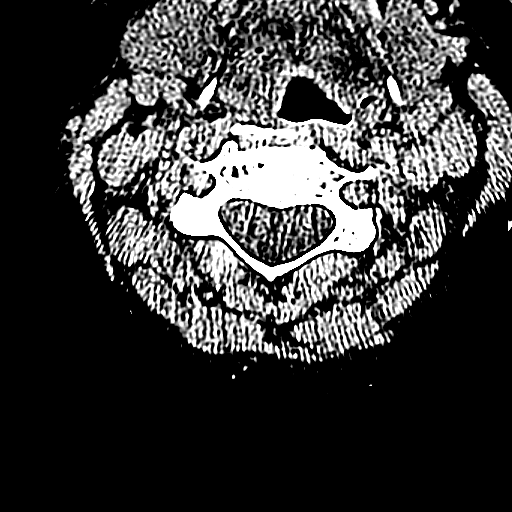
[im 60/100  bone]
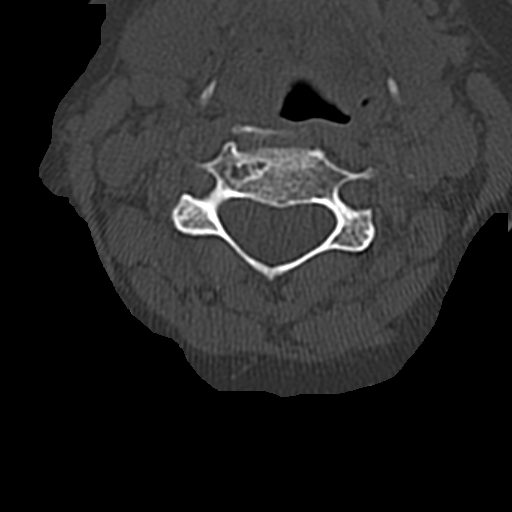
[im 70/100  brain]
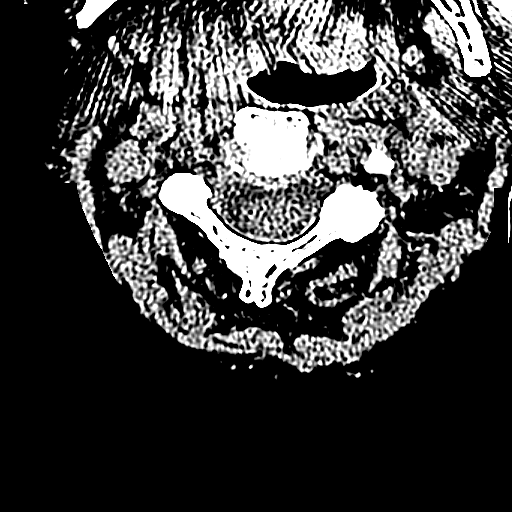
[im 80/100  brain]
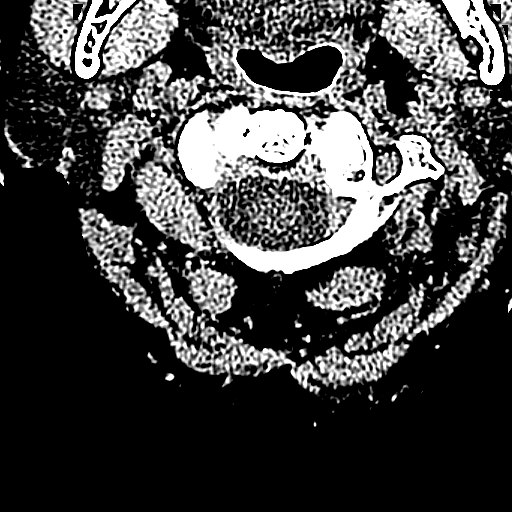
[im 90/100  brain]
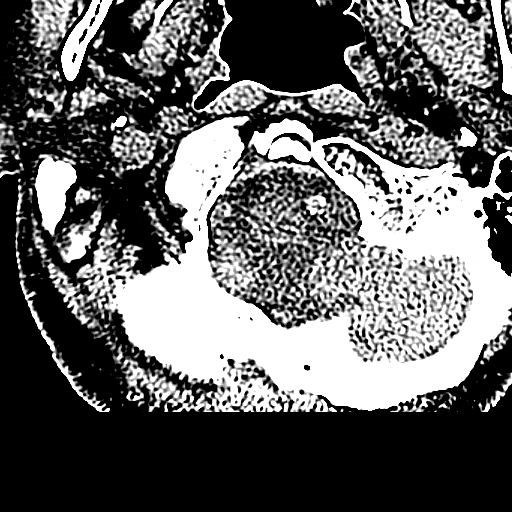

[Series 8: coronal · coronal · 0.25mm/px · 3 of 42 slices shown]
[im 14/42  brain]
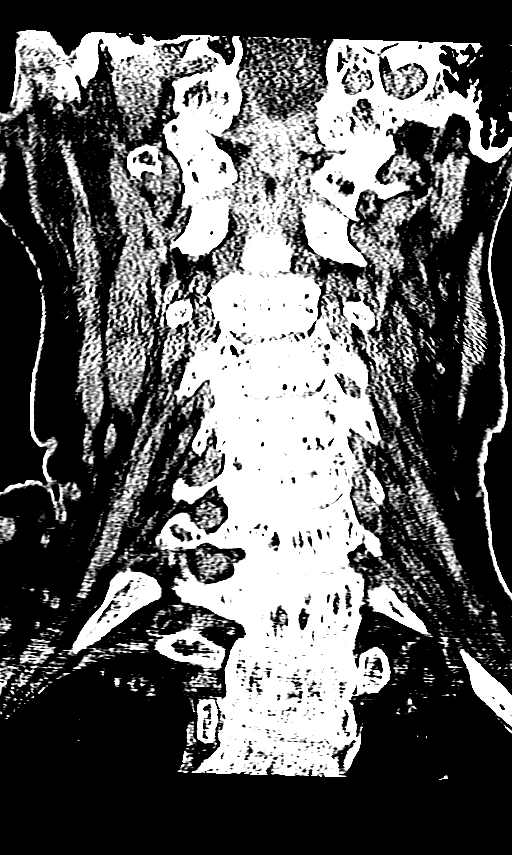
[im 19/42  brain]
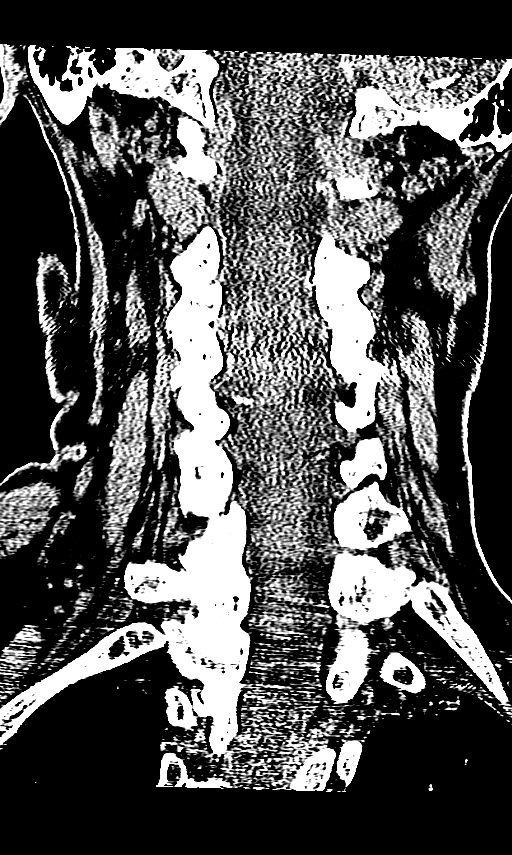
[im 23/42  brain]
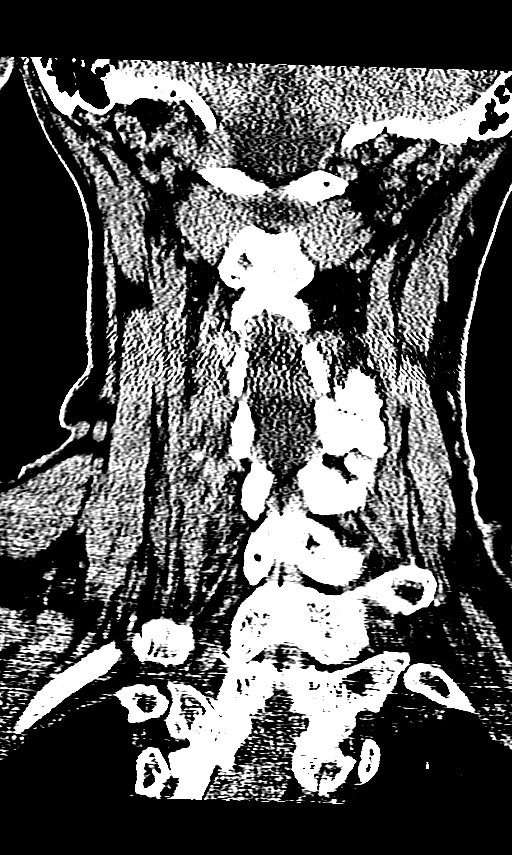

[Series 9: sagittal · sagittal · 0.32mm/px · 3 of 52 slices shown]
[im 18/52  brain]
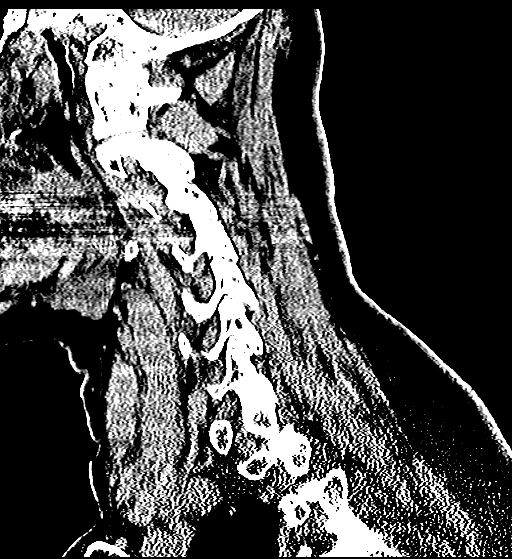
[im 26/52  brain]
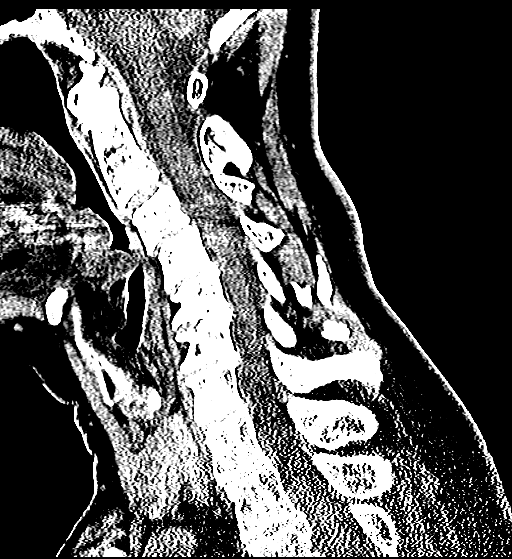
[im 35/52  brain]
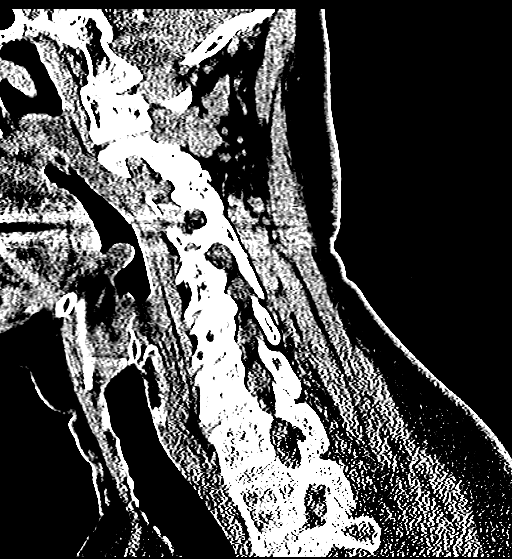

[14 of 47 positions shown; findings below may reference images not displayed]

FINDINGS: CT HEAD FINDINGS

There is some cortical atrophy and chronic microvascular ischemic
change. No acute intracranial abnormality including hemorrhage,
infarct, mass lesion, mass effect, midline shift or abnormal
extra-axial fluid collection is identified. No hydrocephalus or
pneumocephalus. The calvarium is intact. Imaged paranasal sinuses
and mastoid air cells are clear.

CT CERVICAL SPINE FINDINGS

No fracture is identified. There is reversal of the normal cervical
lordosis. Loss of disc space height and endplate spurring are worst
from C4-C6. Lung apices are clear.
IMPRESSION: No acute abnormality head or cervical spine.

Atrophy and chronic microvascular ischemic change.

Cervical spondylosis.

## 2016-04-26 IMAGING — CT CT ANGIO CHEST
2 of 6 series · 18 of 36 positions shown · IV contrast (OMNIPAQUE)
Comparison: None.

CLINICAL DATA: 88 yo female with history of hyperlipidemia,
depression, arthritis, prior history of syncope per daughter felt to
be vasovagal in nature presented to the ED with a syncopal episode.
Pt is now inpatient.

EXAM:
CT ANGIOGRAPHY CHEST WITH CONTRAST
TECHNIQUE: Multidetector CT imaging of the chest was performed using the
standard protocol during bolus administration of intravenous
contrast. Multiplanar CT image reconstructions and MIPs were
obtained to evaluate the vascular anatomy.
CONTRAST:  100mL OMNIPAQUE IOHEXOL 350 MG/ML SOLN

[Series 7: pe thins @ 1mm · axial · 0.63mm/px · z∈[-287,-46]mm · 17 of 269 slices shown]
[im 14/269  lung]
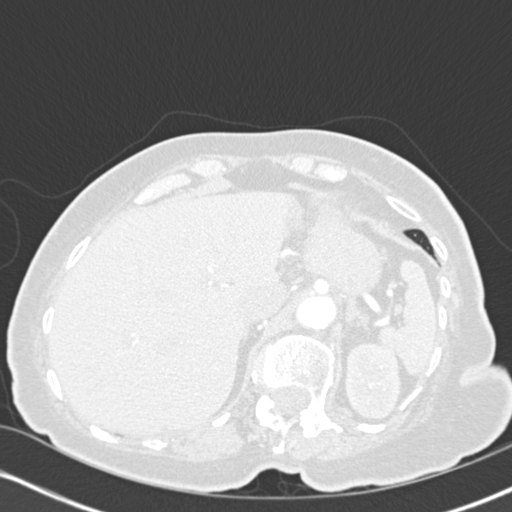
[im 27/269  mediastinal]
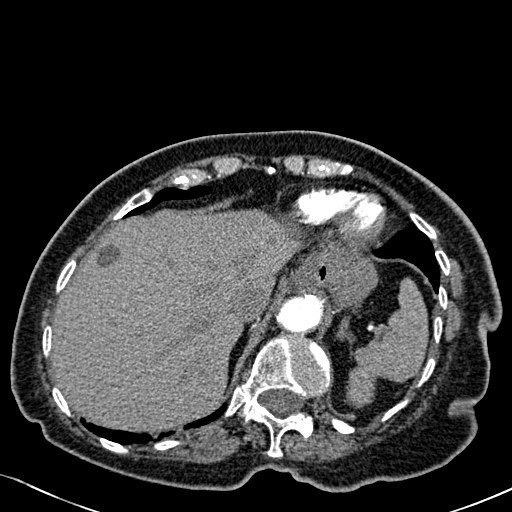
[im 41/269  lung]
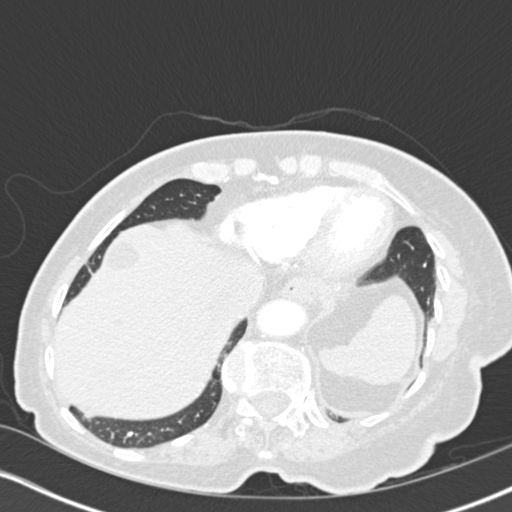
[im 54/269  mediastinal]
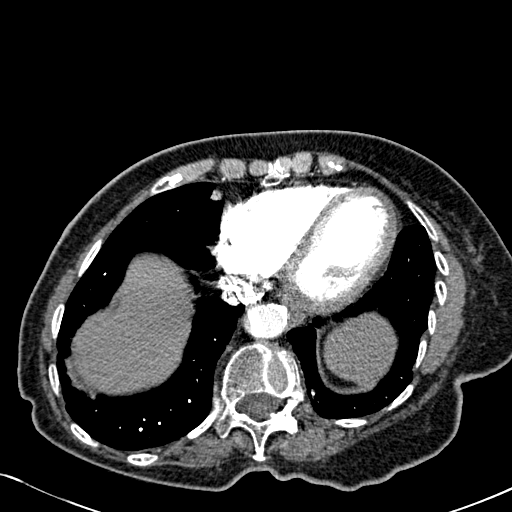
[im 81/269  lung]
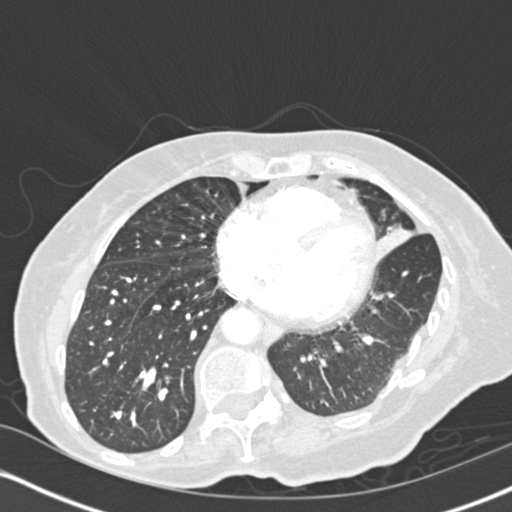
[im 94/269  mediastinal]
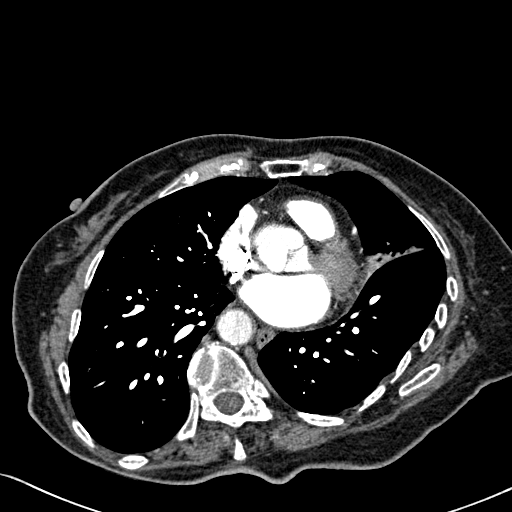
[im 108/269  lung]
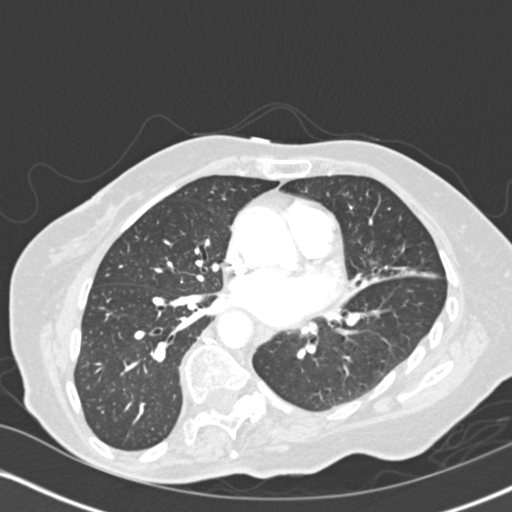
[im 121/269  mediastinal]
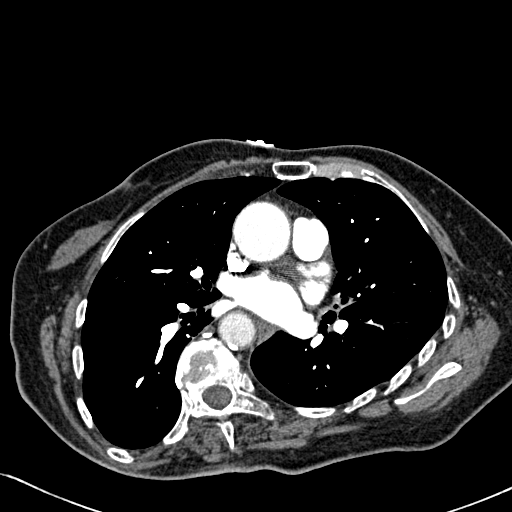
[im 135/269  lung]
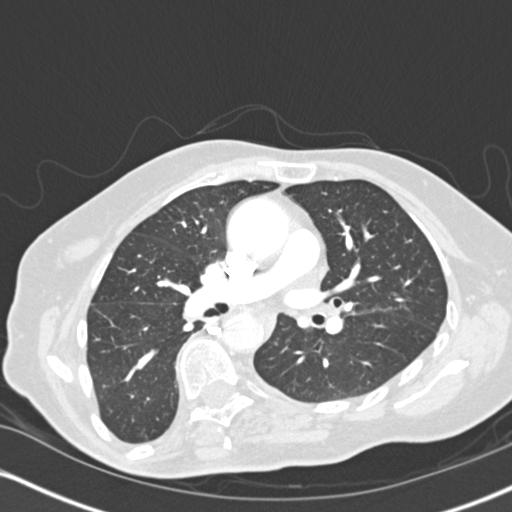
[im 148/269  mediastinal]
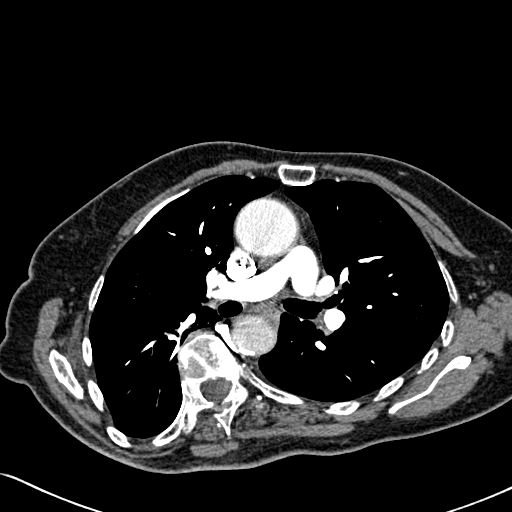
[im 161/269  lung]
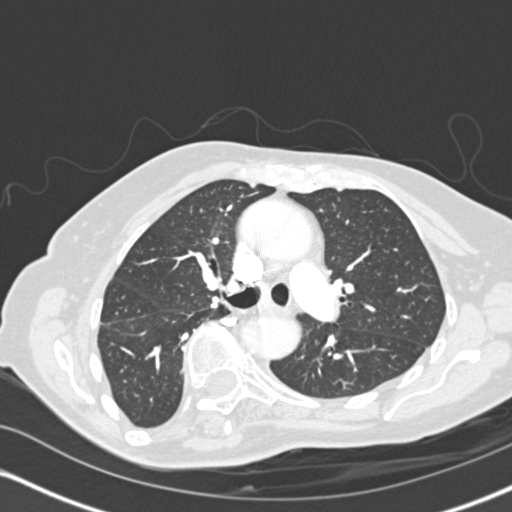
[im 175/269  mediastinal]
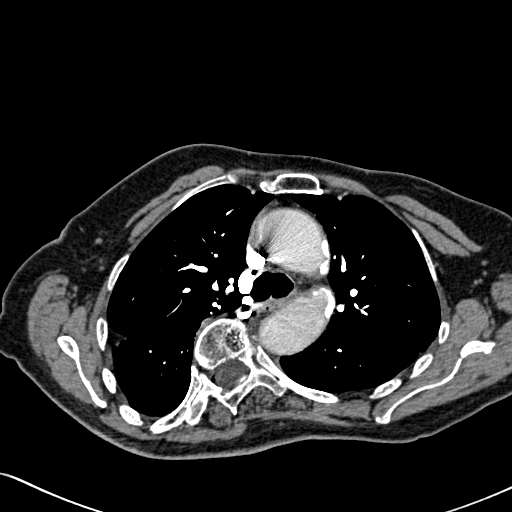
[im 188/269  lung]
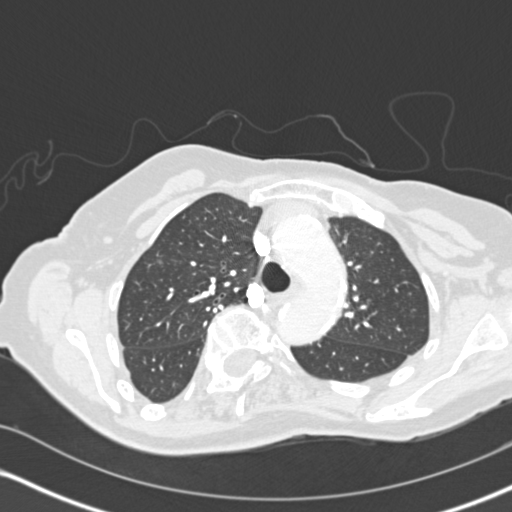
[im 215/269  mediastinal]
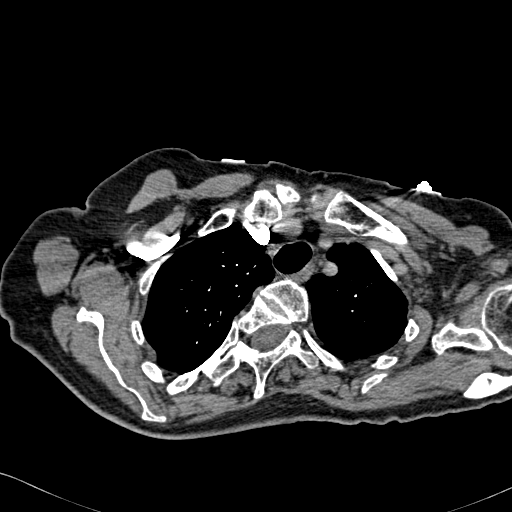
[im 228/269  lung]
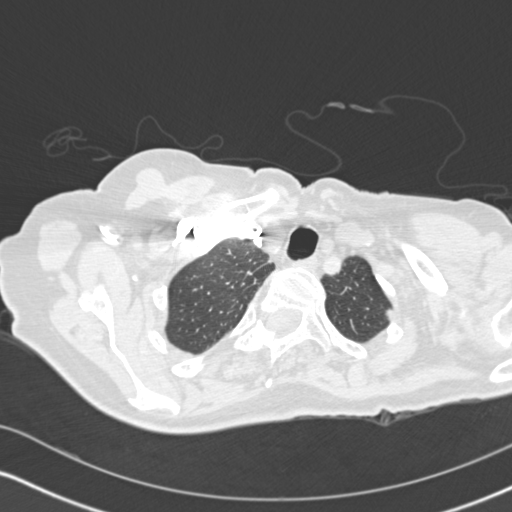
[im 242/269  mediastinal]
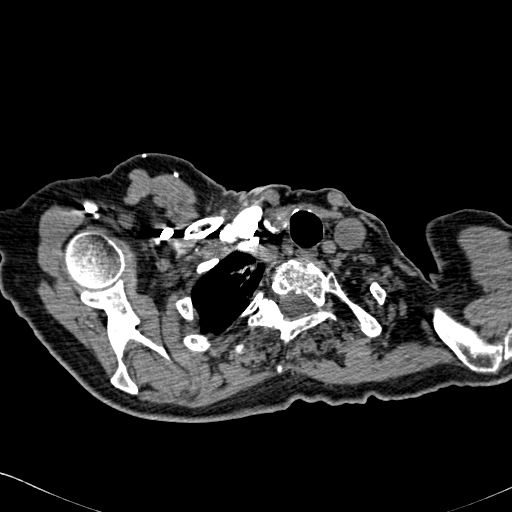
[im 255/269  lung]
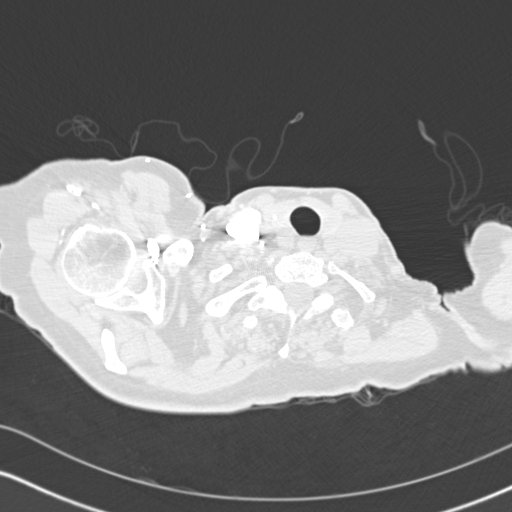

[Series 602: <mpr thick range> · coronal · 0.63mm/px · 1 of 103 slices shown]
[im 52/103  mediastinal]
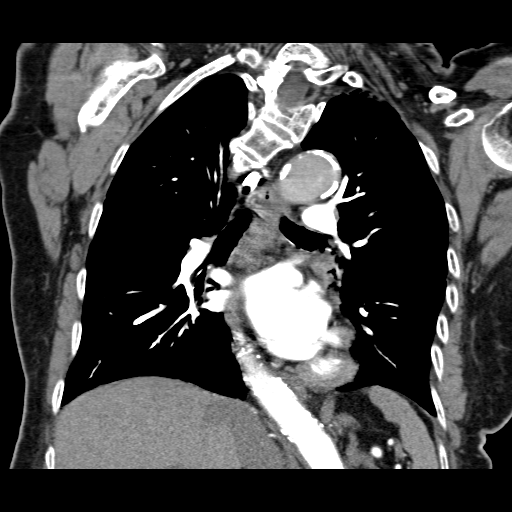

[18 of 36 positions shown; findings below may reference images not displayed]

FINDINGS: Technically adequate study with good opacification of the central
and segmental pulmonary arteries. No focal filling defects are
demonstrated. No evidence of significant pulmonary embolus.

Normal heart size. Tortuous and ectatic thoracic aorta. Ascending
thoracic aorta measures up to 0 3.8 cm diameter. Calcification of
the aorta. Calcification of coronary arteries. Great vessel origins
are patent. Esophagus is decompressed. No significant
lymphadenopathy in the chest.

Slight scarring in the lung apices. Left lingula demonstrates focal
consolidation with nodular peribronchial infiltrates suggesting
bronchopneumonia. Mild focal bronchiectasis. Small focal area of
nodular infiltration in the superior segment of the right lower lobe
may also represent bronchopneumonia. Scattered emphysematous changes
in the lungs. No pleural effusions. No pneumothorax.

Included portions of the upper abdominal organs demonstrate multiple
circumscribed low-attenuation lesions in the liver, likely to
represent cysts or hemangiomas. Thoracic scoliosis convexed to the
right. Degenerative changes in the spine. No destructive bone
lesions

Review of the MIP images confirms the above findings.
IMPRESSION: No evidence of significant pulmonary embolus. Patchy nodular
infiltrative changes in the lungs with focal small consolidation in
the left lingula probably representing bronchopneumonia. Tortuous
and dilated thoracic aorta with ascending aorta measuring 3.8 cm
diameter.

## 2016-04-26 IMAGING — DX DG CHEST 1V PORT
1 series · 1 of 1 positions shown · non-contrast
Comparison: 12/17/2013

CLINICAL DATA: Syncope.  Shortness of breath after fall.

EXAM:
PORTABLE CHEST 1 VIEW

[chest ap]
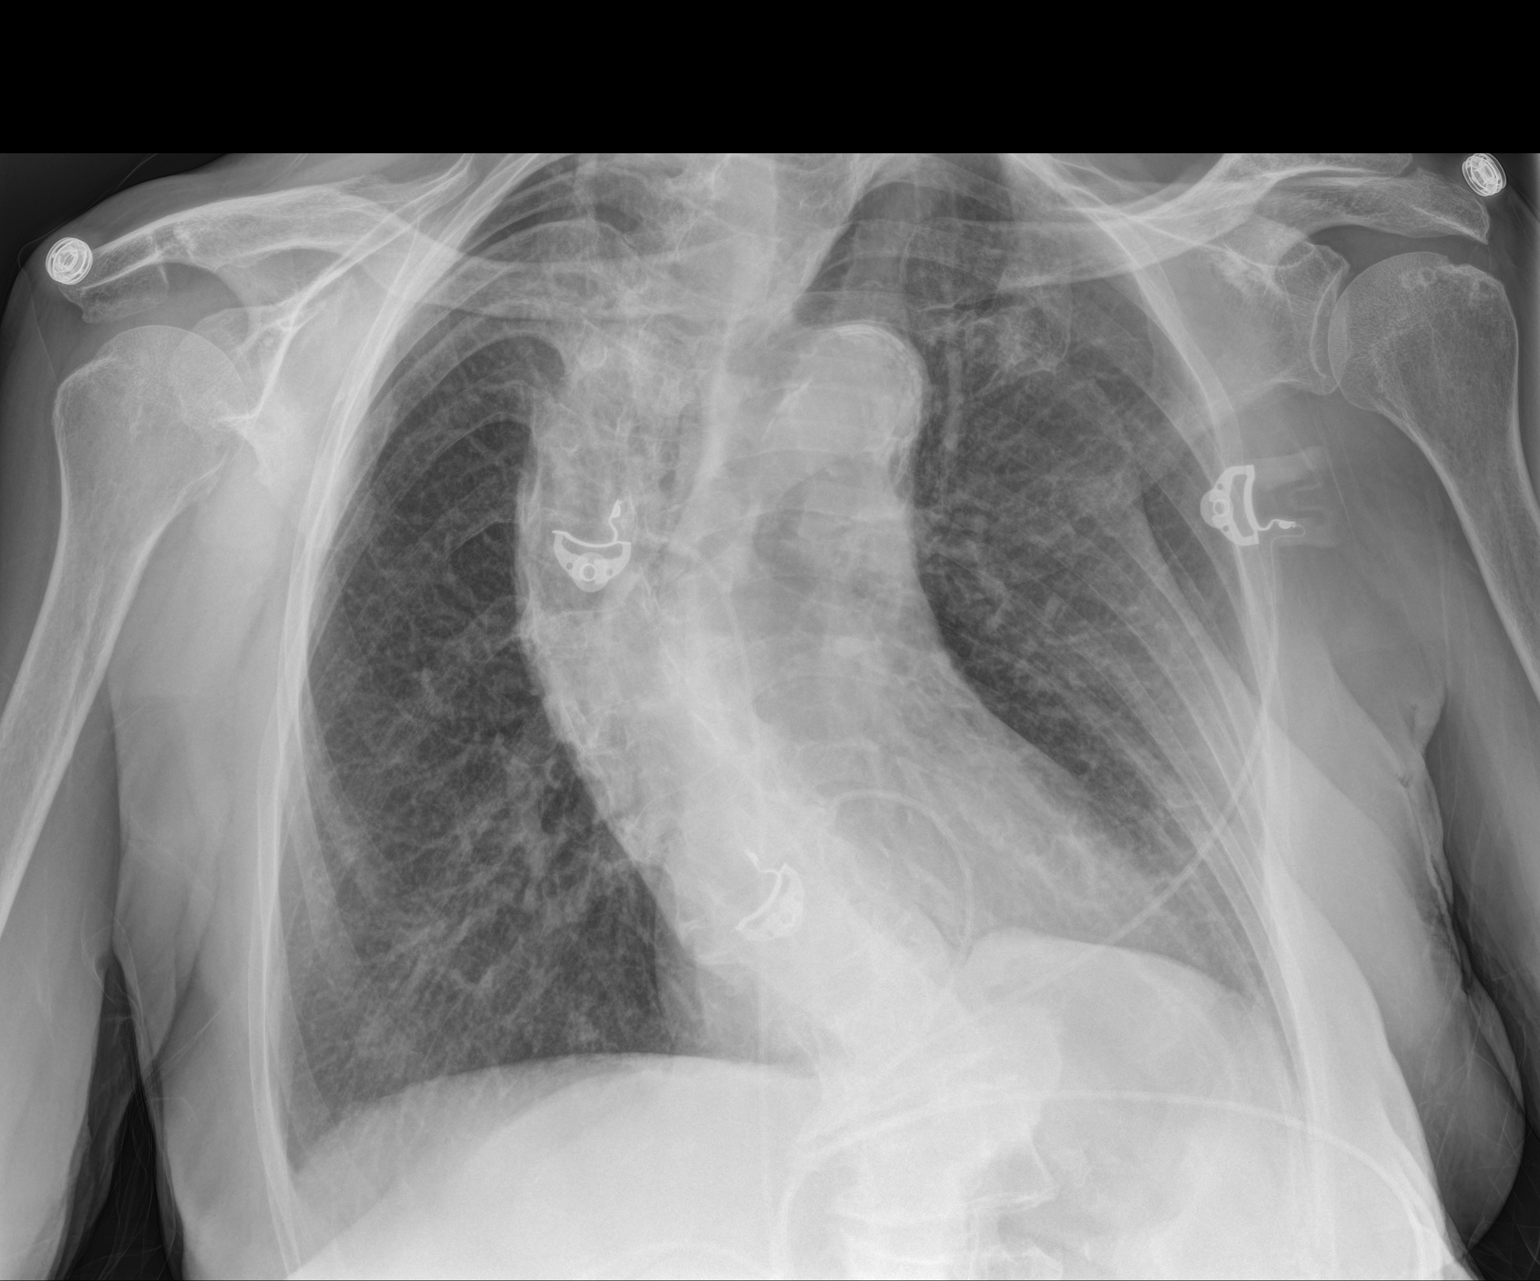

[1 of 1 positions shown; findings below may reference images not displayed]

FINDINGS: Severe thoracolumbar scoliosis. There is hyperinflation of the lungs
compatible with COPD. Heart is normal size. Lungs are clear. No
effusions or pneumothorax. No acute bony abnormality.
IMPRESSION: COPD.  No active disease.

## 2016-04-27 IMAGING — MR MR HEAD W/O CM
8 of 10 series · 38 of 48 positions shown · non-contrast
Comparison: Head CT and cervical spine CT 08/03/2015. Brain MRI
02/12/2015.

CLINICAL DATA: 88-year-old female with syncopal episode in the
shower. Hypotension. Initial encounter.

EXAM:
MRI HEAD WITHOUT CONTRAST
TECHNIQUE: Multiplanar, multiecho pulse sequences of the brain and surrounding
structures were obtained without intravenous contrast.

[Series 3: T1 · sagittal · 5.0mm · 0.47mm/px · 1 of 24 slices shown]
[im 1/24]
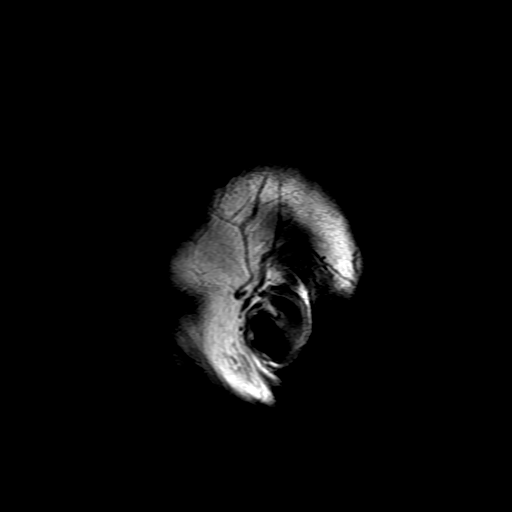

[Series 4: DWI · axial · 3.0mm · 1.09mm/px · z∈[-28,+125]mm · 11 of 104 slices shown (1 of 4)]
[im 1/104]
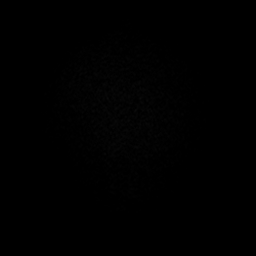
[im 11/104]
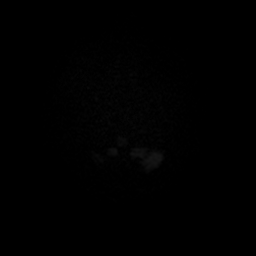
[im 21/104]
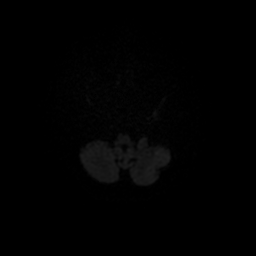
[im 31/104]
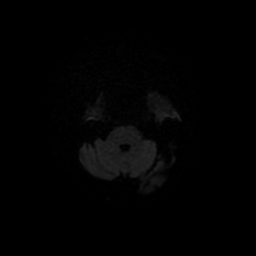
[im 42/104]
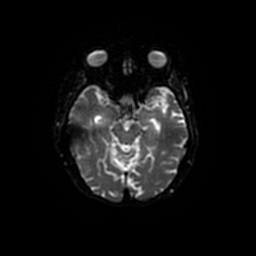
[im 52/104]
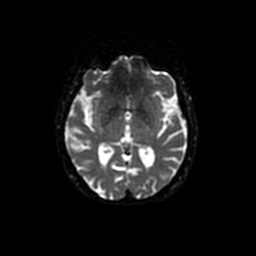
[im 62/104]
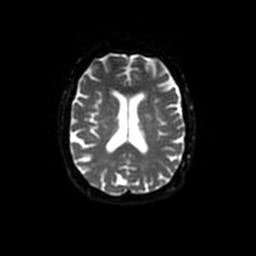
[im 73/104]
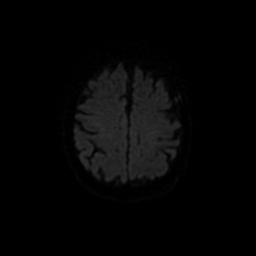
[im 83/104]
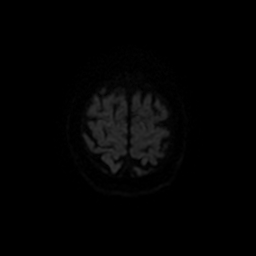
[im 93/104]
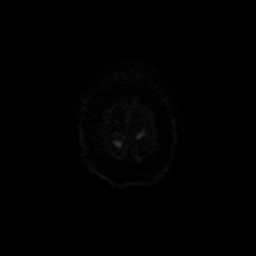
[im 104/104]
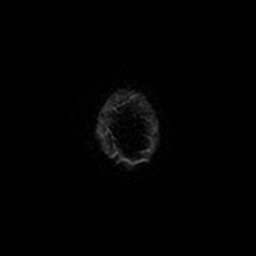

[Series 5: DWI · coronal · 5.0mm · 1.09mm/px · 7 of 66 slices shown (2 of 4)]
[im 1/66]
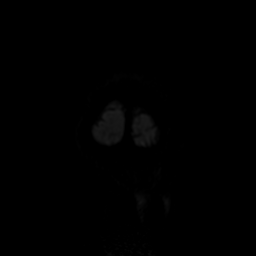
[im 11/66]
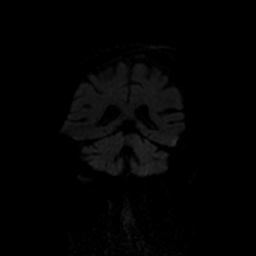
[im 22/66]
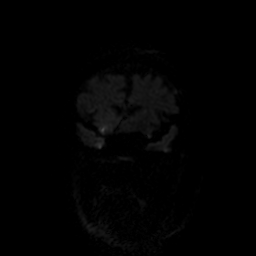
[im 33/66]
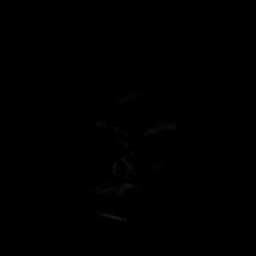
[im 44/66]
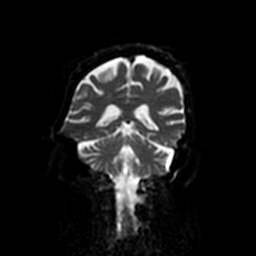
[im 55/66]
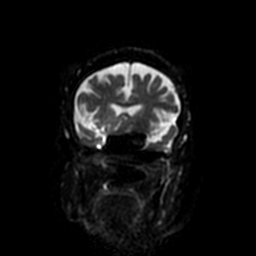
[im 66/66]
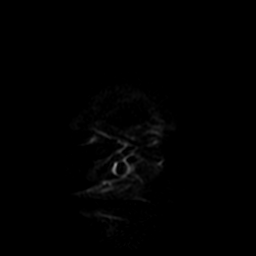

[Series 6: T2 · axial · 5.0mm · 0.43mm/px · z∈[-32,+137]mm · 3 of 27 slices shown (1 of 2)]
[im 1/27]
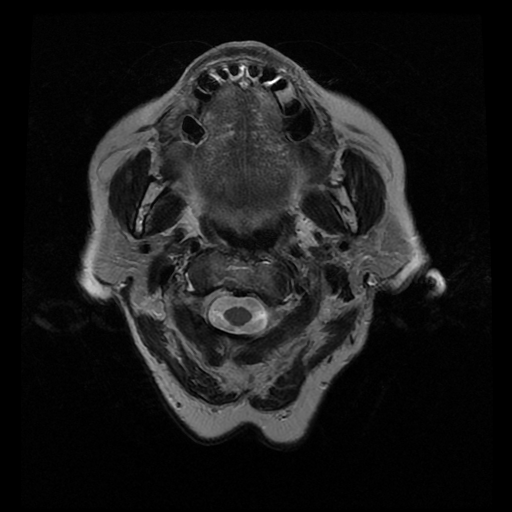
[im 14/27]
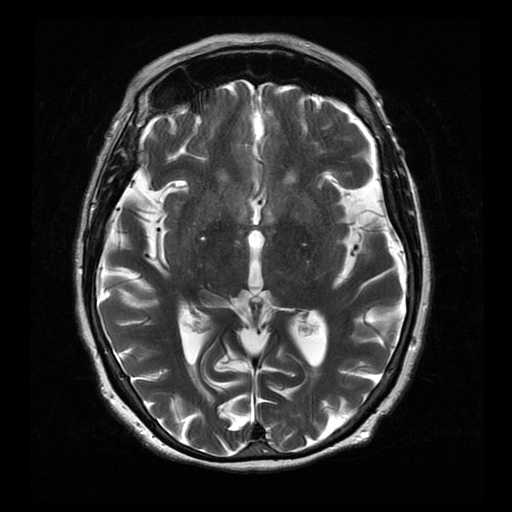
[im 27/27]
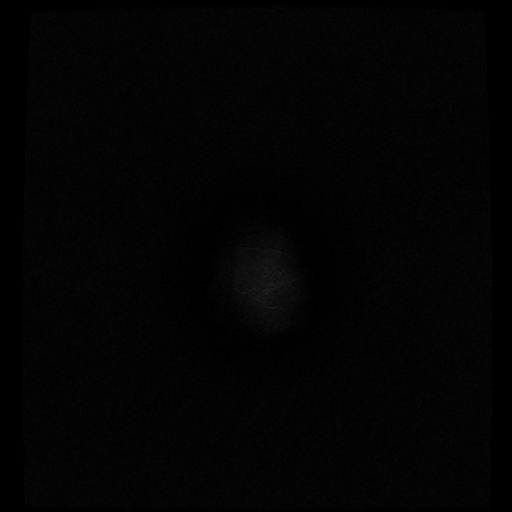

[Series 7: FLAIR · axial · 5.0mm · 0.43mm/px · z∈[-32,+137]mm · 3 of 27 slices shown]
[im 1/27]
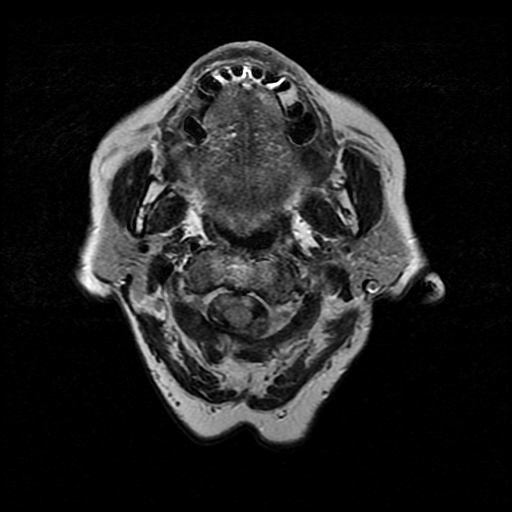
[im 14/27]
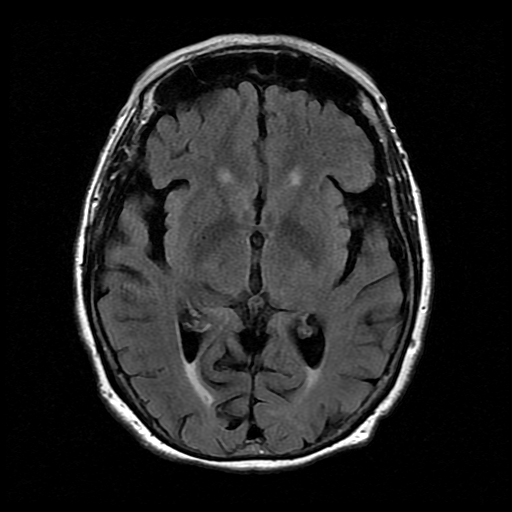
[im 27/27]
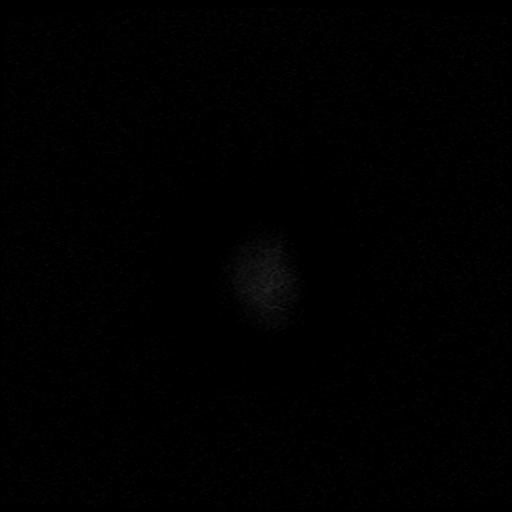

[Series 10: T2 · coronal · 5.0mm · 0.45mm/px · 3 of 25 slices shown (2 of 2)]
[im 1/25]
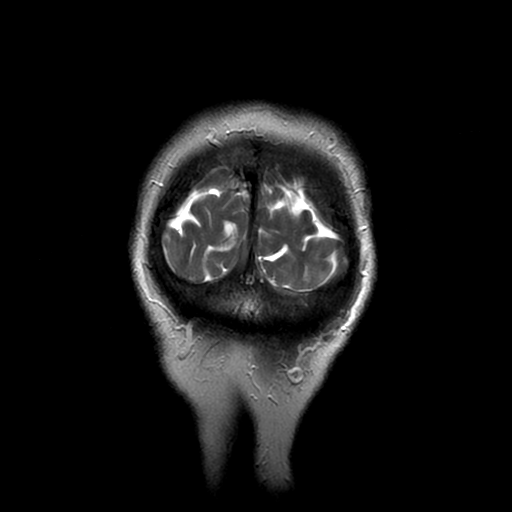
[im 13/25]
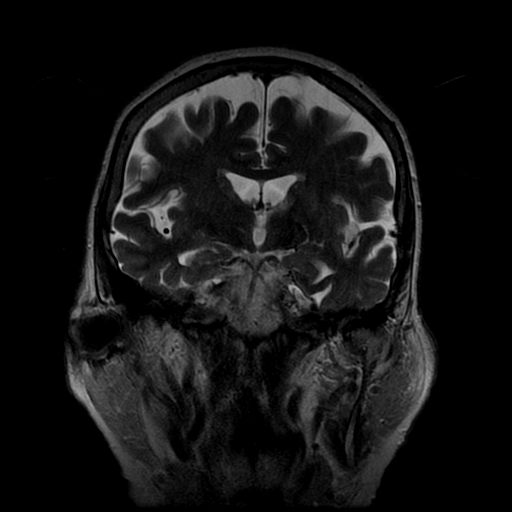
[im 25/25]
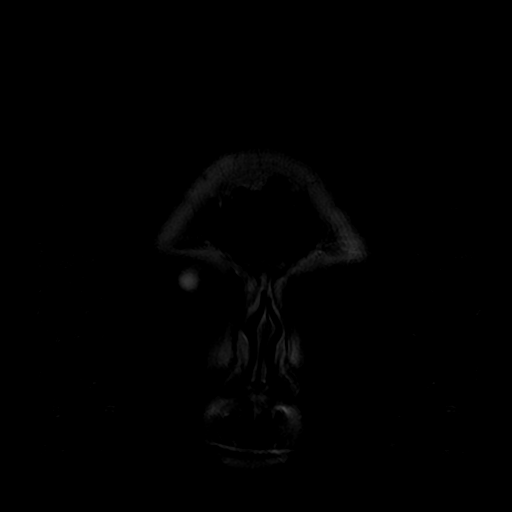

[Series 400: DWI · axial · 3.0mm · 1.09mm/px · z∈[-28,+125]mm · 6 of 52 slices shown (3 of 4)]
[im 1/52]
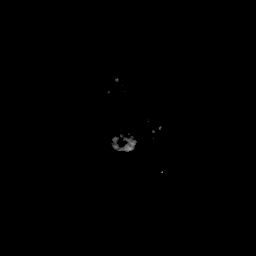
[im 11/52]
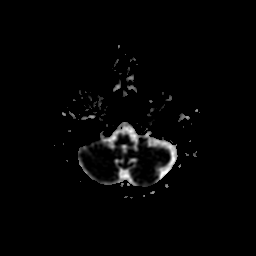
[im 21/52]
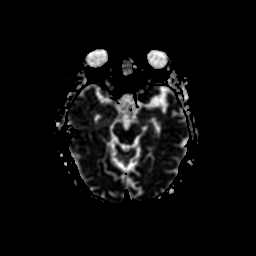
[im 31/52]
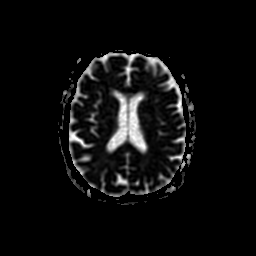
[im 41/52]
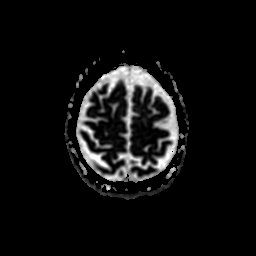
[im 52/52]
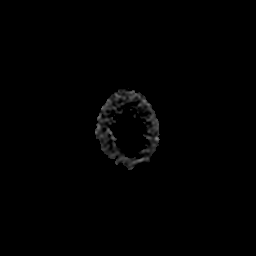

[Series 500: DWI · coronal · 5.0mm · 1.09mm/px · 4 of 33 slices shown (4 of 4)]
[im 1/33]
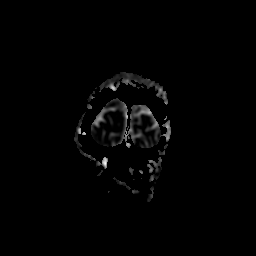
[im 11/33]
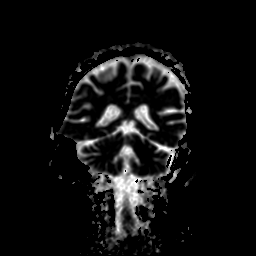
[im 22/33]
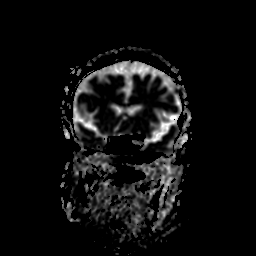
[im 33/33]
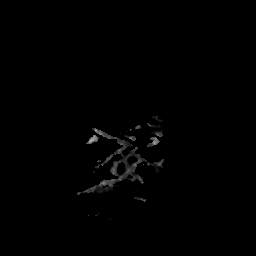

[38 of 48 positions shown; findings below may reference images not displayed]

FINDINGS: Major intracranial vascular flow voids are stable. Pneumatized
anterior clinoid processes incidentally noted. Stable cerebral
volume. No restricted diffusion to suggest acute infarction. No
midline shift, mass effect, evidence of mass lesion,
ventriculomegaly, extra-axial collection or acute intracranial
hemorrhage. Cervicomedullary junction and pituitary are within
normal limits. Stable gray and white matter signal throughout the
brain. No cortical encephalomalacia or definite chronic cerebral
blood products. Deep gray matter nuclei brainstem and cerebellum
remain within normal limits for age.

Visible internal auditory structures appear normal. Mastoids and
paranasal sinuses are clear. Orbit and scalp soft tissues are
stable. Negative visualized cervical spine. Normal bone marrow
signal.
IMPRESSION: No acute intracranial abnormality. Stable noncontrast MRI appearance
of the brain since [DATE].

## 2016-05-19 DIAGNOSIS — H35432 Paving stone degeneration of retina, left eye: Secondary | ICD-10-CM | POA: Diagnosis not present

## 2016-05-19 DIAGNOSIS — H35341 Macular cyst, hole, or pseudohole, right eye: Secondary | ICD-10-CM | POA: Diagnosis not present

## 2016-05-19 DIAGNOSIS — H35372 Puckering of macula, left eye: Secondary | ICD-10-CM | POA: Diagnosis not present

## 2016-05-19 DIAGNOSIS — H353232 Exudative age-related macular degeneration, bilateral, with inactive choroidal neovascularization: Secondary | ICD-10-CM | POA: Diagnosis not present

## 2016-05-28 DIAGNOSIS — R488 Other symbolic dysfunctions: Secondary | ICD-10-CM | POA: Diagnosis not present

## 2016-05-28 DIAGNOSIS — M6281 Muscle weakness (generalized): Secondary | ICD-10-CM | POA: Diagnosis not present

## 2016-07-21 DIAGNOSIS — N3946 Mixed incontinence: Secondary | ICD-10-CM | POA: Diagnosis not present

## 2016-07-21 DIAGNOSIS — R488 Other symbolic dysfunctions: Secondary | ICD-10-CM | POA: Diagnosis not present

## 2016-07-21 DIAGNOSIS — R41841 Cognitive communication deficit: Secondary | ICD-10-CM | POA: Diagnosis not present

## 2016-07-22 DIAGNOSIS — R488 Other symbolic dysfunctions: Secondary | ICD-10-CM | POA: Diagnosis not present

## 2016-07-22 DIAGNOSIS — N3946 Mixed incontinence: Secondary | ICD-10-CM | POA: Diagnosis not present

## 2016-07-22 DIAGNOSIS — Z23 Encounter for immunization: Secondary | ICD-10-CM | POA: Diagnosis not present

## 2016-07-22 DIAGNOSIS — R41841 Cognitive communication deficit: Secondary | ICD-10-CM | POA: Diagnosis not present

## 2016-07-29 DIAGNOSIS — R488 Other symbolic dysfunctions: Secondary | ICD-10-CM | POA: Diagnosis not present

## 2016-07-29 DIAGNOSIS — N3946 Mixed incontinence: Secondary | ICD-10-CM | POA: Diagnosis not present

## 2016-07-29 DIAGNOSIS — R41841 Cognitive communication deficit: Secondary | ICD-10-CM | POA: Diagnosis not present

## 2016-07-30 DIAGNOSIS — R41841 Cognitive communication deficit: Secondary | ICD-10-CM | POA: Diagnosis not present

## 2016-07-30 DIAGNOSIS — N3946 Mixed incontinence: Secondary | ICD-10-CM | POA: Diagnosis not present

## 2016-07-30 DIAGNOSIS — R488 Other symbolic dysfunctions: Secondary | ICD-10-CM | POA: Diagnosis not present

## 2016-07-31 DIAGNOSIS — R41841 Cognitive communication deficit: Secondary | ICD-10-CM | POA: Diagnosis not present

## 2016-07-31 DIAGNOSIS — R488 Other symbolic dysfunctions: Secondary | ICD-10-CM | POA: Diagnosis not present

## 2016-07-31 DIAGNOSIS — N3946 Mixed incontinence: Secondary | ICD-10-CM | POA: Diagnosis not present

## 2016-08-01 DIAGNOSIS — R488 Other symbolic dysfunctions: Secondary | ICD-10-CM | POA: Diagnosis not present

## 2016-08-01 DIAGNOSIS — N3946 Mixed incontinence: Secondary | ICD-10-CM | POA: Diagnosis not present

## 2016-08-01 DIAGNOSIS — R41841 Cognitive communication deficit: Secondary | ICD-10-CM | POA: Diagnosis not present

## 2016-08-04 DIAGNOSIS — R488 Other symbolic dysfunctions: Secondary | ICD-10-CM | POA: Diagnosis not present

## 2016-08-04 DIAGNOSIS — N3946 Mixed incontinence: Secondary | ICD-10-CM | POA: Diagnosis not present

## 2016-08-04 DIAGNOSIS — R41841 Cognitive communication deficit: Secondary | ICD-10-CM | POA: Diagnosis not present

## 2016-08-05 DIAGNOSIS — R41841 Cognitive communication deficit: Secondary | ICD-10-CM | POA: Diagnosis not present

## 2016-08-05 DIAGNOSIS — R488 Other symbolic dysfunctions: Secondary | ICD-10-CM | POA: Diagnosis not present

## 2016-08-05 DIAGNOSIS — N3946 Mixed incontinence: Secondary | ICD-10-CM | POA: Diagnosis not present

## 2016-08-06 DIAGNOSIS — G301 Alzheimer's disease with late onset: Secondary | ICD-10-CM | POA: Diagnosis not present

## 2016-08-06 DIAGNOSIS — N3946 Mixed incontinence: Secondary | ICD-10-CM | POA: Diagnosis not present

## 2016-08-06 DIAGNOSIS — Z23 Encounter for immunization: Secondary | ICD-10-CM | POA: Diagnosis not present

## 2016-08-06 DIAGNOSIS — F325 Major depressive disorder, single episode, in full remission: Secondary | ICD-10-CM | POA: Diagnosis not present

## 2016-08-06 DIAGNOSIS — R41841 Cognitive communication deficit: Secondary | ICD-10-CM | POA: Diagnosis not present

## 2016-08-06 DIAGNOSIS — M810 Age-related osteoporosis without current pathological fracture: Secondary | ICD-10-CM | POA: Diagnosis not present

## 2016-08-06 DIAGNOSIS — R32 Unspecified urinary incontinence: Secondary | ICD-10-CM | POA: Diagnosis not present

## 2016-08-06 DIAGNOSIS — F0280 Dementia in other diseases classified elsewhere without behavioral disturbance: Secondary | ICD-10-CM | POA: Diagnosis not present

## 2016-08-06 DIAGNOSIS — F3340 Major depressive disorder, recurrent, in remission, unspecified: Secondary | ICD-10-CM | POA: Diagnosis not present

## 2016-08-06 DIAGNOSIS — E782 Mixed hyperlipidemia: Secondary | ICD-10-CM | POA: Diagnosis not present

## 2016-08-06 DIAGNOSIS — R488 Other symbolic dysfunctions: Secondary | ICD-10-CM | POA: Diagnosis not present

## 2016-08-06 DIAGNOSIS — R7301 Impaired fasting glucose: Secondary | ICD-10-CM | POA: Diagnosis not present

## 2016-08-07 DIAGNOSIS — N3946 Mixed incontinence: Secondary | ICD-10-CM | POA: Diagnosis not present

## 2016-08-07 DIAGNOSIS — R488 Other symbolic dysfunctions: Secondary | ICD-10-CM | POA: Diagnosis not present

## 2016-08-07 DIAGNOSIS — R41841 Cognitive communication deficit: Secondary | ICD-10-CM | POA: Diagnosis not present

## 2016-08-08 DIAGNOSIS — R488 Other symbolic dysfunctions: Secondary | ICD-10-CM | POA: Diagnosis not present

## 2016-08-08 DIAGNOSIS — N3946 Mixed incontinence: Secondary | ICD-10-CM | POA: Diagnosis not present

## 2016-08-08 DIAGNOSIS — R41841 Cognitive communication deficit: Secondary | ICD-10-CM | POA: Diagnosis not present

## 2016-08-11 DIAGNOSIS — H35341 Macular cyst, hole, or pseudohole, right eye: Secondary | ICD-10-CM | POA: Diagnosis not present

## 2016-08-11 DIAGNOSIS — R488 Other symbolic dysfunctions: Secondary | ICD-10-CM | POA: Diagnosis not present

## 2016-08-11 DIAGNOSIS — N3946 Mixed incontinence: Secondary | ICD-10-CM | POA: Diagnosis not present

## 2016-08-11 DIAGNOSIS — H353232 Exudative age-related macular degeneration, bilateral, with inactive choroidal neovascularization: Secondary | ICD-10-CM | POA: Diagnosis not present

## 2016-08-11 DIAGNOSIS — R41841 Cognitive communication deficit: Secondary | ICD-10-CM | POA: Diagnosis not present

## 2016-08-12 DIAGNOSIS — N3946 Mixed incontinence: Secondary | ICD-10-CM | POA: Diagnosis not present

## 2016-08-12 DIAGNOSIS — R488 Other symbolic dysfunctions: Secondary | ICD-10-CM | POA: Diagnosis not present

## 2016-08-12 DIAGNOSIS — R41841 Cognitive communication deficit: Secondary | ICD-10-CM | POA: Diagnosis not present

## 2016-08-13 DIAGNOSIS — R488 Other symbolic dysfunctions: Secondary | ICD-10-CM | POA: Diagnosis not present

## 2016-08-13 DIAGNOSIS — R41841 Cognitive communication deficit: Secondary | ICD-10-CM | POA: Diagnosis not present

## 2016-08-13 DIAGNOSIS — N3946 Mixed incontinence: Secondary | ICD-10-CM | POA: Diagnosis not present

## 2016-08-14 DIAGNOSIS — R488 Other symbolic dysfunctions: Secondary | ICD-10-CM | POA: Diagnosis not present

## 2016-08-14 DIAGNOSIS — R41841 Cognitive communication deficit: Secondary | ICD-10-CM | POA: Diagnosis not present

## 2016-08-14 DIAGNOSIS — N3946 Mixed incontinence: Secondary | ICD-10-CM | POA: Diagnosis not present

## 2016-08-15 DIAGNOSIS — N3946 Mixed incontinence: Secondary | ICD-10-CM | POA: Diagnosis not present

## 2016-08-15 DIAGNOSIS — R41841 Cognitive communication deficit: Secondary | ICD-10-CM | POA: Diagnosis not present

## 2016-08-15 DIAGNOSIS — R488 Other symbolic dysfunctions: Secondary | ICD-10-CM | POA: Diagnosis not present

## 2016-08-18 DIAGNOSIS — H43813 Vitreous degeneration, bilateral: Secondary | ICD-10-CM | POA: Diagnosis not present

## 2016-08-18 DIAGNOSIS — H401132 Primary open-angle glaucoma, bilateral, moderate stage: Secondary | ICD-10-CM | POA: Diagnosis not present

## 2016-08-18 DIAGNOSIS — R488 Other symbolic dysfunctions: Secondary | ICD-10-CM | POA: Diagnosis not present

## 2016-08-18 DIAGNOSIS — H524 Presbyopia: Secondary | ICD-10-CM | POA: Diagnosis not present

## 2016-08-18 DIAGNOSIS — M2042 Other hammer toe(s) (acquired), left foot: Secondary | ICD-10-CM | POA: Diagnosis not present

## 2016-08-18 DIAGNOSIS — L602 Onychogryphosis: Secondary | ICD-10-CM | POA: Diagnosis not present

## 2016-08-18 DIAGNOSIS — N3946 Mixed incontinence: Secondary | ICD-10-CM | POA: Diagnosis not present

## 2016-08-18 DIAGNOSIS — H353232 Exudative age-related macular degeneration, bilateral, with inactive choroidal neovascularization: Secondary | ICD-10-CM | POA: Diagnosis not present

## 2016-08-18 DIAGNOSIS — R41841 Cognitive communication deficit: Secondary | ICD-10-CM | POA: Diagnosis not present

## 2016-08-18 DIAGNOSIS — M2041 Other hammer toe(s) (acquired), right foot: Secondary | ICD-10-CM | POA: Diagnosis not present

## 2016-08-18 DIAGNOSIS — Z961 Presence of intraocular lens: Secondary | ICD-10-CM | POA: Diagnosis not present

## 2016-08-20 DIAGNOSIS — R41841 Cognitive communication deficit: Secondary | ICD-10-CM | POA: Diagnosis not present

## 2016-08-20 DIAGNOSIS — R488 Other symbolic dysfunctions: Secondary | ICD-10-CM | POA: Diagnosis not present

## 2016-08-20 DIAGNOSIS — N3946 Mixed incontinence: Secondary | ICD-10-CM | POA: Diagnosis not present

## 2016-08-22 DIAGNOSIS — R488 Other symbolic dysfunctions: Secondary | ICD-10-CM | POA: Diagnosis not present

## 2016-08-22 DIAGNOSIS — N3946 Mixed incontinence: Secondary | ICD-10-CM | POA: Diagnosis not present

## 2016-08-22 DIAGNOSIS — R41841 Cognitive communication deficit: Secondary | ICD-10-CM | POA: Diagnosis not present

## 2016-08-25 DIAGNOSIS — N3946 Mixed incontinence: Secondary | ICD-10-CM | POA: Diagnosis not present

## 2016-08-25 DIAGNOSIS — R41841 Cognitive communication deficit: Secondary | ICD-10-CM | POA: Diagnosis not present

## 2016-08-25 DIAGNOSIS — R488 Other symbolic dysfunctions: Secondary | ICD-10-CM | POA: Diagnosis not present

## 2016-08-26 DIAGNOSIS — N3946 Mixed incontinence: Secondary | ICD-10-CM | POA: Diagnosis not present

## 2016-08-26 DIAGNOSIS — R41841 Cognitive communication deficit: Secondary | ICD-10-CM | POA: Diagnosis not present

## 2016-08-26 DIAGNOSIS — R488 Other symbolic dysfunctions: Secondary | ICD-10-CM | POA: Diagnosis not present

## 2016-08-27 DIAGNOSIS — N3946 Mixed incontinence: Secondary | ICD-10-CM | POA: Diagnosis not present

## 2016-08-27 DIAGNOSIS — R41841 Cognitive communication deficit: Secondary | ICD-10-CM | POA: Diagnosis not present

## 2016-08-27 DIAGNOSIS — R488 Other symbolic dysfunctions: Secondary | ICD-10-CM | POA: Diagnosis not present

## 2016-08-28 DIAGNOSIS — R41841 Cognitive communication deficit: Secondary | ICD-10-CM | POA: Diagnosis not present

## 2016-08-28 DIAGNOSIS — N3946 Mixed incontinence: Secondary | ICD-10-CM | POA: Diagnosis not present

## 2016-08-28 DIAGNOSIS — R488 Other symbolic dysfunctions: Secondary | ICD-10-CM | POA: Diagnosis not present

## 2016-08-29 DIAGNOSIS — N3946 Mixed incontinence: Secondary | ICD-10-CM | POA: Diagnosis not present

## 2016-08-29 DIAGNOSIS — R41841 Cognitive communication deficit: Secondary | ICD-10-CM | POA: Diagnosis not present

## 2016-08-29 DIAGNOSIS — R488 Other symbolic dysfunctions: Secondary | ICD-10-CM | POA: Diagnosis not present

## 2016-08-30 ENCOUNTER — Emergency Department (HOSPITAL_COMMUNITY): Payer: Medicare Other

## 2016-08-30 ENCOUNTER — Encounter (HOSPITAL_COMMUNITY): Payer: Self-pay

## 2016-08-30 ENCOUNTER — Inpatient Hospital Stay (HOSPITAL_COMMUNITY)
Admission: EM | Admit: 2016-08-30 | Discharge: 2016-09-02 | DRG: 470 | Disposition: A | Discharge status: Skilled Nursing Facility | Payer: Medicare Other | Attending: Family Medicine | Admitting: Family Medicine

## 2016-08-30 DIAGNOSIS — I340 Nonrheumatic mitral (valve) insufficiency: Secondary | ICD-10-CM | POA: Diagnosis present

## 2016-08-30 DIAGNOSIS — R52 Pain, unspecified: Secondary | ICD-10-CM | POA: Diagnosis not present

## 2016-08-30 DIAGNOSIS — M150 Primary generalized (osteo)arthritis: Secondary | ICD-10-CM | POA: Diagnosis not present

## 2016-08-30 DIAGNOSIS — Z96641 Presence of right artificial hip joint: Secondary | ICD-10-CM | POA: Diagnosis not present

## 2016-08-30 DIAGNOSIS — M810 Age-related osteoporosis without current pathological fracture: Secondary | ICD-10-CM

## 2016-08-30 DIAGNOSIS — Z9114 Patient's other noncompliance with medication regimen: Secondary | ICD-10-CM

## 2016-08-30 DIAGNOSIS — Z90710 Acquired absence of both cervix and uterus: Secondary | ICD-10-CM

## 2016-08-30 DIAGNOSIS — I080 Rheumatic disorders of both mitral and aortic valves: Secondary | ICD-10-CM | POA: Diagnosis present

## 2016-08-30 DIAGNOSIS — S72002D Fracture of unspecified part of neck of left femur, subsequent encounter for closed fracture with routine healing: Secondary | ICD-10-CM | POA: Diagnosis not present

## 2016-08-30 DIAGNOSIS — Z87891 Personal history of nicotine dependence: Secondary | ICD-10-CM | POA: Diagnosis not present

## 2016-08-30 DIAGNOSIS — Y9301 Activity, walking, marching and hiking: Secondary | ICD-10-CM | POA: Diagnosis present

## 2016-08-30 DIAGNOSIS — E785 Hyperlipidemia, unspecified: Secondary | ICD-10-CM | POA: Diagnosis not present

## 2016-08-30 DIAGNOSIS — E7800 Pure hypercholesterolemia, unspecified: Secondary | ICD-10-CM | POA: Diagnosis present

## 2016-08-30 DIAGNOSIS — S72001D Fracture of unspecified part of neck of right femur, subsequent encounter for closed fracture with routine healing: Secondary | ICD-10-CM | POA: Diagnosis not present

## 2016-08-30 DIAGNOSIS — Z96651 Presence of right artificial knee joint: Secondary | ICD-10-CM | POA: Diagnosis present

## 2016-08-30 DIAGNOSIS — F0150 Vascular dementia without behavioral disturbance: Secondary | ICD-10-CM | POA: Diagnosis not present

## 2016-08-30 DIAGNOSIS — S728X9A Other fracture of unspecified femur, initial encounter for closed fracture: Secondary | ICD-10-CM | POA: Diagnosis not present

## 2016-08-30 DIAGNOSIS — R55 Syncope and collapse: Secondary | ICD-10-CM

## 2016-08-30 DIAGNOSIS — N390 Urinary tract infection, site not specified: Secondary | ICD-10-CM

## 2016-08-30 DIAGNOSIS — Y92410 Unspecified street and highway as the place of occurrence of the external cause: Secondary | ICD-10-CM

## 2016-08-30 DIAGNOSIS — R531 Weakness: Secondary | ICD-10-CM | POA: Diagnosis not present

## 2016-08-30 DIAGNOSIS — Z7982 Long term (current) use of aspirin: Secondary | ICD-10-CM | POA: Diagnosis not present

## 2016-08-30 DIAGNOSIS — S72092A Other fracture of head and neck of left femur, initial encounter for closed fracture: Secondary | ICD-10-CM | POA: Diagnosis not present

## 2016-08-30 DIAGNOSIS — R262 Difficulty in walking, not elsewhere classified: Secondary | ICD-10-CM | POA: Diagnosis not present

## 2016-08-30 DIAGNOSIS — Z96642 Presence of left artificial hip joint: Secondary | ICD-10-CM | POA: Diagnosis not present

## 2016-08-30 DIAGNOSIS — Z66 Do not resuscitate: Secondary | ICD-10-CM | POA: Diagnosis present

## 2016-08-30 DIAGNOSIS — B373 Candidiasis of vulva and vagina: Secondary | ICD-10-CM | POA: Diagnosis not present

## 2016-08-30 DIAGNOSIS — Z9181 History of falling: Secondary | ICD-10-CM | POA: Diagnosis not present

## 2016-08-30 DIAGNOSIS — Z9889 Other specified postprocedural states: Secondary | ICD-10-CM

## 2016-08-30 DIAGNOSIS — R278 Other lack of coordination: Secondary | ICD-10-CM | POA: Diagnosis not present

## 2016-08-30 DIAGNOSIS — R41841 Cognitive communication deficit: Secondary | ICD-10-CM | POA: Diagnosis not present

## 2016-08-30 DIAGNOSIS — R404 Transient alteration of awareness: Secondary | ICD-10-CM | POA: Diagnosis not present

## 2016-08-30 DIAGNOSIS — M25561 Pain in right knee: Secondary | ICD-10-CM | POA: Diagnosis present

## 2016-08-30 DIAGNOSIS — M84459D Pathological fracture, hip, unspecified, subsequent encounter for fracture with routine healing: Secondary | ICD-10-CM | POA: Diagnosis not present

## 2016-08-30 DIAGNOSIS — Z79899 Other long term (current) drug therapy: Secondary | ICD-10-CM

## 2016-08-30 DIAGNOSIS — B3731 Acute candidiasis of vulva and vagina: Secondary | ICD-10-CM

## 2016-08-30 DIAGNOSIS — S72002A Fracture of unspecified part of neck of left femur, initial encounter for closed fracture: Secondary | ICD-10-CM | POA: Diagnosis not present

## 2016-08-30 DIAGNOSIS — R2689 Other abnormalities of gait and mobility: Secondary | ICD-10-CM | POA: Diagnosis not present

## 2016-08-30 DIAGNOSIS — W1830XA Fall on same level, unspecified, initial encounter: Secondary | ICD-10-CM | POA: Diagnosis present

## 2016-08-30 DIAGNOSIS — K5900 Constipation, unspecified: Secondary | ICD-10-CM | POA: Diagnosis not present

## 2016-08-30 DIAGNOSIS — S72041A Displaced fracture of base of neck of right femur, initial encounter for closed fracture: Secondary | ICD-10-CM | POA: Diagnosis not present

## 2016-08-30 DIAGNOSIS — H409 Unspecified glaucoma: Secondary | ICD-10-CM | POA: Diagnosis present

## 2016-08-30 DIAGNOSIS — I351 Nonrheumatic aortic (valve) insufficiency: Secondary | ICD-10-CM | POA: Diagnosis not present

## 2016-08-30 DIAGNOSIS — M6281 Muscle weakness (generalized): Secondary | ICD-10-CM | POA: Diagnosis not present

## 2016-08-30 DIAGNOSIS — S72009A Fracture of unspecified part of neck of unspecified femur, initial encounter for closed fracture: Secondary | ICD-10-CM | POA: Diagnosis present

## 2016-08-30 DIAGNOSIS — Z4789 Encounter for other orthopedic aftercare: Secondary | ICD-10-CM | POA: Diagnosis not present

## 2016-08-30 DIAGNOSIS — R031 Nonspecific low blood-pressure reading: Secondary | ICD-10-CM | POA: Diagnosis not present

## 2016-08-30 DIAGNOSIS — Z471 Aftercare following joint replacement surgery: Secondary | ICD-10-CM | POA: Diagnosis not present

## 2016-08-30 DIAGNOSIS — S299XXA Unspecified injury of thorax, initial encounter: Secondary | ICD-10-CM | POA: Diagnosis not present

## 2016-08-30 DIAGNOSIS — M818 Other osteoporosis without current pathological fracture: Secondary | ICD-10-CM | POA: Diagnosis not present

## 2016-08-30 LAB — CBC WITH DIFFERENTIAL/PLATELET
Basophils Absolute: 0 10*3/uL (ref 0.0–0.1)
Basophils Relative: 0 %
Eosinophils Absolute: 0.1 10*3/uL (ref 0.0–0.7)
Eosinophils Relative: 1 %
HEMATOCRIT: 41.5 % (ref 36.0–46.0)
HEMOGLOBIN: 13.6 g/dL (ref 12.0–15.0)
LYMPHS ABS: 1.9 10*3/uL (ref 0.7–4.0)
Lymphocytes Relative: 16 %
MCH: 30.4 pg (ref 26.0–34.0)
MCHC: 32.8 g/dL (ref 30.0–36.0)
MCV: 92.8 fL (ref 78.0–100.0)
MONOS PCT: 8 %
Monocytes Absolute: 0.9 10*3/uL (ref 0.1–1.0)
NEUTROS ABS: 9.1 10*3/uL — AB (ref 1.7–7.7)
NEUTROS PCT: 75 %
Platelets: 227 10*3/uL (ref 150–400)
RBC: 4.47 MIL/uL (ref 3.87–5.11)
RDW: 13.4 % (ref 11.5–15.5)
WBC: 12 10*3/uL — ABNORMAL HIGH (ref 4.0–10.5)

## 2016-08-30 LAB — URINALYSIS, ROUTINE W REFLEX MICROSCOPIC
BILIRUBIN URINE: NEGATIVE
Glucose, UA: NEGATIVE mg/dL
Hgb urine dipstick: NEGATIVE
Ketones, ur: 15 mg/dL — AB
NITRITE: NEGATIVE
Protein, ur: NEGATIVE mg/dL
Specific Gravity, Urine: 1.01 (ref 1.005–1.030)
pH: 8.5 — ABNORMAL HIGH (ref 5.0–8.0)

## 2016-08-30 LAB — CREATININE, SERUM: CREATININE: 0.78 mg/dL (ref 0.44–1.00)

## 2016-08-30 LAB — CBC
HEMATOCRIT: 40.2 % (ref 36.0–46.0)
HEMOGLOBIN: 13.4 g/dL (ref 12.0–15.0)
MCH: 30.9 pg (ref 26.0–34.0)
MCHC: 33.3 g/dL (ref 30.0–36.0)
MCV: 92.6 fL (ref 78.0–100.0)
Platelets: 209 10*3/uL (ref 150–400)
RBC: 4.34 MIL/uL (ref 3.87–5.11)
RDW: 13.3 % (ref 11.5–15.5)
WBC: 15.2 10*3/uL — AB (ref 4.0–10.5)

## 2016-08-30 LAB — URINE MICROSCOPIC-ADD ON

## 2016-08-30 LAB — SURGICAL PCR SCREEN
MRSA, PCR: NEGATIVE
STAPHYLOCOCCUS AUREUS: NEGATIVE

## 2016-08-30 LAB — BASIC METABOLIC PANEL
ANION GAP: 10 (ref 5–15)
BUN: 11 mg/dL (ref 6–20)
CHLORIDE: 100 mmol/L — AB (ref 101–111)
CO2: 26 mmol/L (ref 22–32)
Calcium: 9.9 mg/dL (ref 8.9–10.3)
Creatinine, Ser: 0.82 mg/dL (ref 0.44–1.00)
GFR calc Af Amer: >60 mL/min (ref >60)
GFR calc non Af Amer: >60 mL/min (ref >60)
GLUCOSE: 100 mg/dL — AB (ref 65–99)
POTASSIUM: 4 mmol/L (ref 3.5–5.1)
Sodium: 136 mmol/L (ref 135–145)

## 2016-08-30 LAB — PROTIME-INR
INR: 1.08
Prothrombin Time: 14.1 seconds (ref 11.4–15.2)

## 2016-08-30 LAB — TROPONIN I: Troponin I: <0.03 ng/mL (ref <0.03)

## 2016-08-30 MED ORDER — OMEGA-3-ACID ETHYL ESTERS 1 G PO CAPS
1.0000 g | ORAL_CAPSULE | Freq: Every day | ORAL | Status: DC
  Administered 2016-09-01 – 2016-09-02 (×2): 1 g via ORAL
  Filled 2016-08-30 (×2): qty 1

## 2016-08-30 MED ORDER — POVIDONE-IODINE 10 % EX SWAB: 2.0000 "application " | Freq: Once | CUTANEOUS | Status: DC

## 2016-08-30 MED ORDER — ASPIRIN EC 81 MG PO TBEC
81.0000 mg | DELAYED_RELEASE_TABLET | Freq: Every day | ORAL | Status: DC
  Administered 2016-09-01 – 2016-09-02 (×2): 81 mg via ORAL
  Filled 2016-08-30 (×2): qty 1

## 2016-08-30 MED ORDER — HYDROCODONE-ACETAMINOPHEN 5-325 MG PO TABS
1.0000 | ORAL_TABLET | Freq: Four times a day (QID) | ORAL | Status: DC | PRN
  Administered 2016-08-30: 1 via ORAL
  Administered 2016-08-31 (×2): 2 via ORAL
  Filled 2016-08-30 (×3): qty 1
  Filled 2016-08-30 (×2): qty 2
  Filled 2016-08-30: qty 1

## 2016-08-30 MED ORDER — DONEPEZIL HCL 10 MG PO TABS
10.0000 mg | ORAL_TABLET | Freq: Every day | ORAL | Status: DC
  Administered 2016-09-01 – 2016-09-02 (×2): 10 mg via ORAL
  Filled 2016-08-30 (×2): qty 1

## 2016-08-30 MED ORDER — VITAMIN D 1000 UNITS PO TABS
4000.0000 [IU] | ORAL_TABLET | Freq: Every evening | ORAL | Status: DC
  Administered 2016-08-30 – 2016-09-01 (×3): 4000 [IU] via ORAL
  Filled 2016-08-30 (×3): qty 4

## 2016-08-30 MED ORDER — CITALOPRAM HYDROBROMIDE 20 MG PO TABS
20.0000 mg | ORAL_TABLET | Freq: Every day | ORAL | Status: DC
  Administered 2016-09-01 – 2016-09-02 (×2): 20 mg via ORAL
  Filled 2016-08-30 (×2): qty 1

## 2016-08-30 MED ORDER — MORPHINE SULFATE (PF) 4 MG/ML IV SOLN
2.0000 mg | Freq: Once | INTRAVENOUS | Status: AC
  Administered 2016-08-30: 2 mg via INTRAVENOUS
  Filled 2016-08-30: qty 1

## 2016-08-30 MED ORDER — DEXTROSE-NACL 5-0.45 % IV SOLN
INTRAVENOUS | Status: DC
  Administered 2016-08-30: 16:00:00 via INTRAVENOUS

## 2016-08-30 MED ORDER — CHLORHEXIDINE GLUCONATE 4 % EX LIQD
60.0000 mL | Freq: Once | CUTANEOUS | Status: AC
  Administered 2016-08-31: 4 via TOPICAL
  Filled 2016-08-30: qty 60

## 2016-08-30 MED ORDER — NYSTATIN 100000 UNIT/GM EX POWD
Freq: Three times a day (TID) | CUTANEOUS | Status: DC
  Administered 2016-08-30: 1 via TOPICAL
  Administered 2016-08-31 – 2016-09-02 (×6): via TOPICAL
  Filled 2016-08-30: qty 15

## 2016-08-30 MED ORDER — SENNOSIDES-DOCUSATE SODIUM 8.6-50 MG PO TABS: 1.0000 | ORAL_TABLET | Freq: Every evening | ORAL | Status: DC | PRN

## 2016-08-30 MED ORDER — ENOXAPARIN SODIUM 40 MG/0.4ML ~~LOC~~ SOLN
40.0000 mg | SUBCUTANEOUS | Status: DC
  Administered 2016-08-30: 40 mg via SUBCUTANEOUS
  Filled 2016-08-30: qty 0.4

## 2016-08-30 MED ORDER — DEXTROSE 5 % IV SOLN
1.0000 g | INTRAVENOUS | Status: DC
  Administered 2016-08-30 – 2016-09-02 (×4): 1 g via INTRAVENOUS
  Filled 2016-08-30 (×4): qty 10

## 2016-08-30 MED ORDER — FLUCONAZOLE 100 MG PO TABS
150.0000 mg | ORAL_TABLET | Freq: Once | ORAL | Status: AC
  Administered 2016-08-30: 150 mg via ORAL
  Filled 2016-08-30: qty 1.5

## 2016-08-30 MED ORDER — MORPHINE SULFATE (PF) 4 MG/ML IV SOLN: 0.5000 mg | INTRAVENOUS | Status: DC | PRN

## 2016-08-30 MED ORDER — CALCIUM CARBONATE-VITAMIN D 500-200 MG-UNIT PO TABS
1.0000 | ORAL_TABLET | Freq: Two times a day (BID) | ORAL | Status: DC
  Administered 2016-08-30 – 2016-09-02 (×5): 1 via ORAL
  Filled 2016-08-30 (×5): qty 1

## 2016-08-30 MED ORDER — PROSIGHT PO TABS
1.0000 | ORAL_TABLET | Freq: Every day | ORAL | Status: DC
  Administered 2016-09-01 – 2016-09-02 (×2): 1 via ORAL
  Filled 2016-08-30: qty 1

## 2016-08-30 MED ORDER — SENNOSIDES-DOCUSATE SODIUM 8.6-50 MG PO TABS
1.0000 | ORAL_TABLET | Freq: Every day | ORAL | Status: DC
  Administered 2016-08-30 – 2016-09-01 (×3): 1 via ORAL
  Filled 2016-08-30 (×3): qty 1

## 2016-08-30 MED ORDER — TIMOLOL MALEATE 0.25 % OP SOLN
1.0000 [drp] | Freq: Two times a day (BID) | OPHTHALMIC | Status: DC
  Administered 2016-09-01 – 2016-09-02 (×3): 1 [drp] via OPHTHALMIC
  Filled 2016-08-30: qty 5

## 2016-08-30 MED ORDER — BISACODYL 5 MG PO TBEC: 5.0000 mg | DELAYED_RELEASE_TABLET | Freq: Every day | ORAL | Status: DC | PRN

## 2016-08-30 MED ORDER — CEFAZOLIN SODIUM-DEXTROSE 2-4 GM/100ML-% IV SOLN: 2.0000 g | INTRAVENOUS | Status: DC

## 2016-08-30 MED ORDER — ATORVASTATIN CALCIUM 20 MG PO TABS
20.0000 mg | ORAL_TABLET | Freq: Every day | ORAL | Status: DC
  Administered 2016-09-01 – 2016-09-02 (×2): 20 mg via ORAL
  Filled 2016-08-30 (×2): qty 1

## 2016-08-30 NOTE — ED Triage Notes (Signed)
Pt was in a walk this morning when she felt suddenly very weak and went down to the ground denies LOC pt has obvious shortening from one L leg to the right pt c/o pain to the hip region

## 2016-08-30 NOTE — ED Provider Notes (Signed)
[facility]-EMERGENCY DEPT Provider Note   CSN: [id number] Arrival date & time: 08/30/16  1135     History   Chief Complaint Chief Complaint  Patient presents with  . Near Syncope    pt participating in a walk when about 1 mile into the walk she had a near syncopal episode pt had some control when she went down has no injury to her     HPI Gabrielle [name] is a 82 y.o. female.  HPI Patient was walking in a parade this morning. She reports she felt well, she was starting to feel a little fatigued from the length of the walk. She reports is very little warning she found her self on the ground. She is not sure if she tripped or just collapsed. She does not think she had complete loss of consciousness. The patient reports that she was immediately aware of her surroundings. She states the only pain she has is in the left hip. She reports she doesn't think she hit her head because she believes she "crumpled" to the ground. She has no headache, she reports upon awakening there was no feeling of numbness or weakness to the extremities. She did not experience any chest pain or palpitations. Reports she has felt well leading up to this and did not feel that she was getting sick. Past Medical History:  Diagnosis Date  . Arthritis    oa  . Glaucoma 08/04/2015  . Hyperlipidemia   . Mild aortic insufficiency: Per 2 d echo 08/04/2015 08/04/2015  . Moderate mitral regurgitation: Per 2 d echo 08/04/2015 08/04/2015    Patient Active Problem List   Diagnosis Date Noted  . Hip fracture (HCC) 08/30/2016  . Vascular dementia 08/30/2016  . Osteoporosis 08/30/2016  . Mild aortic insufficiency: Per 2 d echo 08/04/2015 08/04/2015  . Moderate mitral regurgitation: Per 2 d echo 08/04/2015 08/04/2015  . Glaucoma 08/04/2015  . Orthostasis: Mild 08/04/2015  . Syncope 08/03/2015  . Depression 12/09/2013  . Constipation 11/30/2013  . Pure hypercholesterolemia 11/30/2013  . Acute posthemorrhagic anemia 11/30/2013    . OA (osteoarthritis) of knee 11/21/2013    Past Surgical History:  Procedure Laterality Date  . ABDOMINAL HYSTERECTOMY    . benign breast tumor removed  age 3  . TONSILLECTOMY  age 8  . TOTAL KNEE ARTHROPLASTY Right 11/21/2013   Procedure: RIGHT TOTAL KNEE ARTHROPLASTY;  Surgeon: Loanne Drilling, MD;  Location: [facility];  Service: Orthopedics;  Laterality: Right;  . WRIST FRACTURE SURGERY Right     OB History    No data available       Home Medications    Prior to Admission medications   Medication Sig Start Date End Date Taking? Authorizing Provider  aspirin 81 MG tablet Take 81 mg by mouth daily.     Historical Provider, MD  atorvastatin (LIPITOR) 20 MG tablet Take 20 mg by mouth daily.    Historical Provider, MD  Calcium Carb-Cholecalciferol (CALCIUM 600 + D PO) Take 1 tablet by mouth 2 (two) times daily.    Historical Provider, MD  Cholecalciferol (VITAMIN D) 2000 UNITS CAPS Take 4,000 Units by mouth every evening.     Historical Provider, MD  citalopram (CELEXA) 20 MG tablet Take 20 mg by mouth daily.     Historical Provider, MD  donepezil (ARICEPT) 10 MG tablet Take 10 mg by mouth daily. 06/19/15   Historical Provider, MD  Multiple Vitamins-Minerals (ICAPS MV) TABS Take 1 tablet by mouth daily.  Historical Provider, MD  Omega-3 Fatty Acids (FISH OIL) 1000 MG CAPS Take 1,000 mg by mouth every evening.    Historical Provider, MD  senna-docusate (SENOKOT-S) 8.6-50 MG per tablet Take 1 tablet by mouth at bedtime. 08/04/15   Rodolph Bong, MD  timolol (BETIMOL) 0.25 % ophthalmic solution Place 1 drop into both eyes 2 (two) times daily.     Historical Provider, MD    Family History No family history on file.  Social History Social History  Substance Use Topics  . Smoking status: Former Smoker    Packs/day: 1.00    Years: 17.00    Types: Cigarettes    Quit date: 11/10/1965  . Smokeless tobacco: Never Used  . Alcohol use Yes     Comment: very occasional wine      Allergies   Review of patient's allergies indicates no known allergies.   Review of Systems Review of Systems  10 Systems reviewed and are negative for acute change except as noted in the HPI.  Physical Exam Updated Vital Signs BP 106/68 (BP Location: Right Arm)   Pulse (!) 58   Temp 97.7 F (36.5 C) (Oral)   Resp 14   Ht 5\' 10"  (1.778 m)   Wt 160 lb (72.6 kg)   SpO2 100%   BMI 22.96 kg/m   Physical Exam  Constitutional: She appears well-developed and well-nourished. No distress.  HENT:  Head: Normocephalic and atraumatic.  Right Ear: External ear normal.  Left Ear: External ear normal.  Nose: Nose normal.  Mouth/Throat: Oropharynx is clear and moist.  Eyes: Conjunctivae and EOM are normal. Pupils are equal, round, and reactive to light.  Neck: Neck supple.  No cervical spine tenderness to palpation.  Cardiovascular: Normal rate and regular rhythm.   No murmur heard. Pulmonary/Chest: Effort normal and breath sounds normal. No respiratory distress. She exhibits no tenderness.  Abdominal: Soft. There is no tenderness.  Musculoskeletal: She exhibits no edema.  Neurological: She is alert.  Skin: Skin is warm and dry.  Psychiatric: She has a normal mood and affect.  Nursing note and vitals reviewed.    ED Treatments / Results  Labs (all labs ordered are listed, but only abnormal results are displayed) Labs Reviewed  BASIC METABOLIC PANEL  TROPONIN I  CBC WITH DIFFERENTIAL/PLATELET  PROTIME-INR  URINALYSIS, ROUTINE W REFLEX MICROSCOPIC (NOT AT [facility])    EKG  EKG Interpretation  Date/Time:  Saturday August 30 2016 13:11:58 EDT Ventricular Rate:  59 PR Interval:    QRS Duration: 94 QT Interval:  462 QTC Calculation: 458 R Axis:   34 Text Interpretation:  Sinus rhythm Borderline prolonged PR interval agree. no change from old Confirmed by Donnald Garre, MD, [place] [phone]) on 08/30/2016 1:39:13 PM       Radiology Dg Chest 1 View  Result Date:  08/30/2016 CLINICAL DATA:  Fall, left hip fracture EXAM: CHEST 1 VIEW COMPARISON:  08/03/2015 FINDINGS: Stable heart size and vascularity. No focal airspace process, collapse or consolidation. Negative for edema, effusion or pneumothorax. Atherosclerosis noted of the aorta. Advanced degenerative changes of the spine with a chronic scoliosis. Bones are osteopenic. IMPRESSION: Stable chest exam. No superimposed acute process or interval change. Aortic atherosclerosis Marked scoliosis as before. Electronically Signed   By: Judie Petit.  Shick M.D.   On: 08/30/2016 12:45   Dg Hip Unilat With Pelvis 2-3 Views Left  Result Date: 08/30/2016 CLINICAL DATA:  Fall, acute right hip injury and pain EXAM: DG HIP (WITH OR WITHOUT PELVIS) 2-3V  LEFT COMPARISON:  None available FINDINGS: There is an acute proximal left hip femoral neck fracture with mild displacement and angulation. Femoral head remains located in relation to the left acetabulum. Bones are osteopenic. Bony pelvis and right hip are intact. Degenerative changes present of the lumbar spine and SI joints. Normal bowel gas pattern. Peripheral atherosclerosis noted. IMPRESSION: Acute left hip proximal femoral neck fracture with mild angulation and displacement. Electronically Signed   By: Judie PetitM.  Shick M.D.   On: 08/30/2016 12:44    Procedures Procedures (including critical care time)  Medications Ordered in ED Medications  morphine 4 MG/ML injection 2 mg (not administered)     Initial Impression / Assessment and Plan / ED Course  I have reviewed the triage vital signs and the nursing notes.  Pertinent labs & imaging results that were available during my care of the patient were reviewed by me and considered in my medical decision making (see chart for details).  Clinical Course   Consult: Dr. Ophelia CharterY[facility] will see the patient later today for assessment Consult: Dr. Illene LabradorLangland for admission.  Final Clinical Impressions(s) / ED Diagnoses   Final  diagnoses:  Syncope and collapse  Closed fracture of left hip, initial encounter [facility](HCC)  Patient had an episode today that may reflect syncopal episode. Patient does not specifically endorse mechanical fall. She describes being on the ground without warning. Upon awakening she had no chest pain no headache and no shortness of breath. Patient was aware of her surroundings without neurologic deficit. At time of evaluation she is alert and appropriate. She has no pain complaints except her hip if it is moved. Patient will be admitted for further diagnostic testing and treatment.  New Prescriptions New Prescriptions   No medications on file     Arby BarretteMarcy Lional Icenogle, MD 08/30/16 1339

## 2016-08-30 NOTE — Progress Notes (Signed)
Orthopedic Tech Progress Note Patient Details:  Gabrielle LeanBarbara A [name] [birth year] [id number]  Musculoskeletal Traction Type of Traction: [place] Skin Traction Traction Weight: 5 lbs    Saul FordyceJennifer C Liam Bossman 08/30/2016, 4:23 PM

## 2016-08-30 NOTE — Consult Note (Addendum)
Reason for Consult:left femoral neck fracture , displaced Referring Physician: Julien Nordmann MD  Gabrielle [name] is an 82 y.o. female.  HPI: 82 yo with mild dementia was in walk for Alzheimer's and felt lightheaded and fell with left hip pain and unable to walk. No Hx of previous hip pain.  Past right TKA doing well.   Past Medical History:  Diagnosis Date  . Arthritis    oa  . Glaucoma 08/04/2015  . Hyperlipidemia   . Mild aortic insufficiency: Per 2 d echo 08/04/2015 08/04/2015  . Moderate mitral regurgitation: Per 2 d echo 08/04/2015 08/04/2015    Past Surgical History:  Procedure Laterality Date  . ABDOMINAL HYSTERECTOMY    . benign breast tumor removed  age 37  . TONSILLECTOMY  age 25  . TOTAL KNEE ARTHROPLASTY Right 11/21/2013   Procedure: RIGHT TOTAL KNEE ARTHROPLASTY;  Surgeon: Loanne Drilling, MD;  Location: [facility];  Service: Orthopedics;  Laterality: Right;  . WRIST FRACTURE SURGERY Right     No family history on file.  Social History:  reports that she quit smoking about 50 years ago. Her smoking use included Cigarettes. She has a 17.00 pack-year smoking history. She has never used smokeless tobacco. She reports that she drinks alcohol. She reports that she does not use drugs.  Allergies: No Known Allergies  Medications: I have reviewed the patient's current medications.  Results for orders placed or performed during the hospital encounter of 08/30/16 (from the past 48 hour(s))  CBC with Differential     Status: Abnormal   Collection Time: 08/30/16 12:52 PM  Result Value Ref Range   WBC 12.0 (H) 4.0 - 10.5 K/uL   RBC 4.47 3.87 - 5.11 MIL/uL   Hemoglobin 13.6 12.0 - 15.0 g/dL   HCT 16.1 09.6 - 04.5 %   MCV 92.8 78.0 - 100.0 fL   MCH 30.4 26.0 - 34.0 pg   MCHC 32.8 30.0 - 36.0 g/dL   RDW 40.9 81.1 - 91.4 %   Platelets 227 150 - 400 K/uL   Neutrophils Relative % 75 %   Neutro Abs 9.1 (H) 1.7 - 7.7 K/uL   Lymphocytes Relative 16 %   Lymphs Abs 1.9 0.7 - 4.0 K/uL    Monocytes Relative 8 %   Monocytes Absolute 0.9 0.1 - 1.0 K/uL   Eosinophils Relative 1 %   Eosinophils Absolute 0.1 0.0 - 0.7 K/uL   Basophils Relative 0 %   Basophils Absolute 0.0 0.0 - 0.1 K/uL  Protime-INR     Status: None   Collection Time: 08/30/16 12:52 PM  Result Value Ref Range   Prothrombin Time 14.1 11.4 - 15.2 seconds   INR 1.08   Urinalysis, Routine w reflex microscopic     Status: Abnormal   Collection Time: 08/30/16  1:17 PM  Result Value Ref Range   Color, Urine YELLOW YELLOW   APPearance CLOUDY (A) CLEAR   Specific Gravity, Urine 1.010 1.005 - 1.030   pH 8.5 (H) 5.0 - 8.0   Glucose, UA NEGATIVE NEGATIVE mg/dL   Hgb urine dipstick NEGATIVE NEGATIVE   Bilirubin Urine NEGATIVE NEGATIVE   Ketones, ur 15 (A) NEGATIVE mg/dL   Protein, ur NEGATIVE NEGATIVE mg/dL   Nitrite NEGATIVE NEGATIVE   Leukocytes, UA TRACE (A) NEGATIVE  Urine microscopic-add on     Status: Abnormal   Collection Time: 08/30/16  1:17 PM  Result Value Ref Range   Squamous Epithelial / LPF 0-5 (A) NONE SEEN   WBC,  UA 0-5 0 - 5 WBC/hpf   RBC / HPF 0-5 0 - 5 RBC/hpf   Bacteria, UA RARE (A) NONE SEEN    Dg Chest 1 View  Result Date: 08/30/2016 CLINICAL DATA:  Fall, left hip fracture EXAM: CHEST 1 VIEW COMPARISON:  08/03/2015 FINDINGS: Stable heart size and vascularity. No focal airspace process, collapse or consolidation. Negative for edema, effusion or pneumothorax. Atherosclerosis noted of the aorta. Advanced degenerative changes of the spine with a chronic scoliosis. Bones are osteopenic. IMPRESSION: Stable chest exam. No superimposed acute process or interval change. Aortic atherosclerosis Marked scoliosis as before. Electronically Signed   By: Judie PetitM.  Shick M.D.   On: 08/30/2016 12:45   Dg Hip Unilat With Pelvis 2-3 Views Left  Result Date: 08/30/2016 CLINICAL DATA:  Fall, acute right hip injury and pain EXAM: DG HIP (WITH OR WITHOUT PELVIS) 2-3V LEFT COMPARISON:  None available FINDINGS: There  is an acute proximal left hip femoral neck fracture with mild displacement and angulation. Femoral head remains located in relation to the left acetabulum. Bones are osteopenic. Bony pelvis and right hip are intact. Degenerative changes present of the lumbar spine and SI joints. Normal bowel gas pattern. Peripheral atherosclerosis noted. IMPRESSION: Acute left hip proximal femoral neck fracture with mild angulation and displacement. Electronically Signed   By: Judie PetitM.  Shick M.D.   On: 08/30/2016 12:44    Review of Systems  Constitutional: Negative for chills and fever.  Respiratory: Negative.   Cardiovascular:       Echo shows some valvular mild /mod regurg/stenosis 2016  Gastrointestinal: Negative.   Genitourinary: Negative.   Musculoskeletal: Negative for myalgias and neck pain.       Right TKA doing well  Neurological: Positive for dizziness.  Endo/Heme/Allergies: Does not bruise/bleed easily.  Psychiatric/Behavioral: Positive for memory loss.   Blood pressure 149/68, pulse 65, temperature 97.7 F (36.5 C), temperature source Oral, resp. rate 22, height 5\' 10"  (1.778 m), weight 72.6 kg (160 lb), SpO2 100 %. Physical Exam  Constitutional: She is oriented to person, place, and time. She appears well-developed and well-nourished.  HENT:  Head: Normocephalic and atraumatic.  Eyes: Pupils are equal, round, and reactive to light.  Neck: Normal range of motion. Neck supple.  Cardiovascular: Normal rate and regular rhythm.   Respiratory: Effort normal and breath sounds normal.  GI: Soft. Bowel sounds are normal.  Musculoskeletal:  Left LE short and ER pulses intact  Neurological: She is alert and oriented to person, place, and time.  Skin: Skin is warm and dry.  Psychiatric: She has a normal mood and affect. Her behavior is normal.    Assessment/Plan: Left femoral neck fracture displaced. Will proceed with surgery as soon as medical service clears her to proceed. Discussed plan with pt and  granddaughter at bedside Duwayne Heck( Danielle ) they agree to proceed.  My cell [phone]  Eldred MangesMark C Jovi Zavadil 08/30/2016, 2:19 PM

## 2016-08-30 NOTE — Progress Notes (Signed)
Patient ID: Gabrielle [name], female   DOB: 1927-06-29, 82 y.o.   MRN: [id number] Posted for surgery at 9 AM Sunday and will proceed if OK with Hospitalist service. NPO after MN

## 2016-08-30 NOTE — H&P (Signed)
[facility] History and Physical  GEISHA ABERNATHY ZOX:[id number] DOB: September 25, 1927 DOA: 08/30/2016  Referring physician: ED PCP: Gweneth Dimitri, MD   Chief Complaint: left hip pain  HPI: Gabrielle [name] is a 82 y.o. female with history of hyperlipidemia, osteoporosis, depression, arthritis, vascular dementia (poor short term memory),  prior history of syncope.  Per granddaughter,  syncopal /?vasovagal episode in September 2016 with follow-up with cardiology was which was unremarkable.  Patient presents to the ED today after fall. Apparently she was walking in Alzheimer's walk today,  walked about half a mile or so when she was starting to get little bit tired.  All of a sudden, her left leg collapse and she fell to the ground. She denies any prodrome symptoms. Denies any loss of consciousness. Denies any presyncopal/ syncopal spell complaints. Denies any dizziness, visual disturbances, nausea or vomiting or a black curtain of her eyes. Denies any chest pain, palpitations or shortness of breath at the time.  She denies tripping over anything today.  Patient states typically her mobility is limited due to her right knee pains. She is unable to do go up and down stairs because of her right knee problems,  but denies any chest pain or palpitations or anginal symptoms while walking.  She does not use a walker.  Of note, patient's granddaughter is here with her is giving her a lot of the history.  Pt lives is an assisted living facility, has a history of osteoporosis but has been very forgetful about taking her pills.  However, with hhn,  better at taking her calcium, vitamin D replacement.  Gets frequent UTIs as well., no recent f/c.  Review of Systems:  Per HPI, o/w all systems reviewed and negative.  Past Medical History:  Diagnosis Date  . Arthritis    oa  . Glaucoma 08/04/2015  . Hyperlipidemia   . Mild aortic insufficiency: Per 2 d echo 08/04/2015 08/04/2015  . Moderate mitral  regurgitation: Per 2 d echo 08/04/2015 08/04/2015   Past Surgical History:  Procedure Laterality Date  . ABDOMINAL HYSTERECTOMY    . benign breast tumor removed  age 89  . TONSILLECTOMY  age 89  . TOTAL KNEE ARTHROPLASTY Right 11/21/2013   Procedure: RIGHT TOTAL KNEE ARTHROPLASTY;  Surgeon: Loanne Drilling, MD;  Location: [facility];  Service: Orthopedics;  Laterality: Right;  . WRIST FRACTURE SURGERY Right    Social History:  reports that she quit smoking about 50 years ago. Her smoking use included Cigarettes. She has a 17.00 pack-year smoking history. She has never used smokeless tobacco. She reports that she drinks alcohol. She reports that she does not use drugs.  No Known Allergies  Family hx: No pertinent family history. mother deceased age 80 cause unknown. Father deceased age 25 from esophageal cancer.  Prior to Admission medications   Medication Sig Start Date End Date Taking? Authorizing Provider  aspirin 81 MG tablet Take 81 mg by mouth daily.     Historical Provider, MD  atorvastatin (LIPITOR) 20 MG tablet Take 20 mg by mouth daily.    Historical Provider, MD  Calcium Carb-Cholecalciferol (CALCIUM 600 + D PO) Take 1 tablet by mouth 2 (two) times daily.    Historical Provider, MD  Cholecalciferol (VITAMIN D) 2000 UNITS CAPS Take 4,000 Units by mouth every evening.     Historical Provider, MD  citalopram (CELEXA) 20 MG tablet Take 20 mg by mouth daily.     Historical Provider, MD  donepezil (ARICEPT) 10 MG tablet  Take 10 mg by mouth daily. 06/19/15   Historical Provider, MD  Multiple Vitamins-Minerals (ICAPS MV) TABS Take 1 tablet by mouth daily.     Historical Provider, MD  Omega-3 Fatty Acids (FISH OIL) 1000 MG CAPS Take 1,000 mg by mouth every evening.    Historical Provider, MD  senna-docusate (SENOKOT-S) 8.6-50 MG per tablet Take 1 tablet by mouth at bedtime. 08/04/15   Rodolph Bong, MD  timolol (BETIMOL) 0.25 % ophthalmic solution Place 1 drop into both eyes 2 (two) times  daily.     Historical Provider, MD   Physical Exam: Vitals:   08/30/16 1139 08/30/16 1140 08/30/16 1146  BP:   106/68  Pulse:  (!) 58   Resp:  14   Temp:  97.7 F (36.5 C)   TempSrc:  Oral   SpO2:  100%   Weight: 72.6 kg (160 lb)    Height: 5\' 10"  (1.778 m)      Wt Readings from Last 3 Encounters:  08/30/16 72.6 kg (160 lb)  08/04/15 69.9 kg (154 lb 3.2 oz)  11/14/14 74.4 kg (164 lb)    General:  Appears calm and comfortable, pleasant, NAD, AAOx3, pleasant Eyes: PERRL, normal lids, irises & conjunctiva ENT: grossly normal hearing, lips & tongue, mmm Neck: no LAD, masses or thyromegaly Cardiovascular: RRR, no m/r/g. No LE edema. No carotid bruits. Telemetry: SR, no arrhythmias  Respiratory: CTA bilaterally, no w/r/r. Normal respiratory effort. Abdomen: soft, ntnd, obese,  Skin: red rash vaginal area, no other rash or induration seen on limited exam Musculoskeletal: grossly normal tone BUE/BLE, ttp left hip region, rom limited due to pain. Psychiatric: grossly normal mood and affect, speech fluent and appropriate Neurologic: grossly non-focal.          Labs on Admission:  Basic Metabolic Panel: No results for input(s): NA, K, CL, CO2, GLUCOSE, BUN, CREATININE, CALCIUM, MG, PHOS in the last 168 hours. Liver Function Tests: No results for input(s): AST, ALT, ALKPHOS, BILITOT, PROT, ALBUMIN in the last 168 hours. No results for input(s): LIPASE, AMYLASE in the last 168 hours. No results for input(s): AMMONIA in the last 168 hours. CBC: No results for input(s): WBC, NEUTROABS, HGB, HCT, MCV, PLT in the last 168 hours. Cardiac Enzymes: No results for input(s): CKTOTAL, CKMB, CKMBINDEX, TROPONINI in the last 168 hours.  BNP (last 3 results) No results for input(s): BNP in the last 8760 hours.  ProBNP (last 3 results) No results for input(s): PROBNP in the last 8760 hours.  CBG: No results for input(s): GLUCAP in the last 168 hours.  Radiological Exams on  Admission: Dg Chest 1 View  Result Date: 08/30/2016 CLINICAL DATA:  Fall, left hip fracture EXAM: CHEST 1 VIEW COMPARISON:  08/03/2015 FINDINGS: Stable heart size and vascularity. No focal airspace process, collapse or consolidation. Negative for edema, effusion or pneumothorax. Atherosclerosis noted of the aorta. Advanced degenerative changes of the spine with a chronic scoliosis. Bones are osteopenic. IMPRESSION: Stable chest exam. No superimposed acute process or interval change. Aortic atherosclerosis Marked scoliosis as before. Electronically Signed   By: Judie Petit.  Shick M.D.   On: 08/30/2016 12:45   Dg Hip Unilat With Pelvis 2-3 Views Left  Result Date: 08/30/2016 CLINICAL DATA:  Fall, acute right hip injury and pain EXAM: DG HIP (WITH OR WITHOUT PELVIS) 2-3V LEFT COMPARISON:  None available FINDINGS: There is an acute proximal left hip femoral neck fracture with mild displacement and angulation. Femoral head remains located in relation to the left acetabulum.  Bones are osteopenic. Bony pelvis and right hip are intact. Degenerative changes present of the lumbar spine and SI joints. Normal bowel gas pattern. Peripheral atherosclerosis noted. IMPRESSION: Acute left hip proximal femoral neck fracture with mild angulation and displacement. Electronically Signed   By: Judie Petit.  Shick M.D.   On: 08/30/2016 12:44    EKG: Independently reviewed.  At 1311, sr 59bpm, nad, no acute STEMI  EKG Interpretation  Date/Time:    Ventricular Rate:    PR Interval:    QRS Duration:   QT Interval:    QTC Calculation:   R Axis:     Text Interpretation:         Echo 08/04/15 Study Conclusions  - Left ventricle: The cavity size was normal. Systolic function was   normal. The estimated ejection fraction was in the range of 60%   to 65%. Wall motion was normal; there were no regional wall   motion abnormalities. Doppler parameters are consistent with   abnormal left ventricular relaxation (grade 1 diastolic    dysfunction). - Aortic valve: There was mild regurgitation. - Mitral valve: There was moderate regurgitation. - Left atrium: The atrium was moderately dilated. - Right atrium: The atrium was mildly dilated. - Tricuspid valve: There was moderate regurgitation. - Pulmonary arteries: Systolic pressure was mildly increased.  Assessment/Plan Principal Problem:   Hip fracture (HCC) Active Problems:   Pure hypercholesterolemia   Moderate mitral regurgitation: Per 2 d echo 08/04/2015   Vascular dementia   Osteoporosis   Vaginal candida   UTI (urinary tract infection)   1. Acute left hip proximal femoral neck fracture with mild angulation and displacement - suspect mechanical fall with patient's story in conjunction with fatigue from walking in the Alzheimer's Walk and mild UTI. Doubt syncopal episode or presyncopal episode or cardiac event given pt is asymptomatic.  She had history of syncopal episode last year. At that time, felt possibly due to low blood pressures, for which she is currently not on any blood pressure meds.  She was normotensive on arrival to ED.  - Admit telemetry. - Will follow-up with admitted labs including the cardiac enzymes.  - Her EKG was unremarkable at this time her meds is about the floor.  cxr neg. - She is unable to tolerate going up and down stairs limited to her to her right knee pain and not cardiac complaints. No anginal complaints.  No  further workup recommended at this time, given current ACC/AHA guidelines. - suspect low grade uti/candida infection may have contributed to fall as well - Pt is considered low-moderate risk for her advanced age, - orthopedics consulted, appreciate assistance, for planned surgery at 9am tomorw. - dw granddgt high mortality rates in general for elderly patients after hip repair, and need for close aftercare in first year. - pt's POA is actually pt's son (granddgt's uncle - who is out on vacation currently - pt is DNR, recd  rescind for procedure. Pt/granddgt agreeable to this. - pain control - defer PT/OT to ortho - cs sw/cm/dietition.  2. Osteoporosis - nonadherent to medications recommend in the past. Now with home health aide, has medication assistance with calcium and vitamin D supplements. Per granddaughter, she was very forgetful about her oral bisphosphonates in the past and currently not on any.  3. Hyperlipidemia - resume statin  4. Vascular dementia - mild, short term memory loss per pt/granddgt.  5. Moderate Mitral regur on 08/04/15 echo - outpt f/u recd.  6. Vaginal candida infection on exam -  diflucan x 1, nystatin powder   Ortho, dw surgeon, appreciate assistance, for OR 9am.  Code Status: dnr, ok to rescind for surgery per pt/granddgt DVT Prophylaxis: lovenox Family Communication: pt and granddgt at bedside Disposition Plan: inpt tele  Time spent: 45mins  Pete Glatterawn T Rhoda Waldvogel MD., MBA/MHA [facility] Pager [phone]

## 2016-08-30 NOTE — ED Notes (Signed)
Pt brought in to room appears in no distress only complains of pain when L leg is moved around

## 2016-08-31 ENCOUNTER — Encounter (HOSPITAL_COMMUNITY): Payer: Self-pay | Admitting: Certified Registered Nurse Anesthetist

## 2016-08-31 ENCOUNTER — Inpatient Hospital Stay (HOSPITAL_COMMUNITY): Payer: Medicare Other | Admitting: Anesthesiology

## 2016-08-31 ENCOUNTER — Inpatient Hospital Stay (HOSPITAL_COMMUNITY): Payer: Medicare Other

## 2016-08-31 ENCOUNTER — Encounter (HOSPITAL_COMMUNITY)
Admission: EM | Disposition: A | Discharge status: Skilled Nursing Facility | Payer: Self-pay | Source: Home / Self Care | Attending: Family Medicine

## 2016-08-31 DIAGNOSIS — R031 Nonspecific low blood-pressure reading: Secondary | ICD-10-CM

## 2016-08-31 DIAGNOSIS — B373 Candidiasis of vulva and vagina: Secondary | ICD-10-CM

## 2016-08-31 DIAGNOSIS — M818 Other osteoporosis without current pathological fracture: Secondary | ICD-10-CM

## 2016-08-31 DIAGNOSIS — F0150 Vascular dementia without behavioral disturbance: Secondary | ICD-10-CM

## 2016-08-31 DIAGNOSIS — S72041A Displaced fracture of base of neck of right femur, initial encounter for closed fracture: Secondary | ICD-10-CM

## 2016-08-31 DIAGNOSIS — S72002D Fracture of unspecified part of neck of left femur, subsequent encounter for closed fracture with routine healing: Secondary | ICD-10-CM

## 2016-08-31 HISTORY — PX: HIP ARTHROPLASTY: SHX981

## 2016-08-31 LAB — CBC
HEMATOCRIT: 37.4 % (ref 36.0–46.0)
HEMATOCRIT: 34.4 % — AB (ref 36.0–46.0)
HEMOGLOBIN: 12 g/dL (ref 12.0–15.0)
HEMOGLOBIN: 11.1 g/dL — AB (ref 12.0–15.0)
MCH: 30 pg (ref 26.0–34.0)
MCH: 30 pg (ref 26.0–34.0)
MCHC: 32.1 g/dL (ref 30.0–36.0)
MCHC: 32.3 g/dL (ref 30.0–36.0)
MCV: 93.5 fL (ref 78.0–100.0)
MCV: 93 fL (ref 78.0–100.0)
Platelets: 186 10*3/uL (ref 150–400)
Platelets: 185 10*3/uL (ref 150–400)
RBC: 4 MIL/uL (ref 3.87–5.11)
RBC: 3.7 MIL/uL — AB (ref 3.87–5.11)
RDW: 13.6 % (ref 11.5–15.5)
RDW: 13.7 % (ref 11.5–15.5)
WBC: 14.6 10*3/uL — AB (ref 4.0–10.5)
WBC: 11 10*3/uL — AB (ref 4.0–10.5)

## 2016-08-31 LAB — CREATININE, SERUM: CREATININE: 0.74 mg/dL (ref 0.44–1.00)

## 2016-08-31 LAB — URINE CULTURE: Culture: NO GROWTH

## 2016-08-31 SURGERY — HEMIARTHROPLASTY, HIP, DIRECT ANTERIOR APPROACH, FOR FRACTURE
Anesthesia: Regional | Site: Hip | Laterality: Left

## 2016-08-31 MED ORDER — LACTATED RINGERS IV SOLN
INTRAVENOUS | Status: DC
  Administered 2016-08-31: 17:00:00 via INTRAVENOUS
  Administered 2016-09-01: 100 mL/h via INTRAVENOUS

## 2016-08-31 MED ORDER — ONDANSETRON HCL 4 MG PO TABS
4.0000 mg | ORAL_TABLET | Freq: Four times a day (QID) | ORAL | Status: DC | PRN
Start: 2016-08-31 — End: 2016-09-02

## 2016-08-31 MED ORDER — PROPOFOL 500 MG/50ML IV EMUL
INTRAVENOUS | Status: DC | PRN
  Administered 2016-08-31: 50 ug/kg/min via INTRAVENOUS

## 2016-08-31 MED ORDER — PHENYLEPHRINE HCL 10 MG/ML IJ SOLN
INTRAMUSCULAR | Status: DC | PRN
  Administered 2016-08-31: 80 ug via INTRAVENOUS
  Administered 2016-08-31: 120 ug via INTRAVENOUS

## 2016-08-31 MED ORDER — ONDANSETRON HCL 4 MG/2ML IJ SOLN
INTRAMUSCULAR | Status: DC | PRN
  Administered 2016-08-31: 4 mg via INTRAVENOUS

## 2016-08-31 MED ORDER — FENTANYL CITRATE (PF) 100 MCG/2ML IJ SOLN
INTRAMUSCULAR | Status: DC | PRN
  Administered 2016-08-31 (×2): 25 ug via INTRAVENOUS

## 2016-08-31 MED ORDER — ENOXAPARIN SODIUM 30 MG/0.3ML ~~LOC~~ SOLN: 30.0000 mg | SUBCUTANEOUS | Status: DC

## 2016-08-31 MED ORDER — BUPIVACAINE HCL (PF) 0.25 % IJ SOLN
INTRAMUSCULAR | Status: AC
  Filled 2016-08-31: qty 30

## 2016-08-31 MED ORDER — METOCLOPRAMIDE HCL 5 MG/ML IJ SOLN: 5.0000 mg | Freq: Three times a day (TID) | INTRAMUSCULAR | Status: DC | PRN

## 2016-08-31 MED ORDER — DOCUSATE SODIUM 100 MG PO CAPS
100.0000 mg | ORAL_CAPSULE | Freq: Two times a day (BID) | ORAL | Status: DC
  Administered 2016-08-31 – 2016-09-02 (×5): 100 mg via ORAL
  Filled 2016-08-31 (×5): qty 1

## 2016-08-31 MED ORDER — PHENYLEPHRINE HCL 10 MG/ML IJ SOLN
INTRAMUSCULAR | Status: DC | PRN
  Administered 2016-08-31: 20 ug/min via INTRAVENOUS

## 2016-08-31 MED ORDER — FENTANYL CITRATE (PF) 100 MCG/2ML IJ SOLN
INTRAMUSCULAR | Status: AC
  Filled 2016-08-31: qty 2

## 2016-08-31 MED ORDER — PHENOL 1.4 % MT LIQD: 1.0000 | OROMUCOSAL | Status: DC | PRN

## 2016-08-31 MED ORDER — ONDANSETRON HCL 4 MG/2ML IJ SOLN
INTRAMUSCULAR | Status: AC
  Filled 2016-08-31: qty 2

## 2016-08-31 MED ORDER — MORPHINE SULFATE (PF) 2 MG/ML IV SOLN: 2.0000 mg | INTRAVENOUS | Status: DC | PRN

## 2016-08-31 MED ORDER — LACTATED RINGERS IV SOLN
INTRAVENOUS | Status: DC | PRN
  Administered 2016-08-31 (×2): via INTRAVENOUS

## 2016-08-31 MED ORDER — SODIUM CHLORIDE 0.9 % IR SOLN
Status: DC | PRN
Start: 2016-08-31 — End: 2016-08-31
  Administered 2016-08-31: 1000 mL

## 2016-08-31 MED ORDER — PHENYLEPHRINE 40 MCG/ML (10ML) SYRINGE FOR IV PUSH (FOR BLOOD PRESSURE SUPPORT)
PREFILLED_SYRINGE | INTRAVENOUS | Status: AC
  Filled 2016-08-31: qty 10

## 2016-08-31 MED ORDER — DEXTROSE-NACL 5-0.45 % IV SOLN
INTRAVENOUS | Status: DC
  Administered 2016-08-31: 13:00:00 via INTRAVENOUS

## 2016-08-31 MED ORDER — ACETAMINOPHEN 650 MG RE SUPP
650.0000 mg | Freq: Four times a day (QID) | RECTAL | Status: DC | PRN
Start: 2016-08-31 — End: 2016-09-02

## 2016-08-31 MED ORDER — CEFTRIAXONE SODIUM 1 G IJ SOLR
1.0000 g | INTRAMUSCULAR | Status: AC
  Filled 2016-08-31: qty 10

## 2016-08-31 MED ORDER — PROPOFOL 1000 MG/100ML IV EMUL
INTRAVENOUS | Status: AC
  Filled 2016-08-31: qty 200

## 2016-08-31 MED ORDER — ONDANSETRON HCL 4 MG/2ML IJ SOLN: 4.0000 mg | Freq: Four times a day (QID) | INTRAMUSCULAR | Status: DC | PRN

## 2016-08-31 MED ORDER — PROPOFOL 10 MG/ML IV BOLUS
INTRAVENOUS | Status: DC | PRN
  Administered 2016-08-31: 40 mg via INTRAVENOUS

## 2016-08-31 MED ORDER — ENOXAPARIN SODIUM 40 MG/0.4ML ~~LOC~~ SOLN
40.0000 mg | SUBCUTANEOUS | Status: DC
  Administered 2016-09-01 – 2016-09-02 (×2): 40 mg via SUBCUTANEOUS
  Filled 2016-08-31 (×2): qty 0.4

## 2016-08-31 MED ORDER — MENTHOL 3 MG MT LOZG: 1.0000 | LOZENGE | OROMUCOSAL | Status: DC | PRN

## 2016-08-31 MED ORDER — CEFAZOLIN SODIUM 1 G IJ SOLR
INTRAMUSCULAR | Status: DC | PRN
  Administered 2016-08-31: 2 g via INTRAMUSCULAR

## 2016-08-31 MED ORDER — FENTANYL CITRATE (PF) 100 MCG/2ML IJ SOLN: 25.0000 ug | INTRAMUSCULAR | Status: DC | PRN

## 2016-08-31 MED ORDER — METOCLOPRAMIDE HCL 5 MG PO TABS: 5.0000 mg | ORAL_TABLET | Freq: Three times a day (TID) | ORAL | Status: DC | PRN

## 2016-08-31 MED ORDER — BUPIVACAINE IN DEXTROSE 0.75-8.25 % IT SOLN
INTRATHECAL | Status: DC | PRN
  Administered 2016-08-31: 10 mg via INTRATHECAL

## 2016-08-31 MED ORDER — ACETAMINOPHEN 325 MG PO TABS
650.0000 mg | ORAL_TABLET | Freq: Four times a day (QID) | ORAL | Status: DC | PRN
  Filled 2016-08-31: qty 2

## 2016-08-31 MED ORDER — HYDROCODONE-ACETAMINOPHEN 5-325 MG PO TABS
1.0000 | ORAL_TABLET | Freq: Four times a day (QID) | ORAL | Status: DC | PRN
  Administered 2016-09-01 – 2016-09-02 (×3): 1 via ORAL

## 2016-08-31 MED ORDER — PROPOFOL 10 MG/ML IV BOLUS
INTRAVENOUS | Status: AC
  Filled 2016-08-31: qty 20

## 2016-08-31 MED ORDER — POLYETHYLENE GLYCOL 3350 17 G PO PACK: 17.0000 g | PACK | Freq: Every day | ORAL | Status: DC | PRN

## 2016-08-31 SURGICAL SUPPLY — 66 items
APL SKNCLS STERI-STRIP NONHPOA (GAUZE/BANDAGES/DRESSINGS) ×1
BENZOIN TINCTURE PRP APPL 2/3 (GAUZE/BANDAGES/DRESSINGS) ×3 IMPLANT
BLADE SAW SAG 73X25 THK (BLADE) ×2
BLADE SAW SGTL 73X25 THK (BLADE) ×1 IMPLANT
BLADE SURG ROTATE 9660 (MISCELLANEOUS) IMPLANT
BRUSH FEMORAL CANAL (MISCELLANEOUS) IMPLANT
CAPT HIP HEMI 1 ×2 IMPLANT
CLOSURE WOUND 1/2 X4 (GAUZE/BANDAGES/DRESSINGS) ×2
COVER BACK TABLE 24X17X13 BIG (DRAPES) IMPLANT
DRAPE IMP U-DRAPE 54X76 (DRAPES) ×3 IMPLANT
DRAPE INCISE IOBAN 66X45 STRL (DRAPES) ×2 IMPLANT
DRAPE ORTHO SPLIT 77X108 STRL (DRAPES) ×6
DRAPE SURG ORHT 6 SPLT 77X108 (DRAPES) ×2 IMPLANT
DRAPE U-SHAPE 47X51 STRL (DRAPES) ×3 IMPLANT
DRSG MEPILEX BORDER 4X8 (GAUZE/BANDAGES/DRESSINGS) ×1 IMPLANT
DRSG PAD ABDOMINAL 8X10 ST (GAUZE/BANDAGES/DRESSINGS) ×4 IMPLANT
DURAPREP 26ML APPLICATOR (WOUND CARE) ×3 IMPLANT
ELECT CAUTERY BLADE 6.4 (BLADE) ×3 IMPLANT
ELECT REM PT RETURN 9FT ADLT (ELECTROSURGICAL) ×3
ELECTRODE REM PT RTRN 9FT ADLT (ELECTROSURGICAL) ×1 IMPLANT
EVACUATOR 1/8 PVC DRAIN (DRAIN) IMPLANT
FACESHIELD WRAPAROUND (MASK) ×3 IMPLANT
FACESHIELD WRAPAROUND OR TEAM (MASK) ×2 IMPLANT
GAUZE SPONGE 4X4 12PLY STRL (GAUZE/BANDAGES/DRESSINGS) ×3 IMPLANT
GAUZE XEROFORM 5X9 LF (GAUZE/BANDAGES/DRESSINGS) ×1 IMPLANT
GLOVE BIOGEL PI IND STRL 8 (GLOVE) ×2 IMPLANT
GLOVE BIOGEL PI INDICATOR 8 (GLOVE) ×4
GLOVE ORTHO TXT STRL SZ7.5 (GLOVE) ×6 IMPLANT
GOWN STRL REUS W/ TWL LRG LVL3 (GOWN DISPOSABLE) ×1 IMPLANT
GOWN STRL REUS W/ TWL XL LVL3 (GOWN DISPOSABLE) ×1 IMPLANT
GOWN STRL REUS W/TWL 2XL LVL3 (GOWN DISPOSABLE) ×1 IMPLANT
GOWN STRL REUS W/TWL LRG LVL3 (GOWN DISPOSABLE) ×6
GOWN STRL REUS W/TWL XL LVL3 (GOWN DISPOSABLE) ×3
HANDPIECE INTERPULSE COAX TIP (DISPOSABLE)
IMMOBILIZER KNEE 20 (SOFTGOODS) ×2 IMPLANT
IMMOBILIZER KNEE 22 UNIV (SOFTGOODS) IMPLANT
KIT BASIN OR (CUSTOM PROCEDURE TRAY) ×3 IMPLANT
KIT ROOM TURNOVER OR (KITS) ×3 IMPLANT
MANIFOLD NEPTUNE II (INSTRUMENTS) ×3 IMPLANT
NDL HYPO 25GX1X1/2 BEV (NEEDLE) ×1 IMPLANT
NDL SUT 2 .5 CRC MAYO 1.732X (NEEDLE) ×1 IMPLANT
NEEDLE HYPO 25GX1X1/2 BEV (NEEDLE) IMPLANT
NEEDLE MAYO TAPER (NEEDLE) ×3
NS IRRIG 1000ML POUR BTL (IV SOLUTION) ×3 IMPLANT
PACK TOTAL JOINT (CUSTOM PROCEDURE TRAY) ×3 IMPLANT
PACK UNIVERSAL I (CUSTOM PROCEDURE TRAY) ×3 IMPLANT
PAD ARMBOARD 7.5X6 YLW CONV (MISCELLANEOUS) ×8 IMPLANT
PIN STEINMAN 3/16 (PIN) ×1 IMPLANT
SET HNDPC FAN SPRY TIP SCT (DISPOSABLE) IMPLANT
SPONGE LAP 4X18 X RAY DECT (DISPOSABLE) ×2 IMPLANT
STAPLER VISISTAT 35W (STAPLE) ×3 IMPLANT
STRIP CLOSURE SKIN 1/2X4 (GAUZE/BANDAGES/DRESSINGS) ×4 IMPLANT
SUCTION FRAZIER HANDLE 10FR (MISCELLANEOUS) ×2
SUCTION TUBE FRAZIER 10FR DISP (MISCELLANEOUS) ×1 IMPLANT
SUT ETHIBOND NAB CT1 #1 30IN (SUTURE) ×7 IMPLANT
SUT TICRON (SUTURE) ×2 IMPLANT
SUT VIC AB 2-0 CT1 27 (SUTURE) ×6
SUT VIC AB 2-0 CT1 TAPERPNT 27 (SUTURE) ×3 IMPLANT
SUT VICRYL 0 TIES 12 18 (SUTURE) ×3 IMPLANT
SYR CONTROL 10ML LL (SYRINGE) ×1 IMPLANT
TAPE CLOTH SURG 6X10 WHT LF (GAUZE/BANDAGES/DRESSINGS) ×2 IMPLANT
TOWEL OR 17X24 6PK STRL BLUE (TOWEL DISPOSABLE) ×3 IMPLANT
TOWEL OR 17X26 10 PK STRL BLUE (TOWEL DISPOSABLE) ×3 IMPLANT
TOWER CARTRIDGE SMART MIX (DISPOSABLE) IMPLANT
TRAY FOLEY CATH 16FRSI W/METER (SET/KITS/TRAYS/PACK) IMPLANT
WATER STERILE IRR 1000ML POUR (IV SOLUTION) ×6 IMPLANT

## 2016-08-31 NOTE — Transfer of Care (Signed)
Immediate Anesthesia Transfer of Care Note  Patient: Jane CanaryBarbara A Cavalieri  Procedure(s) Performed: Procedure(s): ARTHROPLASTY  HIP (HEMIARTHROPLASTY) (Left)  Patient Location: PACU  Anesthesia Type:Spinal  Level of Consciousness: awake and alert   Airway & Oxygen Therapy: Patient Spontanous Breathing and Patient connected to nasal cannula oxygen  Post-op Assessment: Report given to RN, post op vitals signs reviewed and hypotensive, Neosynephrine gtt initiated. Anesthesiologist notified.  Post vital signs: Reviewed and stable  Last Vitals:  Vitals:   08/30/16 2114 08/31/16 0429  BP: (!) 108/45 (!) 154/58  Pulse: 65 70  Resp: 18 18  Temp: 36.9 C 37.1 C    Last Pain:  Vitals:   08/31/16 0429  TempSrc: Oral  PainSc:          Complications: No apparent anesthesia complications

## 2016-08-31 NOTE — Anesthesia Preprocedure Evaluation (Addendum)
Anesthesia Evaluation   Patient awake    Reviewed: Allergy & Precautions, NPO status , Patient's Chart, lab work & pertinent test results  History of Anesthesia Complications Negative for: history of anesthetic complications  Airway Mallampati: II  TM Distance: >3 FB Neck ROM: Full    Dental  (+) Teeth Intact   Pulmonary neg pulmonary ROS, former smoker,    breath sounds clear to auscultation       Cardiovascular + Valvular Problems/Murmurs  Rhythm:Regular Rate:Normal + Systolic murmurs EKG and ECHO noted   Neuro/Psych Some dementia    GI/Hepatic negative GI ROS, Neg liver ROS,   Endo/Other  negative endocrine ROS  Renal/GU negative Renal ROS     Musculoskeletal  (+) Arthritis ,   Abdominal   Peds  Hematology  (+) anemia ,   Anesthesia Other Findings   Reproductive/Obstetrics                            Anesthesia Physical Anesthesia Plan  ASA: III  Anesthesia Plan: Regional and Spinal   Post-op Pain Management:    Induction: Intravenous  Airway Management Planned: Simple Face Mask  Additional Equipment:   Intra-op Plan:   Post-operative Plan: Extubation in OR  Informed Consent: I have reviewed the patients History and Physical, chart, labs and discussed the procedure including the risks, benefits and alternatives for the proposed anesthesia with the patient or authorized representative who has indicated his/her understanding and acceptance.     Plan Discussed with: CRNA  Anesthesia Plan Comments:         Anesthesia Quick Evaluation

## 2016-08-31 NOTE — Brief Op Note (Signed)
08/30/2016 - 08/31/2016  9:07 AM  PATIENT:  Britta MccreedyBarbara A Vickroy  82 y.o. female  PRE-OPERATIVE DIAGNOSIS:  left femoral neck fracture  POST-OPERATIVE DIAGNOSIS:  left femoral neck fracture  PROCEDURE:  Procedure(s): ARTHROPLASTY BIPOLAR HIP (HEMIARTHROPLASTY) (Left)  SURGEON:  Surgeon(s) and Role:    * Eldred MangesMark C Jaydah Stahle, MD - Primary  PHYSICIAN ASSISTANT:   ASSISTANTS: none   ANESTHESIA:   spinal  EBL:  Total I/O In: 1000 [I.V.:1000] Out: 400 [Urine:200; Blood:200]  BLOOD ADMINISTERED:none  DRAINS: none   LOCAL MEDICATIONS USED:  NONE  SPECIMEN:  No Specimen  DISPOSITION OF SPECIMEN:  N/A  COUNTS:  YES  TOURNIQUET:  * No tourniquets in log *  DICTATION: .Other Dictation: Dictation Number [id number]  PLAN OF CARE: already inpt  PATIENT DISPOSITION:  PACU - hemodynamically stable.   Delay start of Pharmacological VTE agent (>24hrs) due to surgical blood loss or risk of bleeding: yes

## 2016-08-31 NOTE — Progress Notes (Signed)
PROGRESS NOTE [facility]   Gabrielle [name]   ZOX:[id number] DOB: [birth year]  DOA: 08/30/2016 PCP: Gweneth Dimitri, MD   Brief Narrative:  Gabrielle [name] is a 82 y.o. female with history of hyperlipidemia, osteoporosis, depression, arthritis, vascular dementia presented to the ED after fall. She was walking in Alzheimer's walk,  walked about half a mile or so when she was starting to get little bit tired.  All of a sudden, her left leg collapse and she fell to the ground. Patient admitted with left hip fracture. Now s/p left hemiarthroplasty 10/22.  Subjective: Patient seen and examined at bedside, post op eval, feeling pretty well. Have no complaints. Pain is minimal.   Assessment & Plan:   1.Acute left hip proximal femoral neck fracture with mild angulation and displacement  secondary to mechanical fall. S/p Left hemiarthroplasty 10/22 -Management per Ortho  -pain control PRN   2. Low BP - no signs of shock, recent surgery, could be related to anesthesia vs acute blood loss during surgery -Repeat CBC, Hb initially 13.6 -IVF LR @ 100cc - monitor for signs of fluid overload - ECHO in 2016 grade 1 diastolic dfx   3. Osteoporosis - nonadherent to medications recommend in the past. Now with home health aide, has medication assistance with calcium and vitamin D supplements. -Outpatient follow up  4. Vascular dementia -  Stable  5. Vaginal candida infection - diflucan x 1, nystatin powder   DVT prophylaxis: SCD'd Code Status: DNR  Family Communication: None at bedside Disposition Plan: Possible SNF when cleared by ortho    Consultants:   Ortho Dr. Ophelia Charter  Procedures:   Left Hemiarthroplasty   Antimicrobials:  Diflucan 10/21 OTO  Rocephin 10/21   Objective: Vitals:   08/31/16 1113 08/31/16 1115 08/31/16 1549 08/31/16 1555  BP: (!) 126/56 (!) 141/55 (!) 90/43 (!) 95/40  Pulse:  72 60   Resp:  16 16   Temp:  98 F (36.7 C) 99 F (37.2 C)     TempSrc:  Oral Oral   SpO2:  100% 97%   Weight:      Height:        Intake/Output Summary (Last 24 hours) at 08/31/16 1651 Last data filed at 08/31/16 1549  Gross per 24 hour  Intake           2947.5 ml  Output             2300 ml  Net            647.5 ml   Filed Weights   08/30/16 1139  Weight: 72.6 kg (160 lb)    Examination:  General exam: Appears calm and comfortable  HEENT: AC/AT, PERRLA, OP moist and clear Respiratory system: Clear to auscultation. No wheezes,crackle or rhonchi Cardiovascular system: S1 & S2 heard, RRR. No JVD, murmurs, rubs or gallops Gastrointestinal system: Abdomen is nondistended, soft and nontender. No organomegaly or masses felt.  Central nervous system: Alert and oriented. No focal neurological deficits. Extremities: Left boot in place, toes movement within normal limits, pedal pulses intact  Skin: No rashes, lesions or ulcers Psychiatry: Judgement and insight appear normal. Mood & affect appropriate.    Data Reviewed: I have personally reviewed following labs and imaging studies  CBC:  Recent Labs Lab 08/30/16 1252 08/30/16 1507 08/31/16 1156  WBC 12.0* 15.2* 14.6*  NEUTROABS 9.1*  --   --   HGB 13.6 13.4 12.0  HCT 41.5 40.2 37.4  MCV 92.8 92.6 93.5  PLT  227 209 186   Basic Metabolic Panel:  Recent Labs Lab 08/30/16 1252 08/30/16 1507 08/31/16 1156  NA 136  --   --   K 4.0  --   --   CL 100*  --   --   CO2 26  --   --   GLUCOSE 100*  --   --   BUN 11  --   --   CREATININE 0.82 0.78 0.74  CALCIUM 9.9  --   --    GFR: Estimated Creatinine Clearance: 53.6 mL/min (by C-G formula based on SCr of 0.74 mg/dL). Liver Function Tests: No results for input(s): AST, ALT, ALKPHOS, BILITOT, PROT, ALBUMIN in the last 168 hours. No results for input(s): LIPASE, AMYLASE in the last 168 hours. No results for input(s): AMMONIA in the last 168 hours. Coagulation Profile:  Recent Labs Lab 08/30/16 1252  INR 1.08   Cardiac  Enzymes:  Recent Labs Lab 08/30/16 1252  TROPONINI <0.03   BNP (last 3 results) No results for input(s): PROBNP in the last 8760 hours. HbA1C: No results for input(s): HGBA1C in the last 72 hours. CBG: No results for input(s): GLUCAP in the last 168 hours. Lipid Profile: No results for input(s): CHOL, HDL, LDLCALC, TRIG, CHOLHDL, LDLDIRECT in the last 72 hours. Thyroid Function Tests: No results for input(s): TSH, T4TOTAL, FREET4, T3FREE, THYROIDAB in the last 72 hours. Anemia Panel: No results for input(s): VITAMINB12, FOLATE, FERRITIN, TIBC, IRON, RETICCTPCT in the last 72 hours. Sepsis Labs: No results for input(s): PROCALCITON, LATICACIDVEN in the last 168 hours.  Recent Results (from the past 240 hour(s))  Surgical pcr screen     Status: None   Collection Time: 08/30/16  4:51 PM  Result Value Ref Range Status   MRSA, PCR NEGATIVE NEGATIVE Final   Staphylococcus aureus NEGATIVE NEGATIVE Final    Comment:        The Xpert SA Assay (FDA approved for NASAL specimens in patients over 29 years of age), is one component of a comprehensive surveillance program.  Test performance has been validated by [facility] for patients greater than or equal to 69 year old. It is not intended to diagnose infection nor to guide or monitor treatment.   Culture, Urine     Status: None   Collection Time: 08/30/16  5:12 PM  Result Value Ref Range Status   Specimen Description URINE, CATHETERIZED  Final   Special Requests NONE  Final   Culture NO GROWTH  Final   Report Status 08/31/2016 FINAL  Final         Radiology Studies: Dg Chest 1 View  Result Date: 08/30/2016 CLINICAL DATA:  Fall, left hip fracture EXAM: CHEST 1 VIEW COMPARISON:  08/03/2015 FINDINGS: Stable heart size and vascularity. No focal airspace process, collapse or consolidation. Negative for edema, effusion or pneumothorax. Atherosclerosis noted of the aorta. Advanced degenerative changes of the spine with a  chronic scoliosis. Bones are osteopenic. IMPRESSION: Stable chest exam. No superimposed acute process or interval change. Aortic atherosclerosis Marked scoliosis as before. Electronically Signed   By: Judie Petit.  Shick M.D.   On: 08/30/2016 12:45   Pelvis Portable  Result Date: 08/31/2016 CLINICAL DATA:  Post operative left total hip replacement. EXAM: PORTABLE PELVIS 1-2 VIEWS COMPARISON:  08/30/2016 FINDINGS: Status post left hip hemiarthroplasty. Femoral head component is well seated in the acetabulum. Postoperative gas identified. No interval fractures identified. IMPRESSION: Status post hip arthroplasty.  No adverse features. Electronically Signed   By: Lanora Manis  Manson PasseyBrown M.D.   On: 08/31/2016 10:17   Dg Hip Unilat With Pelvis 2-3 Views Left  Result Date: 08/30/2016 CLINICAL DATA:  Fall, acute right hip injury and pain EXAM: DG HIP (WITH OR WITHOUT PELVIS) 2-3V LEFT COMPARISON:  None available FINDINGS: There is an acute proximal left hip femoral neck fracture with mild displacement and angulation. Femoral head remains located in relation to the left acetabulum. Bones are osteopenic. Bony pelvis and right hip are intact. Degenerative changes present of the lumbar spine and SI joints. Normal bowel gas pattern. Peripheral atherosclerosis noted. IMPRESSION: Acute left hip proximal femoral neck fracture with mild angulation and displacement. Electronically Signed   By: Judie PetitM.  Shick M.D.   On: 08/30/2016 12:44      Scheduled Meds: . aspirin EC  81 mg Oral Daily  . atorvastatin  20 mg Oral Daily  . calcium-vitamin D  1 tablet Oral BID  . cefTRIAXone (ROCEPHIN)  IV  1 g Intravenous Q24H  . cefTRIAXone (ROCEPHIN)  IV  1 g Intravenous To OR  . cholecalciferol  4,000 Units Oral QPM  . citalopram  20 mg Oral Daily  . docusate sodium  100 mg Oral BID  . donepezil  10 mg Oral Daily  . [START ON 09/01/2016] enoxaparin (LOVENOX) injection  40 mg Subcutaneous Q24H  . multivitamin  1 tablet Oral Daily  . nystatin    Topical TID  . omega-3 acid ethyl esters  1 g Oral Daily  . senna-docusate  1 tablet Oral QHS  . timolol  1 drop Both Eyes BID   Continuous Infusions: . dextrose 5 % and 0.45% NaCl 75 mL/hr at 08/31/16 1321     LOS: 1 day    Latrelle DodrillEdwin Silva, MD [facility] Pager [phone]  If 7PM-7AM, please contact night-coverage [email or web address] Password [email or web address] 08/31/2016, 4:51 PM

## 2016-08-31 NOTE — Anesthesia Postprocedure Evaluation (Signed)
Anesthesia Post Note  Patient: Gabrielle CanaryBarbara A [name]  Procedure(s) Performed: Procedure(s) (LRB): ARTHROPLASTY  HIP (HEMIARTHROPLASTY) (Left)  Patient location during evaluation: PACU Anesthesia Type: Spinal Level of consciousness: oriented and awake and alert Pain management: pain level controlled Vital Signs Assessment: post-procedure vital signs reviewed and stable Respiratory status: spontaneous breathing, respiratory function stable and patient connected to nasal cannula oxygen Cardiovascular status: blood pressure returned to baseline and stable Postop Assessment: no headache, no backache, spinal receding and no signs of nausea or vomiting Anesthetic complications: no    Last Vitals:  Vitals:   08/31/16 0936 08/31/16 0945  BP: (!) 97/57 (!) 109/52  Pulse: (!) 51 (!) 52  Resp: 13 13  Temp:      Last Pain:  Vitals:   08/31/16 0945  TempSrc:   PainSc: 0-No pain                 Imanni Burdine,JAMES TERRILL

## 2016-08-31 NOTE — Op Note (Signed)
NAMCyndia Skeeters:  [name], Gabrielle         ACCOUNT NO.:  [id number]  MEDICAL RECORD NO.:  [id number]  LOCATION:  [facility]                         FACILITY:  [facility]  PHYSICIAN:  Hendrix Yurkovich C. Ophelia CharterYates, M.D.    DATE OF BIRTH:  [birth year]  DATE OF PROCEDURE:  08/31/2016 DATE OF DISCHARGE:                              OPERATIVE REPORT   PREOPERATIVE DIAGNOSIS:  Left displaced femoral neck fracture.  POSTOPERATIVE DIAGNOSIS:  Left displaced femoral neck fracture.  PROCEDURE:  Left press-fit monopolar hemiarthroplasty.  SURGEON:  Esaul Dorwart C. Ophelia CharterYates, M.D.  ANESTHESIA:  General.  ESTIMATED BLOOD LOSS:  300 mL.  DRAINS:  None.  BRIEF HISTORY:  An 82 year old female with mild dementia was walking in an Alzheimer's walk cup, felt little bit lightheaded, fell, fractured her left hip with displaced femoral neck fracture.  DESCRIPTION OF PROCEDURE:  After induction of spinal anesthesia and lateral position, standard prepping and draping with left hip up, marked 7 frame, Ancef prophylaxis 2 g, DuraPrep was used.  The patient had some evidence of likely UTI and may have been responsible for some of her symptoms.  She had been on Rocephin started by the hospitalist. DuraPrep was used.  Usual total hip sheets, drapes, impervious stockinette, Coban sterile skin marker and Betadine, Steri-Drape x2 was used to seal the skin.  Time-out was completed.  Posterior approach was made.  Charnley retractor was placed.  Piriformis was tagged and cut. Posterior capsule was opened.  Neck was cut 1 fingerbreadth above the lesser trochanter.  Head was removed with a corkscrew.  Trial sizer showed 49 gave an excellent fit.  Sequential broaching after lateralization up to a #3.  It was noted that the bone was very soft and the patient had known diagnosis of severe osteoporosis.  Trials with +1.5 neck length restored good stability, flexion in 90, internal rotation in 90 without subluxation.  Permanent stem, neck, and ball  were inserted.  Hip was reduced protecting the sciatic nerve again.  Identical findings and stability. Piriformis repaired to gluteus medius.  Ti-Cron in the tensor fascia, 2- 0 Vicryl in subcutaneous tissue, skin staple closure.  No Marcaine was placed and the patient had a Marcaine spinal.  Postop dressing and knee immobilizer were applied.  The patient tolerated the procedure well.     Karisma Meiser C. Ophelia CharterYates, M.D.     MCY/MEDQ  D:  08/31/2016  T:  08/31/2016  Job:  [id number]

## 2016-08-31 NOTE — Anesthesia Procedure Notes (Signed)
Spinal  Patient location during procedure: OR Start time: 08/31/2016 7:55 AM End time: 08/31/2016 8:02 AM Staffing Anesthesiologist: Sharee HolsterMASSAGEE, Xin Klawitter Performed: anesthesiologist  Preanesthetic Checklist Completed: patient identified, site marked, surgical consent, pre-op evaluation, timeout performed, IV checked, risks and benefits discussed and monitors and equipment checked Spinal Block Patient position: left lateral decubitus Prep: ChloraPrep Patient monitoring: heart rate, cardiac monitor, continuous pulse ox and blood pressure Approach: left paramedian Location: L3-4 Injection technique: single-shot Needle Needle type: Quincke  Needle gauge: 25 G Needle length: 9 cm Needle insertion depth: 4 cm Assessment Sensory level: T6 Additional Notes Tolerated well

## 2016-09-01 ENCOUNTER — Encounter (HOSPITAL_COMMUNITY): Payer: Self-pay | Admitting: Orthopaedic Surgery

## 2016-09-01 LAB — CBC
HCT: 36.1 % (ref 36.0–46.0)
Hemoglobin: 11.4 g/dL — ABNORMAL LOW (ref 12.0–15.0)
MCH: 30.1 pg (ref 26.0–34.0)
MCHC: 31.6 g/dL (ref 30.0–36.0)
MCV: 95.3 fL (ref 78.0–100.0)
PLATELETS: 167 10*3/uL (ref 150–400)
RBC: 3.79 MIL/uL — ABNORMAL LOW (ref 3.87–5.11)
RDW: 13.9 % (ref 11.5–15.5)
WBC: 11.7 10*3/uL — ABNORMAL HIGH (ref 4.0–10.5)

## 2016-09-01 LAB — BASIC METABOLIC PANEL
Anion gap: 6 (ref 5–15)
BUN: 8 mg/dL (ref 6–20)
CALCIUM: 9.1 mg/dL (ref 8.9–10.3)
CO2: 29 mmol/L (ref 22–32)
Chloride: 101 mmol/L (ref 101–111)
Creatinine, Ser: 0.78 mg/dL (ref 0.44–1.00)
GFR calc Af Amer: >60 mL/min (ref >60)
GLUCOSE: 98 mg/dL (ref 65–99)
Potassium: 3.9 mmol/L (ref 3.5–5.1)
Sodium: 136 mmol/L (ref 135–145)

## 2016-09-01 LAB — VITAMIN D 25 HYDROXY (VIT D DEFICIENCY, FRACTURES): Vit D, 25-Hydroxy: 45.2 ng/mL (ref 30.0–100.0)

## 2016-09-01 MED ORDER — HYDROCODONE-ACETAMINOPHEN 5-325 MG PO TABS: 1.0000 | ORAL_TABLET | Freq: Four times a day (QID) | ORAL | 0 refills | Status: DC | PRN

## 2016-09-01 MED ORDER — ENSURE ENLIVE PO LIQD
237.0000 mL | Freq: Three times a day (TID) | ORAL | Status: DC
  Administered 2016-09-01 (×2): 237 mL via ORAL

## 2016-09-01 MED ORDER — ENOXAPARIN SODIUM 40 MG/0.4ML ~~LOC~~ SOLN: 40.0000 mg | SUBCUTANEOUS | 28 refills | Status: DC

## 2016-09-01 NOTE — Evaluation (Signed)
Physical Therapy Evaluation Patient Details Name: Gabrielle [name] MRN: [id number] DOB: December 08, 1926 Today's Date: 09/01/2016   History of Present Illness  Pt is an 82 y.o. female who reports that her LLE gave out while she was walking and she fell down. Pt now admitted with Lt femoral neck fx s/p hemiarthroplasty. PMH: mild dementia, glaucoma, Lt THA, Rt TKA.   Clinical Impression  Pt mobilizing slowly during initial PT session, requiring max assistance for bed mobility and transfers. Unable to safety attempt ambulation at this time. Based upon the patient's current mobility, recommending SNF for further rehabilitation following her acute stay. PT to continue to follow and progress as tolerated.     Follow Up Recommendations SNF;Supervision for mobility/OOB    Equipment Recommendations   (to be addressed at next venue)    Recommendations for Other Services       Precautions / Restrictions Precautions Precautions: Posterior Hip;Fall Precaution Booklet Issued: Yes (comment) Precaution Comments: precautions reviewed, handout provided Required Braces or Orthoses: Knee Immobilizer - Left Restrictions Weight Bearing Restrictions: Yes LLE Weight Bearing: Weight bearing as tolerated      Mobility  Bed Mobility Overal bed mobility: Needs Assistance Bed Mobility: Supine to Sit     Supine to sit: Max assist     General bed mobility comments: assist needed at trunk and LEs. Using bed pad to assist with pivot.   Transfers Overall transfer level: Needs assistance Equipment used: Rolling walker (2 wheeled) Transfers: Sit to/from [employer] CorporationStand;Stand Pivot Transfers Sit to Stand: Max assist;From elevated surface Stand pivot transfers: Mod assist       General transfer comment: Attempting to stand X3 with 1 successful attempt. Verbal and manual assist needed for pivot sequence from bed to chair.   Ambulation/Gait             General Gait Details: unable to safety attempt at this  time.   Stairs            Wheelchair Mobility    Modified Rankin (Stroke Patients Only)       Balance Overall balance assessment: Needs assistance Sitting-balance support: No upper extremity supported Sitting balance-Leahy Scale: Fair Sitting balance - Comments: Pt leaning Rt, holding bedrail for support   Standing balance support: Bilateral upper extremity supported Standing balance-Leahy Scale: Poor Standing balance comment: using rw for support                             Pertinent Vitals/Pain Pain Assessment: 0-10 Pain Score: 2  Pain Location: Lt hip Pain Descriptors / Indicators: Sore Pain Intervention(s): Limited activity within patient's tolerance;Monitored during session    Home Living   Living Arrangements: Alone   Type of Home: Independent living facility Home Access: Level entry     Home Layout: One level Home Equipment: None      Prior Function Level of Independence: Independent               Hand Dominance        Extremity/Trunk Assessment   Upper Extremity Assessment: Overall WFL for tasks assessed           Lower Extremity Assessment: LLE deficits/detail   LLE Deficits / Details: assistance needed to move LLE with bed mobility.      Communication   Communication: No difficulties  Cognition Arousal/Alertness: Awake/alert Behavior During Therapy: WFL for tasks assessed/performed Overall Cognitive Status: No family/caregiver present to determine baseline cognitive functioning (oriented to place,  month/year, situation. )                      General Comments      Exercises     Assessment/Plan    PT Assessment Patient needs continued PT services  PT Problem List Decreased strength;Decreased range of motion;Decreased activity tolerance;Decreased balance;Decreased mobility;Decreased knowledge of precautions          PT Treatment Interventions DME instruction;Gait training;Functional mobility  training;Therapeutic activities;Therapeutic exercise;Patient/family education    PT Goals (Current goals can be found in the Care Plan section)  Acute Rehab PT Goals Patient Stated Goal: return home again PT Goal Formulation: With patient Time For Goal Achievement: 09/15/16 Potential to Achieve Goals: Good    Frequency Min 3X/week   Barriers to discharge Decreased caregiver support      Co-evaluation               End of Session Equipment Utilized During Treatment: Gait belt;Left knee immobilizer Activity Tolerance: Patient limited by fatigue;Patient limited by pain Patient left: in chair;with call bell/phone within reach Nurse Communication: Mobility status;Weight bearing status;Precautions         Time: 1610-9604 PT Time Calculation (min) (ACUTE ONLY): 41 min   Charges:   PT Evaluation $PT Eval Moderate Complexity: 1 Procedure PT Treatments $Therapeutic Activity: 23-37 mins   PT G Codes:        Christiane Ha, PT, CSCS Pager [phone] Office [phone]  09/01/2016, 1:12 PM

## 2016-09-01 NOTE — Progress Notes (Signed)
Initial Nutrition Assessment  DOCUMENTATION CODES:   Not applicable  INTERVENTION:  Provide Ensure Enlive po TID, each supplement provides 350 kcal and 20 grams of protein.  Encourage adequate PO intake.   NUTRITION DIAGNOSIS:   Inadequate oral intake related to poor appetite as evidenced by per patient/family report.  GOAL:   Patient will meet greater than or equal to 90% of their needs  MONITOR:   PO intake, Supplement acceptance, Labs, Weight trends, Skin, I & O's  REASON FOR ASSESSMENT:   Consult Hip fracture protocol  ASSESSMENT:   82 y.o. female with history of hyperlipidemia, osteoporosis, depression, arthritis, vascular dementia presented to the ED after fall. She was walking in Alzheimer's walk,  walked about half a mile or so when she was starting to get little bit tired.  All of a sudden, her left leg collapse and she fell to the ground. Patient admitted with left hip fracture. Now s/p left hemiarthroplasty 10/22.  Pt reports having a decreased appetite since admission. Pt reports eating well PTA with usual consumption of at least 3 meals a day with no other difficulties. No percent meal completion recorded. Weight has been stable per weight records. Pt is agreeable to Ensure to aid in caloric and protein needs. RD to order.   Nutrition-Focused physical exam completed. Findings are no fat depletion, moderate muscle depletion, and mild edema.   Labs and medications reviewed.   Diet Order:  Diet Heart Room service appropriate? Yes; Fluid consistency: Thin  Skin:   (Incision on L hip)  Last BM:  10/20  Height:   Ht Readings from Last 1 Encounters:  08/30/16 5\' 10"  (1.778 m)    Weight:   Wt Readings from Last 1 Encounters:  08/30/16 160 lb (72.6 kg)    Ideal Body Weight:  68 kg  BMI:  Body mass index is 22.96 kg/m.  Estimated Nutritional Needs:   Kcal:  1800-1900  Protein:  75-85 grams  Fluid:  1.8 - 1.9 L/day  EDUCATION NEEDS:   No  education needs identified at this time  Roslyn SmilingStephanie Christhoper Busbee, MS, RD, LDN Pager # [phone] After hours/ weekend pager # [phone]

## 2016-09-01 NOTE — Evaluation (Signed)
Occupational Therapy Evaluation Patient Details Name: Gabrielle LeanBarbara A Bublitz MRN: [id number] DOB: Jan 22, 1927 Today's Date: 09/01/2016    History of Present Illness Pt is an 82 y.o. female who reports that her LLE gave out while she was walking and she fell down. Pt now admitted with Lt femoral neck fx s/p hemiarthroplasty. PMH: mild dementia, glaucoma, Lt THA, Rt TKA.    Clinical Impression   Pt with decline in function and safety with ADLs and ADL mobility with decreased strength, balance, endurance and cognition. Pt requires +2 assist for mobility and extensive assist with LB ADLs and toileting. Pt would benefit from acute OT services to address impairments to increase level of function and safety    Follow Up Recommendations  SNF;Supervision/Assistance - 24 hour    Equipment Recommendations  Other (comment) (TBD )    Recommendations for Other Services       Precautions / Restrictions Precautions Precautions: Posterior Hip;Fall Precaution Booklet Issued: Yes (comment) Precaution Comments: pt unable to recall hip precautions. Reviewed precuations with pt Required Braces or Orthoses: Knee Immobilizer - Left Restrictions Weight Bearing Restrictions: Yes LLE Weight Bearing: Weight bearing as tolerated      Mobility Bed Mobility Overal bed mobility: Needs Assistance Bed Mobility: Supine to Sit     Supine to sit: Max assist     General bed mobility comments: pt up in recliner  Transfers Overall transfer level: Needs assistance Equipment used: Rolling walker (2 wheeled) Transfers: Sit to/from [employer]Stand;Stand Pivot Transfers Sit to Stand: Max assist;From elevated surface Stand pivot transfers: Max assist       General transfer comment: Attempting to stand X 4      Balance Overall balance assessment: Needs assistance Sitting-balance support: No upper extremity supported Sitting balance-Leahy Scale: Fair Sitting balance - Comments: Pt leaning Rt, holding bedrail for  support   Standing balance support: Bilateral upper extremity supported Standing balance-Leahy Scale: Poor Standing balance comment: using rw for support                            ADL Overall ADL's : Needs assistance/impaired     Grooming: Wash/dry hands;Wash/dry face;Sitting;Min guard   Upper Body Bathing: Minimal assitance;Sitting   Lower Body Bathing: Maximal assistance   Upper Body Dressing : Minimal assistance;Sitting   Lower Body Dressing: Total assistance   Toilet Transfer: Maximal assistance;Stand-pivot;RW;BSC   Toileting- Clothing Manipulation and Hygiene: Total assistance       Functional mobility during ADLs: Maximal assistance;Cueing for safety;Rolling walker       Vision  hx of glaucoma              Pertinent Vitals/Pain Pain Assessment: 0-10 Pain Score: 4  Pain Location: L hip Pain Descriptors / Indicators: Aching;Sore;Grimacing;Guarding Pain Intervention(s): Limited activity within patient's tolerance;Monitored during session;Repositioned     Hand Dominance Right   Extremity/Trunk Assessment Upper Extremity Assessment Upper Extremity Assessment: Generalized weakness   Lower Extremity Assessment Lower Extremity Assessment: Defer to PT evaluation LLE Deficits / Details: assistance needed to move LLE with bed mobility.        Communication Communication Communication: No difficulties   Cognition Arousal/Alertness: Awake/alert Behavior During Therapy: WFL for tasks assessed/performed Overall Cognitive Status: No family/caregiver present to determine baseline cognitive functioning       Memory: Decreased recall of precautions;Decreased short-term memory             General Comments   pt very pleasant and cooperative  Home Living Family/patient expects to be discharged to:: Other (Comment) [facility]/ALF) Living Arrangements: Other (Comment);Alone (ALF)   Type of Home: Independent  living facility Home Access: Level entry     Home Layout: One level     Bathroom Shower/Tub: Chief Strategy Officer: Handicapped height     Home Equipment: None          Prior Functioning/Environment Level of Independence: Independent                 OT Problem List: Decreased strength;Impaired balance (sitting and/or standing);Decreased cognition;Decreased knowledge of precautions;Pain;Decreased activity tolerance;Decreased knowledge of use of DME or AE;Decreased safety awareness   OT Treatment/Interventions: Self-care/ADL training;DME and/or AE instruction;Therapeutic activities;Patient/family education    OT Goals(Current goals can be found in the care plan section) Acute Rehab OT Goals Patient Stated Goal: return home again OT Goal Formulation: With patient Time For Goal Achievement: 09/08/16 Potential to Achieve Goals: Good ADL Goals Pt Will Perform Grooming: with set-up;sitting Pt Will Perform Upper Body Bathing: with supervision;with min guard assist;sitting Pt Will Perform Lower Body Bathing: with mod assist;sitting/lateral leans Pt Will Perform Upper Body Dressing: with supervision;with min guard assist;sitting Pt Will Transfer to Toilet: with mod assist;stand pivot transfer;bedside commode  OT Frequency: Min 2X/week   Barriers to D/C: Decreased caregiver support                        End of Session Equipment Utilized During Treatment: Left knee immobilizer;Gait belt;Rolling walker;Other (comment) (BSC)  Activity Tolerance: Patient limited by fatigue;Patient limited by pain Patient left: in chair;with chair alarm set;with call bell/phone within reach   Time: 1207-1253 OT Time Calculation (min): 46 min Charges:  OT General Charges $OT Visit: 1 Procedure OT Evaluation $OT Eval Moderate Complexity: 1 Procedure OT Treatments $Self Care/Home Management : 8-22 mins $Therapeutic Activity: 8-22 mins G-Codes:    Galen Manila 09/01/2016, 2:29 PM

## 2016-09-01 NOTE — Clinical Social Work Placement (Signed)
   CLINICAL SOCIAL WORK PLACEMENT  NOTE  Date:  09/01/2016  Patient Details  Name: Gabrielle LeanBarbara A Rostro MRN: [id number] Date of Birth: 09-Jun-1927  Clinical Social Work is seeking post-discharge placement for this patient at the Skilled  Nursing Facility level of care (*CSW will initial, date and re-position this form in  chart as items are completed):  Yes   Patient/family provided with [place] Clinical Social Work Department's list of facilities offering this level of care within the geographic area requested by the patient (or if unable, by the patient's family).  Yes   Patient/family informed of their freedom to choose among providers that offer the needed level of care, that participate in Medicare, Medicaid or managed care program needed by the patient, have an available bed and are willing to accept the patient.  Yes   Patient/family informed of [place]'s ownership interest [facility] and [facility], as well as of the fact that they are under no obligation to receive care at these facilities.  PASRR submitted to EDS on       PASRR number received on       Existing PASRR number confirmed on 09/01/16     FL2 transmitted to all facilities in geographic area requested by pt/family on 09/01/16     FL2 transmitted to all facilities within larger geographic area on       Patient informed that his/her managed care company has contracts with or will negotiate with certain facilities, including the following:            Patient/family informed of bed offers received.  Patient chooses bed at       Physician recommends and patient chooses bed at      Patient to be transferred to   on  .  Patient to be transferred to facility by       Patient family notified on   of transfer.  Name of family member notified:        PHYSICIAN       Additional Comment:    _______________________________________________ Dede QuerySarah Dequincy Born, LCSW 09/01/2016, 9:39 PM

## 2016-09-01 NOTE — Consult Note (Signed)
[facility] CM Primary Care Navigator  09/01/2016  Gabrielle [name] June 01, 1927 [id number]  Met with patient at the bedside to identify possible discharge needs. Patient admits to having episodes of forgetfulness. She resides at [facility] facility. Patient mentioned falling when walking in the field that led to this admission/ surgery.  She confirmed Dr. Cari Caraway with [facility]  as her primary care provider.    Patient shared using [facility] and [employer] [facility] to obtain medications without difficulty. Staff from [facility] assist her in managing her medications per patient.  Georgeann Oppenheim provides transportation to her doctors' appointments and primary caregiver is herself with assistance from staff in the facility when needed. Spoke with OT and confirmed that PT/ OT recommendation was for skilled nursing facility.  Patient voiced understanding to call primary care provider's office, once discharged for a post discharge follow-up appointment within a week or sooner if needs arise.  No further needs or concerns voiced at this time.   For additional questions please contact:  Edwena Felty A. Amarri Michaelson, BSN, RN-BC [facility] PRIMARY CARE Navigator Cell: [phone]

## 2016-09-01 NOTE — [place] FL2 (Signed)
[place] MEDICAID FL2 LEVEL OF CARE SCREENING TOOL     IDENTIFICATION  Patient Name: Gabrielle [name] Birthdate: 11-22-1926 Sex: female Admission Date (Current Location): 08/30/2016  [facility] [place]   Facility and Address:  [facility], [address], [place], [place] [address]      Provider Number: [id number]  Attending Physician Name and Address:  Lenox PondsEdwin Silva Zapata, MD  Relative Name and Phone Number:       Current Level of Care: Hospital Recommended Level of Care: Skilled Nursing Facility Prior Approval Number:    Date Approved/Denied:   PASRR Number: [id number]  Discharge Plan: SNF    Current Diagnoses: Patient Active Problem List   Diagnosis Date Noted  . Hip fracture (HCC) 08/30/2016  . Vascular dementia 08/30/2016  . Osteoporosis 08/30/2016  . Vaginal candida 08/30/2016  . UTI (urinary tract infection) 08/30/2016  . Mild aortic insufficiency: Per 2 d echo 08/04/2015 08/04/2015  . Moderate mitral regurgitation: Per 2 d echo 08/04/2015 08/04/2015  . Glaucoma 08/04/2015  . Orthostasis: Mild 08/04/2015  . Syncope 08/03/2015  . Depression 12/09/2013  . Constipation 11/30/2013  . Pure hypercholesterolemia 11/30/2013  . Acute posthemorrhagic anemia 11/30/2013  . OA (osteoarthritis) of knee 11/21/2013    Orientation RESPIRATION BLADDER Height & Weight     Self, Time, Situation, Place  Normal Continent Weight: 160 lb (72.6 kg) Height:  5\' 10"  (177.8 cm)  BEHAVIORAL SYMPTOMS/MOOD NEUROLOGICAL BOWEL NUTRITION STATUS      Continent Diet (Heart Healthy, Thin Liquids)  AMBULATORY STATUS COMMUNICATION OF NEEDS Skin   Limited Assist Verbally Normal                       Personal Care Assistance Level of Assistance  Bathing, Dressing, Feeding Bathing Assistance: Limited assistance Feeding assistance: Independent Dressing Assistance: Limited assistance     Functional Limitations Info  Sight, Hearing,  Speech Sight Info: Adequate Hearing Info: Adequate Speech Info: Adequate    SPECIAL CARE FACTORS FREQUENCY  PT (By licensed PT), OT (By licensed OT)     PT Frequency: 5 OT Frequency: 5            Contractures Contractures Info: Not present    Additional Factors Info  Code Status, Allergies, Psychotropic Code Status Info: Full Code Allergies Info: No known allergies Psychotropic Info: Medications         Current Medications (09/01/2016):  This is the current hospital active medication list Current Facility-Administered Medications  Medication Dose Route Frequency Provider Last Rate Last Dose  . acetaminophen (TYLENOL) tablet 650 mg  650 mg Oral Q6H PRN Eldred MangesMark C Yates, MD       Or  . acetaminophen (TYLENOL) suppository 650 mg  650 mg Rectal Q6H PRN Eldred MangesMark C Yates, MD      . aspirin EC tablet 81 mg  81 mg Oral Daily Pete Glatterawn T Langeland, MD   81 mg at 09/01/16 1017  . atorvastatin (LIPITOR) tablet 20 mg  20 mg Oral Daily Pete Glatterawn T Langeland, MD   20 mg at 09/01/16 1018  . bisacodyl (DULCOLAX) EC tablet 5 mg  5 mg Oral Daily PRN Pete Glatterawn T Langeland, MD      . calcium-vitamin D (OSCAL WITH D) 500-200 MG-UNIT per tablet 1 tablet  1 tablet Oral BID Pete Glatterawn T Langeland, MD   1 tablet at 09/01/16 1018  . cefTRIAXone (ROCEPHIN) 1 g in dextrose 5 % 50 mL IVPB  1 g  Intravenous Q24H Pete Glatter, MD   1 g at 09/01/16 1445  . cholecalciferol (VITAMIN D) tablet 4,000 Units  4,000 Units Oral QPM Pete Glatter, MD   4,000 Units at 09/01/16 1735  . citalopram (CELEXA) tablet 20 mg  20 mg Oral Daily Pete Glatter, MD   20 mg at 09/01/16 1018  . docusate sodium (COLACE) capsule 100 mg  100 mg Oral BID Eldred Manges, MD   100 mg at 09/01/16 1018  . donepezil (ARICEPT) tablet 10 mg  10 mg Oral Daily Pete Glatter, MD   10 mg at 09/01/16 1018  . enoxaparin (LOVENOX) injection 40 mg  40 mg Subcutaneous Q24H Kendra P Hiatt, RPH   40 mg at 09/01/16 1000  . feeding supplement (ENSURE ENLIVE) (ENSURE  ENLIVE) liquid 237 mL  237 mL Oral TID BM Lenox Ponds, MD   237 mL at 09/01/16 1600  . HYDROcodone-acetaminophen (NORCO/VICODIN) 5-325 MG per tablet 1-2 tablet  1-2 tablet Oral Q6H PRN Pete Glatter, MD   2 tablet at 08/31/16 1925  . HYDROcodone-acetaminophen (NORCO/VICODIN) 5-325 MG per tablet 1-2 tablet  1-2 tablet Oral Q6H PRN Eldred Manges, MD   1 tablet at 09/01/16 1735  . menthol-cetylpyridinium (CEPACOL) lozenge 3 mg  1 lozenge Oral PRN Eldred Manges, MD       Or  . phenol (CHLORASEPTIC) mouth spray 1 spray  1 spray Mouth/Throat PRN Eldred Manges, MD      . metoCLOPramide (REGLAN) tablet 5-10 mg  5-10 mg Oral Q8H PRN Eldred Manges, MD       Or  . metoCLOPramide (REGLAN) injection 5-10 mg  5-10 mg Intravenous Q8H PRN Eldred Manges, MD      . morphine 2 MG/ML injection 2 mg  2 mg Intravenous Q2H PRN Eldred Manges, MD      . multivitamin (PROSIGHT) tablet 1 tablet  1 tablet Oral Daily Pete Glatter, MD   1 tablet at 09/01/16 1017  . nystatin (MYCOSTATIN/NYSTOP) topical powder   Topical TID Pete Glatter, MD      . omega-3 acid ethyl esters (LOVAZA) capsule 1 g  1 g Oral Daily Pete Glatter, MD   1 g at 09/01/16 1018  . ondansetron (ZOFRAN) tablet 4 mg  4 mg Oral Q6H PRN Eldred Manges, MD       Or  . ondansetron [facility]) injection 4 mg  4 mg Intravenous Q6H PRN Eldred Manges, MD      . polyethylene glycol (MIRALAX / GLYCOLAX) packet 17 g  17 g Oral Daily PRN Eldred Manges, MD      . senna-docusate (Senokot-S) tablet 1 tablet  1 tablet Oral QHS Pete Glatter, MD   1 tablet at 08/31/16 2148  . senna-docusate (Senokot-S) tablet 1 tablet  1 tablet Oral QHS PRN Pete Glatter, MD      . timolol (TIMOPTIC) 0.25 % ophthalmic solution 1 drop  1 drop Both Eyes BID Pete Glatter, MD   1 drop at 09/01/16 1000     Discharge Medications: Please see discharge summary for a list of discharge medications.  Relevant Imaging Results:  Relevant Lab Results:   Additional  Information SSN:  [id number]  Dede Query, LCSW

## 2016-09-01 NOTE — Progress Notes (Signed)
PROGRESS NOTE [facility]   Gabrielle LeanBarbara A Dominique   ZOX:[id number] DOB: 1926/11/15  DOA: 08/30/2016 PCP: [name],[name], MD   Brief Narrative:  Gabrielle [name] is a 82 y.o. female with history of hyperlipidemia, osteoporosis, depression, arthritis, vascular dementia presented to the ED after fall. She was walking in Alzheimer's walk,  walked about half a mile or so when she was starting to get little bit tired.  All of a sudden, her left leg collapse and she fell to the ground. Patient admitted with left hip fracture. Now s/p left hemiarthroplasty 10/22.  Subjective: Feeling well, no complaints this morning, using incentive spirometer. Denies CP SOB and dizziness   Assessment & Plan:   Acute left hip proximal femoral neck fracture with mild angulation and displacement  secondary to mechanical fall. S/p Left hemiarthroplasty 10/22 -Management per Ortho  -pain control PRN  -PT recommended SNF  Low BP - Improved - responded to LR, no signs of shock, recent surgery, could be related to anesthesia vs acute blood loss during surgery - no signs of bleeding  -D/c IVF, patient eating well   Osteoporosis - nonadherent to medications recommend in the past. Now with home health aide, has medication assistance with calcium and vitamin D supplements. -Outpatient follow up  Vascular dementia -  Stable  Vaginal candida infection - diflucan x 1, nystatin powder   DVT prophylaxis: SCD'd Code Status: DNR  Family Communication: None at bedside Disposition Plan: Possible SNF when cleared by ortho    Consultants:   Ortho Dr. Ophelia CharterYates  Procedures:   Left Hemiarthroplasty   Antimicrobials:  Diflucan 10/21 OTO  Rocephin 10/21   Objective: Vitals:   08/31/16 1702 08/31/16 2045 09/01/16 0100 09/01/16 0425  BP: (!) 90/48 (!) 102/43 (!) 91/37 (!) 117/43  Pulse: (!) 58 65 62 63  Resp:  16 16 16   Temp:  98.8 F (37.1 C) 98.9 F (37.2 C) 99 F (37.2 C)  TempSrc:  Oral Oral  Oral  SpO2:  97% 99% 99%  Weight:      Height:        Intake/Output Summary (Last 24 hours) at 09/01/16 0908 Last data filed at 09/01/16 0320  Gross per 24 hour  Intake          1021.67 ml  Output             1300 ml  Net          -278.33 ml   Filed Weights   08/30/16 1139  Weight: 72.6 kg (160 lb)    Examination:  General exam: Appears calm and comfortable  Respiratory system: Clear to auscultation. No wheezes,crackle or rhonchi Cardiovascular system: S1 & S2 heard, RRR. No JVD, murmurs, rubs or gallops Gastrointestinal system: Abdomen is nondistended, soft and nontender. No organomegaly or masses felt.  Central nervous system: Alert and oriented. No focal neurological deficits. Extremities: Left boot in place, toes movement within normal limits, pedal pulses intact   Data Reviewed: I have personally reviewed following labs and imaging studies  CBC:  Recent Labs Lab 08/30/16 1252 08/30/16 1507 08/31/16 1156 08/31/16 1823 09/01/16 0439  WBC 12.0* 15.2* 14.6* 11.0* 11.7*  NEUTROABS 9.1*  --   --   --   --   HGB 13.6 13.4 12.0 11.1* 11.4*  HCT 41.5 40.2 37.4 34.4* 36.1  MCV 92.8 92.6 93.5 93.0 95.3  PLT 227 209 186 185 167   Basic Metabolic Panel:  Recent Labs Lab 08/30/16 1252 08/30/16 1507 08/31/16 1156  09/01/16 0439  NA 136  --   --  136  K 4.0  --   --  3.9  CL 100*  --   --  101  CO2 26  --   --  29  GLUCOSE 100*  --   --  98  BUN 11  --   --  8  CREATININE 0.82 0.78 0.74 0.78  CALCIUM 9.9  --   --  9.1   GFR: Estimated Creatinine Clearance: 53.6 mL/min (by C-G formula based on SCr of 0.78 mg/dL). Liver Function Tests: No results for input(s): AST, ALT, ALKPHOS, BILITOT, PROT, ALBUMIN in the last 168 hours. No results for input(s): LIPASE, AMYLASE in the last 168 hours. No results for input(s): AMMONIA in the last 168 hours. Coagulation Profile:  Recent Labs Lab 08/30/16 1252  INR 1.08   Cardiac Enzymes:  Recent Labs Lab 08/30/16 1252    TROPONINI <0.03   BNP (last 3 results) No results for input(s): PROBNP in the last 8760 hours. HbA1C: No results for input(s): HGBA1C in the last 72 hours. CBG: No results for input(s): GLUCAP in the last 168 hours. Lipid Profile: No results for input(s): CHOL, HDL, LDLCALC, TRIG, CHOLHDL, LDLDIRECT in the last 72 hours. Thyroid Function Tests: No results for input(s): TSH, T4TOTAL, FREET4, T3FREE, THYROIDAB in the last 72 hours. Anemia Panel: No results for input(s): VITAMINB12, FOLATE, FERRITIN, TIBC, IRON, RETICCTPCT in the last 72 hours. Sepsis Labs: No results for input(s): PROCALCITON, LATICACIDVEN in the last 168 hours.  Recent Results (from the past 240 hour(s))  Surgical pcr screen     Status: None   Collection Time: 08/30/16  4:51 PM  Result Value Ref Range Status   MRSA, PCR NEGATIVE NEGATIVE Final   Staphylococcus aureus NEGATIVE NEGATIVE Final    Comment:        The Xpert SA Assay (FDA approved for NASAL specimens in patients over 30 years of age), is one component of a comprehensive surveillance program.  Test performance has been validated by [facility] for patients greater than or equal to 51 year old. It is not intended to diagnose infection nor to guide or monitor treatment.   Culture, Urine     Status: None   Collection Time: 08/30/16  5:12 PM  Result Value Ref Range Status   Specimen Description URINE, CATHETERIZED  Final   Special Requests NONE  Final   Culture NO GROWTH  Final   Report Status 08/31/2016 FINAL  Final         Radiology Studies: Dg Chest 1 View  Result Date: 08/30/2016 CLINICAL DATA:  Fall, left hip fracture EXAM: CHEST 1 VIEW COMPARISON:  08/03/2015 FINDINGS: Stable heart size and vascularity. No focal airspace process, collapse or consolidation. Negative for edema, effusion or pneumothorax. Atherosclerosis noted of the aorta. Advanced degenerative changes of the spine with a chronic scoliosis. Bones are osteopenic.  IMPRESSION: Stable chest exam. No superimposed acute process or interval change. Aortic atherosclerosis Marked scoliosis as before. Electronically Signed   By: Judie Petit.  Shick M.D.   On: 08/30/2016 12:45   Pelvis Portable  Result Date: 08/31/2016 CLINICAL DATA:  Post operative left total hip replacement. EXAM: PORTABLE PELVIS 1-2 VIEWS COMPARISON:  08/30/2016 FINDINGS: Status post left hip hemiarthroplasty. Femoral head component is well seated in the acetabulum. Postoperative gas identified. No interval fractures identified. IMPRESSION: Status post hip arthroplasty.  No adverse features. Electronically Signed   By: Norva Pavlov M.D.   On: 08/31/2016 10:17  Dg Hip Unilat With Pelvis 2-3 Views Left  Result Date: 08/30/2016 CLINICAL DATA:  Fall, acute right hip injury and pain EXAM: DG HIP (WITH OR WITHOUT PELVIS) 2-3V LEFT COMPARISON:  None available FINDINGS: There is an acute proximal left hip femoral neck fracture with mild displacement and angulation. Femoral head remains located in relation to the left acetabulum. Bones are osteopenic. Bony pelvis and right hip are intact. Degenerative changes present of the lumbar spine and SI joints. Normal bowel gas pattern. Peripheral atherosclerosis noted. IMPRESSION: Acute left hip proximal femoral neck fracture with mild angulation and displacement. Electronically Signed   By: Judie Petit.  Shick M.D.   On: 08/30/2016 12:44      Scheduled Meds: . aspirin EC  81 mg Oral Daily  . atorvastatin  20 mg Oral Daily  . calcium-vitamin D  1 tablet Oral BID  . cefTRIAXone (ROCEPHIN)  IV  1 g Intravenous Q24H  . cholecalciferol  4,000 Units Oral QPM  . citalopram  20 mg Oral Daily  . docusate sodium  100 mg Oral BID  . donepezil  10 mg Oral Daily  . enoxaparin (LOVENOX) injection  40 mg Subcutaneous Q24H  . multivitamin  1 tablet Oral Daily  . nystatin   Topical TID  . omega-3 acid ethyl esters  1 g Oral Daily  . senna-docusate  1 tablet Oral QHS  . timolol  1 drop  Both Eyes BID   Continuous Infusions: . lactated ringers 100 mL/hr (09/01/16 0657)     LOS: 2 days    Latrelle Dodrill, MD [facility] Pager [phone]  If 7PM-7AM, please contact night-coverage [email or web address] Password [email or web address] 09/01/2016, 9:08 AM

## 2016-09-01 NOTE — Clinical Social Work Note (Signed)
Clinical Social Work Assessment  Patient Details  Name: Gabrielle [name] MRN: [id number] Date of Birth: September 02, 1927  Date of referral:  09/01/16               Reason for consult:  Facility Placement                Permission sought to share information with:  Family Supports Permission granted to share information::  Yes, Verbal Permission Granted  Name::     Gabrielle [name]  Relationship::  son  Contact Information:  [phone]  Housing/Transportation Living arrangements for the past 2 months:  [place] of Information:  Adult Children Patient Interpreter Needed:  None Criminal Activity/Legal Involvement Pertinent to Current Situation/Hospitalization:  No - Comment as needed Significant Relationships:   Adult children Lives with:  Facility Resident Do you feel safe going back to the place where you live?  Yes Need for family participation in patient care:  Yes (Comment)  Care giving concerns:  No care giving concerns identified.   Social Worker assessment / plan:  CSW met with pt and family to address consult for New SNF. CSW introduced herself and explained role of social work. CSW also explained process of discharging to SNF with Medicare. Pt has been to [facility] in the past. PT is recommending SNF. CSW will initiated SNF search and follow up with bed offers. CSW will continue to follow.    Employment status:  Retired Forensic scientist:  Medicare PT Recommendations:  [place] / Referral to community resources:  [place]  Patient/Family's Response to care:  Pt and family were appreciative of CSW support.   Patient/Family's Understanding of and Emotional Response to Diagnosis, Current Treatment, and Prognosis:  Pt and family understand that pt would benefit from STR at [facility].   Emotional Assessment Appearance:  Appears stated age Attitude/Demeanor/Rapport:   (appropriate) Affect (typically  observed):  Pleasant Orientation:  Oriented to Self, Oriented to Place, Oriented to  Time, Oriented to Situation Alcohol / Substance use:  Never Used Psych involvement (Current and /or in the community):  No (Comment)  Discharge Needs  Concerns to be addressed:  Adjustment to Illness Readmission within the last 30 days:  No Current discharge risk:  Chronically ill Barriers to Discharge:  Continued Medical Work up   [employer], LCSW 09/01/2016, 9:23 PM

## 2016-09-01 NOTE — Progress Notes (Signed)
Subjective: Doing well.  Pain controlled.     Objective: Vital signs in last 24 hours: Temp:  [97.5 F (36.4 C)-99 F (37.2 C)] 99 F (37.2 C) (10/23 0425) Pulse Rate:  [58-72] 63 (10/23 0425) Resp:  [16] 16 (10/23 0425) BP: (90-141)/(37-56) 117/43 (10/23 0425) SpO2:  [97 %-100 %] 99 % (10/23 0425)  Intake/Output from previous day: 10/22 0701 - 10/23 0700 In: 2821.7 [I.V.:2821.7] Out: 1700 [Urine:1500; Blood:200] Intake/Output this shift: No intake/output data recorded.   Recent Labs  08/30/16 1252 08/30/16 1507 08/31/16 1156 08/31/16 1823 09/01/16 0439  HGB 13.6 13.4 12.0 11.1* 11.4*    Recent Labs  08/31/16 1823 09/01/16 0439  WBC 11.0* 11.7*  RBC 3.70* 3.79*  HCT 34.4* 36.1  PLT 185 167    Recent Labs  08/30/16 1252  08/31/16 1156 09/01/16 0439  NA 136  --   --  136  K 4.0  --   --  3.9  CL 100*  --   --  101  CO2 26  --   --  29  BUN 11  --   --  8  CREATININE 0.82  < > 0.74 0.78  GLUCOSE 100*  --   --  98  CALCIUM 9.9  --   --  9.1  < > = values in this interval not displayed.  Recent Labs  08/30/16 1252  INR 1.08    Exam:  Pleasant wf in NAD.  Dressing c/d/i.  bilat calves nontender, NVI.    Assessment/Plan: Up with PT.  Will need snf for rehab.     Zonia KiefJames Gibbs Naugle 09/01/2016, 10:24 AM

## 2016-09-02 DIAGNOSIS — M84459D Pathological fracture, hip, unspecified, subsequent encounter for fracture with routine healing: Secondary | ICD-10-CM | POA: Diagnosis not present

## 2016-09-02 DIAGNOSIS — F325 Major depressive disorder, single episode, in full remission: Secondary | ICD-10-CM | POA: Diagnosis not present

## 2016-09-02 DIAGNOSIS — H409 Unspecified glaucoma: Secondary | ICD-10-CM | POA: Diagnosis not present

## 2016-09-02 DIAGNOSIS — D649 Anemia, unspecified: Secondary | ICD-10-CM | POA: Diagnosis not present

## 2016-09-02 DIAGNOSIS — Z4789 Encounter for other orthopedic aftercare: Secondary | ICD-10-CM | POA: Diagnosis not present

## 2016-09-02 DIAGNOSIS — R2689 Other abnormalities of gait and mobility: Secondary | ICD-10-CM | POA: Diagnosis not present

## 2016-09-02 DIAGNOSIS — S72002D Fracture of unspecified part of neck of left femur, subsequent encounter for closed fracture with routine healing: Secondary | ICD-10-CM | POA: Diagnosis not present

## 2016-09-02 DIAGNOSIS — M150 Primary generalized (osteo)arthritis: Secondary | ICD-10-CM | POA: Diagnosis not present

## 2016-09-02 DIAGNOSIS — Z9181 History of falling: Secondary | ICD-10-CM | POA: Diagnosis not present

## 2016-09-02 DIAGNOSIS — F0390 Unspecified dementia without behavioral disturbance: Secondary | ICD-10-CM | POA: Diagnosis not present

## 2016-09-02 DIAGNOSIS — K5900 Constipation, unspecified: Secondary | ICD-10-CM | POA: Diagnosis not present

## 2016-09-02 DIAGNOSIS — S728X9A Other fracture of unspecified femur, initial encounter for closed fracture: Secondary | ICD-10-CM | POA: Diagnosis not present

## 2016-09-02 DIAGNOSIS — R262 Difficulty in walking, not elsewhere classified: Secondary | ICD-10-CM | POA: Diagnosis not present

## 2016-09-02 DIAGNOSIS — S72001D Fracture of unspecified part of neck of right femur, subsequent encounter for closed fracture with routine healing: Secondary | ICD-10-CM | POA: Diagnosis not present

## 2016-09-02 DIAGNOSIS — M6281 Muscle weakness (generalized): Secondary | ICD-10-CM | POA: Diagnosis not present

## 2016-09-02 DIAGNOSIS — R55 Syncope and collapse: Secondary | ICD-10-CM | POA: Diagnosis not present

## 2016-09-02 DIAGNOSIS — R278 Other lack of coordination: Secondary | ICD-10-CM | POA: Diagnosis not present

## 2016-09-02 DIAGNOSIS — M818 Other osteoporosis without current pathological fracture: Secondary | ICD-10-CM | POA: Diagnosis not present

## 2016-09-02 DIAGNOSIS — R41841 Cognitive communication deficit: Secondary | ICD-10-CM | POA: Diagnosis not present

## 2016-09-02 DIAGNOSIS — R52 Pain, unspecified: Secondary | ICD-10-CM | POA: Diagnosis not present

## 2016-09-02 DIAGNOSIS — Z96641 Presence of right artificial hip joint: Secondary | ICD-10-CM | POA: Diagnosis not present

## 2016-09-02 DIAGNOSIS — S72002A Fracture of unspecified part of neck of left femur, initial encounter for closed fracture: Secondary | ICD-10-CM | POA: Diagnosis not present

## 2016-09-02 DIAGNOSIS — B373 Candidiasis of vulva and vagina: Secondary | ICD-10-CM | POA: Diagnosis not present

## 2016-09-02 DIAGNOSIS — M20012 Mallet finger of left finger(s): Secondary | ICD-10-CM | POA: Diagnosis not present

## 2016-09-02 DIAGNOSIS — R601 Generalized edema: Secondary | ICD-10-CM | POA: Diagnosis not present

## 2016-09-02 DIAGNOSIS — E785 Hyperlipidemia, unspecified: Secondary | ICD-10-CM | POA: Diagnosis not present

## 2016-09-02 DIAGNOSIS — F0150 Vascular dementia without behavioral disturbance: Secondary | ICD-10-CM | POA: Diagnosis not present

## 2016-09-02 LAB — BASIC METABOLIC PANEL
Anion gap: 7 (ref 5–15)
BUN: 8 mg/dL (ref 6–20)
CO2: 32 mmol/L (ref 22–32)
CREATININE: 0.67 mg/dL (ref 0.44–1.00)
Calcium: 8.9 mg/dL (ref 8.9–10.3)
Chloride: 97 mmol/L — ABNORMAL LOW (ref 101–111)
GFR calc Af Amer: >60 mL/min (ref >60)
Glucose, Bld: 98 mg/dL (ref 65–99)
Potassium: 3.8 mmol/L (ref 3.5–5.1)
SODIUM: 136 mmol/L (ref 135–145)

## 2016-09-02 LAB — CBC
HCT: 32.2 % — ABNORMAL LOW (ref 36.0–46.0)
Hemoglobin: 10.1 g/dL — ABNORMAL LOW (ref 12.0–15.0)
MCH: 29.9 pg (ref 26.0–34.0)
MCHC: 31.4 g/dL (ref 30.0–36.0)
MCV: 95.3 fL (ref 78.0–100.0)
PLATELETS: 157 10*3/uL (ref 150–400)
RBC: 3.38 MIL/uL — ABNORMAL LOW (ref 3.87–5.11)
RDW: 13.8 % (ref 11.5–15.5)
WBC: 12.4 10*3/uL — ABNORMAL HIGH (ref 4.0–10.5)

## 2016-09-02 MED ORDER — BISACODYL 5 MG PO TBEC: 5.0000 mg | DELAYED_RELEASE_TABLET | Freq: Every day | ORAL | 0 refills | Status: AC | PRN

## 2016-09-02 NOTE — Progress Notes (Addendum)
SW met with son and patient at bedside. SW provided them with information for facilities who have offered the pt a bed.   Son chooses [facility]. SW reached out to facility and and spoke with admissions. She states that the pt is welcomed to come upon discharge. SW made son aware. SW made physician aware; he states that he will determine upon seeing the pt later this afternoon if the pt is clear to discharge today. SW will continue to follow up.   Tilda Burrow, MSW [phone] 09/02/2016 12:12 PM

## 2016-09-02 NOTE — Progress Notes (Signed)
[facility] Pt transported via [facility] with all personal belongings. Family support, Pt demonstrates no s/sx of distress.

## 2016-09-02 NOTE — Progress Notes (Signed)
   Subjective: 2 Days Post-Op Procedure(s) (LRB): ARTHROPLASTY  HIP (HEMIARTHROPLASTY) (Left) Patient reports pain as mild.    Objective: Vital signs in last 24 hours: Temp:  [97 F (36.1 C)-98 F (36.7 C)] 98 F (36.7 C) (10/24 0445) Pulse Rate:  [61-72] 61 (10/24 0445) Resp:  [16] 16 (10/24 0445) BP: (100-125)/(44-72) 108/53 (10/24 0445) SpO2:  [94 %-100 %] 100 % (10/24 0445)  Intake/Output from previous day: 10/23 0701 - 10/24 0700 In: 420 [P.O.:420] Out: -  Intake/Output this shift: No intake/output data recorded.   Recent Labs  08/30/16 1507 08/31/16 1156 08/31/16 1823 09/01/16 0439 09/02/16 0646  HGB 13.4 12.0 11.1* 11.4* 10.1*    Recent Labs  09/01/16 0439 09/02/16 0646  WBC 11.7* 12.4*  RBC 3.79* 3.38*  HCT 36.1 32.2*  PLT 167 157    Recent Labs  09/01/16 0439 09/02/16 0646  NA 136 136  K 3.9 3.8  CL 101 97*  CO2 29 32  BUN 8 8  CREATININE 0.78 0.67  GLUCOSE 98 98  CALCIUM 9.1 8.9    Recent Labs  08/30/16 1252  INR 1.08    Neurologically intact  Assessment/Plan: 2 Days Post-Op Procedure(s) (LRB): ARTHROPLASTY  HIP (HEMIARTHROPLASTY) (Left) Up with therapy, has minimal pain. Likely SNF for a week or so then home. Posterior hip precautions. WBAT  Eldred MangesMark C Avalina Benko,  09/02/2016, 8:16 AM

## 2016-09-02 NOTE — Progress Notes (Signed)
SW consulted with nurse who states that she has spoken with physician. She states that she will be speaking with him within the next 15 to determine discharge date.  SW will continue to follow up.   Gildardo PoundsBrittney Whtiaker, MSW  09/02/2016 3:01 PM

## 2016-09-02 NOTE — Discharge Summary (Signed)
Physician Discharge Summary  Gabrielle [name]  ZOX:[id number]  DOB: 1927/07/29  DOA: 08/30/2016 PCP: [name],[name], MD  Admit date: 08/30/2016 Discharge date: 09/02/2016  Admitted From: Home  Disposition:  SNF   Recommendations for Outpatient Follow-up:  1. Follow up with PCP in 1-2 weeks 2. Please obtain BMP/CBC in one week 3. PT   Home Health: No Equipment/Devices: None   Discharge Condition: Stable  CODE STATUS: Full  Diet recommendation: Heart Healthy   Brief/Interim Summary: Gabrielle MccreedyBarbara A Houseknechtis a 82 y.o.femalewith history of hyperlipidemia, osteoporosis, depression, arthritis, vascular dementia presented to the ED after fall. She was walking in Alzheimer's walk, walkedabout half a mile or so when she was starting to get little bit tired. All of a sudden,her left leg collapse and she fell to the ground. Patient admitted with left hip fracture. Now s/p left hemiarthroplasty 10/22.  Subjective: Patient seen and examined at bedside, feeling well no complaints this morning, saturating well on RA No acute events. Ortho cleared for discharge.  Discharge Diagnoses:   Acute left hip proximal femoral neck fracture with mild angulation and displacement  secondary to mechanical fall. S/p Left hemiarthroplasty 10/22 -Management per Ortho  -pain control PRN  -SNF for PT   Osteoporosis - nonadherent to medications recommend in the past. Now with home health aide, has medication assistance with calcium and vitamin D supplements. -Outpatient follow up  Vascular dementia -  Stable  Vaginal candida infection - diflucan x 1, nystatin powder  Discharge Instructions  Discharge Instructions    Call MD for:  difficulty breathing, headache or visual disturbances    Complete by:  As directed    Call MD for:  extreme fatigue    Complete by:  As directed    Call MD for:  hives    Complete by:  As directed    Call MD for:  persistant dizziness or light-headedness     Complete by:  As directed    Call MD for:  persistant nausea and vomiting    Complete by:  As directed    Call MD for:  redness, tenderness, or signs of infection (pain, swelling, redness, odor or green/yellow discharge around incision site)    Complete by:  As directed    Call MD for:  severe uncontrolled pain    Complete by:  As directed    Call MD for:  temperature >100.4    Complete by:  As directed    Diet - low sodium heart healthy    Complete by:  As directed    Discharge instructions    Complete by:  As directed    Discharge instructions    Complete by:  As directed    Posterior hip precautions.  WBAT PT/OT   Increase activity slowly    Complete by:  As directed        Medication List    TAKE these medications   aspirin 81 MG tablet Take 81 mg by mouth at bedtime.   atorvastatin 20 MG tablet Commonly known as:  LIPITOR Take 20 mg by mouth at bedtime.   bisacodyl 5 MG EC tablet Commonly known as:  DULCOLAX Take 1 tablet (5 mg total) by mouth daily as needed for moderate constipation.   CALCIUM 600 + D PO Take 1 tablet by mouth 2 (two) times daily.   citalopram 40 MG tablet Commonly known as:  CELEXA Take 40 mg by mouth at bedtime.   donepezil 10 MG tablet Commonly known as:  ARICEPT  Take 10 mg by mouth at bedtime.   enoxaparin 40 MG/0.4ML injection Commonly known as:  LOVENOX Inject 0.4 mLs (40 mg total) into the skin daily.   Fish Oil 1000 MG Caps Take 1,000 mg by mouth at bedtime.   HYDROcodone-acetaminophen 5-325 MG tablet Commonly known as:  NORCO Take 1 tablet by mouth every 6 (six) hours as needed for moderate pain.   ICAPS MV Tabs Take 1 tablet by mouth at bedtime.   memantine 5 MG tablet Commonly known as:  NAMENDA Take 5 mg by mouth at bedtime.   senna-docusate 8.6-50 MG tablet Commonly known as:  Senokot-S Take 1 tablet by mouth at bedtime.   timolol 0.25 % ophthalmic solution Commonly known as:  BETIMOL Place 1 drop into both  eyes 2 (two) times daily.   Vitamin D 2000 units Caps Take 4,000 Units by mouth at bedtime.      Follow-up Information    Eldred Manges, MD. Schedule an appointment as soon as possible for a visit today.   Specialty:  Orthopedic Surgery Why:  need return office visit 2 weeks postop.   Contact information: [address] [place] [address] [phone]        Gweneth Dimitri, MD .   Specialty:  [facility] Medicine Contact information: [address] [place] [address] [phone]          No Known Allergies  Consultations:  Ortho    Procedures/Studies: Dg Chest 1 View  Result Date: 08/30/2016 CLINICAL DATA:  Fall, left hip fracture EXAM: CHEST 1 VIEW COMPARISON:  08/03/2015 FINDINGS: Stable heart size and vascularity. No focal airspace process, collapse or consolidation. Negative for edema, effusion or pneumothorax. Atherosclerosis noted of the aorta. Advanced degenerative changes of the spine with a chronic scoliosis. Bones are osteopenic. IMPRESSION: Stable chest exam. No superimposed acute process or interval change. Aortic atherosclerosis Marked scoliosis as before. Electronically Signed   By: Judie Petit.  Shick M.D.   On: 08/30/2016 12:45   Pelvis Portable  Result Date: 08/31/2016 CLINICAL DATA:  Post operative left total hip replacement. EXAM: PORTABLE PELVIS 1-2 VIEWS COMPARISON:  08/30/2016 FINDINGS: Status post left hip hemiarthroplasty. Femoral head component is well seated in the acetabulum. Postoperative gas identified. No interval fractures identified. IMPRESSION: Status post hip arthroplasty.  No adverse features. Electronically Signed   By: Norva Pavlov M.D.   On: 08/31/2016 10:17   Dg Hip Unilat With Pelvis 2-3 Views Left  Result Date: 08/30/2016 CLINICAL DATA:  Fall, acute right hip injury and pain EXAM: DG HIP (WITH OR WITHOUT PELVIS) 2-3V LEFT COMPARISON:  None available FINDINGS: There is an acute proximal left hip femoral neck fracture  with mild displacement and angulation. Femoral head remains located in relation to the left acetabulum. Bones are osteopenic. Bony pelvis and right hip are intact. Degenerative changes present of the lumbar spine and SI joints. Normal bowel gas pattern. Peripheral atherosclerosis noted. IMPRESSION: Acute left hip proximal femoral neck fracture with mild angulation and displacement. Electronically Signed   By: Judie Petit.  Shick M.D.   On: 08/30/2016 12:44      Discharge Exam: Vitals:   09/02/16 0800 09/02/16 1242  BP: (!) 96/37 (!) 123/49  Pulse: (!) 58 65  Resp:  16  Temp: 98.2 F (36.8 C) 98.6 F (37 C)   Vitals:   09/02/16 0445 09/02/16 0800 09/02/16 1200 09/02/16 1242  BP: (!) 108/53 (!) 96/37  (!) 123/49  Pulse: 61 (!) 58  65  Resp: 16  16  Temp: 98 F (36.7 C) 98.2 F (36.8 C)  98.6 F (37 C)  TempSrc: Oral Oral  Oral  SpO2: 100% 96% 98% 100%  Weight:      Height:        General: Pt is alert, awake, not in acute distress Cardiovascular: RRR, S1/S2 +, no rubs, no gallops Respiratory: CTA bilaterally, no wheezing, no rhonchi Abdominal: Soft, NT, ND, bowel sounds + Extremities: no edema, no cyanosis    The results of significant diagnostics from this hospitalization (including imaging, microbiology, ancillary and laboratory) are listed below for reference.     Microbiology: Recent Results (from the past 240 hour(s))  Surgical pcr screen     Status: None   Collection Time: 08/30/16  4:51 PM  Result Value Ref Range Status   MRSA, PCR NEGATIVE NEGATIVE Final   Staphylococcus aureus NEGATIVE NEGATIVE Final    Comment:        The Xpert SA Assay (FDA approved for NASAL specimens in patients over 66 years of age), is one component of a comprehensive surveillance program.  Test performance has been validated by [facility] for patients greater than or equal to 17 year old. It is not intended to diagnose infection nor to guide or monitor treatment.   Culture, Urine      Status: None   Collection Time: 08/30/16  5:12 PM  Result Value Ref Range Status   Specimen Description URINE, CATHETERIZED  Final   Special Requests NONE  Final   Culture NO GROWTH  Final   Report Status 08/31/2016 FINAL  Final     Labs: BNP (last 3 results) No results for input(s): BNP in the last 8760 hours. Basic Metabolic Panel:  Recent Labs Lab 08/30/16 1252 08/30/16 1507 08/31/16 1156 09/01/16 0439 09/02/16 0646  NA 136  --   --  136 136  K 4.0  --   --  3.9 3.8  CL 100*  --   --  101 97*  CO2 26  --   --  29 32  GLUCOSE 100*  --   --  98 98  BUN 11  --   --  8 8  CREATININE 0.82 0.78 0.74 0.78 0.67  CALCIUM 9.9  --   --  9.1 8.9   Liver Function Tests: No results for input(s): AST, ALT, ALKPHOS, BILITOT, PROT, ALBUMIN in the last 168 hours. No results for input(s): LIPASE, AMYLASE in the last 168 hours. No results for input(s): AMMONIA in the last 168 hours. CBC:  Recent Labs Lab 08/30/16 1252 08/30/16 1507 08/31/16 1156 08/31/16 1823 09/01/16 0439 09/02/16 0646  WBC 12.0* 15.2* 14.6* 11.0* 11.7* 12.4*  NEUTROABS 9.1*  --   --   --   --   --   HGB 13.6 13.4 12.0 11.1* 11.4* 10.1*  HCT 41.5 40.2 37.4 34.4* 36.1 32.2*  MCV 92.8 92.6 93.5 93.0 95.3 95.3  PLT 227 209 186 185 167 157   Cardiac Enzymes:  Recent Labs Lab 08/30/16 1252  TROPONINI <0.03   BNP: Invalid input(s): POCBNP CBG: No results for input(s): GLUCAP in the last 168 hours. D-Dimer No results for input(s): DDIMER in the last 72 hours. Hgb A1c No results for input(s): HGBA1C in the last 72 hours. Lipid Profile No results for input(s): CHOL, HDL, LDLCALC, TRIG, CHOLHDL, LDLDIRECT in the last 72 hours. Thyroid function studies No results for input(s): TSH, T4TOTAL, T3FREE, THYROIDAB in the last 72 hours.  Invalid input(s): FREET3 Anemia  work up No results for input(s): VITAMINB12, FOLATE, FERRITIN, TIBC, IRON, RETICCTPCT in the last 72 hours. Urinalysis    Component Value  Date/Time   COLORURINE YELLOW 08/30/2016 1317   APPEARANCEUR CLOUDY (A) 08/30/2016 1317   LABSPEC 1.010 08/30/2016 1317   PHURINE 8.5 (H) 08/30/2016 1317   GLUCOSEU NEGATIVE 08/30/2016 1317   HGBUR NEGATIVE 08/30/2016 1317   BILIRUBINUR NEGATIVE 08/30/2016 1317   KETONESUR 15 (A) 08/30/2016 1317   PROTEINUR NEGATIVE 08/30/2016 1317   UROBILINOGEN 0.2 08/03/2015 1540   NITRITE NEGATIVE 08/30/2016 1317   LEUKOCYTESUR TRACE (A) 08/30/2016 1317   Sepsis Labs Invalid input(s): PROCALCITONIN,  WBC,  LACTICIDVEN Microbiology Recent Results (from the past 240 hour(s))  Surgical pcr screen     Status: None   Collection Time: 08/30/16  4:51 PM  Result Value Ref Range Status   MRSA, PCR NEGATIVE NEGATIVE Final   Staphylococcus aureus NEGATIVE NEGATIVE Final    Comment:        The Xpert SA Assay (FDA approved for NASAL specimens in patients over 65 years of age), is one component of a comprehensive surveillance program.  Test performance has been validated by [facility] for patients greater than or equal to 44 year old. It is not intended to diagnose infection nor to guide or monitor treatment.   Culture, Urine     Status: None   Collection Time: 08/30/16  5:12 PM  Result Value Ref Range Status   Specimen Description URINE, CATHETERIZED  Final   Special Requests NONE  Final   Culture NO GROWTH  Final   Report Status 08/31/2016 FINAL  Final     Time coordinating discharge: Over 30 minutes  SIGNED:  Latrelle Dodrill, MD  [facility] 09/02/2016, 3:29 PM Pager   If 7PM-7AM, please contact night-coverage [email or web address] Password [email or web address]

## 2016-09-02 NOTE — Progress Notes (Addendum)
SW spoke with nurse who states that pt can be discharged today. SW sent requested discharge summary through the hub. [facility] to [facility] and spoke with SardisKelly who states that the pt is welcomed to come now.  SW made arrangements with [facility]. SW informed family and nurse that transportation has been arranged. They both have no questions at this time.   Nurse Report Number: [id number]  Crista CurbBrittney Tyquarius Paglia, MSW [phone]

## 2016-09-03 DIAGNOSIS — F325 Major depressive disorder, single episode, in full remission: Secondary | ICD-10-CM | POA: Diagnosis not present

## 2016-09-03 DIAGNOSIS — D649 Anemia, unspecified: Secondary | ICD-10-CM | POA: Diagnosis not present

## 2016-09-03 DIAGNOSIS — K5900 Constipation, unspecified: Secondary | ICD-10-CM | POA: Diagnosis not present

## 2016-09-03 DIAGNOSIS — S72002A Fracture of unspecified part of neck of left femur, initial encounter for closed fracture: Secondary | ICD-10-CM | POA: Diagnosis not present

## 2016-09-03 DIAGNOSIS — F0390 Unspecified dementia without behavioral disturbance: Secondary | ICD-10-CM | POA: Diagnosis not present

## 2016-09-05 ENCOUNTER — Telehealth (INDEPENDENT_AMBULATORY_CARE_PROVIDER_SITE_OTHER): Payer: Self-pay | Admitting: Orthopaedic Surgery

## 2016-09-05 NOTE — Telephone Encounter (Signed)
Patient needs a 2 week PO from 09/02/2016.  Contact Info: [phone]

## 2016-09-05 NOTE — Telephone Encounter (Signed)
appt 09/16/16 @ 9:00am

## 2016-09-05 NOTE — Telephone Encounter (Signed)
Could you please make patient a follow up appt? Thank you.

## 2016-09-16 ENCOUNTER — Ambulatory Visit (INDEPENDENT_AMBULATORY_CARE_PROVIDER_SITE_OTHER): Payer: No Typology Code available for payment source

## 2016-09-16 ENCOUNTER — Ambulatory Visit (INDEPENDENT_AMBULATORY_CARE_PROVIDER_SITE_OTHER): Payer: Medicare Other | Admitting: Orthopaedic Surgery

## 2016-09-16 ENCOUNTER — Encounter (INDEPENDENT_AMBULATORY_CARE_PROVIDER_SITE_OTHER): Payer: Self-pay | Admitting: Orthopaedic Surgery

## 2016-09-16 VITALS — BP 126/67 | HR 54

## 2016-09-16 DIAGNOSIS — S72002D Fracture of unspecified part of neck of left femur, subsequent encounter for closed fracture with routine healing: Secondary | ICD-10-CM | POA: Diagnosis not present

## 2016-09-16 DIAGNOSIS — M20012 Mallet finger of left finger(s): Secondary | ICD-10-CM | POA: Diagnosis not present

## 2016-09-16 DIAGNOSIS — Z96649 Presence of unspecified artificial hip joint: Secondary | ICD-10-CM | POA: Insufficient documentation

## 2016-09-16 NOTE — Progress Notes (Signed)
Office Visit Note   Patient: Gabrielle [name]           Date of Birth: Dec 11, 1926           MRN: [id number] Visit Date: 09/16/2016 Requested by: Gweneth DimitriWendy McNeill, MD [address] [place], [place] [address] PCP: [name],[name], MD  Subjective: Chief Complaint  Patient presents with  . Left Hip - Routine Post Op    Patient is s/p left hip hemiarthroplasty on 08/31/2016.  Doing well, no pain.  [facility]     Patient also complains of left small finger pain with flexion deformity at the DIP joint. States that this happened when she fell breaking her hip. States that she cannot extend her finger at the DIP joint. Some discomfort.             Review of Systems  Constitutional: Negative.   HENT: Negative.   Gastrointestinal: Negative.   Genitourinary: Negative.   Neurological: Negative.   Psychiatric/Behavioral: Negative.      Assessment & Plan: Visit Diagnoses:  1. Closed displaced fracture of left femoral neck with routine healing   2. S/P hip hemiarthroplasty   3. Mallet finger, left small finger     Plan: She'll [facility]. Anticipate discharge back to her assisted living when she is more independent. Follow-up in the office with us in 4 weeks for recheck. All questions answered.Patient was put in a finger splint and this must be on at all times. Stress to her the importance of this. Follow-up IN 3 weeks for recheck with repeat x-ray of her finger.  Follow-Up Instructions: Return in about 3 weeks (around 10/07/2016).   Orders:  Orders Placed This Encounter  Procedures  . XR HIP UNILAT W OR W/O PELVIS 1V LEFT  . XR Finger Little Left   No orders of the defined types were placed in this encounter.     Procedures: No procedures performed   Clinical Data: No additional findings.  Objective: Vital Signs: BP 126/67   Pulse (!) 54   Physical Exam  Constitutional:  She is oriented to person, place, and time. No distress.  HENT:  Head: Normocephalic.  Eyes: Pupils are equal, round, and reactive to light.  Neck: Normal range of motion.  Pulmonary/Chest: Effort normal.  Abdominal: She exhibits no distension.  Neurological: She is alert and oriented to person, place, and time.  Skin: Skin is warm.  Psychiatric: She has a normal mood and affect.    Ortho Exam Patient stands up from her wheelchair without difficulty. Left hip wound looks good. No drainage or signs of infection. She does have some redness around the staples. Staples were removed and Steri-Strips applied. Bilateral calves are nontender. Neurovascularly intact. Left hand she does have a flexion deformity at the DIP joint small finger. She is unable to actively extend at the joint. I am able to do this passively.  does have some tenderness over the DIP joint. No swelling or bruising. Specialty Comments:  No specialty comments available.  Imaging: Xr Hip Unilat W Or W/o Pelvis 1v Left  Result Date: 09/16/2016 X-ray AP pelvis and left hip show good alignment and seating of her prosthesis. Impression status post left hip hemiarthroplasty with good alignment and seating of the prosthesis. No complicating features.  Xr Finger Little Left  Result Date: 09/16/2016 X-ray left small finger shows mallet deformity with proximal distal phalanx fracture. Impression probable distal phalanx fracture with mallet deformity    PMFS History: Patient Active Problem List   Diagnosis Date Noted  . S/P hip hemiarthroplasty 09/16/2016  . Mallet finger, left small finger 09/16/2016  . Hip fracture (HCC) 08/30/2016  . Vascular dementia 08/30/2016  . Osteoporosis 08/30/2016  . Vaginal candida 08/30/2016  . UTI  (urinary tract infection) 08/30/2016  . Mild aortic insufficiency: Per 2 d echo 08/04/2015 08/04/2015  . Moderate mitral regurgitation: Per 2 d echo 08/04/2015 08/04/2015  . Glaucoma 08/04/2015  . Orthostasis: Mild 08/04/2015  . Syncope 08/03/2015  . Depression 12/09/2013  . Constipation 11/30/2013  . Pure hypercholesterolemia 11/30/2013  . Acute posthemorrhagic anemia 11/30/2013  . OA (osteoarthritis) of knee 11/21/2013   Past Medical History:  Diagnosis Date  . Arthritis    oa  . Glaucoma 08/04/2015  . Hyperlipidemia   . Mild aortic insufficiency: Per 2 d echo 08/04/2015 08/04/2015  . Moderate mitral regurgitation: Per 2 d echo 08/04/2015 08/04/2015    No family history on file.  Past Surgical History:  Procedure Laterality Date  . ABDOMINAL HYSTERECTOMY    . benign breast tumor removed  age 82  . HIP ARTHROPLASTY Left 08/31/2016   Procedure: ARTHROPLASTY  HIP (HEMIARTHROPLASTY);  Surgeon: Eldred MangesMark C Yates, MD;  [facility];  Service: Orthopedics;  Laterality: Left;  . TONSILLECTOMY  age 89  . TOTAL KNEE ARTHROPLASTY Right 11/21/2013   Procedure: RIGHT TOTAL KNEE ARTHROPLASTY;  Surgeon: Loanne DrillingFrank V Aluisio, MD;  Location: [facility] ORS;  Service: Orthopedics;  Laterality: Right;  . WRIST FRACTURE SURGERY Right    Social History   Occupational History  . Not on file.   Social History Main Topics  . Smoking status: Former Smoker    Packs/day: 1.00    Years: 17.00    Types: Cigarettes    Quit date: 11/10/1965  . Smokeless tobacco: Never Used  . Alcohol use Yes     Comment: very occasional wine  . Drug use: No  . Sexual activity: Not on file

## 2016-09-24 DIAGNOSIS — R2681 Unsteadiness on feet: Secondary | ICD-10-CM | POA: Diagnosis not present

## 2016-09-24 DIAGNOSIS — M6281 Muscle weakness (generalized): Secondary | ICD-10-CM | POA: Diagnosis not present

## 2016-09-24 DIAGNOSIS — Z9181 History of falling: Secondary | ICD-10-CM | POA: Diagnosis not present

## 2016-09-24 DIAGNOSIS — R41841 Cognitive communication deficit: Secondary | ICD-10-CM | POA: Diagnosis not present

## 2016-09-24 DIAGNOSIS — R262 Difficulty in walking, not elsewhere classified: Secondary | ICD-10-CM | POA: Diagnosis not present

## 2016-09-24 DIAGNOSIS — R488 Other symbolic dysfunctions: Secondary | ICD-10-CM | POA: Diagnosis not present

## 2016-09-25 DIAGNOSIS — R2681 Unsteadiness on feet: Secondary | ICD-10-CM | POA: Diagnosis not present

## 2016-09-25 DIAGNOSIS — R262 Difficulty in walking, not elsewhere classified: Secondary | ICD-10-CM | POA: Diagnosis not present

## 2016-09-25 DIAGNOSIS — R488 Other symbolic dysfunctions: Secondary | ICD-10-CM | POA: Diagnosis not present

## 2016-09-25 DIAGNOSIS — R41841 Cognitive communication deficit: Secondary | ICD-10-CM | POA: Diagnosis not present

## 2016-09-25 DIAGNOSIS — Z9181 History of falling: Secondary | ICD-10-CM | POA: Diagnosis not present

## 2016-09-25 DIAGNOSIS — M6281 Muscle weakness (generalized): Secondary | ICD-10-CM | POA: Diagnosis not present

## 2016-09-26 DIAGNOSIS — Z9181 History of falling: Secondary | ICD-10-CM | POA: Diagnosis not present

## 2016-09-26 DIAGNOSIS — R262 Difficulty in walking, not elsewhere classified: Secondary | ICD-10-CM | POA: Diagnosis not present

## 2016-09-26 DIAGNOSIS — M6281 Muscle weakness (generalized): Secondary | ICD-10-CM | POA: Diagnosis not present

## 2016-09-26 DIAGNOSIS — R41841 Cognitive communication deficit: Secondary | ICD-10-CM | POA: Diagnosis not present

## 2016-09-26 DIAGNOSIS — R2681 Unsteadiness on feet: Secondary | ICD-10-CM | POA: Diagnosis not present

## 2016-09-26 DIAGNOSIS — R488 Other symbolic dysfunctions: Secondary | ICD-10-CM | POA: Diagnosis not present

## 2016-09-29 ENCOUNTER — Telehealth (INDEPENDENT_AMBULATORY_CARE_PROVIDER_SITE_OTHER): Payer: Self-pay | Admitting: Orthopaedic Surgery

## 2016-09-29 DIAGNOSIS — R41841 Cognitive communication deficit: Secondary | ICD-10-CM | POA: Diagnosis not present

## 2016-09-29 DIAGNOSIS — R488 Other symbolic dysfunctions: Secondary | ICD-10-CM | POA: Diagnosis not present

## 2016-09-29 DIAGNOSIS — R2681 Unsteadiness on feet: Secondary | ICD-10-CM | POA: Diagnosis not present

## 2016-09-29 DIAGNOSIS — M6281 Muscle weakness (generalized): Secondary | ICD-10-CM | POA: Diagnosis not present

## 2016-09-29 DIAGNOSIS — R262 Difficulty in walking, not elsewhere classified: Secondary | ICD-10-CM | POA: Diagnosis not present

## 2016-09-29 DIAGNOSIS — Z9181 History of falling: Secondary | ICD-10-CM | POA: Diagnosis not present

## 2016-09-29 NOTE — Telephone Encounter (Signed)
Pts physical therapist stated the pt needed a compression sock rx. Please call when pt can pick this up.

## 2016-09-29 NOTE — Telephone Encounter (Signed)
Please advise 

## 2016-09-30 DIAGNOSIS — Z9181 History of falling: Secondary | ICD-10-CM | POA: Diagnosis not present

## 2016-09-30 DIAGNOSIS — R262 Difficulty in walking, not elsewhere classified: Secondary | ICD-10-CM | POA: Diagnosis not present

## 2016-09-30 DIAGNOSIS — R488 Other symbolic dysfunctions: Secondary | ICD-10-CM | POA: Diagnosis not present

## 2016-09-30 DIAGNOSIS — M6281 Muscle weakness (generalized): Secondary | ICD-10-CM | POA: Diagnosis not present

## 2016-09-30 DIAGNOSIS — R2681 Unsteadiness on feet: Secondary | ICD-10-CM | POA: Diagnosis not present

## 2016-09-30 DIAGNOSIS — R41841 Cognitive communication deficit: Secondary | ICD-10-CM | POA: Diagnosis not present

## 2016-09-30 NOTE — Telephone Encounter (Signed)
OK TO DO THX

## 2016-10-01 DIAGNOSIS — R488 Other symbolic dysfunctions: Secondary | ICD-10-CM | POA: Diagnosis not present

## 2016-10-01 DIAGNOSIS — Z9181 History of falling: Secondary | ICD-10-CM | POA: Diagnosis not present

## 2016-10-01 DIAGNOSIS — R2681 Unsteadiness on feet: Secondary | ICD-10-CM | POA: Diagnosis not present

## 2016-10-01 DIAGNOSIS — R262 Difficulty in walking, not elsewhere classified: Secondary | ICD-10-CM | POA: Diagnosis not present

## 2016-10-01 DIAGNOSIS — R41841 Cognitive communication deficit: Secondary | ICD-10-CM | POA: Diagnosis not present

## 2016-10-01 DIAGNOSIS — M6281 Muscle weakness (generalized): Secondary | ICD-10-CM | POA: Diagnosis not present

## 2016-10-03 DIAGNOSIS — R488 Other symbolic dysfunctions: Secondary | ICD-10-CM | POA: Diagnosis not present

## 2016-10-03 DIAGNOSIS — Z9181 History of falling: Secondary | ICD-10-CM | POA: Diagnosis not present

## 2016-10-03 DIAGNOSIS — R262 Difficulty in walking, not elsewhere classified: Secondary | ICD-10-CM | POA: Diagnosis not present

## 2016-10-03 DIAGNOSIS — M6281 Muscle weakness (generalized): Secondary | ICD-10-CM | POA: Diagnosis not present

## 2016-10-03 DIAGNOSIS — R2681 Unsteadiness on feet: Secondary | ICD-10-CM | POA: Diagnosis not present

## 2016-10-03 DIAGNOSIS — R41841 Cognitive communication deficit: Secondary | ICD-10-CM | POA: Diagnosis not present

## 2016-10-06 DIAGNOSIS — R262 Difficulty in walking, not elsewhere classified: Secondary | ICD-10-CM | POA: Diagnosis not present

## 2016-10-06 DIAGNOSIS — R41841 Cognitive communication deficit: Secondary | ICD-10-CM | POA: Diagnosis not present

## 2016-10-06 DIAGNOSIS — R2681 Unsteadiness on feet: Secondary | ICD-10-CM | POA: Diagnosis not present

## 2016-10-06 DIAGNOSIS — Z9181 History of falling: Secondary | ICD-10-CM | POA: Diagnosis not present

## 2016-10-06 DIAGNOSIS — R488 Other symbolic dysfunctions: Secondary | ICD-10-CM | POA: Diagnosis not present

## 2016-10-06 DIAGNOSIS — M6281 Muscle weakness (generalized): Secondary | ICD-10-CM | POA: Diagnosis not present

## 2016-10-07 DIAGNOSIS — M6281 Muscle weakness (generalized): Secondary | ICD-10-CM | POA: Diagnosis not present

## 2016-10-07 DIAGNOSIS — R41841 Cognitive communication deficit: Secondary | ICD-10-CM | POA: Diagnosis not present

## 2016-10-07 DIAGNOSIS — R262 Difficulty in walking, not elsewhere classified: Secondary | ICD-10-CM | POA: Diagnosis not present

## 2016-10-07 DIAGNOSIS — R488 Other symbolic dysfunctions: Secondary | ICD-10-CM | POA: Diagnosis not present

## 2016-10-07 DIAGNOSIS — Z9181 History of falling: Secondary | ICD-10-CM | POA: Diagnosis not present

## 2016-10-07 DIAGNOSIS — R2681 Unsteadiness on feet: Secondary | ICD-10-CM | POA: Diagnosis not present

## 2016-10-08 ENCOUNTER — Ambulatory Visit (INDEPENDENT_AMBULATORY_CARE_PROVIDER_SITE_OTHER): Payer: Medicare Other | Admitting: Orthopaedic Surgery

## 2016-10-08 ENCOUNTER — Ambulatory Visit (INDEPENDENT_AMBULATORY_CARE_PROVIDER_SITE_OTHER): Payer: PRIVATE HEALTH INSURANCE

## 2016-10-08 ENCOUNTER — Encounter (INDEPENDENT_AMBULATORY_CARE_PROVIDER_SITE_OTHER): Payer: Self-pay | Admitting: Orthopaedic Surgery

## 2016-10-08 VITALS — BP 162/77 | HR 56

## 2016-10-08 DIAGNOSIS — R41841 Cognitive communication deficit: Secondary | ICD-10-CM | POA: Diagnosis not present

## 2016-10-08 DIAGNOSIS — Z96649 Presence of unspecified artificial hip joint: Secondary | ICD-10-CM

## 2016-10-08 DIAGNOSIS — R262 Difficulty in walking, not elsewhere classified: Secondary | ICD-10-CM | POA: Diagnosis not present

## 2016-10-08 DIAGNOSIS — M6281 Muscle weakness (generalized): Secondary | ICD-10-CM | POA: Diagnosis not present

## 2016-10-08 DIAGNOSIS — M20012 Mallet finger of left finger(s): Secondary | ICD-10-CM | POA: Diagnosis not present

## 2016-10-08 DIAGNOSIS — R488 Other symbolic dysfunctions: Secondary | ICD-10-CM | POA: Diagnosis not present

## 2016-10-08 DIAGNOSIS — S72002D Fracture of unspecified part of neck of left femur, subsequent encounter for closed fracture with routine healing: Secondary | ICD-10-CM

## 2016-10-08 DIAGNOSIS — R2681 Unsteadiness on feet: Secondary | ICD-10-CM | POA: Diagnosis not present

## 2016-10-08 DIAGNOSIS — Z9181 History of falling: Secondary | ICD-10-CM | POA: Diagnosis not present

## 2016-10-08 NOTE — Progress Notes (Signed)
Office Visit Note   Patient: Gabrielle [name]           Date of Birth: [birth year]           MRN: [id number] Visit Date: 10/08/2016              Requested by: Gweneth DimitriWendy McNeill, MD [address] [place], [place] [address] PCP: [name],[name], MD   Assessment & Plan: Visit Diagnoses:  1. Mallet finger, left small finger   2. Closed fracture of left hip with routine healing, subsequent encounter   3. S/P hip hemiarthroplasty     Plan: Patient took a mallet finger splint off but states the fact that the finger doesn't completely extend really doesn't bother her anything she does. She doesn't remember when she took it off her why she did not but hasn't been wearing it for many weeks. She is happy with the results of her left hip fracture treatment with hemiarthroplasty. She is ambulatory has good pain relief in her incision is well-healed. We discussed using a mallet splint but patient states it really doesn't bother her the way her finger is and she wants to leave it off. She thinks she might have taken it off in her sleep and never put it back on  Follow-Up Instructions: Return if symptoms worsen or fail to improve.   Orders:  Orders Placed This Encounter  Procedures  . XR Finger Little Left   No orders of the defined types were placed in this encounter.     Procedures: No procedures performed   Clinical Data: No additional findings.   Subjective: Chief Complaint  Patient presents with  . Left Hip - Follow-up  . Left Little Finger - Follow-up    Patient is s/p left hip hemiarthroplasty on 08/31/2016. She states she has not much pain in hip. Ambulates with walker. Also follows up for left small finger. She states she has no pain in finger. Overall doing okay.    Review of Systems  Constitutional: Negative for chills and diaphoresis.  HENT: Negative for ear discharge, ear pain and nosebleeds.   Eyes: Negative for discharge and visual disturbance.  Respiratory:  Negative for cough, choking and shortness of breath.   Cardiovascular: Negative for chest pain and palpitations.  Gastrointestinal: Negative for abdominal distention and abdominal pain.  Endocrine: Negative for cold intolerance and heat intolerance.  Genitourinary: Negative for flank pain and hematuria.  Musculoskeletal:       History syncope with falls bony mallet finger deformity left small finger. Left femoral hip hemiarthroplasty 08/31/2016 doing well  Skin: Negative for rash and wound.  Neurological: Negative for seizures and speech difficulty.  Hematological: Negative for adenopathy. Does not bruise/bleed easily.  Psychiatric/Behavioral: Negative for agitation and suicidal ideas.     Objective: Vital Signs: BP (!) 162/77   Pulse (!) 56   Physical Exam  Musculoskeletal:  Left small finger of 40 flexion at the DIP joint. She cannot actively extend. Passively she still reaches full extension. She get her hand in and out of a pocket easily without catching. Good flexion strength. No tenderness at the DIP joint.    Ortho Exam hip incision on the left is completely healed. She is a mature with a Trendelenburg gait. She still uses walking aid we discussed fall prevention.  Specialty Comments:  No specialty comments available.  Imaging: No results found.   PMFS History: Patient Active Problem List   Diagnosis Date Noted  . S/P hip hemiarthroplasty 09/16/2016  .  Mallet finger, left small finger 09/16/2016  . Hip fracture (HCC) 08/30/2016  . Vascular dementia 08/30/2016  . Osteoporosis 08/30/2016  . Vaginal candida 08/30/2016  . UTI (urinary tract infection) 08/30/2016  . Mild aortic insufficiency: Per 2 d echo 08/04/2015 08/04/2015  . Moderate mitral regurgitation: Per 2 d echo 08/04/2015 08/04/2015  . Glaucoma 08/04/2015  . Orthostasis: Mild 08/04/2015  . Syncope 08/03/2015  . Depression 12/09/2013  . Constipation 11/30/2013  . Pure hypercholesterolemia 11/30/2013  .  Acute posthemorrhagic anemia 11/30/2013  . OA (osteoarthritis) of knee 11/21/2013   Past Medical History:  Diagnosis Date  . Arthritis    oa  . Glaucoma 08/04/2015  . Hyperlipidemia   . Mild aortic insufficiency: Per 2 d echo 08/04/2015 08/04/2015  . Moderate mitral regurgitation: Per 2 d echo 08/04/2015 08/04/2015    No family history on file.  Past Surgical History:  Procedure Laterality Date  . ABDOMINAL HYSTERECTOMY    . benign breast tumor removed  age 82  . HIP ARTHROPLASTY Left 08/31/2016   Procedure: ARTHROPLASTY  HIP (HEMIARTHROPLASTY);  Surgeon: Eldred MangesMark C Edythe Riches, MD;  [facility];  Service: Orthopedics;  Laterality: Left;  . TONSILLECTOMY  age 89  . TOTAL KNEE ARTHROPLASTY Right 11/21/2013   Procedure: RIGHT TOTAL KNEE ARTHROPLASTY;  Surgeon: Loanne DrillingFrank V Aluisio, MD;  Location: [facility] ORS;  Service: Orthopedics;  Laterality: Right;  . WRIST FRACTURE SURGERY Right    Social History   Occupational History  . Not on file.   Social History Main Topics  . Smoking status: Former Smoker    Packs/day: 1.00    Years: 17.00    Types: Cigarettes    Quit date: 11/10/1965  . Smokeless tobacco: Never Used  . Alcohol use Yes     Comment: very occasional wine  . Drug use: No  . Sexual activity: Not on file

## 2016-10-08 NOTE — Telephone Encounter (Signed)
done

## 2016-10-09 ENCOUNTER — Telehealth (INDEPENDENT_AMBULATORY_CARE_PROVIDER_SITE_OTHER): Payer: Self-pay | Admitting: *Deleted

## 2016-10-09 DIAGNOSIS — R41841 Cognitive communication deficit: Secondary | ICD-10-CM | POA: Diagnosis not present

## 2016-10-09 DIAGNOSIS — R488 Other symbolic dysfunctions: Secondary | ICD-10-CM | POA: Diagnosis not present

## 2016-10-09 DIAGNOSIS — M6281 Muscle weakness (generalized): Secondary | ICD-10-CM | POA: Diagnosis not present

## 2016-10-09 DIAGNOSIS — R262 Difficulty in walking, not elsewhere classified: Secondary | ICD-10-CM | POA: Diagnosis not present

## 2016-10-09 DIAGNOSIS — R2681 Unsteadiness on feet: Secondary | ICD-10-CM | POA: Diagnosis not present

## 2016-10-09 DIAGNOSIS — Z9181 History of falling: Secondary | ICD-10-CM | POA: Diagnosis not present

## 2016-10-09 NOTE — Telephone Encounter (Signed)
Monica from [facility] called for pts last office visit note

## 2016-10-09 NOTE — Telephone Encounter (Signed)
Can you send this for me? Sorry. I have not attended the release of info class. Thanks.

## 2016-10-10 DIAGNOSIS — Z9181 History of falling: Secondary | ICD-10-CM | POA: Diagnosis not present

## 2016-10-10 DIAGNOSIS — R2681 Unsteadiness on feet: Secondary | ICD-10-CM | POA: Diagnosis not present

## 2016-10-10 DIAGNOSIS — R488 Other symbolic dysfunctions: Secondary | ICD-10-CM | POA: Diagnosis not present

## 2016-10-10 DIAGNOSIS — M6281 Muscle weakness (generalized): Secondary | ICD-10-CM | POA: Diagnosis not present

## 2016-10-10 DIAGNOSIS — R262 Difficulty in walking, not elsewhere classified: Secondary | ICD-10-CM | POA: Diagnosis not present

## 2016-10-10 DIAGNOSIS — R41841 Cognitive communication deficit: Secondary | ICD-10-CM | POA: Diagnosis not present

## 2016-10-12 DIAGNOSIS — R488 Other symbolic dysfunctions: Secondary | ICD-10-CM | POA: Diagnosis not present

## 2016-10-12 DIAGNOSIS — R2681 Unsteadiness on feet: Secondary | ICD-10-CM | POA: Diagnosis not present

## 2016-10-12 DIAGNOSIS — R262 Difficulty in walking, not elsewhere classified: Secondary | ICD-10-CM | POA: Diagnosis not present

## 2016-10-12 DIAGNOSIS — Z9181 History of falling: Secondary | ICD-10-CM | POA: Diagnosis not present

## 2016-10-12 DIAGNOSIS — R41841 Cognitive communication deficit: Secondary | ICD-10-CM | POA: Diagnosis not present

## 2016-10-12 DIAGNOSIS — M6281 Muscle weakness (generalized): Secondary | ICD-10-CM | POA: Diagnosis not present

## 2016-10-13 DIAGNOSIS — R488 Other symbolic dysfunctions: Secondary | ICD-10-CM | POA: Diagnosis not present

## 2016-10-13 DIAGNOSIS — R262 Difficulty in walking, not elsewhere classified: Secondary | ICD-10-CM | POA: Diagnosis not present

## 2016-10-13 DIAGNOSIS — M6281 Muscle weakness (generalized): Secondary | ICD-10-CM | POA: Diagnosis not present

## 2016-10-13 DIAGNOSIS — Z9181 History of falling: Secondary | ICD-10-CM | POA: Diagnosis not present

## 2016-10-13 DIAGNOSIS — R41841 Cognitive communication deficit: Secondary | ICD-10-CM | POA: Diagnosis not present

## 2016-10-13 DIAGNOSIS — R2681 Unsteadiness on feet: Secondary | ICD-10-CM | POA: Diagnosis not present

## 2016-10-13 NOTE — Telephone Encounter (Signed)
I called Maxine GlennMonica and got the fax number and faxed the progress note to her

## 2016-10-14 DIAGNOSIS — R488 Other symbolic dysfunctions: Secondary | ICD-10-CM | POA: Diagnosis not present

## 2016-10-14 DIAGNOSIS — Z9181 History of falling: Secondary | ICD-10-CM | POA: Diagnosis not present

## 2016-10-14 DIAGNOSIS — R41841 Cognitive communication deficit: Secondary | ICD-10-CM | POA: Diagnosis not present

## 2016-10-14 DIAGNOSIS — R262 Difficulty in walking, not elsewhere classified: Secondary | ICD-10-CM | POA: Diagnosis not present

## 2016-10-14 DIAGNOSIS — M6281 Muscle weakness (generalized): Secondary | ICD-10-CM | POA: Diagnosis not present

## 2016-10-14 DIAGNOSIS — R2681 Unsteadiness on feet: Secondary | ICD-10-CM | POA: Diagnosis not present

## 2016-10-15 DIAGNOSIS — M6281 Muscle weakness (generalized): Secondary | ICD-10-CM | POA: Diagnosis not present

## 2016-10-15 DIAGNOSIS — R41841 Cognitive communication deficit: Secondary | ICD-10-CM | POA: Diagnosis not present

## 2016-10-15 DIAGNOSIS — Z9181 History of falling: Secondary | ICD-10-CM | POA: Diagnosis not present

## 2016-10-15 DIAGNOSIS — R488 Other symbolic dysfunctions: Secondary | ICD-10-CM | POA: Diagnosis not present

## 2016-10-15 DIAGNOSIS — R2681 Unsteadiness on feet: Secondary | ICD-10-CM | POA: Diagnosis not present

## 2016-10-15 DIAGNOSIS — R262 Difficulty in walking, not elsewhere classified: Secondary | ICD-10-CM | POA: Diagnosis not present

## 2016-10-16 DIAGNOSIS — R41841 Cognitive communication deficit: Secondary | ICD-10-CM | POA: Diagnosis not present

## 2016-10-16 DIAGNOSIS — R2681 Unsteadiness on feet: Secondary | ICD-10-CM | POA: Diagnosis not present

## 2016-10-16 DIAGNOSIS — R488 Other symbolic dysfunctions: Secondary | ICD-10-CM | POA: Diagnosis not present

## 2016-10-16 DIAGNOSIS — Z9181 History of falling: Secondary | ICD-10-CM | POA: Diagnosis not present

## 2016-10-16 DIAGNOSIS — R262 Difficulty in walking, not elsewhere classified: Secondary | ICD-10-CM | POA: Diagnosis not present

## 2016-10-16 DIAGNOSIS — M6281 Muscle weakness (generalized): Secondary | ICD-10-CM | POA: Diagnosis not present

## 2016-10-17 DIAGNOSIS — R2681 Unsteadiness on feet: Secondary | ICD-10-CM | POA: Diagnosis not present

## 2016-10-17 DIAGNOSIS — R41841 Cognitive communication deficit: Secondary | ICD-10-CM | POA: Diagnosis not present

## 2016-10-17 DIAGNOSIS — R488 Other symbolic dysfunctions: Secondary | ICD-10-CM | POA: Diagnosis not present

## 2016-10-17 DIAGNOSIS — Z9181 History of falling: Secondary | ICD-10-CM | POA: Diagnosis not present

## 2016-10-17 DIAGNOSIS — M6281 Muscle weakness (generalized): Secondary | ICD-10-CM | POA: Diagnosis not present

## 2016-10-17 DIAGNOSIS — R262 Difficulty in walking, not elsewhere classified: Secondary | ICD-10-CM | POA: Diagnosis not present

## 2016-10-20 DIAGNOSIS — M6281 Muscle weakness (generalized): Secondary | ICD-10-CM | POA: Diagnosis not present

## 2016-10-20 DIAGNOSIS — R262 Difficulty in walking, not elsewhere classified: Secondary | ICD-10-CM | POA: Diagnosis not present

## 2016-10-20 DIAGNOSIS — R2681 Unsteadiness on feet: Secondary | ICD-10-CM | POA: Diagnosis not present

## 2016-10-20 DIAGNOSIS — R41841 Cognitive communication deficit: Secondary | ICD-10-CM | POA: Diagnosis not present

## 2016-10-20 DIAGNOSIS — R488 Other symbolic dysfunctions: Secondary | ICD-10-CM | POA: Diagnosis not present

## 2016-10-20 DIAGNOSIS — Z9181 History of falling: Secondary | ICD-10-CM | POA: Diagnosis not present

## 2016-10-21 DIAGNOSIS — R262 Difficulty in walking, not elsewhere classified: Secondary | ICD-10-CM | POA: Diagnosis not present

## 2016-10-21 DIAGNOSIS — R488 Other symbolic dysfunctions: Secondary | ICD-10-CM | POA: Diagnosis not present

## 2016-10-21 DIAGNOSIS — R2681 Unsteadiness on feet: Secondary | ICD-10-CM | POA: Diagnosis not present

## 2016-10-21 DIAGNOSIS — R41841 Cognitive communication deficit: Secondary | ICD-10-CM | POA: Diagnosis not present

## 2016-10-21 DIAGNOSIS — M6281 Muscle weakness (generalized): Secondary | ICD-10-CM | POA: Diagnosis not present

## 2016-10-21 DIAGNOSIS — Z9181 History of falling: Secondary | ICD-10-CM | POA: Diagnosis not present

## 2016-10-22 DIAGNOSIS — Z9181 History of falling: Secondary | ICD-10-CM | POA: Diagnosis not present

## 2016-10-22 DIAGNOSIS — R488 Other symbolic dysfunctions: Secondary | ICD-10-CM | POA: Diagnosis not present

## 2016-10-22 DIAGNOSIS — R2681 Unsteadiness on feet: Secondary | ICD-10-CM | POA: Diagnosis not present

## 2016-10-22 DIAGNOSIS — R41841 Cognitive communication deficit: Secondary | ICD-10-CM | POA: Diagnosis not present

## 2016-10-22 DIAGNOSIS — R262 Difficulty in walking, not elsewhere classified: Secondary | ICD-10-CM | POA: Diagnosis not present

## 2016-10-22 DIAGNOSIS — M6281 Muscle weakness (generalized): Secondary | ICD-10-CM | POA: Diagnosis not present

## 2016-10-23 DIAGNOSIS — M810 Age-related osteoporosis without current pathological fracture: Secondary | ICD-10-CM | POA: Diagnosis not present

## 2016-10-24 DIAGNOSIS — R488 Other symbolic dysfunctions: Secondary | ICD-10-CM | POA: Diagnosis not present

## 2016-10-24 DIAGNOSIS — R2681 Unsteadiness on feet: Secondary | ICD-10-CM | POA: Diagnosis not present

## 2016-10-24 DIAGNOSIS — Z9181 History of falling: Secondary | ICD-10-CM | POA: Diagnosis not present

## 2016-10-24 DIAGNOSIS — R41841 Cognitive communication deficit: Secondary | ICD-10-CM | POA: Diagnosis not present

## 2016-10-24 DIAGNOSIS — M6281 Muscle weakness (generalized): Secondary | ICD-10-CM | POA: Diagnosis not present

## 2016-10-24 DIAGNOSIS — R262 Difficulty in walking, not elsewhere classified: Secondary | ICD-10-CM | POA: Diagnosis not present

## 2016-10-27 DIAGNOSIS — M6281 Muscle weakness (generalized): Secondary | ICD-10-CM | POA: Diagnosis not present

## 2016-10-27 DIAGNOSIS — R262 Difficulty in walking, not elsewhere classified: Secondary | ICD-10-CM | POA: Diagnosis not present

## 2016-10-27 DIAGNOSIS — R488 Other symbolic dysfunctions: Secondary | ICD-10-CM | POA: Diagnosis not present

## 2016-10-27 DIAGNOSIS — R2681 Unsteadiness on feet: Secondary | ICD-10-CM | POA: Diagnosis not present

## 2016-10-27 DIAGNOSIS — Z9181 History of falling: Secondary | ICD-10-CM | POA: Diagnosis not present

## 2016-10-27 DIAGNOSIS — R41841 Cognitive communication deficit: Secondary | ICD-10-CM | POA: Diagnosis not present

## 2016-10-28 DIAGNOSIS — R2681 Unsteadiness on feet: Secondary | ICD-10-CM | POA: Diagnosis not present

## 2016-10-28 DIAGNOSIS — R488 Other symbolic dysfunctions: Secondary | ICD-10-CM | POA: Diagnosis not present

## 2016-10-28 DIAGNOSIS — R262 Difficulty in walking, not elsewhere classified: Secondary | ICD-10-CM | POA: Diagnosis not present

## 2016-10-28 DIAGNOSIS — M6281 Muscle weakness (generalized): Secondary | ICD-10-CM | POA: Diagnosis not present

## 2016-10-28 DIAGNOSIS — R41841 Cognitive communication deficit: Secondary | ICD-10-CM | POA: Diagnosis not present

## 2016-10-28 DIAGNOSIS — Z9181 History of falling: Secondary | ICD-10-CM | POA: Diagnosis not present

## 2016-10-29 DIAGNOSIS — R262 Difficulty in walking, not elsewhere classified: Secondary | ICD-10-CM | POA: Diagnosis not present

## 2016-10-29 DIAGNOSIS — Z9181 History of falling: Secondary | ICD-10-CM | POA: Diagnosis not present

## 2016-10-29 DIAGNOSIS — M6281 Muscle weakness (generalized): Secondary | ICD-10-CM | POA: Diagnosis not present

## 2016-10-29 DIAGNOSIS — R488 Other symbolic dysfunctions: Secondary | ICD-10-CM | POA: Diagnosis not present

## 2016-10-29 DIAGNOSIS — R41841 Cognitive communication deficit: Secondary | ICD-10-CM | POA: Diagnosis not present

## 2016-10-29 DIAGNOSIS — R2681 Unsteadiness on feet: Secondary | ICD-10-CM | POA: Diagnosis not present

## 2016-10-30 DIAGNOSIS — M6281 Muscle weakness (generalized): Secondary | ICD-10-CM | POA: Diagnosis not present

## 2016-10-30 DIAGNOSIS — R262 Difficulty in walking, not elsewhere classified: Secondary | ICD-10-CM | POA: Diagnosis not present

## 2016-10-30 DIAGNOSIS — R2681 Unsteadiness on feet: Secondary | ICD-10-CM | POA: Diagnosis not present

## 2016-10-30 DIAGNOSIS — Z9181 History of falling: Secondary | ICD-10-CM | POA: Diagnosis not present

## 2016-10-30 DIAGNOSIS — R41841 Cognitive communication deficit: Secondary | ICD-10-CM | POA: Diagnosis not present

## 2016-10-30 DIAGNOSIS — R488 Other symbolic dysfunctions: Secondary | ICD-10-CM | POA: Diagnosis not present

## 2016-10-31 DIAGNOSIS — R41841 Cognitive communication deficit: Secondary | ICD-10-CM | POA: Diagnosis not present

## 2016-10-31 DIAGNOSIS — R2681 Unsteadiness on feet: Secondary | ICD-10-CM | POA: Diagnosis not present

## 2016-10-31 DIAGNOSIS — R262 Difficulty in walking, not elsewhere classified: Secondary | ICD-10-CM | POA: Diagnosis not present

## 2016-10-31 DIAGNOSIS — Z9181 History of falling: Secondary | ICD-10-CM | POA: Diagnosis not present

## 2016-10-31 DIAGNOSIS — M6281 Muscle weakness (generalized): Secondary | ICD-10-CM | POA: Diagnosis not present

## 2016-10-31 DIAGNOSIS — R488 Other symbolic dysfunctions: Secondary | ICD-10-CM | POA: Diagnosis not present

## 2016-11-05 DIAGNOSIS — M6281 Muscle weakness (generalized): Secondary | ICD-10-CM | POA: Diagnosis not present

## 2016-11-05 DIAGNOSIS — R262 Difficulty in walking, not elsewhere classified: Secondary | ICD-10-CM | POA: Diagnosis not present

## 2016-11-05 DIAGNOSIS — Z9181 History of falling: Secondary | ICD-10-CM | POA: Diagnosis not present

## 2016-11-05 DIAGNOSIS — R41841 Cognitive communication deficit: Secondary | ICD-10-CM | POA: Diagnosis not present

## 2016-11-05 DIAGNOSIS — R2681 Unsteadiness on feet: Secondary | ICD-10-CM | POA: Diagnosis not present

## 2016-11-05 DIAGNOSIS — R488 Other symbolic dysfunctions: Secondary | ICD-10-CM | POA: Diagnosis not present

## 2016-11-07 DIAGNOSIS — R2681 Unsteadiness on feet: Secondary | ICD-10-CM | POA: Diagnosis not present

## 2016-11-07 DIAGNOSIS — R262 Difficulty in walking, not elsewhere classified: Secondary | ICD-10-CM | POA: Diagnosis not present

## 2016-11-07 DIAGNOSIS — Z9181 History of falling: Secondary | ICD-10-CM | POA: Diagnosis not present

## 2016-11-07 DIAGNOSIS — M6281 Muscle weakness (generalized): Secondary | ICD-10-CM | POA: Diagnosis not present

## 2016-11-07 DIAGNOSIS — R41841 Cognitive communication deficit: Secondary | ICD-10-CM | POA: Diagnosis not present

## 2016-11-07 DIAGNOSIS — R488 Other symbolic dysfunctions: Secondary | ICD-10-CM | POA: Diagnosis not present

## 2016-11-11 DIAGNOSIS — R488 Other symbolic dysfunctions: Secondary | ICD-10-CM | POA: Diagnosis not present

## 2016-11-11 DIAGNOSIS — M6281 Muscle weakness (generalized): Secondary | ICD-10-CM | POA: Diagnosis not present

## 2016-11-11 DIAGNOSIS — R41841 Cognitive communication deficit: Secondary | ICD-10-CM | POA: Diagnosis not present

## 2016-11-11 DIAGNOSIS — Z9181 History of falling: Secondary | ICD-10-CM | POA: Diagnosis not present

## 2016-11-11 DIAGNOSIS — R2681 Unsteadiness on feet: Secondary | ICD-10-CM | POA: Diagnosis not present

## 2016-11-12 DIAGNOSIS — M6281 Muscle weakness (generalized): Secondary | ICD-10-CM | POA: Diagnosis not present

## 2016-11-12 DIAGNOSIS — R488 Other symbolic dysfunctions: Secondary | ICD-10-CM | POA: Diagnosis not present

## 2016-11-12 DIAGNOSIS — R2681 Unsteadiness on feet: Secondary | ICD-10-CM | POA: Diagnosis not present

## 2016-11-12 DIAGNOSIS — Z9181 History of falling: Secondary | ICD-10-CM | POA: Diagnosis not present

## 2016-11-12 DIAGNOSIS — R41841 Cognitive communication deficit: Secondary | ICD-10-CM | POA: Diagnosis not present

## 2016-11-13 DIAGNOSIS — R2681 Unsteadiness on feet: Secondary | ICD-10-CM | POA: Diagnosis not present

## 2016-11-13 DIAGNOSIS — R41841 Cognitive communication deficit: Secondary | ICD-10-CM | POA: Diagnosis not present

## 2016-11-13 DIAGNOSIS — R488 Other symbolic dysfunctions: Secondary | ICD-10-CM | POA: Diagnosis not present

## 2016-11-13 DIAGNOSIS — M6281 Muscle weakness (generalized): Secondary | ICD-10-CM | POA: Diagnosis not present

## 2016-11-13 DIAGNOSIS — Z9181 History of falling: Secondary | ICD-10-CM | POA: Diagnosis not present

## 2016-11-17 DIAGNOSIS — M6281 Muscle weakness (generalized): Secondary | ICD-10-CM | POA: Diagnosis not present

## 2016-11-17 DIAGNOSIS — R488 Other symbolic dysfunctions: Secondary | ICD-10-CM | POA: Diagnosis not present

## 2016-11-17 DIAGNOSIS — Z9181 History of falling: Secondary | ICD-10-CM | POA: Diagnosis not present

## 2016-11-17 DIAGNOSIS — R41841 Cognitive communication deficit: Secondary | ICD-10-CM | POA: Diagnosis not present

## 2016-11-17 DIAGNOSIS — R2681 Unsteadiness on feet: Secondary | ICD-10-CM | POA: Diagnosis not present

## 2016-11-19 DIAGNOSIS — Z9181 History of falling: Secondary | ICD-10-CM | POA: Diagnosis not present

## 2016-11-19 DIAGNOSIS — R488 Other symbolic dysfunctions: Secondary | ICD-10-CM | POA: Diagnosis not present

## 2016-11-19 DIAGNOSIS — M6281 Muscle weakness (generalized): Secondary | ICD-10-CM | POA: Diagnosis not present

## 2016-11-19 DIAGNOSIS — R41841 Cognitive communication deficit: Secondary | ICD-10-CM | POA: Diagnosis not present

## 2016-11-19 DIAGNOSIS — R2681 Unsteadiness on feet: Secondary | ICD-10-CM | POA: Diagnosis not present

## 2016-11-20 DIAGNOSIS — R41841 Cognitive communication deficit: Secondary | ICD-10-CM | POA: Diagnosis not present

## 2016-11-20 DIAGNOSIS — R488 Other symbolic dysfunctions: Secondary | ICD-10-CM | POA: Diagnosis not present

## 2016-11-20 DIAGNOSIS — R2681 Unsteadiness on feet: Secondary | ICD-10-CM | POA: Diagnosis not present

## 2016-11-20 DIAGNOSIS — M6281 Muscle weakness (generalized): Secondary | ICD-10-CM | POA: Diagnosis not present

## 2016-11-20 DIAGNOSIS — Z9181 History of falling: Secondary | ICD-10-CM | POA: Diagnosis not present

## 2016-11-21 DIAGNOSIS — R2681 Unsteadiness on feet: Secondary | ICD-10-CM | POA: Diagnosis not present

## 2016-11-21 DIAGNOSIS — M6281 Muscle weakness (generalized): Secondary | ICD-10-CM | POA: Diagnosis not present

## 2016-11-21 DIAGNOSIS — Z9181 History of falling: Secondary | ICD-10-CM | POA: Diagnosis not present

## 2016-11-21 DIAGNOSIS — R41841 Cognitive communication deficit: Secondary | ICD-10-CM | POA: Diagnosis not present

## 2016-11-21 DIAGNOSIS — R488 Other symbolic dysfunctions: Secondary | ICD-10-CM | POA: Diagnosis not present

## 2016-11-24 DIAGNOSIS — R2681 Unsteadiness on feet: Secondary | ICD-10-CM | POA: Diagnosis not present

## 2016-11-24 DIAGNOSIS — M6281 Muscle weakness (generalized): Secondary | ICD-10-CM | POA: Diagnosis not present

## 2016-11-24 DIAGNOSIS — Z9181 History of falling: Secondary | ICD-10-CM | POA: Diagnosis not present

## 2016-11-24 DIAGNOSIS — R488 Other symbolic dysfunctions: Secondary | ICD-10-CM | POA: Diagnosis not present

## 2016-11-24 DIAGNOSIS — R41841 Cognitive communication deficit: Secondary | ICD-10-CM | POA: Diagnosis not present

## 2016-11-25 DIAGNOSIS — Z9181 History of falling: Secondary | ICD-10-CM | POA: Diagnosis not present

## 2016-11-25 DIAGNOSIS — M6281 Muscle weakness (generalized): Secondary | ICD-10-CM | POA: Diagnosis not present

## 2016-11-25 DIAGNOSIS — R41841 Cognitive communication deficit: Secondary | ICD-10-CM | POA: Diagnosis not present

## 2016-11-25 DIAGNOSIS — R2681 Unsteadiness on feet: Secondary | ICD-10-CM | POA: Diagnosis not present

## 2016-11-25 DIAGNOSIS — R488 Other symbolic dysfunctions: Secondary | ICD-10-CM | POA: Diagnosis not present

## 2016-11-28 DIAGNOSIS — R488 Other symbolic dysfunctions: Secondary | ICD-10-CM | POA: Diagnosis not present

## 2016-11-28 DIAGNOSIS — Z9181 History of falling: Secondary | ICD-10-CM | POA: Diagnosis not present

## 2016-11-28 DIAGNOSIS — M6281 Muscle weakness (generalized): Secondary | ICD-10-CM | POA: Diagnosis not present

## 2016-11-28 DIAGNOSIS — R41841 Cognitive communication deficit: Secondary | ICD-10-CM | POA: Diagnosis not present

## 2016-11-28 DIAGNOSIS — R2681 Unsteadiness on feet: Secondary | ICD-10-CM | POA: Diagnosis not present

## 2016-12-01 DIAGNOSIS — R41841 Cognitive communication deficit: Secondary | ICD-10-CM | POA: Diagnosis not present

## 2016-12-01 DIAGNOSIS — R488 Other symbolic dysfunctions: Secondary | ICD-10-CM | POA: Diagnosis not present

## 2016-12-01 DIAGNOSIS — Z9181 History of falling: Secondary | ICD-10-CM | POA: Diagnosis not present

## 2016-12-01 DIAGNOSIS — R2681 Unsteadiness on feet: Secondary | ICD-10-CM | POA: Diagnosis not present

## 2016-12-01 DIAGNOSIS — M6281 Muscle weakness (generalized): Secondary | ICD-10-CM | POA: Diagnosis not present

## 2016-12-02 DIAGNOSIS — M6281 Muscle weakness (generalized): Secondary | ICD-10-CM | POA: Diagnosis not present

## 2016-12-02 DIAGNOSIS — R488 Other symbolic dysfunctions: Secondary | ICD-10-CM | POA: Diagnosis not present

## 2016-12-02 DIAGNOSIS — R2681 Unsteadiness on feet: Secondary | ICD-10-CM | POA: Diagnosis not present

## 2016-12-02 DIAGNOSIS — R41841 Cognitive communication deficit: Secondary | ICD-10-CM | POA: Diagnosis not present

## 2016-12-02 DIAGNOSIS — Z9181 History of falling: Secondary | ICD-10-CM | POA: Diagnosis not present

## 2016-12-03 DIAGNOSIS — H353232 Exudative age-related macular degeneration, bilateral, with inactive choroidal neovascularization: Secondary | ICD-10-CM | POA: Diagnosis not present

## 2016-12-03 DIAGNOSIS — H35432 Paving stone degeneration of retina, left eye: Secondary | ICD-10-CM | POA: Diagnosis not present

## 2016-12-03 DIAGNOSIS — H35341 Macular cyst, hole, or pseudohole, right eye: Secondary | ICD-10-CM | POA: Diagnosis not present

## 2016-12-03 DIAGNOSIS — H35372 Puckering of macula, left eye: Secondary | ICD-10-CM | POA: Diagnosis not present

## 2016-12-05 ENCOUNTER — Encounter (HOSPITAL_COMMUNITY): Payer: Self-pay | Admitting: Emergency Medicine

## 2016-12-05 ENCOUNTER — Inpatient Hospital Stay (HOSPITAL_COMMUNITY)
Admission: EM | Admit: 2016-12-05 | Discharge: 2016-12-10 | DRG: 470 | Disposition: A | Discharge status: Skilled Nursing Facility | Payer: Medicare Other | Attending: Internal Medicine | Admitting: Internal Medicine

## 2016-12-05 ENCOUNTER — Emergency Department (HOSPITAL_COMMUNITY): Payer: Medicare Other

## 2016-12-05 DIAGNOSIS — T8451XA Infection and inflammatory reaction due to internal right hip prosthesis, initial encounter: Secondary | ICD-10-CM | POA: Diagnosis not present

## 2016-12-05 DIAGNOSIS — J101 Influenza due to other identified influenza virus with other respiratory manifestations: Secondary | ICD-10-CM | POA: Diagnosis not present

## 2016-12-05 DIAGNOSIS — R42 Dizziness and giddiness: Secondary | ICD-10-CM | POA: Diagnosis not present

## 2016-12-05 DIAGNOSIS — M150 Primary generalized (osteo)arthritis: Secondary | ICD-10-CM | POA: Diagnosis not present

## 2016-12-05 DIAGNOSIS — R05 Cough: Secondary | ICD-10-CM | POA: Diagnosis present

## 2016-12-05 DIAGNOSIS — Z96642 Presence of left artificial hip joint: Secondary | ICD-10-CM | POA: Diagnosis present

## 2016-12-05 DIAGNOSIS — S79911A Unspecified injury of right hip, initial encounter: Secondary | ICD-10-CM | POA: Diagnosis not present

## 2016-12-05 DIAGNOSIS — T8459XA Infection and inflammatory reaction due to other internal joint prosthesis, initial encounter: Secondary | ICD-10-CM | POA: Diagnosis not present

## 2016-12-05 DIAGNOSIS — E7800 Pure hypercholesterolemia, unspecified: Secondary | ICD-10-CM | POA: Diagnosis present

## 2016-12-05 DIAGNOSIS — R059 Cough, unspecified: Secondary | ICD-10-CM | POA: Diagnosis present

## 2016-12-05 DIAGNOSIS — Z96643 Presence of artificial hip joint, bilateral: Secondary | ICD-10-CM | POA: Diagnosis not present

## 2016-12-05 DIAGNOSIS — Z87891 Personal history of nicotine dependence: Secondary | ICD-10-CM

## 2016-12-05 DIAGNOSIS — M542 Cervicalgia: Secondary | ICD-10-CM | POA: Diagnosis not present

## 2016-12-05 DIAGNOSIS — D62 Acute posthemorrhagic anemia: Secondary | ICD-10-CM | POA: Diagnosis not present

## 2016-12-05 DIAGNOSIS — Z96651 Presence of right artificial knee joint: Secondary | ICD-10-CM | POA: Diagnosis not present

## 2016-12-05 DIAGNOSIS — R278 Other lack of coordination: Secondary | ICD-10-CM | POA: Diagnosis not present

## 2016-12-05 DIAGNOSIS — Z7982 Long term (current) use of aspirin: Secondary | ICD-10-CM | POA: Diagnosis not present

## 2016-12-05 DIAGNOSIS — E785 Hyperlipidemia, unspecified: Secondary | ICD-10-CM | POA: Diagnosis not present

## 2016-12-05 DIAGNOSIS — F329 Major depressive disorder, single episode, unspecified: Secondary | ICD-10-CM | POA: Diagnosis present

## 2016-12-05 DIAGNOSIS — E860 Dehydration: Secondary | ICD-10-CM | POA: Diagnosis not present

## 2016-12-05 DIAGNOSIS — S72011A Unspecified intracapsular fracture of right femur, initial encounter for closed fracture: Secondary | ICD-10-CM | POA: Diagnosis not present

## 2016-12-05 DIAGNOSIS — J111 Influenza due to unidentified influenza virus with other respiratory manifestations: Secondary | ICD-10-CM | POA: Diagnosis not present

## 2016-12-05 DIAGNOSIS — Z66 Do not resuscitate: Secondary | ICD-10-CM | POA: Diagnosis not present

## 2016-12-05 DIAGNOSIS — S72001A Fracture of unspecified part of neck of right femur, initial encounter for closed fracture: Secondary | ICD-10-CM | POA: Diagnosis present

## 2016-12-05 DIAGNOSIS — W19XXXA Unspecified fall, initial encounter: Secondary | ICD-10-CM | POA: Diagnosis not present

## 2016-12-05 DIAGNOSIS — R404 Transient alteration of awareness: Secondary | ICD-10-CM | POA: Diagnosis not present

## 2016-12-05 DIAGNOSIS — I080 Rheumatic disorders of both mitral and aortic valves: Secondary | ICD-10-CM | POA: Diagnosis present

## 2016-12-05 DIAGNOSIS — T1490XA Injury, unspecified, initial encounter: Secondary | ICD-10-CM | POA: Diagnosis not present

## 2016-12-05 DIAGNOSIS — Z96641 Presence of right artificial hip joint: Secondary | ICD-10-CM | POA: Diagnosis not present

## 2016-12-05 DIAGNOSIS — F0150 Vascular dementia without behavioral disturbance: Secondary | ICD-10-CM | POA: Diagnosis not present

## 2016-12-05 DIAGNOSIS — Z9181 History of falling: Secondary | ICD-10-CM | POA: Diagnosis not present

## 2016-12-05 DIAGNOSIS — R031 Nonspecific low blood-pressure reading: Secondary | ICD-10-CM | POA: Diagnosis not present

## 2016-12-05 DIAGNOSIS — H409 Unspecified glaucoma: Secondary | ICD-10-CM | POA: Diagnosis present

## 2016-12-05 DIAGNOSIS — S0990XA Unspecified injury of head, initial encounter: Secondary | ICD-10-CM | POA: Diagnosis not present

## 2016-12-05 DIAGNOSIS — R0602 Shortness of breath: Secondary | ICD-10-CM | POA: Diagnosis not present

## 2016-12-05 DIAGNOSIS — M1990 Unspecified osteoarthritis, unspecified site: Secondary | ICD-10-CM | POA: Diagnosis present

## 2016-12-05 DIAGNOSIS — R2689 Other abnormalities of gait and mobility: Secondary | ICD-10-CM | POA: Diagnosis not present

## 2016-12-05 DIAGNOSIS — R41841 Cognitive communication deficit: Secondary | ICD-10-CM | POA: Diagnosis not present

## 2016-12-05 DIAGNOSIS — Z96649 Presence of unspecified artificial hip joint: Secondary | ICD-10-CM | POA: Diagnosis not present

## 2016-12-05 DIAGNOSIS — M6281 Muscle weakness (generalized): Secondary | ICD-10-CM | POA: Diagnosis not present

## 2016-12-05 DIAGNOSIS — Z79899 Other long term (current) drug therapy: Secondary | ICD-10-CM | POA: Diagnosis not present

## 2016-12-05 DIAGNOSIS — F0390 Unspecified dementia without behavioral disturbance: Secondary | ICD-10-CM | POA: Diagnosis present

## 2016-12-05 DIAGNOSIS — J1108 Influenza due to unidentified influenza virus with specified pneumonia: Secondary | ICD-10-CM | POA: Diagnosis not present

## 2016-12-05 DIAGNOSIS — Z7901 Long term (current) use of anticoagulants: Secondary | ICD-10-CM | POA: Diagnosis not present

## 2016-12-05 DIAGNOSIS — J989 Respiratory disorder, unspecified: Secondary | ICD-10-CM | POA: Diagnosis not present

## 2016-12-05 DIAGNOSIS — K5900 Constipation, unspecified: Secondary | ICD-10-CM | POA: Diagnosis not present

## 2016-12-05 DIAGNOSIS — M810 Age-related osteoporosis without current pathological fracture: Secondary | ICD-10-CM | POA: Diagnosis present

## 2016-12-05 DIAGNOSIS — S199XXA Unspecified injury of neck, initial encounter: Secondary | ICD-10-CM | POA: Diagnosis not present

## 2016-12-05 DIAGNOSIS — Z471 Aftercare following joint replacement surgery: Secondary | ICD-10-CM | POA: Diagnosis not present

## 2016-12-05 DIAGNOSIS — Z90710 Acquired absence of both cervix and uterus: Secondary | ICD-10-CM

## 2016-12-05 DIAGNOSIS — F32A Depression, unspecified: Secondary | ICD-10-CM | POA: Diagnosis present

## 2016-12-05 DIAGNOSIS — W19XXXD Unspecified fall, subsequent encounter: Secondary | ICD-10-CM | POA: Diagnosis not present

## 2016-12-05 DIAGNOSIS — R0981 Nasal congestion: Secondary | ICD-10-CM | POA: Diagnosis not present

## 2016-12-05 DIAGNOSIS — R531 Weakness: Secondary | ICD-10-CM | POA: Diagnosis not present

## 2016-12-05 DIAGNOSIS — Z9889 Other specified postprocedural states: Secondary | ICD-10-CM

## 2016-12-05 DIAGNOSIS — S728X9A Other fracture of unspecified femur, initial encounter for closed fracture: Secondary | ICD-10-CM | POA: Diagnosis not present

## 2016-12-05 LAB — CBC WITH DIFFERENTIAL/PLATELET
Basophils Absolute: 0 10*3/uL (ref 0.0–0.1)
Basophils Relative: 0 %
EOS PCT: 0 %
Eosinophils Absolute: 0 10*3/uL (ref 0.0–0.7)
HCT: 38.9 % (ref 36.0–46.0)
Hemoglobin: 12.8 g/dL (ref 12.0–15.0)
LYMPHS ABS: 0.8 10*3/uL (ref 0.7–4.0)
LYMPHS PCT: 15 %
MCH: 29.2 pg (ref 26.0–34.0)
MCHC: 32.9 g/dL (ref 30.0–36.0)
MCV: 88.8 fL (ref 78.0–100.0)
MONO ABS: 0.9 10*3/uL (ref 0.1–1.0)
MONOS PCT: 16 %
Neutro Abs: 4 10*3/uL (ref 1.7–7.7)
Neutrophils Relative %: 69 %
PLATELETS: 176 10*3/uL (ref 150–400)
RBC: 4.38 MIL/uL (ref 3.87–5.11)
RDW: 14 % (ref 11.5–15.5)
WBC: 5.8 10*3/uL (ref 4.0–10.5)

## 2016-12-05 LAB — I-STAT TROPONIN, ED: Troponin i, poc: 0.02 ng/mL (ref 0.00–0.08)

## 2016-12-05 LAB — COMPREHENSIVE METABOLIC PANEL
ALT: 42 U/L (ref 14–54)
AST: 82 U/L — ABNORMAL HIGH (ref 15–41)
Albumin: 3.8 g/dL (ref 3.5–5.0)
Alkaline Phosphatase: 46 U/L (ref 38–126)
Anion gap: 8 (ref 5–15)
BUN: 14 mg/dL (ref 6–20)
CHLORIDE: 96 mmol/L — AB (ref 101–111)
CO2: 28 mmol/L (ref 22–32)
CREATININE: 0.89 mg/dL (ref 0.44–1.00)
Calcium: 8.3 mg/dL — ABNORMAL LOW (ref 8.9–10.3)
GFR, EST NON AFRICAN AMERICAN: 57 mL/min — AB (ref >60)
Glucose, Bld: 95 mg/dL (ref 65–99)
Potassium: 4.9 mmol/L (ref 3.5–5.1)
Sodium: 132 mmol/L — ABNORMAL LOW (ref 135–145)
Total Bilirubin: 1.2 mg/dL (ref 0.3–1.2)
Total Protein: 6.9 g/dL (ref 6.5–8.1)

## 2016-12-05 LAB — TYPE AND SCREEN
ABO/RH(D): A POS
Antibody Screen: NEGATIVE

## 2016-12-05 LAB — INFLUENZA PANEL BY PCR (TYPE A & B)
Influenza A By PCR: POSITIVE — AB
Influenza B By PCR: NEGATIVE

## 2016-12-05 LAB — PROTIME-INR
INR: 1.1
PROTHROMBIN TIME: 14.2 s (ref 11.4–15.2)

## 2016-12-05 LAB — APTT: aPTT: 33 seconds (ref 24–36)

## 2016-12-05 MED ORDER — SENNOSIDES-DOCUSATE SODIUM 8.6-50 MG PO TABS: 1.0000 | ORAL_TABLET | Freq: Every evening | ORAL | Status: DC | PRN

## 2016-12-05 MED ORDER — TIMOLOL MALEATE 0.25 % OP SOLN
1.0000 [drp] | Freq: Two times a day (BID) | OPHTHALMIC | Status: DC
  Administered 2016-12-05 – 2016-12-10 (×9): 1 [drp] via OPHTHALMIC
  Filled 2016-12-05: qty 5

## 2016-12-05 MED ORDER — BISACODYL 5 MG PO TBEC: 5.0000 mg | DELAYED_RELEASE_TABLET | Freq: Every day | ORAL | Status: DC | PRN

## 2016-12-05 MED ORDER — CALCIUM CARBONATE-VITAMIN D 500-200 MG-UNIT PO TABS
1.0000 | ORAL_TABLET | Freq: Every day | ORAL | Status: DC
  Administered 2016-12-07: 08:00:00 1 via ORAL
  Filled 2016-12-05: qty 1

## 2016-12-05 MED ORDER — VITAMIN D3 25 MCG (1000 UNIT) PO TABS
4000.0000 [IU] | ORAL_TABLET | Freq: Every day | ORAL | Status: DC
  Administered 2016-12-05 – 2016-12-09 (×5): 4000 [IU] via ORAL
  Filled 2016-12-05 (×6): qty 4

## 2016-12-05 MED ORDER — CITALOPRAM HYDROBROMIDE 20 MG PO TABS
40.0000 mg | ORAL_TABLET | Freq: Every day | ORAL | Status: DC
  Administered 2016-12-05 – 2016-12-09 (×5): 40 mg via ORAL
  Filled 2016-12-05 (×3): qty 2
  Filled 2016-12-05: qty 4
  Filled 2016-12-05: qty 2

## 2016-12-05 MED ORDER — DONEPEZIL HCL 10 MG PO TABS
10.0000 mg | ORAL_TABLET | Freq: Every day | ORAL | Status: DC
  Administered 2016-12-05 – 2016-12-09 (×5): 10 mg via ORAL
  Filled 2016-12-05 (×2): qty 1
  Filled 2016-12-05: qty 2
  Filled 2016-12-05 (×2): qty 1

## 2016-12-05 MED ORDER — PROSIGHT PO TABS
1.0000 | ORAL_TABLET | Freq: Every day | ORAL | Status: DC
  Administered 2016-12-05 – 2016-12-09 (×5): 1 via ORAL
  Filled 2016-12-05 (×5): qty 1

## 2016-12-05 MED ORDER — HYDROCODONE-ACETAMINOPHEN 5-325 MG PO TABS
1.0000 | ORAL_TABLET | ORAL | Status: AC | PRN
  Administered 2016-12-05: 2 via ORAL
  Filled 2016-12-05: qty 2

## 2016-12-05 MED ORDER — MORPHINE SULFATE (PF) 2 MG/ML IV SOLN
2.0000 mg | INTRAVENOUS | Status: DC | PRN
  Administered 2016-12-05: 2 mg via INTRAVENOUS

## 2016-12-05 MED ORDER — VITAMIN D 50 MCG (2000 UT) PO CAPS: 4000.0000 [IU] | ORAL_CAPSULE | Freq: Every day | ORAL | Status: DC

## 2016-12-05 MED ORDER — FENTANYL CITRATE (PF) 100 MCG/2ML IJ SOLN
50.0000 ug | Freq: Once | INTRAMUSCULAR | Status: AC
  Administered 2016-12-05: 50 ug via INTRAVENOUS
  Filled 2016-12-05: qty 2

## 2016-12-05 MED ORDER — OMEGA-3-ACID ETHYL ESTERS 1 G PO CAPS
1.0000 g | ORAL_CAPSULE | Freq: Every day | ORAL | Status: DC
  Administered 2016-12-07 – 2016-12-10 (×4): 1 g via ORAL
  Filled 2016-12-05 (×5): qty 1

## 2016-12-05 MED ORDER — ONDANSETRON HCL 4 MG/2ML IJ SOLN: 4.0000 mg | Freq: Three times a day (TID) | INTRAMUSCULAR | Status: DC | PRN

## 2016-12-05 MED ORDER — CALCIUM CARBONATE-VITAMIN D 600-400 MG-UNIT PO TABS: 1.0000 | ORAL_TABLET | Freq: Every day | ORAL | Status: DC

## 2016-12-05 MED ORDER — SODIUM CHLORIDE 0.9 % IV SOLN
INTRAVENOUS | Status: DC
  Administered 2016-12-05: 22:00:00 via INTRAVENOUS

## 2016-12-05 MED ORDER — ZOLPIDEM TARTRATE 5 MG PO TABS
5.0000 mg | ORAL_TABLET | Freq: Every evening | ORAL | Status: DC | PRN
Start: 2016-12-05 — End: 2016-12-10

## 2016-12-05 MED ORDER — MORPHINE SULFATE (PF) 2 MG/ML IV SOLN
INTRAVENOUS | Status: AC
  Filled 2016-12-05: qty 1

## 2016-12-05 MED ORDER — METHOCARBAMOL 500 MG PO TABS
500.0000 mg | ORAL_TABLET | Freq: Three times a day (TID) | ORAL | Status: DC | PRN
  Administered 2016-12-05 – 2016-12-10 (×6): 500 mg via ORAL
  Filled 2016-12-05 (×6): qty 1

## 2016-12-05 MED ORDER — ICAPS MV PO TABS: 1.0000 | ORAL_TABLET | Freq: Every day | ORAL | Status: DC

## 2016-12-05 MED ORDER — TIMOLOL HEMIHYDRATE 0.25 % OP SOLN: 1.0000 [drp] | Freq: Two times a day (BID) | OPHTHALMIC | Status: DC

## 2016-12-05 MED ORDER — ALBUTEROL SULFATE (2.5 MG/3ML) 0.083% IN NEBU
2.5000 mg | INHALATION_SOLUTION | Freq: Four times a day (QID) | RESPIRATORY_TRACT | Status: DC | PRN
  Administered 2016-12-06 – 2016-12-09 (×3): 2.5 mg via RESPIRATORY_TRACT
  Filled 2016-12-05 (×3): qty 3

## 2016-12-05 MED ORDER — ATORVASTATIN CALCIUM 20 MG PO TABS
20.0000 mg | ORAL_TABLET | Freq: Every day | ORAL | Status: DC
  Administered 2016-12-05 – 2016-12-09 (×5): 20 mg via ORAL
  Filled 2016-12-05 (×5): qty 1

## 2016-12-05 MED ORDER — DM-GUAIFENESIN ER 30-600 MG PO TB12
1.0000 | ORAL_TABLET | Freq: Two times a day (BID) | ORAL | Status: DC
  Administered 2016-12-05: 1 via ORAL
  Filled 2016-12-05 (×2): qty 1

## 2016-12-05 MED ORDER — MEMANTINE HCL 10 MG PO TABS
5.0000 mg | ORAL_TABLET | Freq: Every day | ORAL | Status: DC
  Administered 2016-12-05 – 2016-12-09 (×5): 5 mg via ORAL
  Filled 2016-12-05 (×5): qty 1

## 2016-12-05 NOTE — ED Notes (Signed)
Patient returned from radiology, denies pain at this time.

## 2016-12-05 NOTE — ED Notes (Signed)
Floor Unit RN was called again at this time but stated she is "admitting a PACU patient" at this time and not ready to take two patients at the same time; stated that she will call ED when she is ready.

## 2016-12-05 NOTE — ED Provider Notes (Signed)
[facility]-EMERGENCY DEPT Provider Note   CSN: [id number] Arrival date & time: 12/05/16  1654     History   Chief Complaint Chief Complaint  Patient presents with  . Hip Pain    right    HPI Gabrielle [name] is a 83 y.o. female.  The history is provided by the patient and medical records.  Hip Pain     83 year old female with history of arthritis, osteoporosis, glaucoma, hyperlipidemia, aortic insufficiency, mitral regurgitation, presenting to the ED for right hip pain after a fall. Patient lives at a retirement facility and reports she got dizzy today and fell.  States she does not remember hitting her head or extension using loss of consciousness, however cannot entirely remember. She reports immediate onset of left hip pain. On arrival to ED, hip is bound with a sheet.  Right hip is externally rotated and shortened. She denies any numbness of her right leg. Patient underwent left hip arthroplasty in 2017 with Dr. Ophelia Charter. She is not currently on anticoagulation. She did recently start a new medication for dementia per her granddaughter.  Patient only takes daily ASA.  Past Medical History:  Diagnosis Date  . Arthritis    oa  . Glaucoma 08/04/2015  . Hyperlipidemia   . Mild aortic insufficiency: Per 2 d echo 08/04/2015 08/04/2015  . Moderate mitral regurgitation: Per 2 d echo 08/04/2015 08/04/2015    Patient Active Problem List   Diagnosis Date Noted  . S/P hip hemiarthroplasty 09/16/2016  . Mallet finger, left small finger 09/16/2016  . Hip fracture (HCC) 08/30/2016  . Vascular dementia 08/30/2016  . Osteoporosis 08/30/2016  . Vaginal candida 08/30/2016  . UTI (urinary tract infection) 08/30/2016  . Mild aortic insufficiency: Per 2 d echo 08/04/2015 08/04/2015  . Moderate mitral regurgitation: Per 2 d echo 08/04/2015 08/04/2015  . Glaucoma 08/04/2015  . Orthostasis: Mild 08/04/2015  . Syncope 08/03/2015  . Depression 12/09/2013  . Constipation 11/30/2013  . Pure  hypercholesterolemia 11/30/2013  . Acute posthemorrhagic anemia 11/30/2013  . OA (osteoarthritis) of knee 11/21/2013    Past Surgical History:  Procedure Laterality Date  . ABDOMINAL HYSTERECTOMY    . benign breast tumor removed  age 66  . HIP ARTHROPLASTY Left 08/31/2016   Procedure: ARTHROPLASTY  HIP (HEMIARTHROPLASTY);  Surgeon: Eldred Manges, MD;  Location: [facility] OR;  Service: Orthopedics;  Laterality: Left;  . TONSILLECTOMY  age 39  . TOTAL KNEE ARTHROPLASTY Right 11/21/2013   Procedure: RIGHT TOTAL KNEE ARTHROPLASTY;  Surgeon: Loanne Drilling, MD;  Location: [facility] ORS;  Service: Orthopedics;  Laterality: Right;  . WRIST FRACTURE SURGERY Right     OB History    No data available       Home Medications    Prior to Admission medications   Medication Sig Start Date End Date Taking? Authorizing Provider  aspirin 81 MG tablet Take 81 mg by mouth at bedtime.     Historical Provider, MD  atorvastatin (LIPITOR) 20 MG tablet Take 20 mg by mouth at bedtime.     Historical Provider, MD  bisacodyl (DULCOLAX) 5 MG EC tablet Take 1 tablet (5 mg total) by mouth daily as needed for moderate constipation. 09/02/16   Lenox Ponds, MD  Calcium Carb-Cholecalciferol (CALCIUM 600 + D PO) Take 1 tablet by mouth 2 (two) times daily.    Historical Provider, MD  Cholecalciferol (VITAMIN D) 2000 UNITS CAPS Take 4,000 Units by mouth at bedtime.     Historical Provider, MD  citalopram (CELEXA)  40 MG tablet Take 40 mg by mouth at bedtime.     Historical Provider, MD  donepezil (ARICEPT) 10 MG tablet Take 10 mg by mouth at bedtime.  06/19/15   Historical Provider, MD  enoxaparin (LOVENOX) 40 MG/0.4ML injection Inject 0.4 mLs (40 mg total) into the skin daily. 09/01/16   Naida SleightJames M Owens, PA-C  HYDROcodone-acetaminophen (NORCO) 5-325 MG tablet Take 1 tablet by mouth every 6 (six) hours as needed for moderate pain. 09/01/16   Naida SleightJames M Owens, PA-C  memantine (NAMENDA) 5 MG tablet Take 5 mg by mouth at bedtime.  08/06/16   Historical Provider, MD  Multiple Vitamins-Minerals (ICAPS MV) TABS Take 1 tablet by mouth at bedtime.     Historical Provider, MD  Omega-3 Fatty Acids (FISH OIL) 1000 MG CAPS Take 1,000 mg by mouth at bedtime.     Historical Provider, MD  senna-docusate (SENOKOT-S) 8.6-50 MG per tablet Take 1 tablet by mouth at bedtime. 08/04/15   Rodolph Bonganiel Thompson V, MD  timolol (BETIMOL) 0.25 % ophthalmic solution Place 1 drop into both eyes 2 (two) times daily.     Historical Provider, MD    Family History History reviewed. No pertinent family history.  Social History Social History  Substance Use Topics  . Smoking status: Former Smoker    Packs/day: 1.00    Years: 17.00    Types: Cigarettes    Quit date: 11/10/1965  . Smokeless tobacco: Never Used  . Alcohol use Yes     Comment: very occasional wine     Allergies   Patient has no known allergies.   Review of Systems Review of Systems  Musculoskeletal: Positive for arthralgias.  All other systems reviewed and are negative.    Physical Exam Updated Vital Signs BP 128/58 (BP Location: Left Arm)   Pulse 60   Temp 98.2 F (36.8 C) (Oral)   Resp 17   Ht 5\' 9"  (1.753 m)   Wt 71.7 kg   SpO2 98%   BMI 23.33 kg/m   Physical Exam  Constitutional: She is oriented to person, place, and time. She appears well-developed and well-nourished.  HENT:  Head: Normocephalic and atraumatic.  Mouth/Throat: Oropharynx is clear and moist.  Eyes: Conjunctivae and EOM are normal. Pupils are equal, round, and reactive to light.  Neck:  Towel wrapped around neck  Cardiovascular: Normal rate, regular rhythm and normal heart sounds.   Pulmonary/Chest: Effort normal and breath sounds normal. She has no decreased breath sounds. She has no wheezes. She has no rhonchi.  Wet cough on exam  Abdominal: Soft. Bowel sounds are normal.  Musculoskeletal: Normal range of motion.  Pelvis is bound with sheet; left leg is externally rotated and shortened a  few inches; DP pulse intact Left leg non-tender  Neurological: She is alert and oriented to person, place, and time.  Skin: Skin is warm and dry.  Psychiatric: She has a normal mood and affect.  Nursing note and vitals reviewed.    ED Treatments / Results  Labs (all labs ordered are listed, but only abnormal results are displayed) Labs Reviewed  COMPREHENSIVE METABOLIC PANEL - Abnormal; Notable for the following:       Result Value   Sodium 132 (*)    Chloride 96 (*)    Calcium 8.3 (*)    AST 82 (*)    GFR calc non Af Amer 57 (*)    All other components within normal limits  CBC WITH DIFFERENTIAL/PLATELET  PROTIME-INR  URINALYSIS, ROUTINE W  REFLEX MICROSCOPIC  INFLUENZA PANEL BY PCR (TYPE A & B)  APTT  CBC  BASIC METABOLIC PANEL  I-STAT TROPOININ, ED  TYPE AND SCREEN    EKG  EKG Interpretation None       Radiology Dg Chest 1 View  Result Date: 12/05/2016 CLINICAL DATA:  Preoperative chest radiograph, for hip fracture. Cough. Initial encounter. EXAM: CHEST 1 VIEW COMPARISON:  Chest radiograph performed 08/30/2016 FINDINGS: The lungs are well-aerated and clear. There is no evidence of focal opacification, pleural effusion or pneumothorax. The cardiomediastinal silhouette is borderline normal in size. No acute osseous abnormalities are seen. Right convex thoracic scoliosis is noted. IMPRESSION: No acute cardiopulmonary process seen. Right convex thoracic scoliosis noted. No displaced rib fractures identified. Electronically Signed   By: Roanna Raider M.D.   On: 12/05/2016 19:13   Ct Head Wo Contrast  Result Date: 12/05/2016 CLINICAL DATA:  Dizziness with subsequent fall. EXAM: CT HEAD WITHOUT CONTRAST CT CERVICAL SPINE WITHOUT CONTRAST TECHNIQUE: Multidetector CT imaging of the head and cervical spine was performed following the standard protocol without intravenous contrast. Multiplanar CT image reconstructions of the cervical spine were also generated. COMPARISON:   08/03/2015 FINDINGS: CT HEAD FINDINGS Brain: Ventricles and cisterns are within normal. There is mild age related atrophic change. There is no mass, mass effect, shift of midline structures or acute hemorrhage. No evidence of acute infarction. Minimal chronic ischemic microvascular disease. Vascular: Mild plaque over the cavernous segment of the internal carotid arteries. Skull: Within normal. Sinuses/Orbits: Orbits are within normal. Paranasal sinuses are well developed and well aerated without air-fluid levels or significant opacification. Other: None. CT CERVICAL SPINE FINDINGS Alignment:  Within normal. Skull base and vertebrae: Vertebral body alignment and heights are within normal. There is mild spondylosis throughout the cervical spine. There is moderate uncovertebral joint spurring and mild facet arthropathy. No acute fracture. Soft tissues and spinal canal: Prevertebral soft tissues are within normal. Spinal canal is within normal. Disc levels: Mild disc space narrowing at the C3-4, C4-5, C5-6 and C6-7 levels. Upper chest: Within normal. Other: None. IMPRESSION: No acute intracranial findings. Minimal chronic ischemic microvascular disease. No acute cervical spine injury. Mild spondylosis throughout the cervical spine with mild multilevel disc disease. Electronically Signed   By: Elberta Fortis M.D.   On: 12/05/2016 19:02   Ct Cervical Spine Wo Contrast  Result Date: 12/05/2016 CLINICAL DATA:  Dizziness with subsequent fall. EXAM: CT HEAD WITHOUT CONTRAST CT CERVICAL SPINE WITHOUT CONTRAST TECHNIQUE: Multidetector CT imaging of the head and cervical spine was performed following the standard protocol without intravenous contrast. Multiplanar CT image reconstructions of the cervical spine were also generated. COMPARISON:  08/03/2015 FINDINGS: CT HEAD FINDINGS Brain: Ventricles and cisterns are within normal. There is mild age related atrophic change. There is no mass, mass effect, shift of midline  structures or acute hemorrhage. No evidence of acute infarction. Minimal chronic ischemic microvascular disease. Vascular: Mild plaque over the cavernous segment of the internal carotid arteries. Skull: Within normal. Sinuses/Orbits: Orbits are within normal. Paranasal sinuses are well developed and well aerated without air-fluid levels or significant opacification. Other: None. CT CERVICAL SPINE FINDINGS Alignment:  Within normal. Skull base and vertebrae: Vertebral body alignment and heights are within normal. There is mild spondylosis throughout the cervical spine. There is moderate uncovertebral joint spurring and mild facet arthropathy. No acute fracture. Soft tissues and spinal canal: Prevertebral soft tissues are within normal. Spinal canal is within normal. Disc levels: Mild disc space narrowing at the C3-4,  C4-5, C5-6 and C6-7 levels. Upper chest: Within normal. Other: None. IMPRESSION: No acute intracranial findings. Minimal chronic ischemic microvascular disease. No acute cervical spine injury. Mild spondylosis throughout the cervical spine with mild multilevel disc disease. Electronically Signed   By: Elberta Fortis M.D.   On: 12/05/2016 19:02   Dg Hip Unilat  With Pelvis 2-3 Views Right  Result Date: 12/05/2016 CLINICAL DATA:  Acute onset of syncope and fall. Right hip pain. Initial encounter. EXAM: DG HIP (WITH OR WITHOUT PELVIS) 2-3V RIGHT COMPARISON:  None. FINDINGS: There is a mildly displaced subcapital fracture through the right femoral neck, with superior displacement of the distal femur. The right femoral head remains seated at the acetabulum. The left hip arthroplasty is grossly unremarkable. Mild degenerative change is noted at the lower lumbar spine. The sacroiliac joints are unremarkable in appearance. The visualized bowel gas pattern is grossly unremarkable in appearance. IMPRESSION: Mildly displaced subcapital fracture through the right femoral neck, with superior displacement of the  distal femur. Electronically Signed   By: Roanna Raider M.D.   On: 12/05/2016 17:58    Procedures Procedures (including critical care time)  Medications Ordered in ED Medications  HYDROcodone-acetaminophen (NORCO/VICODIN) 5-325 MG per tablet 1-2 tablet (not administered)  ondansetron (ZOFRAN) injection 4 mg (not administered)  morphine 2 MG/ML injection 2 mg (not administered)  senna-docusate (Senokot-S) tablet 1 tablet (not administered)  bisacodyl (DULCOLAX) EC tablet 5 mg (not administered)  memantine (NAMENDA) tablet 5 mg (not administered)  Calcium Carbonate-Vitamin D 600-400 MG-UNIT 1 tablet (not administered)  Vitamin D CAPS 4,000 Units (not administered)  donepezil (ARICEPT) tablet 10 mg (not administered)  ICAPS MV TABS 1 tablet (not administered)  omega-3 acid ethyl esters (LOVAZA) capsule 1 g (not administered)  timolol (BETIMOL) 0.25 % ophthalmic solution 1 drop (not administered)  atorvastatin (LIPITOR) tablet 20 mg (not administered)  citalopram (CELEXA) tablet 40 mg (not administered)  methocarbamol (ROBAXIN) tablet 500 mg (not administered)  0.9 %  sodium chloride infusion (not administered)  zolpidem (AMBIEN) tablet 5 mg (not administered)  dextromethorphan-guaiFENesin (MUCINEX DM) 30-600 MG per 12 hr tablet 1 tablet (not administered)  albuterol (PROVENTIL) (2.5 MG/3ML) 0.083% nebulizer solution 2.5 mg (not administered)  fentaNYL (SUBLIMAZE) injection 50 mcg (50 mcg Intravenous Given 12/05/16 1821)     Initial Impression / Assessment and Plan / ED Course  I have reviewed the triage vital signs and the nursing notes.  Pertinent labs & imaging results that were available during my care of the patient were reviewed by me and considered in my medical decision making (see chart for details).  83 year old female here after a fall. She reports dizziness prior to falling. Fell on the right side. Denies head injury or loss of consciousness. Does have history of dementia.   Patient is awake, alert, appropriately oriented on arrival.  Right leg is externally rotated and shortened by a few inches. Her leg is neurovascularly intact. X-ray confirms femoral neck fracture. No other injuries noted. CT head and cervical spine negative for acute findings.  Able to range neck without issue. Labwork is reassuring. Pre-op CXR and EKG without acute findings.  Granddaughter does report UTI in the past with falls, UA will be obtained here. Discussed with patient's orthopedist, Dr. Ophelia Charter, he will plan for ORIF tomorrow morning. Patient is to be NPO after midnight.  Patient admitted to hospitalist service for ongoing care.  Discussed with Dr. Clyde Lundborg-- will admit to med/surg.  Temp admit orders placed.  Final Clinical Impressions(s) /  ED Diagnoses   Final diagnoses:  Closed right hip fracture, initial encounter [facility])    New Prescriptions New Prescriptions   No medications on file     Oletha Blend 12/05/16 1957    Rolland Porter, MD 12/18/16 [id number]

## 2016-12-05 NOTE — ED Notes (Signed)
Floor Unit RN was called at this time but not ready to take pt at this time; stated that she was getting a PACU pt at this time and will call ED when she is ready.

## 2016-12-05 NOTE — ED Notes (Signed)
Pt cannot use restroom at this time, aware urine specimen is needed.  

## 2016-12-05 NOTE — ED Notes (Signed)
Patient transported to radiology

## 2016-12-05 NOTE — ED Triage Notes (Addendum)
Per EMS, [facility]. Patient with fall, states she got dizzy and caused her to fall. Patient with R hip pain, shortening and rotation. EMS bound patient's pelvis with sheet. Patient was hypotensive when sitting when EMS arrived, was diaphoretic and pale. Patient did not loose consciousness and is able to recall sequence of events.   At time of arrival patient reports she is unsure what happened, she recalls getting dizzy and "slumping" over. Patient is A&Ox4, denies pain at this time. Strong bilateral pedal pulses.

## 2016-12-05 NOTE — ED Notes (Signed)
Bed: [id number] Expected date:  Expected time:  Means of arrival:  Comments: 83 yo F fall hip pain

## 2016-12-05 NOTE — H&P (Addendum)
History and Physical    Gabrielle [name]:[id number] DOB: 1927/04/27 DOA: 12/05/2016  Referring MD/NP/PA:   PCP: Gweneth Dimitri, MD   Patient coming from:  The patient is coming from independent living facility.  At baseline, pt is partially dependent for most of ADL.      Chief Complaint: fall and right hip injury  HPI: Gabrielle [name] is a 83 y.o. female with medical history significant of dementia, hyperlipidemia, anxiety, mitral valve prolapse, aortic valve insufficiency, glaucoma, arthritis, using walker, who presents with fall and right hip injury.  Per pt's son, pt has been mildly sick in the past several days. She has mild dry cough and mild running nose. She also has increased urinary frequency, but no dysuria or burning on urination. No chest pain or shortness of breath. She has nausea, but no vomiting, abdominal pain or diarrhea.  Pt states that she got dizzy in this afternoon, fell when she tried to reach some thing from, injured her right hip, caused severe pain. She did not have unilateral weakness, numbness in extremities, no vision change or hearing loss. Per her son, patient did not have LOC. She denies any numbness of her right leg. Please right hip pain is constant, sharp, 8 out of 10 in severity, nonradiating. Patient underwent left hip arthroplasty in 2017 with Dr. Ophelia Charter.  ED Course: pt was found to have WBC 5.8, INR 1.10, troponin negative, creatinine normal, temperature normal, no tachycardia, oxygen saturation 98% on room air, negative chest x-ray for acute abnormalities, negative CT head and C-spine for acute bony abnormalities. X-ray of her right hip/pelvis showed mildly displaced subcapital fracture through the right femoral neck, with superior displacement of the distal femur. Pt is admitted to MedSurg bed as inpatient. Orthopedic surgeon, Dr. Ophelia Charter  was consulted.   Review of Systems:   General: no fevers, chills, no changes in body weight, has poor  appetite, has fatigue HEENT: no blurry vision, hearing changes or sore throat Respiratory: no dyspnea, has coughing, no wheezing CV: no chest pain, no palpitations GI: has nausea, no vomiting, abdominal pain, diarrhea, constipation GU: no dysuria, burning on urination, increased urinary frequency, hematuria  Ext: no leg edema Neuro: no unilateral weakness, numbness, or tingling, no vision change or hearing loss. Has dizziness and fall. Skin: no rash, no skin tear. MSK: has right hip pain Heme: No easy bruising.  Travel history: No recent long distant travel.  Allergy: No Known Allergies  Past Medical History:  Diagnosis Date  . Arthritis    oa  . Glaucoma 08/04/2015  . Hyperlipidemia   . Mild aortic insufficiency: Per 2 d echo 08/04/2015 08/04/2015  . Moderate mitral regurgitation: Per 2 d echo 08/04/2015 08/04/2015    Past Surgical History:  Procedure Laterality Date  . ABDOMINAL HYSTERECTOMY    . benign breast tumor removed  age 73  . HIP ARTHROPLASTY Left 08/31/2016   Procedure: ARTHROPLASTY  HIP (HEMIARTHROPLASTY);  Surgeon: Eldred Manges, MD;  Location: [facility];  Service: Orthopedics;  Laterality: Left;  . TONSILLECTOMY  age 48  . TOTAL KNEE ARTHROPLASTY Right 11/21/2013   Procedure: RIGHT TOTAL KNEE ARTHROPLASTY;  Surgeon: Loanne Drilling, MD;  Location: [facility];  Service: Orthopedics;  Laterality: Right;  . WRIST FRACTURE SURGERY Right     Social History:  reports that she quit smoking about 51 years ago. Her smoking use included Cigarettes. She has a 17.00 pack-year smoking history. She has never used smokeless tobacco. She reports that she drinks alcohol.  She reports that she does not use drugs.  Family History: History reviewed. No pertinent family history.   Prior to Admission medications   Medication Sig Start Date End Date Taking? Authorizing Provider  aspirin 81 MG tablet Take 81 mg by mouth at bedtime.    Yes Historical Provider, MD  atorvastatin (LIPITOR) 20 MG  tablet Take 20 mg by mouth at bedtime.    Yes Historical Provider, MD  Calcium Carb-Cholecalciferol (CALCIUM 600 + D PO) Take 1 tablet by mouth 2 (two) times daily.   Yes Historical Provider, MD  Cholecalciferol (VITAMIN D) 2000 UNITS CAPS Take 4,000 Units by mouth at bedtime.    Yes Historical Provider, MD  citalopram (CELEXA) 40 MG tablet Take 40 mg by mouth at bedtime.    Yes Historical Provider, MD  donepezil (ARICEPT) 10 MG tablet Take 10 mg by mouth at bedtime.  06/19/15  Yes Historical Provider, MD  memantine (NAMENDA) 5 MG tablet Take 5 mg by mouth at bedtime. 08/06/16  Yes Historical Provider, MD  Multiple Vitamins-Minerals (ICAPS MV) TABS Take 1 tablet by mouth at bedtime.    Yes Historical Provider, MD  Omega-3 Fatty Acids (FISH OIL) 1000 MG CAPS Take 1,000 mg by mouth at bedtime.    Yes Historical Provider, MD  timolol (BETIMOL) 0.25 % ophthalmic solution Place 1 drop into both eyes 2 (two) times daily.    Yes Historical Provider, MD  bisacodyl (DULCOLAX) 5 MG EC tablet Take 1 tablet (5 mg total) by mouth daily as needed for moderate constipation. Patient not taking: Reported on 12/05/2016 09/02/16   Lenox Ponds, MD  enoxaparin (LOVENOX) 40 MG/0.4ML injection Inject 0.4 mLs (40 mg total) into the skin daily. Patient not taking: Reported on 12/05/2016 09/01/16   Naida Sleight, PA-C  HYDROcodone-acetaminophen [facility]) 5-325 MG tablet Take 1 tablet by mouth every 6 (six) hours as needed for moderate pain. Patient not taking: Reported on 12/05/2016 09/01/16   Naida Sleight, PA-C  senna-docusate (SENOKOT-S) 8.6-50 MG per tablet Take 1 tablet by mouth at bedtime. Patient not taking: Reported on 12/05/2016 08/04/15   Rodolph Bong, MD    Physical Exam: Vitals:   12/05/16 1706 12/05/16 1712 12/05/16 1800  BP: 128/58  136/62  Pulse: 60  62  Resp: 17  18  Temp: 98.2 F (36.8 C)    TempSrc: Oral    SpO2: 98%  99%  Weight:  71.7 kg (158 lb)   Height:  5\' 9"  (1.753 m)    General: Not  in acute distress HEENT:       Eyes: PERRL, EOMI, no scleral icterus.       ENT: No discharge from the ears and nose, no pharynx injection, no tonsillar enlargement.        Neck: No JVD, no bruit, no mass felt. Heme: No neck lymph node enlargement. Cardiac: S1/S2, RRR, No murmurs, No gallops or rubs. Respiratory: has mild rhonchi bilaterally. No rales, wheezing or rubs. GI: Soft, nondistended, nontender, no rebound pain, no organomegaly, BS present. GU: No hematuria Ext: No pitting leg edema bilaterally. 2+DP/PT pulse bilaterally. Musculoskeletal:  Right hip is externally rotated and shortened. Has tenderness in right hip. Skin: No rashes.  Neuro: Alert, oriented X3, cranial nerves II-XII grossly intact, moves all extremities. Psych: Patient is not psychotic, no suicidal or hemocidal ideation.  Labs on Admission: I have personally reviewed following labs and imaging studies  CBC:  Recent Labs Lab 12/05/16 1822  WBC 5.8  NEUTROABS  4.0  HGB 12.8  HCT 38.9  MCV 88.8  PLT 176   Basic Metabolic Panel:  Recent Labs Lab 12/05/16 1822  NA 132*  K 4.9  CL 96*  CO2 28  GLUCOSE 95  BUN 14  CREATININE 0.89  CALCIUM 8.3*   GFR: Estimated Creatinine Clearance: 46.5 mL/min (by C-G formula based on SCr of 0.89 mg/dL). Liver Function Tests:  Recent Labs Lab 12/05/16 1822  AST 82*  ALT 42  ALKPHOS 46  BILITOT 1.2  PROT 6.9  ALBUMIN 3.8   No results for input(s): LIPASE, AMYLASE in the last 168 hours. No results for input(s): AMMONIA in the last 168 hours. Coagulation Profile:  Recent Labs Lab 12/05/16 1822  INR 1.10   Cardiac Enzymes: No results for input(s): CKTOTAL, CKMB, CKMBINDEX, TROPONINI in the last 168 hours. BNP (last 3 results) No results for input(s): PROBNP in the last 8760 hours. HbA1C: No results for input(s): HGBA1C in the last 72 hours. CBG: No results for input(s): GLUCAP in the last 168 hours. Lipid Profile: No results for input(s): CHOL,  HDL, LDLCALC, TRIG, CHOLHDL, LDLDIRECT in the last 72 hours. Thyroid Function Tests: No results for input(s): TSH, T4TOTAL, FREET4, T3FREE, THYROIDAB in the last 72 hours. Anemia Panel: No results for input(s): VITAMINB12, FOLATE, FERRITIN, TIBC, IRON, RETICCTPCT in the last 72 hours. Urine analysis:    Component Value Date/Time   COLORURINE YELLOW 08/30/2016 1317   APPEARANCEUR CLOUDY (A) 08/30/2016 1317   LABSPEC 1.010 08/30/2016 1317   PHURINE 8.5 (H) 08/30/2016 1317   GLUCOSEU NEGATIVE 08/30/2016 1317   HGBUR NEGATIVE 08/30/2016 1317   BILIRUBINUR NEGATIVE 08/30/2016 1317   KETONESUR 15 (A) 08/30/2016 1317   PROTEINUR NEGATIVE 08/30/2016 1317   UROBILINOGEN 0.2 08/03/2015 1540   NITRITE NEGATIVE 08/30/2016 1317   LEUKOCYTESUR TRACE (A) 08/30/2016 1317   Sepsis Labs: @LABRCNTIP (procalcitonin:4,lacticidven:4) )No results found for this or any previous visit (from the past 240 hour(s)).   Radiological Exams on Admission: Dg Chest 1 View  Result Date: 12/05/2016 CLINICAL DATA:  Preoperative chest radiograph, for hip fracture. Cough. Initial encounter. EXAM: CHEST 1 VIEW COMPARISON:  Chest radiograph performed 08/30/2016 FINDINGS: The lungs are well-aerated and clear. There is no evidence of focal opacification, pleural effusion or pneumothorax. The cardiomediastinal silhouette is borderline normal in size. No acute osseous abnormalities are seen. Right convex thoracic scoliosis is noted. IMPRESSION: No acute cardiopulmonary process seen. Right convex thoracic scoliosis noted. No displaced rib fractures identified. Electronically Signed   By: Roanna Raider M.D.   On: 12/05/2016 19:13   Ct Head Wo Contrast  Result Date: 12/05/2016 CLINICAL DATA:  Dizziness with subsequent fall. EXAM: CT HEAD WITHOUT CONTRAST CT CERVICAL SPINE WITHOUT CONTRAST TECHNIQUE: Multidetector CT imaging of the head and cervical spine was performed following the standard protocol without intravenous contrast.  Multiplanar CT image reconstructions of the cervical spine were also generated. COMPARISON:  08/03/2015 FINDINGS: CT HEAD FINDINGS Brain: Ventricles and cisterns are within normal. There is mild age related atrophic change. There is no mass, mass effect, shift of midline structures or acute hemorrhage. No evidence of acute infarction. Minimal chronic ischemic microvascular disease. Vascular: Mild plaque over the cavernous segment of the internal carotid arteries. Skull: Within normal. Sinuses/Orbits: Orbits are within normal. Paranasal sinuses are well developed and well aerated without air-fluid levels or significant opacification. Other: None. CT CERVICAL SPINE FINDINGS Alignment:  Within normal. Skull base and vertebrae: Vertebral body alignment and heights are within normal. There is mild spondylosis  throughout the cervical spine. There is moderate uncovertebral joint spurring and mild facet arthropathy. No acute fracture. Soft tissues and spinal canal: Prevertebral soft tissues are within normal. Spinal canal is within normal. Disc levels: Mild disc space narrowing at the C3-4, C4-5, C5-6 and C6-7 levels. Upper chest: Within normal. Other: None. IMPRESSION: No acute intracranial findings. Minimal chronic ischemic microvascular disease. No acute cervical spine injury. Mild spondylosis throughout the cervical spine with mild multilevel disc disease. Electronically Signed   By: Elberta Fortisaniel  Boyle M.D.   On: 12/05/2016 19:02   Ct Cervical Spine Wo Contrast  Result Date: 12/05/2016 CLINICAL DATA:  Dizziness with subsequent fall. EXAM: CT HEAD WITHOUT CONTRAST CT CERVICAL SPINE WITHOUT CONTRAST TECHNIQUE: Multidetector CT imaging of the head and cervical spine was performed following the standard protocol without intravenous contrast. Multiplanar CT image reconstructions of the cervical spine were also generated. COMPARISON:  08/03/2015 FINDINGS: CT HEAD FINDINGS Brain: Ventricles and cisterns are within normal. There  is mild age related atrophic change. There is no mass, mass effect, shift of midline structures or acute hemorrhage. No evidence of acute infarction. Minimal chronic ischemic microvascular disease. Vascular: Mild plaque over the cavernous segment of the internal carotid arteries. Skull: Within normal. Sinuses/Orbits: Orbits are within normal. Paranasal sinuses are well developed and well aerated without air-fluid levels or significant opacification. Other: None. CT CERVICAL SPINE FINDINGS Alignment:  Within normal. Skull base and vertebrae: Vertebral body alignment and heights are within normal. There is mild spondylosis throughout the cervical spine. There is moderate uncovertebral joint spurring and mild facet arthropathy. No acute fracture. Soft tissues and spinal canal: Prevertebral soft tissues are within normal. Spinal canal is within normal. Disc levels: Mild disc space narrowing at the C3-4, C4-5, C5-6 and C6-7 levels. Upper chest: Within normal. Other: None. IMPRESSION: No acute intracranial findings. Minimal chronic ischemic microvascular disease. No acute cervical spine injury. Mild spondylosis throughout the cervical spine with mild multilevel disc disease. Electronically Signed   By: Elberta Fortisaniel  Boyle M.D.   On: 12/05/2016 19:02   Dg Hip Unilat  With Pelvis 2-3 Views Right  Result Date: 12/05/2016 CLINICAL DATA:  Acute onset of syncope and fall. Right hip pain. Initial encounter. EXAM: DG HIP (WITH OR WITHOUT PELVIS) 2-3V RIGHT COMPARISON:  None. FINDINGS: There is a mildly displaced subcapital fracture through the right femoral neck, with superior displacement of the distal femur. The right femoral head remains seated at the acetabulum. The left hip arthroplasty is grossly unremarkable. Mild degenerative change is noted at the lower lumbar spine. The sacroiliac joints are unremarkable in appearance. The visualized bowel gas pattern is grossly unremarkable in appearance. IMPRESSION: Mildly displaced  subcapital fracture through the right femoral neck, with superior displacement of the distal femur. Electronically Signed   By: Roanna RaiderJeffery  Chang M.D.   On: 12/05/2016 17:58     EKG: Independently reviewed.  Sinus rhythm, anteroseptal infarction pattern, QTC 451   Assessment/Plan Principal Problem:   Closed right hip fracture, initial encounter [facility](HCC) Active Problems:   Pure hypercholesterolemia   Depression   Dizziness   Dementia   Fall   Cough   Closed right hip fracture, initial encounter [facility](HCC): As evidenced by x-ray. Patient has moderate pain now. No neurovascular compromise. Orthopedic surgeon was consulted. Dr. Ophelia CharterYates is planning to do surgery tomrrorw.  - will admit to Med-surg bed as inpt - Pain control: morphine prn and Norco - When necessary Zofran for nausea - Robaxin for muscle spasm - type and cross -  INR/PTT - NPO after MN   Fall and dizziness: Etiology is not clear. Likely due to multifactorial etiology, including possible upper respiratory viral infection, UTI, deconditioning, dementia. No focal neurological findings, less likely to have stroke. CT of the head and C-spine negative for acute abnormalities. -check orthostatic vital signs - IVF: NS 125 cc/h - pt/ot - f/u UA to r/o UTI  HLD: Last LDL was not on record -Continue home medications: lipitor  Cough: CXR negative. -check flu pcr -When necessary Mucinex for cough -When necessary albuterol nebulizers for shortness of breath  Addendum: Flu pcr positive for Flu A -will add Tamiflu  Dementia: No behavior change. -Continue Namenda, donepezil  Depression: Stable, no suicidal or homicidal ideations. -Continue home medications: Celexa   DVT ppx: SCD Code Status: Full code Family Communication: Yes, patient's  son  at bed side Disposition Plan:  Anticipate discharge to rehabilitation facility  Consults called:  [place], Dr. Ophelia Charter Admission status:   medical floor/obs   Date of Service 12/05/2016     Lorretta Harp [facility] Pager [phone]  If 7PM-7AM, please contact night-coverage [email or web address] Password [email or web address] 12/05/2016, 8:05 PM

## 2016-12-05 NOTE — ED Notes (Signed)
Family at bedside. 

## 2016-12-06 ENCOUNTER — Inpatient Hospital Stay (HOSPITAL_COMMUNITY): Payer: Medicare Other

## 2016-12-06 ENCOUNTER — Inpatient Hospital Stay (HOSPITAL_COMMUNITY): Payer: Medicare Other | Admitting: Certified Registered Nurse Anesthetist

## 2016-12-06 ENCOUNTER — Encounter (HOSPITAL_COMMUNITY)
Admission: EM | Disposition: A | Discharge status: Skilled Nursing Facility | Payer: Self-pay | Source: Home / Self Care | Attending: Internal Medicine

## 2016-12-06 DIAGNOSIS — F329 Major depressive disorder, single episode, unspecified: Secondary | ICD-10-CM

## 2016-12-06 DIAGNOSIS — J101 Influenza due to other identified influenza virus with other respiratory manifestations: Secondary | ICD-10-CM

## 2016-12-06 DIAGNOSIS — S72001A Fracture of unspecified part of neck of right femur, initial encounter for closed fracture: Secondary | ICD-10-CM

## 2016-12-06 HISTORY — PX: HIP ARTHROPLASTY: SHX981

## 2016-12-06 LAB — BASIC METABOLIC PANEL
ANION GAP: 5 (ref 5–15)
BUN: 13 mg/dL (ref 6–20)
CALCIUM: 8.4 mg/dL — AB (ref 8.9–10.3)
CHLORIDE: 98 mmol/L — AB (ref 101–111)
CO2: 31 mmol/L (ref 22–32)
CREATININE: 0.74 mg/dL (ref 0.44–1.00)
GFR calc non Af Amer: >60 mL/min (ref >60)
Glucose, Bld: 88 mg/dL (ref 65–99)
Potassium: 3.8 mmol/L (ref 3.5–5.1)
SODIUM: 134 mmol/L — AB (ref 135–145)

## 2016-12-06 LAB — CBC
HEMATOCRIT: 36.3 % (ref 36.0–46.0)
HEMOGLOBIN: 11.9 g/dL — AB (ref 12.0–15.0)
MCH: 29.9 pg (ref 26.0–34.0)
MCHC: 32.8 g/dL (ref 30.0–36.0)
MCV: 91.2 fL (ref 78.0–100.0)
Platelets: 143 10*3/uL — ABNORMAL LOW (ref 150–400)
RBC: 3.98 MIL/uL (ref 3.87–5.11)
RDW: 14.3 % (ref 11.5–15.5)
WBC: 6 10*3/uL (ref 4.0–10.5)

## 2016-12-06 LAB — URINALYSIS, ROUTINE W REFLEX MICROSCOPIC
BILIRUBIN URINE: NEGATIVE
Bacteria, UA: NONE SEEN
Glucose, UA: NEGATIVE mg/dL
Ketones, ur: NEGATIVE mg/dL
Nitrite: NEGATIVE
PH: 5 (ref 5.0–8.0)
Protein, ur: NEGATIVE mg/dL
SPECIFIC GRAVITY, URINE: 1.013 (ref 1.005–1.030)
SQUAMOUS EPITHELIAL / LPF: NONE SEEN

## 2016-12-06 LAB — MRSA PCR SCREENING: MRSA by PCR: NEGATIVE

## 2016-12-06 SURGERY — HEMIARTHROPLASTY, HIP, DIRECT ANTERIOR APPROACH, FOR FRACTURE
Anesthesia: Spinal | Site: Hip | Laterality: Right

## 2016-12-06 MED ORDER — 0.9 % SODIUM CHLORIDE (POUR BTL) OPTIME
TOPICAL | Status: DC | PRN
  Administered 2016-12-06: 1000 mL

## 2016-12-06 MED ORDER — BUPIVACAINE HCL (PF) 0.25 % IJ SOLN
INTRAMUSCULAR | Status: DC | PRN
  Administered 2016-12-06: 20 mL

## 2016-12-06 MED ORDER — PHENOL 1.4 % MT LIQD: 1.0000 | OROMUCOSAL | Status: DC | PRN

## 2016-12-06 MED ORDER — MENTHOL 3 MG MT LOZG: 1.0000 | LOZENGE | OROMUCOSAL | Status: DC | PRN

## 2016-12-06 MED ORDER — METOCLOPRAMIDE HCL 5 MG/ML IJ SOLN: 5.0000 mg | Freq: Three times a day (TID) | INTRAMUSCULAR | Status: DC | PRN

## 2016-12-06 MED ORDER — BUPIVACAINE HCL (PF) 0.25 % IJ SOLN
INTRAMUSCULAR | Status: AC
Start: 2016-12-06 — End: 2016-12-06
  Filled 2016-12-06: qty 30

## 2016-12-06 MED ORDER — FENTANYL CITRATE (PF) 100 MCG/2ML IJ SOLN: 25.0000 ug | INTRAMUSCULAR | Status: DC | PRN

## 2016-12-06 MED ORDER — ACETAMINOPHEN 325 MG PO TABS
650.0000 mg | ORAL_TABLET | Freq: Four times a day (QID) | ORAL | Status: DC | PRN
Start: 2016-12-06 — End: 2016-12-10
  Administered 2016-12-09 – 2016-12-10 (×2): 650 mg via ORAL
  Filled 2016-12-06 (×2): qty 2

## 2016-12-06 MED ORDER — ACETAMINOPHEN 10 MG/ML IV SOLN
INTRAVENOUS | Status: AC
  Filled 2016-12-06: qty 100

## 2016-12-06 MED ORDER — ENOXAPARIN SODIUM 30 MG/0.3ML ~~LOC~~ SOLN
30.0000 mg | SUBCUTANEOUS | Status: DC
  Administered 2016-12-07 – 2016-12-10 (×4): 30 mg via SUBCUTANEOUS
  Filled 2016-12-06 (×4): qty 0.3

## 2016-12-06 MED ORDER — LACTATED RINGERS IV SOLN
INTRAVENOUS | Status: DC | PRN
  Administered 2016-12-06: 08:00:00 via INTRAVENOUS

## 2016-12-06 MED ORDER — MORPHINE SULFATE (PF) 2 MG/ML IV SOLN
0.5000 mg | INTRAVENOUS | Status: DC | PRN
  Filled 2016-12-06: qty 1

## 2016-12-06 MED ORDER — FENTANYL CITRATE (PF) 100 MCG/2ML IJ SOLN
INTRAMUSCULAR | Status: AC
  Filled 2016-12-06: qty 2

## 2016-12-06 MED ORDER — POVIDONE-IODINE 10 % EX SWAB
2.0000 "application " | Freq: Once | CUTANEOUS | Status: AC
  Administered 2016-12-06: 2 via TOPICAL

## 2016-12-06 MED ORDER — DEXTROMETHORPHAN POLISTIREX ER 30 MG/5ML PO SUER
30.0000 mg | Freq: Two times a day (BID) | ORAL | Status: DC | PRN
  Administered 2016-12-06 – 2016-12-08 (×2): 30 mg via ORAL
  Filled 2016-12-06 (×3): qty 5

## 2016-12-06 MED ORDER — HYDROCODONE-ACETAMINOPHEN 5-325 MG PO TABS
1.0000 | ORAL_TABLET | Freq: Four times a day (QID) | ORAL | Status: DC | PRN
  Administered 2016-12-07: 18:00:00 1 via ORAL
  Administered 2016-12-07: 08:00:00 2 via ORAL
  Filled 2016-12-06 (×2): qty 2

## 2016-12-06 MED ORDER — BUPIVACAINE IN DEXTROSE 0.75-8.25 % IT SOLN
INTRATHECAL | Status: DC | PRN
  Administered 2016-12-06: 13.5 mg via INTRATHECAL

## 2016-12-06 MED ORDER — ONDANSETRON HCL 4 MG/2ML IJ SOLN
INTRAMUSCULAR | Status: AC
  Filled 2016-12-06: qty 2

## 2016-12-06 MED ORDER — OSELTAMIVIR PHOSPHATE 30 MG PO CAPS
30.0000 mg | ORAL_CAPSULE | Freq: Two times a day (BID) | ORAL | Status: DC
  Administered 2016-12-06 – 2016-12-10 (×9): 30 mg via ORAL
  Filled 2016-12-06 (×9): qty 1

## 2016-12-06 MED ORDER — DEXAMETHASONE SODIUM PHOSPHATE 10 MG/ML IJ SOLN
INTRAMUSCULAR | Status: AC
Start: 2016-12-06 — End: 2016-12-06
  Filled 2016-12-06: qty 1

## 2016-12-06 MED ORDER — OSELTAMIVIR PHOSPHATE 75 MG PO CAPS
75.0000 mg | ORAL_CAPSULE | Freq: Two times a day (BID) | ORAL | Status: DC
  Filled 2016-12-06: qty 1

## 2016-12-06 MED ORDER — PROPOFOL 500 MG/50ML IV EMUL
INTRAVENOUS | Status: DC | PRN
  Administered 2016-12-06: 15 ug/kg/min via INTRAVENOUS

## 2016-12-06 MED ORDER — CEFAZOLIN SODIUM-DEXTROSE 2-4 GM/100ML-% IV SOLN
2.0000 g | INTRAVENOUS | Status: AC
  Administered 2016-12-06: 2 g via INTRAVENOUS

## 2016-12-06 MED ORDER — KETAMINE HCL 10 MG/ML IJ SOLN
INTRAMUSCULAR | Status: AC
  Filled 2016-12-06: qty 1

## 2016-12-06 MED ORDER — PROPOFOL 10 MG/ML IV BOLUS
INTRAVENOUS | Status: AC
  Filled 2016-12-06: qty 40

## 2016-12-06 MED ORDER — ONDANSETRON HCL 4 MG PO TABS: 4.0000 mg | ORAL_TABLET | Freq: Four times a day (QID) | ORAL | Status: DC | PRN

## 2016-12-06 MED ORDER — ONDANSETRON HCL 4 MG/2ML IJ SOLN
INTRAMUSCULAR | Status: DC | PRN
  Administered 2016-12-06: 4 mg via INTRAVENOUS

## 2016-12-06 MED ORDER — ACETAMINOPHEN 650 MG RE SUPP: 650.0000 mg | Freq: Four times a day (QID) | RECTAL | Status: DC | PRN

## 2016-12-06 MED ORDER — FENTANYL CITRATE (PF) 100 MCG/2ML IJ SOLN
INTRAMUSCULAR | Status: DC | PRN
  Administered 2016-12-06: 25 ug via INTRAVENOUS

## 2016-12-06 MED ORDER — ONDANSETRON HCL 4 MG/2ML IJ SOLN: 4.0000 mg | Freq: Four times a day (QID) | INTRAMUSCULAR | Status: DC | PRN

## 2016-12-06 MED ORDER — METOCLOPRAMIDE HCL 5 MG PO TABS: 5.0000 mg | ORAL_TABLET | Freq: Three times a day (TID) | ORAL | Status: DC | PRN

## 2016-12-06 MED ORDER — CEFAZOLIN SODIUM-DEXTROSE 2-4 GM/100ML-% IV SOLN
INTRAVENOUS | Status: AC
  Filled 2016-12-06: qty 100

## 2016-12-06 MED ORDER — KETAMINE HCL 10 MG/ML IJ SOLN
INTRAMUSCULAR | Status: DC | PRN
  Administered 2016-12-06 (×3): 10 mg via INTRAVENOUS
  Administered 2016-12-06: 5 mg via INTRAVENOUS
  Administered 2016-12-06 (×2): 10 mg via INTRAVENOUS

## 2016-12-06 MED ORDER — SODIUM CHLORIDE 0.45 % IV SOLN
INTRAVENOUS | Status: DC
  Administered 2016-12-06 – 2016-12-07 (×2): via INTRAVENOUS

## 2016-12-06 MED ORDER — CHLORHEXIDINE GLUCONATE 4 % EX LIQD
60.0000 mL | Freq: Once | CUTANEOUS | Status: AC
  Administered 2016-12-06: 4 via TOPICAL
  Filled 2016-12-06: qty 60

## 2016-12-06 SURGICAL SUPPLY — 55 items
BLADE SAW SAG 73X25 THK (BLADE) ×1
BLADE SAW SGTL 73X25 THK (BLADE) ×1 IMPLANT
BRUSH FEMORAL CANAL (MISCELLANEOUS) IMPLANT
CAPT HIP HEMI 1 ×1 IMPLANT
DRAPE INCISE IOBAN 66X45 STRL (DRAPES) ×2 IMPLANT
DRAPE ORTHO SPLIT 77X108 STRL (DRAPES) ×4
DRAPE POUCH INSTRU U-SHP 10X18 (DRAPES) ×2 IMPLANT
DRAPE SURG ORHT 6 SPLT 77X108 (DRAPES) ×2 IMPLANT
DRAPE U-SHAPE 47X51 STRL (DRAPES) ×2 IMPLANT
DRAPE WARM FLUID 44X44 (DRAPE) ×2 IMPLANT
DRSG PAD ABDOMINAL 8X10 ST (GAUZE/BANDAGES/DRESSINGS) IMPLANT
DURAPREP 26ML APPLICATOR (WOUND CARE) ×2 IMPLANT
ELECT BLADE TIP CTD 4 INCH (ELECTRODE) ×2 IMPLANT
ELECT REM PT RETURN 9FT ADLT (ELECTROSURGICAL) ×2
ELECTRODE REM PT RTRN 9FT ADLT (ELECTROSURGICAL) ×1 IMPLANT
EVACUATOR 1/8 PVC DRAIN (DRAIN) ×2 IMPLANT
FACESHIELD WRAPAROUND (MASK) ×6 IMPLANT
FACESHIELD WRAPAROUND OR TEAM (MASK) ×5 IMPLANT
GAUZE SPONGE 4X4 12PLY STRL (GAUZE/BANDAGES/DRESSINGS) ×3 IMPLANT
GAUZE XEROFORM 5X9 LF (GAUZE/BANDAGES/DRESSINGS) ×2 IMPLANT
GLOVE BIOGEL PI IND STRL 8 (GLOVE) ×1 IMPLANT
GLOVE BIOGEL PI INDICATOR 8 (GLOVE) ×1
GLOVE ORTHO TXT STRL SZ7.5 (GLOVE) ×2 IMPLANT
GOWN STRL REUS W/TWL LRG LVL3 (GOWN DISPOSABLE) ×2 IMPLANT
HANDPIECE INTERPULSE COAX TIP (DISPOSABLE)
IMMOBILIZER KNEE 20 (SOFTGOODS) ×2
IMMOBILIZER KNEE 20 THIGH 36 (SOFTGOODS) ×1 IMPLANT
KIT BASIN OR (CUSTOM PROCEDURE TRAY) ×2 IMPLANT
NDL MAYO CATGUT SZ4 TPR NDL (NEEDLE) ×1 IMPLANT
NEEDLE HYPO 22GX1.5 SAFETY (NEEDLE) ×1 IMPLANT
NEEDLE MAYO CATGUT SZ4 (NEEDLE) ×2 IMPLANT
NS IRRIG 1000ML POUR BTL (IV SOLUTION) ×4 IMPLANT
PACK TOTAL JOINT (CUSTOM PROCEDURE TRAY) ×2 IMPLANT
PAD ABD 8X10 STRL (GAUZE/BANDAGES/DRESSINGS) ×2 IMPLANT
PASSER SUT SWANSON 36MM LOOP (INSTRUMENTS) ×2 IMPLANT
POSITIONER SURGICAL ARM (MISCELLANEOUS) ×2 IMPLANT
SET HNDPC FAN SPRY TIP SCT (DISPOSABLE) IMPLANT
SPONGE LAP 18X18 X RAY DECT (DISPOSABLE) IMPLANT
STAPLER VISISTAT 35W (STAPLE) ×2 IMPLANT
SUCTION FRAZIER HANDLE 12FR (TUBING) ×1
SUCTION TUBE FRAZIER 12FR DISP (TUBING) ×1 IMPLANT
SUT ETHIBOND NAB CT1 #1 30IN (SUTURE) ×8 IMPLANT
SUT VIC AB 0 CT1 27 (SUTURE) ×2
SUT VIC AB 0 CT1 27XBRD ANTBC (SUTURE) ×1 IMPLANT
SUT VIC AB 1 CT1 27 (SUTURE)
SUT VIC AB 1 CT1 27XBRD ANTBC (SUTURE) IMPLANT
SUT VIC AB 1 CTX 36 (SUTURE)
SUT VIC AB 1 CTX36XBRD ANBCTR (SUTURE) IMPLANT
SUT VIC AB 2-0 CT1 36 (SUTURE) ×6 IMPLANT
SYR 30ML LL (SYRINGE) IMPLANT
TAPE CLOTH SURG 6X10 WHT LF (GAUZE/BANDAGES/DRESSINGS) ×1 IMPLANT
TOWEL OR 17X26 10 PK STRL BLUE (TOWEL DISPOSABLE) ×4 IMPLANT
TOWER CARTRIDGE SMART MIX (DISPOSABLE) IMPLANT
TRAY FOLEY W/METER SILVER 16FR (SET/KITS/TRAYS/PACK) ×2 IMPLANT
WATER STERILE IRR 1500ML POUR (IV SOLUTION) ×2 IMPLANT

## 2016-12-06 NOTE — Progress Notes (Signed)
Pt currently confused & showing signs of heightened fall/safety risk. telesitter set up. Gabrielle [name], [employer] Bath [employer] Beyondaylor

## 2016-12-06 NOTE — Interval H&P Note (Signed)
History and Physical Interval Note:  12/06/2016 7:24 AM  Gabrielle CanaryBarbara A Yurkovich  has presented today for surgery, with the diagnosis of right femoral neck fracture  The various methods of treatment have been discussed with the patient and family. After consideration of risks, benefits and other options for treatment, the patient has consented to  Procedure(s): ARTHROPLASTY BIPOLAR HIP (HEMIARTHROPLASTY) (Right) as a surgical intervention .  The patient's history has been reviewed, patient examined, no change in status, stable for surgery.  I have reviewed the patient's chart and labs.  Questions were answered to the patient's satisfaction.     Eldred MangesMark C Velda Wendt

## 2016-12-06 NOTE — Consult Note (Signed)
Reason for Consult:right displaced femoral neck fracture Referring Physician: Helyn Numbers  Gabrielle [name] is an 83 y.o. female.  HPI: 83 year old female with multiple medical problems including dementia hyperlipidemia anxiety aortic insufficiency previous left femoral neck fracture treated with hemiarthroplasty 2017, recent problems with flu despite having gotten the flu vaccine. Patient started Tamiflu. Patient had an independent living facility her son is with her today and she fell suffering a right femoral neck fracture which is displaced. Unfortunately due to the flu this makes general anesthesia a poor choice with likely pneumonia. Patient has pain with any motion of the right hip she rates the pain is severe she gets relief if she does not move. Onset was when she fell with hip pain. Risk factors for the fall include recent flu balance problems, dementia, previous hemiarthroplasty on the opposite side. . Past Medical History:  Diagnosis Date  . Arthritis    oa  . Glaucoma 08/04/2015  . Hyperlipidemia   . Mild aortic insufficiency: Per 2 d echo 08/04/2015 08/04/2015  . Moderate mitral regurgitation: Per 2 d echo 08/04/2015 08/04/2015    Past Surgical History:  Procedure Laterality Date  . ABDOMINAL HYSTERECTOMY    . benign breast tumor removed  age 71  . HIP ARTHROPLASTY Left 08/31/2016   Procedure: ARTHROPLASTY  HIP (HEMIARTHROPLASTY);  Surgeon: Marybelle Killings, MD;  Location: [facility];  Service: Orthopedics;  Laterality: Left;  . TONSILLECTOMY  age 70  . TOTAL KNEE ARTHROPLASTY Right 11/21/2013   Procedure: RIGHT TOTAL KNEE ARTHROPLASTY;  Surgeon: Gearlean Alf, MD;  Location: [facility];  Service: Orthopedics;  Laterality: Right;  . WRIST FRACTURE SURGERY Right     History reviewed. No pertinent family history.  Social History:  reports that she quit smoking about 51 years ago. Her smoking use included Cigarettes. She has a 17.00 pack-year smoking history. She has never used  smokeless tobacco. She reports that she drinks alcohol. She reports that she does not use drugs.  Allergies: No Known Allergies  Medications: I have reviewed the patient's current medications.  Results for orders placed or performed during the hospital encounter of 12/05/16 (from the past 48 hour(s))  Type and screen     Status: None   Collection Time: 12/05/16  6:18 PM  Result Value Ref Range   ABO/RH(D) A POS    Antibody Screen NEG    Sample Expiration 12/08/2016   CBC with Differential     Status: None   Collection Time: 12/05/16  6:22 PM  Result Value Ref Range   WBC 5.8 4.0 - 10.5 K/uL   RBC 4.38 3.87 - 5.11 MIL/uL   Hemoglobin 12.8 12.0 - 15.0 g/dL   HCT 38.9 36.0 - 46.0 %   MCV 88.8 78.0 - 100.0 fL   MCH 29.2 26.0 - 34.0 pg   MCHC 32.9 30.0 - 36.0 g/dL   RDW 14.0 11.5 - 15.5 %   Platelets 176 150 - 400 K/uL   Neutrophils Relative % 69 %   Neutro Abs 4.0 1.7 - 7.7 K/uL   Lymphocytes Relative 15 %   Lymphs Abs 0.8 0.7 - 4.0 K/uL   Monocytes Relative 16 %   Monocytes Absolute 0.9 0.1 - 1.0 K/uL   Eosinophils Relative 0 %   Eosinophils Absolute 0.0 0.0 - 0.7 K/uL   Basophils Relative 0 %   Basophils Absolute 0.0 0.0 - 0.1 K/uL  Comprehensive metabolic panel     Status: Abnormal   Collection Time:  12/05/16  6:22 PM  Result Value Ref Range   Sodium 132 (L) 135 - 145 mmol/L   Potassium 4.9 3.5 - 5.1 mmol/L   Chloride 96 (L) 101 - 111 mmol/L   CO2 28 22 - 32 mmol/L   Glucose, Bld 95 65 - 99 mg/dL   BUN 14 6 - 20 mg/dL   Creatinine, Ser 0.89 0.44 - 1.00 mg/dL   Calcium 8.3 (L) 8.9 - 10.3 mg/dL   Total Protein 6.9 6.5 - 8.1 g/dL   Albumin 3.8 3.5 - 5.0 g/dL   AST 82 (H) 15 - 41 U/L   ALT 42 14 - 54 U/L   Alkaline Phosphatase 46 38 - 126 U/L   Total Bilirubin 1.2 0.3 - 1.2 mg/dL   GFR calc non Af Amer 57 (L) >60 mL/min   GFR calc Af Amer >60 >60 mL/min    Comment: (NOTE) The eGFR has been calculated using the CKD EPI equation. This calculation has not been  validated in all clinical situations. eGFR's persistently <60 mL/min signify possible Chronic Kidney Disease.    Anion gap 8 5 - 15  Protime-INR     Status: None   Collection Time: 12/05/16  6:22 PM  Result Value Ref Range   Prothrombin Time 14.2 11.4 - 15.2 seconds   INR 1.10   I-stat troponin, ED     Status: None   Collection Time: 12/05/16  6:30 PM  Result Value Ref Range   Troponin i, poc 0.02 0.00 - 0.08 ng/mL   Comment 3            Comment: Due to the release kinetics of cTnI, a negative result within the first hours of the onset of symptoms does not rule out myocardial infarction with certainty. If myocardial infarction is still suspected, repeat the test at appropriate intervals.   APTT     Status: None   Collection Time: 12/05/16  8:09 PM  Result Value Ref Range   aPTT 33 24 - 36 seconds  Influenza panel by PCR (type A & B)     Status: Abnormal   Collection Time: 12/05/16  8:16 PM  Result Value Ref Range   Influenza A By PCR POSITIVE (A) NEGATIVE   Influenza B By PCR NEGATIVE NEGATIVE    Comment: (NOTE) The Xpert Xpress Flu assay is intended as an aid in the diagnosis of  influenza and should not be used as a sole basis for treatment.  This  assay is FDA approved for nasopharyngeal swab specimens only. Nasal  washings and aspirates are unacceptable for Xpert Xpress Flu testing.   MRSA PCR Screening     Status: None   Collection Time: 12/06/16  3:34 AM  Result Value Ref Range   MRSA by PCR NEGATIVE NEGATIVE    Comment:        The GeneXpert MRSA Assay (FDA approved for NASAL specimens only), is one component of a comprehensive MRSA colonization surveillance program. It is not intended to diagnose MRSA infection nor to guide or monitor treatment for MRSA infections.   Urinalysis, Routine w reflex microscopic     Status: Abnormal   Collection Time: 12/06/16  3:56 AM  Result Value Ref Range   Color, Urine YELLOW YELLOW   APPearance CLEAR CLEAR   Specific  Gravity, Urine 1.013 1.005 - 1.030   pH 5.0 5.0 - 8.0   Glucose, UA NEGATIVE NEGATIVE mg/dL   Hgb urine dipstick SMALL (A) NEGATIVE   Bilirubin Urine  NEGATIVE NEGATIVE   Ketones, ur NEGATIVE NEGATIVE mg/dL   Protein, ur NEGATIVE NEGATIVE mg/dL   Nitrite NEGATIVE NEGATIVE   Leukocytes, UA TRACE (A) NEGATIVE   RBC / HPF 0-5 0 - 5 RBC/hpf   WBC, UA 6-30 0 - 5 WBC/hpf   Bacteria, UA NONE SEEN NONE SEEN   Squamous Epithelial / LPF NONE SEEN NONE SEEN   Mucous PRESENT   CBC     Status: Abnormal   Collection Time: 12/06/16  4:35 AM  Result Value Ref Range   WBC 6.0 4.0 - 10.5 K/uL   RBC 3.98 3.87 - 5.11 MIL/uL   Hemoglobin 11.9 (L) 12.0 - 15.0 g/dL   HCT 36.3 36.0 - 46.0 %   MCV 91.2 78.0 - 100.0 fL   MCH 29.9 26.0 - 34.0 pg   MCHC 32.8 30.0 - 36.0 g/dL   RDW 14.3 11.5 - 15.5 %   Platelets 143 (L) 150 - 400 K/uL  Basic metabolic panel     Status: Abnormal   Collection Time: 12/06/16  4:35 AM  Result Value Ref Range   Sodium 134 (L) 135 - 145 mmol/L   Potassium 3.8 3.5 - 5.1 mmol/L    Comment: DELTA CHECK NOTED   Chloride 98 (L) 101 - 111 mmol/L   CO2 31 22 - 32 mmol/L   Glucose, Bld 88 65 - 99 mg/dL   BUN 13 6 - 20 mg/dL   Creatinine, Ser 0.74 0.44 - 1.00 mg/dL   Calcium 8.4 (L) 8.9 - 10.3 mg/dL   GFR calc non Af Amer >60 >60 mL/min   GFR calc Af Amer >60 >60 mL/min    Comment: (NOTE) The eGFR has been calculated using the CKD EPI equation. This calculation has not been validated in all clinical situations. eGFR's persistently <60 mL/min signify possible Chronic Kidney Disease.    Anion gap 5 5 - 15    Dg Chest 1 View  Result Date: 12/05/2016 CLINICAL DATA:  Preoperative chest radiograph, for hip fracture. Cough. Initial encounter. EXAM: CHEST 1 VIEW COMPARISON:  Chest radiograph performed 08/30/2016 FINDINGS: The lungs are well-aerated and clear. There is no evidence of focal opacification, pleural effusion or pneumothorax. The cardiomediastinal silhouette is borderline  normal in size. No acute osseous abnormalities are seen. Right convex thoracic scoliosis is noted. IMPRESSION: No acute cardiopulmonary process seen. Right convex thoracic scoliosis noted. No displaced rib fractures identified. Electronically Signed   By: Garald Balding M.D.   On: 12/05/2016 19:13   Ct Head Wo Contrast  Result Date: 12/05/2016 CLINICAL DATA:  Dizziness with subsequent fall. EXAM: CT HEAD WITHOUT CONTRAST CT CERVICAL SPINE WITHOUT CONTRAST TECHNIQUE: Multidetector CT imaging of the head and cervical spine was performed following the standard protocol without intravenous contrast. Multiplanar CT image reconstructions of the cervical spine were also generated. COMPARISON:  08/03/2015 FINDINGS: CT HEAD FINDINGS Brain: Ventricles and cisterns are within normal. There is mild age related atrophic change. There is no mass, mass effect, shift of midline structures or acute hemorrhage. No evidence of acute infarction. Minimal chronic ischemic microvascular disease. Vascular: Mild plaque over the cavernous segment of the internal carotid arteries. Skull: Within normal. Sinuses/Orbits: Orbits are within normal. Paranasal sinuses are well developed and well aerated without air-fluid levels or significant opacification. Other: None. CT CERVICAL SPINE FINDINGS Alignment:  Within normal. Skull base and vertebrae: Vertebral body alignment and heights are within normal. There is mild spondylosis throughout the cervical spine. There is moderate uncovertebral joint spurring and  mild facet arthropathy. No acute fracture. Soft tissues and spinal canal: Prevertebral soft tissues are within normal. Spinal canal is within normal. Disc levels: Mild disc space narrowing at the C3-4, C4-5, C5-6 and C6-7 levels. Upper chest: Within normal. Other: None. IMPRESSION: No acute intracranial findings. Minimal chronic ischemic microvascular disease. No acute cervical spine injury. Mild spondylosis throughout the cervical spine  with mild multilevel disc disease. Electronically Signed   By: Marin Olp M.D.   On: 12/05/2016 19:02   Ct Cervical Spine Wo Contrast  Result Date: 12/05/2016 CLINICAL DATA:  Dizziness with subsequent fall. EXAM: CT HEAD WITHOUT CONTRAST CT CERVICAL SPINE WITHOUT CONTRAST TECHNIQUE: Multidetector CT imaging of the head and cervical spine was performed following the standard protocol without intravenous contrast. Multiplanar CT image reconstructions of the cervical spine were also generated. COMPARISON:  08/03/2015 FINDINGS: CT HEAD FINDINGS Brain: Ventricles and cisterns are within normal. There is mild age related atrophic change. There is no mass, mass effect, shift of midline structures or acute hemorrhage. No evidence of acute infarction. Minimal chronic ischemic microvascular disease. Vascular: Mild plaque over the cavernous segment of the internal carotid arteries. Skull: Within normal. Sinuses/Orbits: Orbits are within normal. Paranasal sinuses are well developed and well aerated without air-fluid levels or significant opacification. Other: None. CT CERVICAL SPINE FINDINGS Alignment:  Within normal. Skull base and vertebrae: Vertebral body alignment and heights are within normal. There is mild spondylosis throughout the cervical spine. There is moderate uncovertebral joint spurring and mild facet arthropathy. No acute fracture. Soft tissues and spinal canal: Prevertebral soft tissues are within normal. Spinal canal is within normal. Disc levels: Mild disc space narrowing at the C3-4, C4-5, C5-6 and C6-7 levels. Upper chest: Within normal. Other: None. IMPRESSION: No acute intracranial findings. Minimal chronic ischemic microvascular disease. No acute cervical spine injury. Mild spondylosis throughout the cervical spine with mild multilevel disc disease. Electronically Signed   By: Marin Olp M.D.   On: 12/05/2016 19:02   Dg Hip Unilat  With Pelvis 2-3 Views Right  Result Date:  12/05/2016 CLINICAL DATA:  Acute onset of syncope and fall. Right hip pain. Initial encounter. EXAM: DG HIP (WITH OR WITHOUT PELVIS) 2-3V RIGHT COMPARISON:  None. FINDINGS: There is a mildly displaced subcapital fracture through the right femoral neck, with superior displacement of the distal femur. The right femoral head remains seated at the acetabulum. The left hip arthroplasty is grossly unremarkable. Mild degenerative change is noted at the lower lumbar spine. The sacroiliac joints are unremarkable in appearance. The visualized bowel gas pattern is grossly unremarkable in appearance. IMPRESSION: Mildly displaced subcapital fracture through the right femoral neck, with superior displacement of the distal femur. Electronically Signed   By: Garald Balding M.D.   On: 12/05/2016 17:58    Review of Systems  Constitutional:       Patient has the flu she's been coughing.  Eyes:       Patient wears glasses  Respiratory:       The patient currently has the flu has been coughing. Coughing and worse last 24 hours since she's been in the bed with hip fracture.  Cardiovascular:       Aortic insufficiency  Psychiatric/Behavioral:        positive for dementia   GU-previous abdominal hysterectomy.  Musculoskeletal previous left hip hemiarthroplasty October 2017. Total knee arthroplasty 2015. Right wrist fracture in the past. Blood pressure (!) 125/56, pulse (!) 51, temperature 98.5 F (36.9 C), temperature source Oral, resp. rate  18, height 5' 9" (1.753 m), weight 158 lb (71.7 kg), SpO2 98 %. Physical Exam  Constitutional: She is oriented to person, place, and time. She appears well-developed.  HENT:  Head: Normocephalic.  Eyes: Pupils are equal, round, and reactive to light.  Neck: Normal range of motion.  Cardiovascular: Normal rate.   Respiratory: She has no wheezes.  Patient coughing no acute respiratory distress.  GI: Soft. She exhibits no distension.  Musculoskeletal:  Pain with right hip  range of motion hip shortened and actually rotated. Distal pulses are intact.  Neurological: She is alert and oriented to person, place, and time.  Skin: Skin is warm.  Psychiatric:  Patient's oriented 3. Sons at the bedside.    Assessment/Plan: A discussion was held with Dr. Glennon Mac the anesthesiologist the patient and also the patient's son at the bedside. She is a significantly increased risk for problems respiratory wise if general anesthesia is used. We'll attempt a spinal this is not successful then we will capsule surgery and wait a few days until her fluids improved. The increased mortality from having a broken hip being in bed and not having early fixation immobilization. Unfortunately having a broken hip with the flu puts her at increased mortality. If the spinal successful then we'll proceed with hemiarthroplasty so patient can be mobilized out of bed to chair. Patient has multiple medical problems risks of the stroke, heart attack, respiratory failure, pneumonia, and death, potential risks.  Marybelle Killings 12/06/2016, 7:37 AM

## 2016-12-06 NOTE — Op Note (Signed)
Preop diagnosis: Right femoral neck fracture  Postop diagnosis same  Procedure: Right hip hemiarthroplasty for femoral neck fracture.  Surgeon: Annell GreeningMark Joyceann Kruser M.D.  Anesthesia spinal plus Marcaine local  Procedure: After discussion with anesthesia for wrist with the patient having flu plus extensive multiple medical problems it was decided to consultation to proceed with the spinal anesthesia and this was unsuccessful we would have to delay surgery for several days. Fortunately the spinal successful and standard prepping draping was performed with patient in lateral position. Usual extremity sheets drapes impervious stockinette sterile skin marker Betadine Steri-Drape was applied.  Posterior approach was made gluteus maximus was split Charlie retractor placed. Piriformis tagged cut capsule opened head removed with a corkscrew. Sizing showed that this was a 15 within the opposite hip was a 49. Broaching progressed up to a #4 after cutting the neck. Neck cut was slightly long prosthesis was backed out neck was recut removing tumor millimeters and then impacted. Collar was flush with the calcar.  Reduction good stability. Internal rotation to 90 with hip flexed 90 showed good stability. Acetabulum was normal piriformis was repaired. Iliotibial band was repaired with #1 Ethibond. 0 Ethibond and the gluteus maximus fascia and 2-0 Vicryl subtendinous tissue skin staple closure Cintia Gleed infiltration skin.

## 2016-12-06 NOTE — Progress Notes (Signed)
PHARMACY NOTE:  ANTIMICROBIAL RENAL DOSAGE ADJUSTMENT  Current antimicrobial regimen includes a mismatch between antimicrobial dosage and estimated renal function.  As per policy approved by the Pharmacy & Therapeutics and Medical Executive Committees, the antimicrobial dosage will be adjusted accordingly.  Current antimicrobial dosage:  Tamiflu 75mg  BID x 5 days  Indication: Influenza  Renal Function:  Estimated Creatinine Clearance: 46.5 mL/min (by C-G formula based on SCr of 0.89 mg/dL). []      On intermittent HD, scheduled: []      On CRRT    Antimicrobial dosage has been changed to: Tamiflu 30mg  PO BID x 5 days    Thank you for allowing pharmacy to be a part of this patient's care.  Maryellen Pileoindexter, Khaleed Holan Trefz, [facility]ien[facility] Medical CenterRPH 12/06/2016 3:42 AM

## 2016-12-06 NOTE — Anesthesia Postprocedure Evaluation (Signed)
Anesthesia Post Note  Patient: Gabrielle [name]  Procedure(s) Performed: Procedure(s) (LRB): ARTHROPLASTY BIPOLAR HIP (HEMIARTHROPLASTY) (Right)  Patient location during evaluation: PACU Anesthesia Type: Spinal Level of consciousness: awake and alert, patient cooperative and oriented Pain management: pain level controlled Vital Signs Assessment: post-procedure vital signs reviewed and stable Respiratory status: spontaneous breathing, nonlabored ventilation, respiratory function stable and patient connected to nasal cannula oxygen Cardiovascular status: blood pressure returned to baseline and stable Postop Assessment: patient able to bend at knees and spinal receding Anesthetic complications: no       Last Vitals:  Vitals:   12/06/16 1242 12/06/16 1357  BP: (!) 124/42 (!) 105/47  Pulse: (!) 56 (!) 54  Resp: 15 16  Temp: 36.7 C 36.9 C    Last Pain:  Vitals:   12/06/16 1357  TempSrc: Oral  PainSc:                  Levada Bowersox,E. Keithen Capo

## 2016-12-06 NOTE — Transfer of Care (Signed)
Immediate Anesthesia Transfer of Care Note  Patient: Gabrielle [name]  Procedure(s) Performed: Procedure(s): ARTHROPLASTY BIPOLAR HIP (HEMIARTHROPLASTY) (Right)  Patient Location: PACU  Anesthesia Type:MAC and Spinal  Level of Consciousness:  sedated, patient cooperative and responds to stimulation  Airway & Oxygen Therapy:Patient Spontanous Breathing and Patient connected to face mask oxgen  Post-op Assessment:  Report given to PACU RN and Post -op Vital signs reviewed and stable  Post vital signs:  Reviewed and stable  Last Vitals:  Vitals:   12/06/16 0134 12/06/16 0600  BP: (!) 103/47 (!) 125/56  Pulse: (!) 51 (!) 51  Resp: 18 18  Temp: 37.1 C 16.8 C    Complications: No apparent anesthesia complications

## 2016-12-06 NOTE — Anesthesia Preprocedure Evaluation (Addendum)
Anesthesia Evaluation  Patient identified by MRN, date of birth, ID band Patient awake    Reviewed: Allergy & Precautions, NPO status , Patient's Chart, lab work & pertinent test results  Airway Mallampati: II  TM Distance: >3 FB Neck ROM: Full    Dental  (+) Dental Advisory Given   Pulmonary Recent URI  (current flu positive), former smoker,    + rhonchi        Cardiovascular (-) angina+ Valvular Problems/Murmurs MR and AI  Rhythm:Regular Rate:Normal  '16 ECHO: EF 55-60%, mild AI, mod MR   Neuro/Psych PSYCHIATRIC DISORDERS (mild dementia: namenda) Depression    GI/Hepatic negative GI ROS, Neg liver ROS,   Endo/Other  negative endocrine ROS  Renal/GU negative Renal ROS     Musculoskeletal  (+) Arthritis , Osteoarthritis,    Abdominal   Peds  Hematology negative hematology ROS (+)   Anesthesia Other Findings   Reproductive/Obstetrics                            Anesthesia Physical Anesthesia Plan  ASA: IV  Anesthesia Plan: Spinal   Post-op Pain Management:    Induction:   Airway Management Planned: Natural Airway and Simple Face Mask  Additional Equipment:   Intra-op Plan:   Post-operative Plan:   Informed Consent: I have reviewed the patients History and Physical, chart, labs and discussed the procedure including the risks, benefits and alternatives for the proposed anesthesia with the patient or authorized representative who has indicated his/her understanding and acceptance.   Dental advisory given and Consent reviewed with POA  Plan Discussed with: CRNA and Surgeon  Anesthesia Plan Comments: (Plan routine monitors, SAB Discussed with pt, son, Dr Ophelia CharterYates.  Given pt's worsening cough with flu, plan SAB. If SAB proves difficult or inadequate, postpone pending improvement of cough with Tamiflu. Pt and son accept suspension DNR perioperatively)        Anesthesia Quick  Evaluation

## 2016-12-06 NOTE — Anesthesia Procedure Notes (Signed)
Spinal  Patient location during procedure: OR End time: 12/06/2016 8:09 AM Staffing Anesthesiologist: Jairo BenJACKSON, [name] Performed: anesthesiologist  Preanesthetic Checklist Completed: patient identified, site marked, surgical consent, pre-op evaluation, timeout performed, IV checked, risks and benefits discussed and monitors and equipment checked Spinal Block Patient position: sitting Prep: site prepped and draped and DuraPrep Patient monitoring: heart rate, cardiac monitor, continuous pulse ox and blood pressure Approach: midline Location: L3-4 Injection technique: single-shot Needle Needle type: Quincke  Needle gauge: 25 G Needle length: 9 cm Additional Notes Pt identified in Operating room.  Monitors applied. Working IV access confirmed. Sterile prep, drape lumbar spine.  1% lido local L 3,4.  #25ga Quincke into clear CSF L 3,4.  13.5 mg 0.75% Bupivacaine with dextrose injected with asp CSF beginning and end of injection.  Patient asymptomatic, VSS, no heme aspirated, tolerated well.  Gabrielle Craze Dinero Chavira, MD

## 2016-12-06 NOTE — Progress Notes (Signed)
Patient arrived on the unit at approximately 2203 from the ED accompanied by family members. She is alert and verbally responsive but complains of severe pain with movement. Right leg is shorter than left leg and it is externally rotated. Will be placed on NPO after midnight.

## 2016-12-06 NOTE — Progress Notes (Signed)
PT Cancellation Note  Patient Details Name: Gabrielle LeanBarbara A [name] MRN: [id number] DOB: [birth year]   Cancelled Treatment:     PT order received but eval deferred at this time and PT services dc 2* pt with hip fx and for surgery this date.  Please re-order post op.   Salif Tay 12/06/2016, 7:06 AM

## 2016-12-06 NOTE — Progress Notes (Signed)
Received order for foley cath and same was placed at approximately 2325. Patient tolerated procedure.

## 2016-12-06 NOTE — Brief Op Note (Signed)
12/05/2016 - 12/06/2016  9:22 AM  PATIENT:  Gabrielle [name]  83 y.o. female  PRE-OPERATIVE DIAGNOSIS:  right femoral neck fracture  POST-OPERATIVE DIAGNOSIS:  right femoral neck fracture  PROCEDURE:  Procedure(s): ARTHROPLASTY BIPOLAR HIP (HEMIARTHROPLASTY) (Right)  SURGEON:  Surgeon(s) and Role:    * Eldred MangesMark C Yates, MD - Primary  PHYSICIAN ASSISTANT:   ASSISTANTS: none   ANESTHESIA:   spinal and local  EBL:  Total I/O In: 1000 [I.V.:1000] Out: 150 [Urine:100; Blood:50]  BLOOD ADMINISTERED:none  DRAINS: none   LOCAL MEDICATIONS USED:  MARCAINE     SPECIMEN:  No Specimen  DISPOSITION OF SPECIMEN:  N/A  COUNTS:  YES  TOURNIQUET:  * No tourniquets in log *  DICTATION: .Dragon Dictation  PLAN OF CARE: already IP  PATIENT DISPOSITION:  PACU - hemodynamically stable.   Delay start of Pharmacological VTE agent (>24hrs) due to surgical blood loss or risk of bleeding: yes

## 2016-12-06 NOTE — Progress Notes (Signed)
PHARMACY BRIEF NOTE: CITALOPRAM DOSAGE  On citalopram 40 mg daily as taken PTA.   Current FDA labeling recommends limiting citalopram dosage to 20mg  daily when age > 60 due to increased risk of QTc prolongation and life-threatening arrhythmias.   QTc = 451 msec on EKG 12/05/16  Recommend: Consider risk vs benefit of continuing present citalopram dosage.  Elie Goodyandy Kealie Barrie, PharmD, BCPS Pager: [phone] 12/06/2016  6:11 AM

## 2016-12-06 NOTE — Progress Notes (Signed)
[facility] PROGRESS NOTE  Gabrielle LeanBarbara A [name] ZOX:[id number] DOB: [birth year] DOA: 12/05/2016 PCP: [name],[name], MD  Interim summary and HPI 83 y.o. female with medical history significant of dementia, hyperlipidemia, anxiety, mitral valve prolapse, aortic valve insufficiency, glaucoma, arthritis, using walker, who presents with fall and right hip injury.  Per pt's son, pt has been mildly sick in the past several days. She has mild dry cough and mild running nose. She also has increased urinary frequency, but no dysuria or burning on urination. No chest pain or shortness of breath. She has nausea, but no vomiting, abdominal pain or diarrhea.  Pt states that she got dizzy in this afternoon, fell when she tried to reach some thing from, injured her right hip, caused severe pain. She did not have unilateral weakness, numbness in extremities, no vision change or hearing loss. Per her son, patient did not have LOC. She denies any numbness of her right leg. Please right hip pain is constant, sharp, 8 out of 10 in severity, nonradiating. Patient underwent left hip arthroplasty in 2017 with Dr. Ophelia CharterYates.  Assessment/Plan: Closed right hip fracture, initial encounter [facility](HCC): As evidenced by x-ray. Patient has no signs for neurovascular compromise.  -status post right hemiarthroplasty by Dr. Ophelia CharterYates -will follow post operative rec's -continue PRN analgesics  -follow PT/OT assessment and rec's -follow Hgb and electrolytes  Fall and dizziness: Likely due to multifactorial etiology, including upper respiratory viral infection (Flu), deconditioning, ongoing dementia and dehydration. No focal neurological findings, less likely to have stroke. CT of the head and C-spine negative for acute abnormalities. -will continue IVF's -treatment for Flu as mentioned below  -supportive care -will follow rec's from PT/OT  HLD:  -Continue lipitor  Influenza A with respiratory symptoms -will continue tamiflu  and supportive care -cough is dry, will use Delsym only  Dementia: -patient oriented X3 currently and answering questions properly -Continue Namenda, donepezil -at risk for hospital acquired delirium   Depression: Stable, no suicidal or homicidal ideations. -Continue Celexa  Code Status: DNR Family Communication: son at bedside  Disposition Plan: to be determined; but will anticipate needs for SNF for rehabilitation. Will follow post operative rec's from orthopedic service, assess needs and capability with PT/OT, continue treatment for FLu.   Consultants:  Orthopedic service   Procedures: Right hip hemiarthroplasty for femoral neck fracture.  Antibiotics:  Tamiflu 1/26  HPI/Subjective: Complaining of coughing spells and mild SOB. No fever. Status post right hip surgery (with spinal block). Reporting pain is mild at this time and no complaining of CP or SOB.  Objective: Vitals:   12/06/16 1242 12/06/16 1357  BP: (!) 124/42 (!) 105/47  Pulse: (!) 56 (!) 54  Resp: 15 16  Temp: 98 F (36.7 C) 98.4 F (36.9 C)    Intake/Output Summary (Last 24 hours) at 12/06/16 1730 Last data filed at 12/06/16 1700  Gross per 24 hour  Intake          2591.63 ml  Output              850 ml  Net          1741.63 ml   Filed Weights   12/05/16 1712  Weight: 71.7 kg (158 lb)    Exam:   General:  Afebrile, able to follow commands properly and reporting pain to be mild at this time. Patient is status post Right hip hemiarthroplasty for femoral neck fracture. With intermittent coughing spells  Cardiovascular: S1 and s2, no rubs, no gallops  Respiratory: scattered  rhonchi, no crackles. Patient not requiring accessory muscles   Abdomen: soft, NT, ND, positive BS  Musculoskeletal: no edema, no cyanosis. Right hip with clean and intact dressings   Data Reviewed: Basic Metabolic Panel:  Recent Labs Lab 12/05/16 1822 12/06/16 0435  NA 132* 134*  K 4.9 3.8  CL 96* 98*  CO2 28  31  GLUCOSE 95 88  BUN 14 13  CREATININE 0.89 0.74  CALCIUM 8.3* 8.4*   Liver Function Tests:  Recent Labs Lab 12/05/16 1822  AST 82*  ALT 42  ALKPHOS 46  BILITOT 1.2  PROT 6.9  ALBUMIN 3.8   CBC:  Recent Labs Lab 12/05/16 1822 12/06/16 0435  WBC 5.8 6.0  NEUTROABS 4.0  --   HGB 12.8 11.9*  HCT 38.9 36.3  MCV 88.8 91.2  PLT 176 143*   CBG: No results for input(s): GLUCAP in the last 168 hours.  Recent Results (from the past 240 hour(s))  MRSA PCR Screening     Status: None   Collection Time: 12/06/16  3:34 AM  Result Value Ref Range Status   MRSA by PCR NEGATIVE NEGATIVE Final    Comment:        The GeneXpert MRSA Assay (FDA approved for NASAL specimens only), is one component of a comprehensive MRSA colonization surveillance program. It is not intended to diagnose MRSA infection nor to guide or monitor treatment for MRSA infections.      Studies: Dg Chest 1 View  Result Date: 12/05/2016 CLINICAL DATA:  Preoperative chest radiograph, for hip fracture. Cough. Initial encounter. EXAM: CHEST 1 VIEW COMPARISON:  Chest radiograph performed 08/30/2016 FINDINGS: The lungs are well-aerated and clear. There is no evidence of focal opacification, pleural effusion or pneumothorax. The cardiomediastinal silhouette is borderline normal in size. No acute osseous abnormalities are seen. Right convex thoracic scoliosis is noted. IMPRESSION: No acute cardiopulmonary process seen. Right convex thoracic scoliosis noted. No displaced rib fractures identified. Electronically Signed   By: Roanna Raider M.D.   On: 12/05/2016 19:13   Ct Head Wo Contrast  Result Date: 12/05/2016 CLINICAL DATA:  Dizziness with subsequent fall. EXAM: CT HEAD WITHOUT CONTRAST CT CERVICAL SPINE WITHOUT CONTRAST TECHNIQUE: Multidetector CT imaging of the head and cervical spine was performed following the standard protocol without intravenous contrast. Multiplanar CT image reconstructions of the  cervical spine were also generated. COMPARISON:  08/03/2015 FINDINGS: CT HEAD FINDINGS Brain: Ventricles and cisterns are within normal. There is mild age related atrophic change. There is no mass, mass effect, shift of midline structures or acute hemorrhage. No evidence of acute infarction. Minimal chronic ischemic microvascular disease. Vascular: Mild plaque over the cavernous segment of the internal carotid arteries. Skull: Within normal. Sinuses/Orbits: Orbits are within normal. Paranasal sinuses are well developed and well aerated without air-fluid levels or significant opacification. Other: None. CT CERVICAL SPINE FINDINGS Alignment:  Within normal. Skull base and vertebrae: Vertebral body alignment and heights are within normal. There is mild spondylosis throughout the cervical spine. There is moderate uncovertebral joint spurring and mild facet arthropathy. No acute fracture. Soft tissues and spinal canal: Prevertebral soft tissues are within normal. Spinal canal is within normal. Disc levels: Mild disc space narrowing at the C3-4, C4-5, C5-6 and C6-7 levels. Upper chest: Within normal. Other: None. IMPRESSION: No acute intracranial findings. Minimal chronic ischemic microvascular disease. No acute cervical spine injury. Mild spondylosis throughout the cervical spine with mild multilevel disc disease. Electronically Signed   By: Elberta Fortis M.D.  On: 12/05/2016 19:02   Ct Cervical Spine Wo Contrast  Result Date: 12/05/2016 CLINICAL DATA:  Dizziness with subsequent fall. EXAM: CT HEAD WITHOUT CONTRAST CT CERVICAL SPINE WITHOUT CONTRAST TECHNIQUE: Multidetector CT imaging of the head and cervical spine was performed following the standard protocol without intravenous contrast. Multiplanar CT image reconstructions of the cervical spine were also generated. COMPARISON:  08/03/2015 FINDINGS: CT HEAD FINDINGS Brain: Ventricles and cisterns are within normal. There is mild age related atrophic change. There  is no mass, mass effect, shift of midline structures or acute hemorrhage. No evidence of acute infarction. Minimal chronic ischemic microvascular disease. Vascular: Mild plaque over the cavernous segment of the internal carotid arteries. Skull: Within normal. Sinuses/Orbits: Orbits are within normal. Paranasal sinuses are well developed and well aerated without air-fluid levels or significant opacification. Other: None. CT CERVICAL SPINE FINDINGS Alignment:  Within normal. Skull base and vertebrae: Vertebral body alignment and heights are within normal. There is mild spondylosis throughout the cervical spine. There is moderate uncovertebral joint spurring and mild facet arthropathy. No acute fracture. Soft tissues and spinal canal: Prevertebral soft tissues are within normal. Spinal canal is within normal. Disc levels: Mild disc space narrowing at the C3-4, C4-5, C5-6 and C6-7 levels. Upper chest: Within normal. Other: None. IMPRESSION: No acute intracranial findings. Minimal chronic ischemic microvascular disease. No acute cervical spine injury. Mild spondylosis throughout the cervical spine with mild multilevel disc disease. Electronically Signed   By: Elberta Fortis M.D.   On: 12/05/2016 19:02   Pelvis Portable  Result Date: 12/06/2016 CLINICAL DATA:  Status post right hip arthroplasty. EXAM: PORTABLE PELVIS 1-2 VIEWS COMPARISON:  08/31/2016 FINDINGS: New right hip hemiarthroplasty appears well-seated and aligned. Left hemiarthroplasty is stable, also well-seated and aligned. No acute fracture or evidence of an operative complication. IMPRESSION: Well-positioned right hip hemiarthroplasty. Electronically Signed   By: Amie Portland M.D.   On: 12/06/2016 09:50   Dg Hip Unilat  With Pelvis 2-3 Views Right  Result Date: 12/05/2016 CLINICAL DATA:  Acute onset of syncope and fall. Right hip pain. Initial encounter. EXAM: DG HIP (WITH OR WITHOUT PELVIS) 2-3V RIGHT COMPARISON:  None. FINDINGS: There is a mildly  displaced subcapital fracture through the right femoral neck, with superior displacement of the distal femur. The right femoral head remains seated at the acetabulum. The left hip arthroplasty is grossly unremarkable. Mild degenerative change is noted at the lower lumbar spine. The sacroiliac joints are unremarkable in appearance. The visualized bowel gas pattern is grossly unremarkable in appearance. IMPRESSION: Mildly displaced subcapital fracture through the right femoral neck, with superior displacement of the distal femur. Electronically Signed   By: Roanna Raider M.D.   On: 12/05/2016 17:58    Scheduled Meds: . atorvastatin  20 mg Oral QHS  . calcium-vitamin D  1 tablet Oral Q breakfast  . cholecalciferol  4,000 Units Oral QHS  . citalopram  40 mg Oral QHS  . donepezil  10 mg Oral QHS  . [START ON 12/07/2016] enoxaparin (LOVENOX) injection  30 mg Subcutaneous Q24H  . memantine  5 mg Oral QHS  . multivitamin  1 tablet Oral QHS  . omega-3 acid ethyl esters  1 g Oral Daily  . oseltamivir  30 mg Oral BID  . timolol  1 drop Both Eyes BID   Continuous Infusions: . sodium chloride 50 mL/hr at 12/06/16 1541    Principal Problem:   Closed right hip fracture, initial encounter [facility]) Active Problems:   Pure hypercholesterolemia  Depression   Dizziness   Dementia   Fall   Cough   Influenza A    Time spent: 30 minutes    Vassie Loll  [facility] Pager [phone]. If 7PM-7AM, please contact night-coverage at [email or web address], password [facility] 12/06/2016, 5:30 PM  LOS: 1 day

## 2016-12-07 DIAGNOSIS — W19XXXD Unspecified fall, subsequent encounter: Secondary | ICD-10-CM

## 2016-12-07 LAB — CBC
HCT: 31.7 % — ABNORMAL LOW (ref 36.0–46.0)
HEMOGLOBIN: 10.5 g/dL — AB (ref 12.0–15.0)
MCH: 30 pg (ref 26.0–34.0)
MCHC: 33.1 g/dL (ref 30.0–36.0)
MCV: 90.6 fL (ref 78.0–100.0)
PLATELETS: 128 10*3/uL — AB (ref 150–400)
RBC: 3.5 MIL/uL — AB (ref 3.87–5.11)
RDW: 14.2 % (ref 11.5–15.5)
WBC: 6.9 10*3/uL (ref 4.0–10.5)

## 2016-12-07 LAB — BASIC METABOLIC PANEL
ANION GAP: 5 (ref 5–15)
BUN: 10 mg/dL (ref 6–20)
CHLORIDE: 98 mmol/L — AB (ref 101–111)
CO2: 31 mmol/L (ref 22–32)
Calcium: 7.8 mg/dL — ABNORMAL LOW (ref 8.9–10.3)
Creatinine, Ser: 0.57 mg/dL (ref 0.44–1.00)
GFR calc Af Amer: >60 mL/min (ref >60)
Glucose, Bld: 87 mg/dL (ref 65–99)
POTASSIUM: 3.6 mmol/L (ref 3.5–5.1)
SODIUM: 134 mmol/L — AB (ref 135–145)

## 2016-12-07 MED ORDER — HYDROCODONE-ACETAMINOPHEN 5-325 MG PO TABS
1.0000 | ORAL_TABLET | Freq: Four times a day (QID) | ORAL | Status: DC | PRN
  Filled 2016-12-07 (×2): qty 1

## 2016-12-07 MED ORDER — CALCIUM CARBONATE-VITAMIN D 500-200 MG-UNIT PO TABS
1.0000 | ORAL_TABLET | Freq: Two times a day (BID) | ORAL | Status: DC
  Administered 2016-12-07 – 2016-12-10 (×6): 1 via ORAL
  Filled 2016-12-07 (×6): qty 1

## 2016-12-07 MED ORDER — ENSURE ENLIVE PO LIQD
237.0000 mL | Freq: Two times a day (BID) | ORAL | Status: DC
  Administered 2016-12-07 – 2016-12-10 (×6): 237 mL via ORAL

## 2016-12-07 MED ORDER — SODIUM CHLORIDE 0.9 % IV SOLN
INTRAVENOUS | Status: DC
  Administered 2016-12-07 – 2016-12-09 (×3): via INTRAVENOUS

## 2016-12-07 MED ORDER — BUDESONIDE 0.25 MG/2ML IN SUSP
0.2500 mg | Freq: Two times a day (BID) | RESPIRATORY_TRACT | Status: DC
  Administered 2016-12-07 – 2016-12-10 (×6): 0.25 mg via RESPIRATORY_TRACT
  Filled 2016-12-07 (×6): qty 2

## 2016-12-07 NOTE — Progress Notes (Signed)
   Subjective: 1 Day Post-Op Procedure(s) (LRB): ARTHROPLASTY BIPOLAR HIP (HEMIARTHROPLASTY) (Right) Patient reports pain as mild and moderate.    Objective: Vital signs in last 24 hours: Temp:  [97.3 F (36.3 C)-99.3 F (37.4 C)] 98.7 F (37.1 C) (01/28 40980632) Pulse Rate:  [44-61] 59 (01/28 0632) Resp:  [15-23] 16 (01/28 11910632) BP: (102-137)/(37-103) 121/52 (01/28 47820632) SpO2:  [94 %-100 %] 95 % (01/28 95620632)  Intake/Output from previous day: 01/27 0701 - 01/28 0700 In: 2197.6 [P.O.:300; I.V.:1897.6] Out: 1025 [Urine:975; Blood:50] Intake/Output this shift: No intake/output data recorded.   Recent Labs  12/05/16 1822 12/06/16 0435 12/07/16 0435  HGB 12.8 11.9* 10.5*    Recent Labs  12/06/16 0435 12/07/16 0435  WBC 6.0 6.9  RBC 3.98 3.50*  HCT 36.3 31.7*  PLT 143* 128*    Recent Labs  12/06/16 0435 12/07/16 0435  NA 134* 134*  K 3.8 3.6  CL 98* 98*  CO2 31 31  BUN 13 10  CREATININE 0.74 0.57  GLUCOSE 88 87  CALCIUM 8.4* 7.8*    Recent Labs  12/05/16 1822  INR 1.10    Neurologically intact Pelvis Portable  Result Date: 12/06/2016 CLINICAL DATA:  Status post right hip arthroplasty. EXAM: PORTABLE PELVIS 1-2 VIEWS COMPARISON:  08/31/2016 FINDINGS: New right hip hemiarthroplasty appears well-seated and aligned. Left hemiarthroplasty is stable, also well-seated and aligned. No acute fracture or evidence of an operative complication. IMPRESSION: Well-positioned right hip hemiarthroplasty. Electronically Signed   By: Amie Portlandavid  Ormond M.D.   On: 12/06/2016 09:50    Assessment/Plan: 1 Day Post-Op Procedure(s) (LRB): ARTHROPLASTY BIPOLAR HIP (HEMIARTHROPLASTY) (Right) Up with therapy.  She is coughing less. On Flu precautions.   Eldred MangesMark C Shatoya Roets 12/07/2016, 9:18 AM

## 2016-12-07 NOTE — Evaluation (Signed)
Occupational Therapy Evaluation Patient Details Name: Gabrielle [name] MRN: [id number] DOB: [birth year] Today's Date: 12/07/2016    History of Present Illness 83 y.o.femalewho presents with fall and right hip injury; xray=R femoral neck fx-->s/p right hip posterior hemiarthroplasty on 12/06/16 per Dr. Ophelia CharterYates;  medical history significant of dementia, hyperlipidemia, anxiety, mitral valve prolapse, aortic valve insufficiency, glaucoma, arthritis, L hip hemi 2017.    Clinical Impression   Pt admitted with fall which resulted in R hemi hip per Dr Ophelia CharterYates. Pt currently with functional limitations due to the deficits listed below (see OT Problem List).  Pt will benefit from skilled OT to increase their safety and independence with ADL and functional mobility for ADL to facilitate discharge to venue listed below.     Follow Up Recommendations  SNF    Equipment Recommendations  None recommended by OT       Precautions / Restrictions Precautions Precautions: Fall;Posterior Hip Required Braces or Orthoses: Knee Immobilizer - Right Restrictions Weight Bearing Restrictions: No RLE Weight Bearing: Weight bearing as tolerated      Mobility Bed Mobility Overal bed mobility: Needs Assistance Bed Mobility: Supine to Sit     Supine to sit: Mod assist     General bed mobility comments: multi-modal cues for technique, self assist and hip precautions  Transfers Overall transfer level: Needs assistance Equipment used: Rolling walker (2 wheeled) Transfers: Sit to/from Stand Sit to Stand: Min assist;+2 physical assistance;+2 safety/equipment         General transfer comment: cues for hand placement, RLE position and overall safety    Balance Overall balance assessment: Needs assistance;History of Falls         Standing balance support: Bilateral upper extremity supported Standing balance-Leahy Scale: Poor Standing balance comment: reliant on UEs                            ADL Overall ADL's : Needs assistance/impaired     Grooming: Sitting;Set up               Lower Body Dressing: +2 for physical assistance;Maximal assistance;Sit to/from stand;Cueing for safety;Cueing for sequencing                                 Pertinent Vitals/Pain Pain Assessment: 0-10 Pain Score: 2  Pain Location: right hip Pain Descriptors / Indicators: Sore Pain Intervention(s): Limited activity within patient's tolerance;Monitored during session;Repositioned     Hand Dominance     Extremity/Trunk Assessment Upper Extremity Assessment Upper Extremity Assessment: Generalized weakness       Communication Communication Communication: No difficulties   Cognition Arousal/Alertness: Awake/alert Behavior During Therapy: WFL for tasks assessed/performed Overall Cognitive Status: History of cognitive impairments - at baseline                 General Comments: appropriate, follows commands   General Comments       Exercises       Shoulder Instructions      Home Living Family/patient expects to be discharged to:: [facility]illed nursing facility ([facility])                                        Prior Functioning/Environment Level of Independence: Independent with assistive device(s)  OT Problem List: Decreased strength;Decreased activity tolerance;Impaired balance (sitting and/or standing);Decreased safety awareness;Decreased knowledge of use of DME or AE;Decreased knowledge of precautions   OT Treatment/Interventions: Self-care/ADL training;DME and/or AE instruction;Patient/family education    OT Goals(Current goals can be found in the care plan section) Acute Rehab OT Goals Patient Stated Goal: get better OT Goal Formulation: With patient Time For Goal Achievement: 12/21/16  OT Frequency: Min 2X/week   Barriers to D/C: Decreased caregiver support          Co-evaluation PT/OT/SLP  Co-Evaluation/Treatment: Yes Reason for Co-Treatment: For patient/therapist safety PT goals addressed during session: Mobility/safety with mobility;Proper use of DME OT goals addressed during session: ADL's and self-care      End of Session Equipment Utilized During Treatment: Engineer, water Communication: Mobility status  Activity Tolerance: Treatment limited secondary to medical complications (Comment) (decreased BP upon standing- had to return to supine) Patient left: in bed;with call bell/phone within reach;with nursing/sitter in room;with family/visitor present   Time: 1020-1047 OT Time Calculation (min): 27 min Charges:  OT General Charges $OT Visit: 1 Procedure OT Evaluation $OT Eval Moderate Complexity: 1 Procedure G-Codes:    Alba Cory 12/11/2016, 12:47 PM

## 2016-12-07 NOTE — Evaluation (Signed)
Physical Therapy Evaluation Patient Details Name: Gabrielle LeanBarbara A [name] MRN: [id number] DOB: Apr 01, 1927 Today's Date: 12/07/2016   History of Present Illness  83 y.o.femalewho presents with fall and right hip injury; xray=R femoral neck fx-->s/p right hip posterior hemiarthroplasty on 12/06/16 per Dr. Ophelia CharterYates;  medical history significant of dementia, hyperlipidemia, anxiety, mitral valve prolapse, aortic valve insufficiency, glaucoma, arthritis, L hip hemi 2017.   Clinical Impression  Pt admitted with above diagnosis. Pt currently with functional limitations due to the deficits listed below (see PT Problem List).  Pt will benefit from skilled PT to increase their independence and safety with mobility to allow discharge to the venue listed below.   Limited today d/t BP dropping/brief LOC, BP 96/42 after pt rapidly returned to supine, HR 52, Sats 85% on RA-->91% with 3L; pt in NAD, did not recall events, RN present ;Pt will need SNF post acute; Will follow in acute setting    Follow Up Recommendations SNF;Supervision/Assistance - 24 hour    Equipment Recommendations  None recommended by PT    Recommendations for Other Services       Precautions / Restrictions Precautions Precautions: Fall;Posterior Hip Required Braces or Orthoses: Knee Immobilizer - Right Restrictions Weight Bearing Restrictions: No RLE Weight Bearing: Weight bearing as tolerated      Mobility  Bed Mobility Overal bed mobility: Needs Assistance Bed Mobility: Supine to Sit     Supine to sit: Mod assist     General bed mobility comments: multi-modal cues for technique, self assist and hip precautions  Transfers Overall transfer level: Needs assistance Equipment used: Rolling walker (2 wheeled) Transfers: Sit to/from Stand Sit to Stand: Min assist;+2 physical assistance;+2 safety/equipment         General transfer comment: cues for hand placement, RLE position and overall safety  Ambulation/Gait             General Gait Details: attempted steps forward/stand pivot; pt stopped conversant, became diaphoretic, PT/OT return pt to supine see comments for VS; RN made aware  Stairs            Wheelchair Mobility    Modified Rankin (Stroke Patients Only)       Balance Overall balance assessment: Needs assistance;History of Falls         Standing balance support: Bilateral upper extremity supported Standing balance-Leahy Scale: Poor Standing balance comment: reliant on UEs                             Pertinent Vitals/Pain Pain Assessment: 0-10 Pain Score: 2  Pain Location: right hip Pain Descriptors / Indicators: Sore Pain Intervention(s): Limited activity within patient's tolerance;Monitored during session;Repositioned    Home Living Family/patient expects to be discharged to:: Skilled nursing facility (prefers [facility])                      Prior Function Level of Independence: Independent with assistive device(s)               Hand Dominance        Extremity/Trunk Assessment   Upper Extremity Assessment Upper Extremity Assessment: Defer to OT evaluation    Lower Extremity Assessment Lower Extremity Assessment: RLE deficits/detail RLE Deficits / Details: ankle grossly WFL, knee/hip flexion to ~ 50*; strength 2+/5 RLE: Unable to fully assess due to pain       Communication   Communication: No difficulties  Cognition Arousal/Alertness: Awake/alert Behavior During Therapy: [facility] tasks  assessed/performed Overall Cognitive Status: History of cognitive impairments - at baseline                 General Comments: appropriate, follows commands    General Comments      Exercises     Assessment/Plan    PT Assessment Patient needs continued PT services  PT Problem List Decreased strength;Decreased activity tolerance;Decreased mobility;Decreased balance;Decreased range of motion;Decreased cognition;Decreased knowledge  of use of DME;Decreased knowledge of precautions          PT Treatment Interventions DME instruction;Gait training;Functional mobility training;Therapeutic activities;Therapeutic exercise;Patient/family education    PT Goals (Current goals can be found in the Care Plan section)  Acute Rehab PT Goals Patient Stated Goal: get better PT Goal Formulation: With patient/family Time For Goal Achievement: 12/14/16    Frequency Min 3X/week   Barriers to discharge        Co-evaluation PT/OT/SLP Co-Evaluation/Treatment: Yes Reason for Co-Treatment: For patient/therapist safety PT goals addressed during session: Mobility/safety with mobility;Proper use of DME OT goals addressed during session: Proper use of Adaptive equipment and DME;ADL's and self-care       End of Session Equipment Utilized During Treatment: Gait belt Activity Tolerance: Treatment limited secondary to medical complications (Comment) (LOC/low BP) Patient left: with call bell/phone within reach;in bed;with family/visitor present;with nursing/sitter in room;with bed alarm set Nurse Communication: Mobility status         Time: 1610-9604 PT Time Calculation (min) (ACUTE ONLY): 21 min   Charges:   PT Evaluation $PT Eval Moderate Complexity: 1 Procedure     PT G Codes:        Gabrielle [name] 01-04-17, 12:27 PM

## 2016-12-07 NOTE — Progress Notes (Signed)
[facility] PROGRESS NOTE  SHAMIEKA GULLO ZOX:[id number] DOB: 1927/10/03 DOA: 12/05/2016 PCP: Gweneth Dimitri, MD  Interim summary and HPI 83 y.o. female with medical history significant of dementia, hyperlipidemia, anxiety, mitral valve prolapse, aortic valve insufficiency, glaucoma, arthritis, using walker, who presents with fall and right hip injury.  Per pt's son, pt has been mildly sick in the past several days. She has mild dry cough and mild running nose. She also has increased urinary frequency, but no dysuria or burning on urination. No chest pain or shortness of breath. She has nausea, but no vomiting, abdominal pain or diarrhea.  Pt states that she got dizzy in this afternoon, fell when she tried to reach some thing from, injured her right hip, caused severe pain. She did not have unilateral weakness, numbness in extremities, no vision change or hearing loss. Per her son, patient did not have LOC. She denies any numbness of her right leg. Please right hip pain is constant, sharp, 8 out of 10 in severity, nonradiating. Patient underwent left hip arthroplasty in 2017 with Dr. Ophelia Charter.  Assessment/Plan: Closed right hip fracture, initial encounter [facility]): As evidenced by x-ray. Patient has no signs for neurovascular compromise.  -status post right hemiarthroplasty by Dr. Ophelia Charter -will follow post operative rec's -continue PRN analgesics (but will be careful with given soft BP and current worsening confusion) -following PT/OT assessment and rec's; SW consulted for nursing home placement  -follow Hgb and electrolytes -lovenox for DVT prophylaxis   Fall and dizziness: Likely due to multifactorial etiology, including upper respiratory viral infection (Flu), deconditioning, ongoing dementia and dehydration. No focal neurological findings, less likely to have stroke. CT of the head and C-spine negative for acute abnormalities. -will continue IVF's -treatment for Flu as mentioned  below  -supportive care -will follow rec's from PT/OT  HLD:  -Continue lipitor  Influenza A with respiratory symptoms -will continue tamiflu and supportive care -cough is dry, will use Delsym only -will start some pulmicort, due to wheezing heard on exam  Dementia: -patient pleasantly confused today, no agitation; answering questions properly -Continue Namenda, donepezil -at risk for hospital acquired delirium  -poor insight demonstrated currently  Depression: Stable, no suicidal or homicidal ideations. -Continue Celexa  Code Status: DNR Family Communication: son at bedside  Disposition Plan: to be determined; will need SNF for rehabilitation. Will follow post operative rec's from orthopedic service, assess needs and capability with PT/OT, continue treatment for FLu.   Consultants:  Orthopedic service   Procedures: Right hip hemiarthroplasty for femoral neck fracture 1/27.  Antibiotics:  Tamiflu 1/26  HPI/Subjective: Complaining of coughing spells and mild SOB. No fever. Status post right hip surgery (with spinal block). Reporting pain is mild at this time and no complaining of CP or SOB.  Objective: Vitals:   12/07/16 1348 12/07/16 1401  BP: (!) 88/37 (!) 90/56  Pulse:    Resp:    Temp:      Intake/Output Summary (Last 24 hours) at 12/07/16 1707 Last data filed at 12/07/16 1508  Gross per 24 hour  Intake            771.8 ml  Output              925 ml  Net           -153.2 ml   Filed Weights   12/05/16 1712  Weight: 71.7 kg (158 lb)    Exam:   General:  Afebrile, able to follow commands properly and reporting pain to be  mild at this time. Patient is status post Right hip hemiarthroplasty for femoral neck fracture on 1/27. Pleasantly confused today. Per nursing staff less cough appreciated. But requiring O2 supplementation   Cardiovascular: S1 and s2, no rubs, no gallops, positive murmur   Respiratory: scattered rhonchi, no crackles; positive  wheezing. Patient not requiring accessory muscles   Abdomen: soft, NT, ND, positive BS  Musculoskeletal: no edema, no cyanosis. Right hip with clean and intact dressings   Data Reviewed: Basic Metabolic Panel:  Recent Labs Lab 12/05/16 1822 12/06/16 0435 12/07/16 0435  NA 132* 134* 134*  K 4.9 3.8 3.6  CL 96* 98* 98*  CO2 28 31 31   GLUCOSE 95 88 87  BUN 14 13 10   CREATININE 0.89 0.74 0.57  CALCIUM 8.3* 8.4* 7.8*   Liver Function Tests:  Recent Labs Lab 12/05/16 1822  AST 82*  ALT 42  ALKPHOS 46  BILITOT 1.2  PROT 6.9  ALBUMIN 3.8   CBC:  Recent Labs Lab 12/05/16 1822 12/06/16 0435 12/07/16 0435  WBC 5.8 6.0 6.9  NEUTROABS 4.0  --   --   HGB 12.8 11.9* 10.5*  HCT 38.9 36.3 31.7*  MCV 88.8 91.2 90.6  PLT 176 143* 128*   CBG: No results for input(s): GLUCAP in the last 168 hours.  Recent Results (from the past 240 hour(s))  MRSA PCR Screening     Status: None   Collection Time: 12/06/16  3:34 AM  Result Value Ref Range Status   MRSA by PCR NEGATIVE NEGATIVE Final    Comment:        The GeneXpert MRSA Assay (FDA approved for NASAL specimens only), is one component of a comprehensive MRSA colonization surveillance program. It is not intended to diagnose MRSA infection nor to guide or monitor treatment for MRSA infections.      Studies: Dg Chest 1 View  Result Date: 12/05/2016 CLINICAL DATA:  Preoperative chest radiograph, for hip fracture. Cough. Initial encounter. EXAM: CHEST 1 VIEW COMPARISON:  Chest radiograph performed 08/30/2016 FINDINGS: The lungs are well-aerated and clear. There is no evidence of focal opacification, pleural effusion or pneumothorax. The cardiomediastinal silhouette is borderline normal in size. No acute osseous abnormalities are seen. Right convex thoracic scoliosis is noted. IMPRESSION: No acute cardiopulmonary process seen. Right convex thoracic scoliosis noted. No displaced rib fractures identified. Electronically Signed    By: Roanna Raider M.D.   On: 12/05/2016 19:13   Ct Head Wo Contrast  Result Date: 12/05/2016 CLINICAL DATA:  Dizziness with subsequent fall. EXAM: CT HEAD WITHOUT CONTRAST CT CERVICAL SPINE WITHOUT CONTRAST TECHNIQUE: Multidetector CT imaging of the head and cervical spine was performed following the standard protocol without intravenous contrast. Multiplanar CT image reconstructions of the cervical spine were also generated. COMPARISON:  08/03/2015 FINDINGS: CT HEAD FINDINGS Brain: Ventricles and cisterns are within normal. There is mild age related atrophic change. There is no mass, mass effect, shift of midline structures or acute hemorrhage. No evidence of acute infarction. Minimal chronic ischemic microvascular disease. Vascular: Mild plaque over the cavernous segment of the internal carotid arteries. Skull: Within normal. Sinuses/Orbits: Orbits are within normal. Paranasal sinuses are well developed and well aerated without air-fluid levels or significant opacification. Other: None. CT CERVICAL SPINE FINDINGS Alignment:  Within normal. Skull base and vertebrae: Vertebral body alignment and heights are within normal. There is mild spondylosis throughout the cervical spine. There is moderate uncovertebral joint spurring and mild facet arthropathy. No acute fracture. Soft tissues and spinal canal:  Prevertebral soft tissues are within normal. Spinal canal is within normal. Disc levels: Mild disc space narrowing at the C3-4, C4-5, C5-6 and C6-7 levels. Upper chest: Within normal. Other: None. IMPRESSION: No acute intracranial findings. Minimal chronic ischemic microvascular disease. No acute cervical spine injury. Mild spondylosis throughout the cervical spine with mild multilevel disc disease. Electronically Signed   By: Elberta Fortisaniel  Boyle M.D.   On: 12/05/2016 19:02   Ct Cervical Spine Wo Contrast  Result Date: 12/05/2016 CLINICAL DATA:  Dizziness with subsequent fall. EXAM: CT HEAD WITHOUT CONTRAST CT  CERVICAL SPINE WITHOUT CONTRAST TECHNIQUE: Multidetector CT imaging of the head and cervical spine was performed following the standard protocol without intravenous contrast. Multiplanar CT image reconstructions of the cervical spine were also generated. COMPARISON:  08/03/2015 FINDINGS: CT HEAD FINDINGS Brain: Ventricles and cisterns are within normal. There is mild age related atrophic change. There is no mass, mass effect, shift of midline structures or acute hemorrhage. No evidence of acute infarction. Minimal chronic ischemic microvascular disease. Vascular: Mild plaque over the cavernous segment of the internal carotid arteries. Skull: Within normal. Sinuses/Orbits: Orbits are within normal. Paranasal sinuses are well developed and well aerated without air-fluid levels or significant opacification. Other: None. CT CERVICAL SPINE FINDINGS Alignment:  Within normal. Skull base and vertebrae: Vertebral body alignment and heights are within normal. There is mild spondylosis throughout the cervical spine. There is moderate uncovertebral joint spurring and mild facet arthropathy. No acute fracture. Soft tissues and spinal canal: Prevertebral soft tissues are within normal. Spinal canal is within normal. Disc levels: Mild disc space narrowing at the C3-4, C4-5, C5-6 and C6-7 levels. Upper chest: Within normal. Other: None. IMPRESSION: No acute intracranial findings. Minimal chronic ischemic microvascular disease. No acute cervical spine injury. Mild spondylosis throughout the cervical spine with mild multilevel disc disease. Electronically Signed   By: Elberta Fortisaniel  Boyle M.D.   On: 12/05/2016 19:02   Pelvis Portable  Result Date: 12/06/2016 CLINICAL DATA:  Status post right hip arthroplasty. EXAM: PORTABLE PELVIS 1-2 VIEWS COMPARISON:  08/31/2016 FINDINGS: New right hip hemiarthroplasty appears well-seated and aligned. Left hemiarthroplasty is stable, also well-seated and aligned. No acute fracture or evidence of an  operative complication. IMPRESSION: Well-positioned right hip hemiarthroplasty. Electronically Signed   By: Amie Portlandavid  Ormond M.D.   On: 12/06/2016 09:50   Dg Hip Unilat  With Pelvis 2-3 Views Right  Result Date: 12/05/2016 CLINICAL DATA:  Acute onset of syncope and fall. Right hip pain. Initial encounter. EXAM: DG HIP (WITH OR WITHOUT PELVIS) 2-3V RIGHT COMPARISON:  None. FINDINGS: There is a mildly displaced subcapital fracture through the right femoral neck, with superior displacement of the distal femur. The right femoral head remains seated at the acetabulum. The left hip arthroplasty is grossly unremarkable. Mild degenerative change is noted at the lower lumbar spine. The sacroiliac joints are unremarkable in appearance. The visualized bowel gas pattern is grossly unremarkable in appearance. IMPRESSION: Mildly displaced subcapital fracture through the right femoral neck, with superior displacement of the distal femur. Electronically Signed   By: Roanna RaiderJeffery  Chang M.D.   On: 12/05/2016 17:58    Scheduled Meds: . atorvastatin  20 mg Oral QHS  . calcium-vitamin D  1 tablet Oral BID  . cholecalciferol  4,000 Units Oral QHS  . citalopram  40 mg Oral QHS  . donepezil  10 mg Oral QHS  . enoxaparin (LOVENOX) injection  30 mg Subcutaneous Q24H  . feeding supplement (ENSURE ENLIVE)  237 mL Oral BID BM  .  memantine  5 mg Oral QHS  . multivitamin  1 tablet Oral QHS  . omega-3 acid ethyl esters  1 g Oral Daily  . oseltamivir  30 mg Oral BID  . timolol  1 drop Both Eyes BID   Continuous Infusions: . sodium chloride      Principal Problem:   Closed right hip fracture, initial encounter [facility]) Active Problems:   Pure hypercholesterolemia   Depression   Dizziness   Dementia   Fall   Cough   Influenza A    Time spent: 30 minutes    Vassie Loll  [facility] Pager [phone]. If 7PM-7AM, please contact night-coverage at [email or web address], password [facility] 12/07/2016, 5:07 PM  LOS: 2 days

## 2016-12-07 NOTE — Progress Notes (Signed)
Pt o2 sats kept above 90% by using 3 lpm. Called Resp therapy for treatment. Kennesha Brewbaker, [employer]

## 2016-12-07 NOTE — Progress Notes (Signed)
Initial Nutrition Assessment  INTERVENTION:   Provide Ensure Enlive po BID, each supplement provides 350 kcal and 20 grams of protein Encourage PO intake RD to continue to monitor  NUTRITION DIAGNOSIS:   Increased nutrient needs related to other (see comment) (hip fracture) as evidenced by estimated needs.  GOAL:   Patient will meet greater than or equal to 90% of their needs  MONITOR:   PO intake, Supplement acceptance, Labs, Weight trends, I & O's  REASON FOR ASSESSMENT:   Consult Hip fracture protocol  ASSESSMENT:    83 year old female with multiple medical problems including dementia hyperlipidemia anxiety aortic insufficiency previous left femoral neck fracture treated with hemiarthroplasty 2017, recent problems with flu despite having gotten the flu vaccine. Patient started Tamiflu. Patient had an independent living facility her son is with her today and she fell suffering a right femoral neck fracture which is displaced. 1/28: s/p Right hip hemiarthroplasty for femoral neck fracture  Patient in room with son at bedside. Pt reports poor appetite. PO intake: 15%. Pt states she is waiting on her breakfast tray of pancakes, pineapple and coffee. Pt states she normally eats twice a day. Breakfast is normally jelly toast with coffee and dinner is usually consists of meat, vegetable and starch. Pt states that she drinks Ensure supplements at the facility she is from. RD will order.  Per chart review, pt's weight has remained stable. Nutrition-Focused physical exam completed. Findings are no fat depletion, moderate muscle depletion, and no edema.   Medications: OSCAL w/ D tablet daily, Vitamin D tablet daily, multivitamin tablet daily, Lovaza capsule daily,  Labs reviewed: Low Na  Diet Order:  Diet regular Room service appropriate? No; Fluid consistency: Thin  Skin:  Wound (see comment) (1/27 leg incision)  Last BM:  1/26  Height:   Ht Readings from Last 1 Encounters:   12/05/16 5\' 9"  (1.753 m)    Weight:   Wt Readings from Last 1 Encounters:  12/05/16 158 lb (71.7 kg)    Ideal Body Weight:  65.9 kg  BMI:  Body mass index is 23.33 kg/m.  Estimated Nutritional Needs:   Kcal:  1600-1800  Protein:  70-80g  Fluid:  1.8L/day  EDUCATION NEEDS:   No education needs identified at this time  Tilda FrancoLindsey Moira Umholtz, MS, RD, LDN Pager: [phone] After Hours Pager: [phone]

## 2016-12-08 ENCOUNTER — Encounter (HOSPITAL_COMMUNITY): Payer: Self-pay | Admitting: Orthopaedic Surgery

## 2016-12-08 LAB — BASIC METABOLIC PANEL
Anion gap: 5 (ref 5–15)
BUN: 9 mg/dL (ref 6–20)
CHLORIDE: 97 mmol/L — AB (ref 101–111)
CO2: 31 mmol/L (ref 22–32)
Calcium: 8.2 mg/dL — ABNORMAL LOW (ref 8.9–10.3)
Creatinine, Ser: 0.65 mg/dL (ref 0.44–1.00)
GFR calc non Af Amer: >60 mL/min (ref >60)
Glucose, Bld: 100 mg/dL — ABNORMAL HIGH (ref 65–99)
POTASSIUM: 3.6 mmol/L (ref 3.5–5.1)
SODIUM: 133 mmol/L — AB (ref 135–145)

## 2016-12-08 LAB — CBC
HCT: 29.4 % — ABNORMAL LOW (ref 36.0–46.0)
HEMOGLOBIN: 9.9 g/dL — AB (ref 12.0–15.0)
MCH: 30 pg (ref 26.0–34.0)
MCHC: 33.7 g/dL (ref 30.0–36.0)
MCV: 89.1 fL (ref 78.0–100.0)
Platelets: 118 10*3/uL — ABNORMAL LOW (ref 150–400)
RBC: 3.3 MIL/uL — AB (ref 3.87–5.11)
RDW: 14.1 % (ref 11.5–15.5)
WBC: 7.6 10*3/uL (ref 4.0–10.5)

## 2016-12-08 MED ORDER — ENOXAPARIN SODIUM 30 MG/0.3ML ~~LOC~~ SOLN: 30.0000 mg | SUBCUTANEOUS | 0 refills | Status: DC

## 2016-12-08 MED ORDER — HYDROCODONE-ACETAMINOPHEN 5-325 MG PO TABS: 1.0000 | ORAL_TABLET | Freq: Four times a day (QID) | ORAL | 0 refills | Status: AC | PRN

## 2016-12-08 NOTE — Discharge Instructions (Addendum)
INSTRUCTIONS AFTER  HIP JOINT REPLACEMENT   o Remove items at home which could result in a fall. This includes throw rugs or furniture in walking pathways o ICE to the affected joint every three hours while awake for 30 minutes at a time, for at least the first 3-5 days, and then as needed for pain and swelling.  Continue to use ice for pain and swelling. You may notice swelling that will progress down to the foot and ankle.  This is normal after surgery.  Elevate your leg when you are not up walking on it.   o Continue to use the breathing machine you got in the hospital (incentive spirometer) which will help keep your temperature down.  It is common for your temperature to cycle up and down following surgery, especially at night when you are not up moving around and exerting yourself.  The breathing machine keeps your lungs expanded and your temperature down.   DIET:  As you were doing prior to hospitalization, we recommend a well-balanced diet.  DRESSING / WOUND CARE / SHOWERING  You may change your dressing 3-5 days after surgery.  Then change the dressing every day with sterile gauze.  Please use good hand washing techniques before changing the dressing.  Do not use any lotions or creams on the incision until instructed by your surgeon. -RN at facility to do DAILY wound checks.  Staples will be removed at 2 week postop appointment in our office.  If any questions regarding wound healing we should be contacted immediately.    ACTIVITY  o Increase activity slowly as tolerated, but follow the weight bearing instructions below.   o No driving for 6 weeks or until further direction given by your physician.  You cannot drive while taking narcotics.  o No lifting or carrying greater than 10 lbs. until further directed by your surgeon. o Avoid periods of inactivity such as sitting longer than an hour when not asleep. This helps prevent blood clots.  o You may return to work once you are authorized  by your doctor.     WEIGHT BEARING   Weight bearing as tolerated with assist device (walker, cane, etc) as directed, use it as long as suggested by your surgeon or therapist, typically at least 4-6 weeks.   EXERCISES  Results after joint replacement surgery are often greatly improved when you follow the exercise, range of motion and muscle strengthening exercises prescribed by your doctor. Safety measures are also important to protect the joint from further injury. Any time any of these exercises cause you to have increased pain or swelling, decrease what you are doing until you are comfortable again and then slowly increase them. If you have problems or questions, call your caregiver or physical therapist for advice.   Rehabilitation is important following a joint replacement. After just a few days of immobilization, the muscles of the leg can become weakened and shrink (atrophy).  These exercises are designed to build up the tone and strength of the thigh and leg muscles and to improve motion. Often times heat used for twenty to thirty minutes before working out will loosen up your tissues and help with improving the range of motion but do not use heat for the first two weeks following surgery (sometimes heat can increase post-operative swelling).   These exercises can be done on a training (exercise) mat, on the floor, on a table or on a bed. Use whatever works the best and is most comfortable  for you.    Use music or television while you are exercising so that the exercises are a pleasant break in your day. This will make your life better with the exercises acting as a break in your routine that you can look forward to.   Perform all exercises about fifteen times, three times per day or as directed.  You should exercise both the operative leg and the other leg as well.  Exercises include:    Quad Sets - Tighten up the muscle on the front of the thigh (Quad) and hold for 5-10 seconds.     Straight Leg Raises - With your knee straight (if you were given a brace, keep it on), lift the leg to 60 degrees, hold for 3 seconds, and slowly lower the leg.  Perform this exercise against resistance later as your leg gets stronger.   Leg Slides: Lying on your back, slowly slide your foot toward your buttocks, bending your knee up off the floor (only go as far as is comfortable). Then slowly slide your foot back down until your leg is flat on the floor again.   Angel Wings: Lying on your back spread your legs to the side as far apart as you can without causing discomfort.   Hamstring Strength:  Lying on your back, push your heel against the floor with your leg straight by tightening up the muscles of your buttocks.  Repeat, but this time bend your knee to a comfortable angle, and push your heel against the floor.  You may put a pillow under the heel to make it more comfortable if necessary.   A rehabilitation program following joint replacement surgery can speed recovery and prevent re-injury in the future due to weakened muscles. Contact your doctor or a physical therapist for more information on knee rehabilitation.    CONSTIPATION  Constipation is defined medically as fewer than three stools per week and severe constipation as less than one stool per week.  Even if you have a regular bowel pattern at home, your normal regimen is likely to be disrupted due to multiple reasons following surgery.  Combination of anesthesia, postoperative narcotics, change in appetite and fluid intake all can affect your bowels.   YOU MUST use at least one of the following options; they are listed in order of increasing strength to get the job done.  They are all available over the counter, and you may need to use some, POSSIBLY even all of these options:    Drink plenty of fluids (prune juice may be helpful) and high fiber foods Colace 100 mg by mouth twice a day  Senokot for constipation as directed and as  needed Dulcolax (bisacodyl), take with full glass of water  Miralax (polyethylene glycol) once or twice a day as needed.  If you have tried all these things and are unable to have a bowel movement in the first 3-4 days after surgery call either your surgeon or your primary doctor.    If you experience loose stools or diarrhea, hold the medications until you stool forms back up.  If your symptoms do not get better within 1 week or if they get worse, check with your doctor.  If you experience "the worst abdominal pain ever" or develop nausea or vomiting, please contact the office immediately for further recommendations for treatment.   ITCHING:  If you experience itching with your medications, try taking only a single pain pill, or even half a pain pill at a  time.  You can also use Benadryl over the counter for itching or also to help with sleep.   TED HOSE STOCKINGS:  Use stockings on both legs until for at least 2 weeks or as directed by physician office. They may be removed at night for sleeping.  MEDICATIONS:  See your medication summary on the After Visit Summary that nursing will review with you.  You may have some home medications which will be placed on hold until you complete the course of blood thinner medication.  It is important for you to complete the blood thinner medication as prescribed.  PRECAUTIONS:  If you experience chest pain or shortness of breath - call 911 immediately for transfer to the hospital emergency department.   If you develop a fever greater that 101 F, purulent drainage from wound, increased redness or drainage from wound, foul odor from the wound/dressing, or calf pain - CONTACT YOUR SURGEON.                                                   FOLLOW-UP APPOINTMENTS:  If you do not already have a post-op appointment, please call the office for an appointment to be seen by your surgeon.  Guidelines for how soon to be seen are listed in your After Visit Summary, but  are typically between 1-4 weeks after surgery.  OTHER INSTRUCTIONS:   Knee Replacement:  Do not place pillow under knee, focus on keeping the knee straight while resting. CPM instructions: 0-90 degrees, 2 hours in the morning, 2 hours in the afternoon, and 2 hours in the evening. Place foam block, curve side up under heel at all times except when in CPM or when walking.  DO NOT modify, tear, cut, or change the foam block in any way.  MAKE SURE YOU:   Understand these instructions.   Get help right away if you are not doing well or get worse.    Thank you for letting us be a part of your medical care team.  It is a privilege we respect greatly.  We hope these instructions will help you stay on track for a fast and full recovery!

## 2016-12-08 NOTE — NC FL2 (Signed)
MEDICAID FL2 LEVEL OF CARE SCREENING TOOL     IDENTIFICATION  Patient Name: Gabrielle [name] Birthdate: [birth year] Sex: female Admission Date (Current Location): 12/05/2016  [facility] and [place] Number:  Producer, television/film/video and Address:  [facility],  [address] [place] [address]      Provider Number: [id number]  Attending Physician Name and Address:  Vassie Loll, MD  Relative Name and Phone Number:       Current Level of Care: Hospital Recommended Level of Care: Skilled Nursing Facility Prior Approval Number:    Date Approved/Denied:   PASRR Number: [id number]  Discharge Plan: SNF    Current Diagnoses: Patient Active Problem List   Diagnosis Date Noted  . Influenza A 12/06/2016  . Closed right hip fracture, initial encounter (HCC) 12/05/2016  . Dementia 12/05/2016  . Fall 12/05/2016  . Cough 12/05/2016  . S/P hip hemiarthroplasty 09/16/2016  . Mallet finger, left small finger 09/16/2016  . Hip fracture (HCC) 08/30/2016  . Vascular dementia 08/30/2016  . Osteoporosis 08/30/2016  . Vaginal candida 08/30/2016  . UTI (urinary tract infection) 08/30/2016  . Mild aortic insufficiency: Per 2 d echo 08/04/2015 08/04/2015  . Moderate mitral regurgitation: Per 2 d echo 08/04/2015 08/04/2015  . Glaucoma 08/04/2015  . Dizziness 08/04/2015  . Syncope 08/03/2015  . Depression 12/09/2013  . Constipation 11/30/2013  . Pure hypercholesterolemia 11/30/2013  . Acute posthemorrhagic anemia 11/30/2013  . OA (osteoarthritis) of knee 11/21/2013    Orientation RESPIRATION BLADDER Height & Weight     Self, Place  Normal Continent Weight: 158 lb (71.7 kg) Height:  5\' 9"  (175.3 cm)  BEHAVIORAL SYMPTOMS/MOOD NEUROLOGICAL BOWEL NUTRITION STATUS  Other (Comment) (no behaviors)   Continent Diet  AMBULATORY STATUS COMMUNICATION OF NEEDS Skin   Extensive Assist Verbally Surgical wounds                       Personal Care Assistance Level of  Assistance  Bathing, Feeding, Dressing Bathing Assistance: Limited assistance Feeding assistance: Independent Dressing Assistance: Limited assistance     Functional Limitations Info  Sight, Hearing, Speech Sight Info: Adequate Hearing Info: Adequate Speech Info: Adequate    SPECIAL CARE FACTORS FREQUENCY  PT (By licensed PT), OT (By licensed OT)     PT Frequency: 5x wk OT Frequency: 5x wk            Contractures Contractures Info: Not present    Additional Factors Info  Code Status Code Status Info: DNR             Current Medications (12/08/2016):  This is the current hospital active medication list Current Facility-Administered Medications  Medication Dose Route Frequency Provider Last Rate Last Dose  . 0.9 %  sodium chloride infusion   Intravenous Continuous Vassie Loll, MD 65 mL/hr at 12/07/16 1751    . acetaminophen (TYLENOL) tablet 650 mg  650 mg Oral Q6H PRN Eldred Manges, MD       Or  . acetaminophen (TYLENOL) suppository 650 mg  650 mg Rectal Q6H PRN Eldred Manges, MD      . albuterol (PROVENTIL) (2.5 MG/3ML) 0.083% nebulizer solution 2.5 mg  2.5 mg Nebulization Q6H PRN Lorretta Harp, MD   2.5 mg at 12/07/16 1358  . atorvastatin (LIPITOR) tablet 20 mg  20 mg Oral QHS Lorretta Harp, MD   20 mg at 12/07/16 2147  . bisacodyl (DULCOLAX) EC tablet 5 mg  5 mg Oral Daily PRN  Lorretta HarpXilin Niu, MD      . budesonide (PULMICORT) nebulizer solution 0.25 mg  0.25 mg Nebulization BID Vassie Lollarlos Madera, MD   0.25 mg at 12/08/16 0744  . calcium-vitamin D (OSCAL WITH D) 500-200 MG-UNIT per tablet 1 tablet  1 tablet Oral BID Vassie Lollarlos Madera, MD   1 tablet at 12/08/16 0919  . cholecalciferol (VITAMIN D) tablet 4,000 Units  4,000 Units Oral QHS Lorretta HarpXilin Niu, MD   4,000 Units at 12/07/16 2147  . citalopram (CELEXA) tablet 40 mg  40 mg Oral QHS Lorretta HarpXilin Niu, MD   40 mg at 12/07/16 2147  . dextromethorphan (DELSYM) 30 MG/5ML liquid 30 mg  30 mg Oral Q12H PRN Vassie Lollarlos Madera, MD   30 mg at 12/08/16 1154  .  donepezil (ARICEPT) tablet 10 mg  10 mg Oral QHS Lorretta HarpXilin Niu, MD   10 mg at 12/07/16 2148  . enoxaparin (LOVENOX) injection 30 mg  30 mg Subcutaneous Q24H Eldred MangesMark C Yates, MD   30 mg at 12/08/16 0919  . feeding supplement (ENSURE ENLIVE) (ENSURE ENLIVE) liquid 237 mL  237 mL Oral BID BM Vassie Lollarlos Madera, MD   237 mL at 12/08/16 0920  . HYDROcodone-acetaminophen (NORCO/VICODIN) 5-325 MG per tablet 1 tablet  1 tablet Oral Q6H PRN Vassie Lollarlos Madera, MD      . memantine [facility](NAMENDA) tablet 5 mg  5 mg Oral QHS Lorretta HarpXilin Niu, MD   5 mg at 12/07/16 2148  . menthol-cetylpyridinium (CEPACOL) lozenge 3 mg  1 lozenge Oral PRN Eldred MangesMark C Yates, MD       Or  . phenol (CHLORASEPTIC) mouth spray 1 spray  1 spray Mouth/Throat PRN Eldred MangesMark C Yates, MD      . methocarbamol (ROBAXIN) tablet 500 mg  500 mg Oral Q8H PRN Lorretta HarpXilin Niu, MD   500 mg at 12/08/16 0919  . metoCLOPramide (REGLAN) tablet 5-10 mg  5-10 mg Oral Q8H PRN Eldred MangesMark C Yates, MD       Or  . metoCLOPramide (REGLAN) injection 5-10 mg  5-10 mg Intravenous Q8H PRN Eldred MangesMark C Yates, MD      . multivitamin (PROSIGHT) tablet 1 tablet  1 tablet Oral QHS Lorretta HarpXilin Niu, MD   1 tablet at 12/07/16 2147  . omega-3 acid ethyl esters (LOVAZA) capsule 1 g  1 g Oral Daily Lorretta HarpXilin Niu, MD   1 g at 12/08/16 0919  . ondansetron (ZOFRAN) tablet 4 mg  4 mg Oral Q6H PRN Eldred MangesMark C Yates, MD       Or  . ondansetron [facility](ZOFRAN) injection 4 mg  4 mg Intravenous Q6H PRN Eldred MangesMark C Yates, MD      . oseltamivir (TAMIFLU) capsule 30 mg  30 mg Oral BID Donell BeersLeann T Poindexter, RPH   30 mg at 12/08/16 0919  . senna-docusate (Senokot-S) tablet 1 tablet  1 tablet Oral QHS PRN Lorretta HarpXilin Niu, MD      . timolol (TIMOPTIC) 0.25 % ophthalmic solution 1 drop  1 drop Both Eyes BID Lorretta HarpXilin Niu, MD   1 drop at 12/08/16 0921  . zolpidem (AMBIEN) tablet 5 mg  5 mg Oral QHS PRN Lorretta HarpXilin Niu, MD         Discharge Medications: Please see discharge summary for a list of discharge medications.  Relevant Imaging Results:  Relevant Lab Results:   Additional  Information SSN:  [id number]  Jaisa Defino, Dickey GaveJamie Lee, LCSW

## 2016-12-08 NOTE — Clinical Social Work Placement (Signed)
   CLINICAL SOCIAL WORK PLACEMENT  NOTE  Date:  12/08/2016  Patient Details  Name: Gabrielle [name] MRN: [id number] Date of Birth: 08-Jul-1927  Clinical Social Work is seeking post-discharge placement for this patient at the Skilled  Nursing Facility level of care (*CSW will initial, date and re-position this form in  chart as items are completed):  Yes   Patient/family provided with [place] Clinical Social Work Department's list of facilities offering this level of care within the geographic area requested by the patient (or if unable, by the patient's family).  Yes   Patient/family informed of their freedom to choose among providers that offer the needed level of care, that participate in Medicare, Medicaid or managed care program needed by the patient, have an available bed and are willing to accept the patient.  Yes   Patient/family informed of [facility] ownership interest in [facility] and [facility], as well as of the fact that they are under no obligation to receive care at these facilities.  PASRR submitted to EDS on       PASRR number received on       Existing PASRR number confirmed on 12/01/16     FL2 transmitted to all facilities in geographic area requested by pt/family on 12/08/16     FL2 transmitted to all facilities within larger geographic area on       Patient informed that his/her managed care company has contracts with or will negotiate with certain facilities, including the following:            Patient/family informed of bed offers received.  Patient chooses bed at       Physician recommends and patient chooses bed at      Patient to be transferred to   on  .  Patient to be transferred to facility by       Patient family notified on   of transfer.  Name of family member notified:        PHYSICIAN       Additional Comment:    _______________________________________________ Royetta AsalHaidinger, Oluwatobi Ruppe Lee, LCSW  [phone] 12/08/2016,  3:52 PM

## 2016-12-08 NOTE — Progress Notes (Signed)
CSW contacted pt's son to assist with d/c planning. PT was sleeping soundly. Pt was hospitalized from [facility] on 12/05/16 with a right hip fx. PT has recommended SNF at d/c. Pt's son is in agreement with plan and SNF search has been initiated. Bed offers are pending. [facility] past. CSW has contacted [facility]missions [facility] to request placement per son's request. CSW will continue to follow to assist with d/c planning.   Cori RazorJamie Ayva Veilleux LCSW [phone]

## 2016-12-08 NOTE — Progress Notes (Signed)
Subjective: Doing ok.  Hip pain controlled.  Needs snf placement.     Objective: Vital signs in last 24 hours: Temp:  [98.5 F (36.9 C)-100.1 F (37.8 C)] 99.5 F (37.5 C) (01/29 1818) Pulse Rate:  [63-70] 70 (01/29 1818) Resp:  [15-16] 15 (01/29 1818) BP: (101-113)/(44-55) 110/44 (01/29 1818) SpO2:  [91 %-95 %] 91 % (01/29 1818)  Intake/Output from previous day: 01/28 0701 - 01/29 0700 In: 3334.8 [P.O.:700; I.V.:2634.8] Out: 950 [Urine:950] Intake/Output this shift: Total I/O In: 360 [P.O.:360] Out: 400 [Urine:400]   Recent Labs  12/06/16 0435 12/07/16 0435 12/08/16 0416  HGB 11.9* 10.5* 9.9*    Recent Labs  12/07/16 0435 12/08/16 0416  WBC 6.9 7.6  RBC 3.50* 3.30*  HCT 31.7* 29.4*  PLT 128* 118*    Recent Labs  12/07/16 0435 12/08/16 0416  NA 134* 133*  K 3.6 3.6  CL 98* 97*  CO2 31 31  BUN 10 9  CREATININE 0.57 0.65  GLUCOSE 87 100*  CALCIUM 7.8* 8.2*   No results for input(s): LABPT, INR in the last 72 hours.  Exam:  Alert and oriented. Wound looks good.  No drainage or signs of infections.  bilat calves nontender.  NVI.    Assessment/Plan: Discussed possible [facility] when medically stable.  Continue present care.    Gabrielle [name] 12/08/2016, 6:30 PM

## 2016-12-08 NOTE — Progress Notes (Signed)
Spoke with pt concerning HH needs. Pt will discharge with [facility] and D[facility].

## 2016-12-08 NOTE — Progress Notes (Signed)
[facility] PROGRESS NOTE  Gabrielle [name] WUJ:[id number] DOB: 16-Jun-1927 DOA: 12/05/2016 PCP: [name],[name], MD  Interim summary and HPI 83 y.o. female with medical history significant of dementia, hyperlipidemia, anxiety, mitral valve prolapse, aortic valve insufficiency, glaucoma, arthritis, using walker, who presents with fall and right hip injury.  Per pt's son, pt has been mildly sick in the past several days. She has mild dry cough and mild running nose. She also has increased urinary frequency, but no dysuria or burning on urination. No chest pain or shortness of breath. She has nausea, but no vomiting, abdominal pain or diarrhea.  Pt states that she got dizzy in this afternoon, fell when she tried to reach some thing from, injured her right hip, caused severe pain. She did not have unilateral weakness, numbness in extremities, no vision change or hearing loss. Per her son, patient did not have LOC. She denies any numbness of her right leg. Please right hip pain is constant, sharp, 8 out of 10 in severity, nonradiating. Patient underwent left hip arthroplasty in 2017 with Dr. Ophelia CharterYates.  Assessment/Plan: Closed right hip fracture, initial encounter [facility](HCC): As evidenced by x-ray. Patient has no signs for neurovascular compromise.  -status post right hemiarthroplasty by Dr. Ophelia CharterYates -will follow post operative rec's -continue PRN analgesics (but will be careful with given soft BP and current worsening confusion) -following PT/OT assessment and rec's; SW consulted for nursing home placement  -follow Hgb and electrolytes -lovenox for DVT prophylaxis   Fall and dizziness: Likely due to multifactorial etiology, including upper respiratory viral infection (Flu), deconditioning, ongoing dementia and dehydration. No focal neurological findings, less likely to have stroke. CT of the head and C-spine negative for acute abnormalities. -will continue IVF's -treatment for Flu as mentioned  below  -continue supportive care -will follow rec's from PT/OT  Influenza A with respiratory symptoms -will continue tamiflu and supportive care -cough is dry, will use Delsym only -will continue pulmicort and PRN albuterol  HLD:  -Continue lipitor  Dementia: -patient somnolent, but oriented X 2. No agitation; answering questions properly -Continue Namenda, donepezil -at risk for hospital acquired delirium  -poor insight demonstrated currently  Depression: Stable, no suicidal or homicidal ideations. -Continue Celexa  Code Status: DNR Family Communication: son at bedside  Disposition Plan: to be determined; will need SNF for rehabilitation. Will follow post operative rec's from orthopedic service, continue treatment for Flu; weaning oxygen off as tolerated.   Consultants:  Orthopedic service   Procedures: Right hip hemiarthroplasty for femoral neck fracture 1/27.  Antibiotics:  Tamiflu 1/26  HPI/Subjective: No fever. Status post right hip surgery (with spinal block) on 1/27. Reporting pain is mild at this time and no complaining of CP. SOB and hypoxia appreciated during physical therapy evaluation and ended requiring O2 supplementation. Patient also oriented X 2 only.  Objective: Vitals:   12/08/16 0746 12/08/16 1337  BP:  (!) 101/45  Pulse: 66 65  Resp: 16 16  Temp:  98.5 F (36.9 C)    Intake/Output Summary (Last 24 hours) at 12/08/16 1614 Last data filed at 12/08/16 1345  Gross per 24 hour  Intake          3035.67 ml  Output              875 ml  Net          2160.67 ml   Filed Weights   12/05/16 1712  Weight: 71.7 kg (158 lb)    Exam:   General:  Afebrile, able  to follow commands properly and reporting pain to be mild at this time. Patient is status post Right hip hemiarthroplasty for femoral neck fracture on 1/27. Patient oriented X2, even somnolent, answering questions properly. Per nursing staff less cough appreciated. But still requiring O2  supplementation   Cardiovascular: S1 and s2, no rubs, no gallops, positive murmur   Respiratory: scattered rhonchi, no crackles; positive wheezing. Patient not requiring accessory muscles   Abdomen: soft, NT, ND, positive BS  Musculoskeletal: no edema, no cyanosis. Right hip with clean and intact dressings   Data Reviewed: Basic Metabolic Panel:  Recent Labs Lab 12/05/16 1822 12/06/16 0435 12/07/16 0435 12/08/16 0416  NA 132* 134* 134* 133*  K 4.9 3.8 3.6 3.6  CL 96* 98* 98* 97*  CO2 28 31 31 31   GLUCOSE 95 88 87 100*  BUN 14 13 10 9   CREATININE 0.89 0.74 0.57 0.65  CALCIUM 8.3* 8.4* 7.8* 8.2*   Liver Function Tests:  Recent Labs Lab 12/05/16 1822  AST 82*  ALT 42  ALKPHOS 46  BILITOT 1.2  PROT 6.9  ALBUMIN 3.8   CBC:  Recent Labs Lab 12/05/16 1822 12/06/16 0435 12/07/16 0435 12/08/16 0416  WBC 5.8 6.0 6.9 7.6  NEUTROABS 4.0  --   --   --   HGB 12.8 11.9* 10.5* 9.9*  HCT 38.9 36.3 31.7* 29.4*  MCV 88.8 91.2 90.6 89.1  PLT 176 143* 128* 118*   CBG: No results for input(s): GLUCAP in the last 168 hours.  Recent Results (from the past 240 hour(s))  MRSA PCR Screening     Status: None   Collection Time: 12/06/16  3:34 AM  Result Value Ref Range Status   MRSA by PCR NEGATIVE NEGATIVE Final    Comment:        The GeneXpert MRSA Assay (FDA approved for NASAL specimens only), is one component of a comprehensive MRSA colonization surveillance program. It is not intended to diagnose MRSA infection nor to guide or monitor treatment for MRSA infections.      Studies: No results found.  Scheduled Meds: . atorvastatin  20 mg Oral QHS  . budesonide (PULMICORT) nebulizer solution  0.25 mg Nebulization BID  . calcium-vitamin D  1 tablet Oral BID  . cholecalciferol  4,000 Units Oral QHS  . citalopram  40 mg Oral QHS  . donepezil  10 mg Oral QHS  . enoxaparin (LOVENOX) injection  30 mg Subcutaneous Q24H  . feeding supplement (ENSURE ENLIVE)  237 mL  Oral BID BM  . memantine  5 mg Oral QHS  . multivitamin  1 tablet Oral QHS  . omega-3 acid ethyl esters  1 g Oral Daily  . oseltamivir  30 mg Oral BID  . timolol  1 drop Both Eyes BID   Continuous Infusions: . sodium chloride 65 mL/hr at 12/07/16 1751    Principal Problem:   Closed right hip fracture, initial encounter [facility]) Active Problems:   Pure hypercholesterolemia   Depression   Dizziness   Dementia   Fall   Cough   Influenza A    Time spent: 30 minutes    Vassie Loll  [facility] Pager [phone]. If 7PM-7AM, please contact night-coverage at [email or web address], password [facility] 12/08/2016, 4:14 PM  LOS: 3 days

## 2016-12-09 ENCOUNTER — Inpatient Hospital Stay (HOSPITAL_COMMUNITY): Payer: Medicare Other

## 2016-12-09 DIAGNOSIS — S72001D Fracture of unspecified part of neck of right femur, subsequent encounter for closed fracture with routine healing: Secondary | ICD-10-CM

## 2016-12-09 DIAGNOSIS — R0602 Shortness of breath: Secondary | ICD-10-CM

## 2016-12-09 LAB — CBC
HCT: 28.5 % — ABNORMAL LOW (ref 36.0–46.0)
Hemoglobin: 9.4 g/dL — ABNORMAL LOW (ref 12.0–15.0)
MCH: 29.6 pg (ref 26.0–34.0)
MCHC: 33 g/dL (ref 30.0–36.0)
MCV: 89.6 fL (ref 78.0–100.0)
PLATELETS: 123 10*3/uL — AB (ref 150–400)
RBC: 3.18 MIL/uL — AB (ref 3.87–5.11)
RDW: 14.1 % (ref 11.5–15.5)
WBC: 8.8 10*3/uL (ref 4.0–10.5)

## 2016-12-09 MED ORDER — DM-GUAIFENESIN ER 30-600 MG PO TB12
1.0000 | ORAL_TABLET | Freq: Two times a day (BID) | ORAL | Status: DC
  Administered 2016-12-09 – 2016-12-10 (×3): 1 via ORAL
  Filled 2016-12-09 (×3): qty 1

## 2016-12-09 MED ORDER — ENSURE ENLIVE PO LIQD
237.0000 mL | Freq: Two times a day (BID) | ORAL | Status: AC
Start: 2016-12-10 — End: ?

## 2016-12-09 MED ORDER — SENNOSIDES-DOCUSATE SODIUM 8.6-50 MG PO TABS: 1.0000 | ORAL_TABLET | Freq: Every evening | ORAL | Status: DC | PRN

## 2016-12-09 MED ORDER — ALBUTEROL SULFATE (2.5 MG/3ML) 0.083% IN NEBU: 2.5000 mg | INHALATION_SOLUTION | Freq: Four times a day (QID) | RESPIRATORY_TRACT | Status: AC | PRN

## 2016-12-09 MED ORDER — SODIUM CHLORIDE 0.9 % IV BOLUS (SEPSIS)
250.0000 mL | Freq: Once | INTRAVENOUS | Status: AC
  Administered 2016-12-09: 11:00:00 250 mL via INTRAVENOUS

## 2016-12-09 MED ORDER — DM-GUAIFENESIN ER 30-600 MG PO TB12: 1.0000 | ORAL_TABLET | Freq: Two times a day (BID) | ORAL | Status: AC

## 2016-12-09 MED ORDER — OSELTAMIVIR PHOSPHATE 30 MG PO CAPS: 30.0000 mg | ORAL_CAPSULE | Freq: Two times a day (BID) | ORAL | 0 refills | Status: DC

## 2016-12-09 NOTE — Progress Notes (Signed)
Manual BP 88/46. Patient alert, denies pain. Dr. Gwenlyn PerkingMadera notified. Orders for bolus given.

## 2016-12-09 NOTE — Care Management Note (Signed)
Case Management Note  Patient Details  Name: Gabrielle LeanBarbara A Ludlam MRN: [id number] Date of Birth: [birth year]  Subjective/Objective:                  Closed right hip fracture Action/Plan: Discharge planning Expected Discharge Date:   (unknown)               Expected Discharge Plan:  Skilled Nursing Facility  In-House Referral:     Discharge planning Services  CM Consult  Post Acute Care Choice:    Choice offered to:     DME Arranged:    DME Agency:  NA  HH Arranged:  NA HH Agency:  NA  Status of Service:  Completed, signed off  If discussed at Long Length of Stay Meetings, dates discussed:    Additional Comments: CM notes plan is for pt to go to SNF; CSW aware and arranging. No other CM needs were communicated. Yves DillJeffries, Acelynn Dejonge Christine, RN 12/09/2016, 10:39 AM

## 2016-12-09 NOTE — Progress Notes (Signed)
Subjective: Doing well.  States that she feels better today.    Objective: Vital signs in last 24 hours: Temp:  [98.4 F (36.9 C)-100.3 F (37.9 C)] 98.4 F (36.9 C) (01/30 0551) Pulse Rate:  [56-70] 56 (01/30 0942) Resp:  [15-16] 16 (01/30 0942) BP: (96-120)/(41-53) 102/50 (01/30 1222) SpO2:  [91 %-97 %] 97 % (01/30 1111)  Intake/Output from previous day: 01/29 0701 - 01/30 0700 In: 1444.3 [P.O.:660; I.V.:784.3] Out: 855 [Urine:855] Intake/Output this shift: Total I/O In: 960 [P.O.:960] Out: -    Recent Labs  12/07/16 0435 12/08/16 0416 12/09/16 0419  HGB 10.5* 9.9* 9.4*    Recent Labs  12/08/16 0416 12/09/16 0419  WBC 7.6 8.8  RBC 3.30* 3.18*  HCT 29.4* 28.5*  PLT 118* 123*    Recent Labs  12/07/16 0435 12/08/16 0416  NA 134* 133*  K 3.6 3.6  CL 98* 97*  CO2 31 31  BUN 10 9  CREATININE 0.57 0.65  GLUCOSE 87 100*  CALCIUM 7.8* 8.2*   No results for input(s): LABPT, INR in the last 72 hours.  Exam:  Alert and oriented.  Hip wound looks good. Staples intact. Minimal bloody drainage. No signs of infection.  bilat calves nontender.  NVI.    Assessment/Plan: Stable from ortho standpoint. Transfer to SNF when cleared by medicine service.     Zonia KiefJames Clarisse Rodriges 12/09/2016, 2:06 PM

## 2016-12-09 NOTE — Progress Notes (Signed)
CSW assisting with d/c planning. [facility] if stable for d/c. CSW will continue to follow to assist with d/c planning needs.  Cori RazorJamie Lazariah Savard LCSW [phone]

## 2016-12-09 NOTE — Progress Notes (Signed)
Physical Therapy Treatment Patient Details Name: Gabrielle [name] MRN: [id number] DOB: 21-Aug-1927 Today's Date: 12/09/2016    History of Present Illness 83 y.o.femalewho presents with fall and right hip injury; xray=R femoral neck fx-->s/p right hip posterior hemiarthroplasty on 12/06/16 per Dr. Ophelia [name];  medical history significant of dementia, hyperlipidemia, anxiety, mitral valve prolapse, aortic valve insufficiency, glaucoma, arthritis, L hip hemi 2017.     PT Comments    Pt progressing slowly d/t low BP/medical issues, CXR pending; will continue to follow in acute setting; continue to recommend SNF  Follow Up Recommendations  SNF;Supervision/Assistance - 24 hour     Equipment Recommendations  None recommended by PT    Recommendations for Other Services       Precautions / Restrictions Precautions Precautions: Fall;Posterior Hip Required Braces or Orthoses: Knee Immobilizer - Right Restrictions Weight Bearing Restrictions: No RLE Weight Bearing: Weight bearing as tolerated    Mobility  Bed Mobility               General bed mobility comments:  (deferred d/t low BP)  Transfers                    Ambulation/Gait                 Stairs            Wheelchair Mobility    Modified Rankin (Stroke Patients Only)       Balance                                    Cognition Arousal/Alertness: Awake/alert Behavior During Therapy: WFL for tasks assessed/performed Overall Cognitive Status: History of cognitive impairments - at baseline                 General Comments: appropriate, follows commands    Exercises General Exercises - Lower Extremity Quad Sets: AROM;Strengthening;Both;10 reps Short Arc Quad: AROM;AAROM;Strengthening;Right;10 reps Heel Slides: AAROM;Strengthening;Both;10 reps Hip ABduction/ADduction: AAROM;AROM;Right    General Comments        Pertinent Vitals/Pain Pain Assessment:  Faces Faces Pain Scale: Hurts little more Pain Location: right hip Pain Descriptors / Indicators: Sore Pain Intervention(s): Monitored during session    Home Living                      Prior Function            PT Goals (current goals can now be found in the care plan section) Acute Rehab PT Goals Patient Stated Goal: get better PT Goal Formulation: With patient/family Time For Goal Achievement: 12/14/16 Progress towards PT goals: Progressing toward goals    Frequency    Min 3X/week      PT Plan Current plan remains appropriate    Co-evaluation             End of Session   Activity Tolerance: Patient tolerated treatment well;Patient limited by fatigue;Treatment limited secondary to medical complications (Comment) (OOB deferred d/t low BP) Patient left: in bed;with call bell/phone within reach;with family/visitor present;with bed alarm set     Time: 1256-1310 PT Time Calculation (min) (ACUTE ONLY): 14 min  Charges:                       G CodesDrucilla [name]:      Gabrielle [name] 12/09/2016, 1:30 PM

## 2016-12-09 NOTE — Care Management Important Message (Signed)
Important Message  Patient Details  Name: Gabrielle [name] MRN: [id number] Date of Birth: Dec 18, 1926   Medicare Important Message Given:  Yes    Caren MacadamFuller, Anysa Tacey 12/09/2016, 11:17 AMImportant Message  Patient Details  Name: Gabrielle [name] MRN: [id number] Date of Birth: Dec 18, 1926   Medicare Important Message Given:  Yes    Caren MacadamFuller, Ciarah Peace 12/09/2016, 11:17 AM

## 2016-12-09 NOTE — Discharge Summary (Signed)
Physician Discharge Summary  Gabrielle [name] WUJ:[id number] DOB: 1927-05-26 DOA: 12/05/2016  PCP: Gweneth Dimitri, MD  Admit date: 12/05/2016 Discharge date: 12/10/2016  Time spent: 35 minutes  Recommendations for Outpatient Follow-up:  1. Repeat CBC in 5 days to follow Hgb trend 2. Repeat BMET to follow electrolytes and renal function   Discharge Diagnoses:  Principal Problem:   Closed fracture of right hip (HCC) Active Problems:   Pure hypercholesterolemia   Depression   Dizziness   Dementia   Fall   Cough   Influenza A   SOB (shortness of breath)   Discharge Condition: stable. Discharge to SNF for rehabilitation and further care.  Diet recommendation: regular diet   Filed Weights   12/05/16 1712  Weight: 71.7 kg (158 lb)    History of present illness:  83 y.o.femalewith medical history significant of dementia, hyperlipidemia, anxiety, mitral valve prolapse, aortic valve insufficiency, glaucoma, arthritis, using walker, who presents with fall and right hip injury.  Per pt's son, pt has been mildly sick in the past several days. She has mild dry cough and mild running nose. She also has increased urinary frequency, but no dysuria or burning on urination. No chest pain or shortness of breath. She has nausea, but no vomiting, abdominal pain or diarrhea.  Pt states that she got dizzy in this afternoon, fell when she tried to reach some thing from, injured her right hip, caused severe pain. She did not have unilateral weakness, numbness in extremities, no vision change or hearing loss. Per her son, patient did not have LOC. She denies any numbness of her right leg.Please righthip pain is constant, sharp, 8 out of 10 in severity, nonradiating. Patient underwent left hip arthroplasty in 2017 with Dr. Ophelia Charter.  Hospital Course:  Closed right hip fracture, initial encounter(HCC): As evidenced by x-ray. Patient has no signs for neurovascular compromise.  -status post right  hemiarthroplasty by Dr. Ophelia Charter on 1/27 -follow post operative rec's from orthopedic service instructions -continue PRN analgesics (but will be careful with given soft BP) -following PT/OT assessment will arrange discharge to SNF for rehab -follow Hgb and electrolytes in 5 days -lovenox for DVT prophylaxis   Fall and dizziness: Likely due to multifactorial etiology, including upper respiratory viral infection (Flu), deconditioning, ongoing dementia and dehydration. No focal neurological findings, less likely to have stroke. CT of the head and C-spine negative for acute abnormalities. -will encourage increase PO intake -treatment for Flu as mentioned below  -continue supportive care  Influenza A with respiratory symptoms -will continue tamiflu and supportive care -cough and rhonchi, with some needs for O2 suppleemntaiton (continue weaning as tolerated) -will continue PRN duoneb -no signs of PNA appreciated on repeat CXR  HLD: -Continue lipitor  Dementia: -patient somnolent, but oriented X 2. No agitation; answering questions properly -Continue Namenda and donepezil -poor insight demonstrated   Depression:Stable, no suicidal or homicidal ideations. -Continue Celexa  Procedures: Right hip hemiarthroplasty for femoral neck fracture 1/27.  Consultations:  Orthopedic service   Discharge Exam: Vitals:   12/09/16 1222 12/09/16 1458  BP: (!) 102/50 (!) 104/46  Pulse:  (!) 59  Resp:  16  Temp:  97.4 F (36.3 C)    General:  Afebrile, able to follow commands properly and reporting pain to be mild at this time. Patient is status post Right hip hemiarthroplasty for femoral neck fracture on 1/27. Patient oriented X2, more alert today. She is answering questions properly.   Cardiovascular: S1 and s2, no rubs, no gallops, positive murmur  Respiratory: scattered rhonchi, no crackles; mild exp wheezing. Patient not requiring accessory muscles   Abdomen: soft, NT, ND, positive  BS  Musculoskeletal: no edema, no cyanosis. Right hip with clean and intact dressings    Discharge Instructions   Discharge Instructions    Discharge instructions    Complete by:  As directed    Maintain adequate hydration  Follow up with orthopedic service as instructed  Repeat BMET in 5 days to follow electrolytes and renal function  Repeat CBC in 5 days to follow Hgb trend     Current Discharge Medication List    START taking these medications   Details  albuterol (PROVENTIL) (2.5 MG/3ML) 0.083% nebulizer solution Take 3 mLs (2.5 mg total) by nebulization every 6 (six) hours as needed for wheezing or shortness of breath.    dextromethorphan-guaiFENesin (MUCINEX DM) 30-600 MG 12hr tablet Take 1 tablet by mouth 2 (two) times daily.    feeding supplement, ENSURE ENLIVE, (ENSURE ENLIVE) LIQD Take 237 mLs by mouth 2 (two) times daily between meals.    oseltamivir (TAMIFLU) 30 MG capsule Take 1 capsule (30 mg total) by mouth 2 (two) times daily. Qty: 2 capsule, Refills: 0      CONTINUE these medications which have CHANGED   Details  enoxaparin (LOVENOX) 30 MG/0.3ML injection Inject 0.3 mLs (30 mg total) into the skin daily. Qty: 28 Syringe, Refills: 0    HYDROcodone-acetaminophen (NORCO) 5-325 MG tablet Take 1 tablet by mouth every 6 (six) hours as needed for moderate pain. Qty: 60 tablet, Refills: 0    senna-docusate (SENOKOT-S) 8.6-50 MG tablet Take 1 tablet by mouth at bedtime as needed for mild constipation.      CONTINUE these medications which have NOT CHANGED   Details  atorvastatin (LIPITOR) 20 MG tablet Take 20 mg by mouth at bedtime.     Calcium Carb-Cholecalciferol (CALCIUM 600 + D PO) Take 1 tablet by mouth 2 (two) times daily.    Cholecalciferol (VITAMIN D) 2000 UNITS CAPS Take 4,000 Units by mouth at bedtime.     citalopram (CELEXA) 40 MG tablet Take 40 mg by mouth at bedtime.     donepezil (ARICEPT) 10 MG tablet Take 10 mg by mouth at bedtime.      memantine (NAMENDA) 5 MG tablet Take 5 mg by mouth at bedtime.    Multiple Vitamins-Minerals (ICAPS MV) TABS Take 1 tablet by mouth at bedtime.     Omega-3 Fatty Acids (FISH OIL) 1000 MG CAPS Take 1,000 mg by mouth at bedtime.     timolol (BETIMOL) 0.25 % ophthalmic solution Place 1 drop into both eyes 2 (two) times daily.     bisacodyl (DULCOLAX) 5 MG EC tablet Take 1 tablet (5 mg total) by mouth daily as needed for moderate constipation. Qty: 30 tablet, Refills: 0      STOP taking these medications     aspirin 81 MG tablet        No Known Allergies Follow-up Information    MCNEILL,[name], MD Follow up.   Specialty:  Family Medicine Contact information: [address] [place] [address] [phone]        Eldred Manges, MD. Schedule an appointment as soon as possible for a visit today.   Specialty:  Orthopedic Surgery Why:  must have follow up appointment with Dr Ophelia Charter 2 weeks postop.  Contact information: [address] [place] [address] [phone]           The results of significant  diagnostics from this hospitalization (including imaging, microbiology, ancillary and laboratory) are listed below for reference.    Significant Diagnostic Studies: Dg Chest 1 View  Result Date: 12/05/2016 CLINICAL DATA:  Preoperative chest radiograph, for hip fracture. Cough. Initial encounter. EXAM: CHEST 1 VIEW COMPARISON:  Chest radiograph performed 08/30/2016 FINDINGS: The lungs are well-aerated and clear. There is no evidence of focal opacification, pleural effusion or pneumothorax. The cardiomediastinal silhouette is borderline normal in size. No acute osseous abnormalities are seen. Right convex thoracic scoliosis is noted. IMPRESSION: No acute cardiopulmonary process seen. Right convex thoracic scoliosis noted. No displaced rib fractures identified. Electronically Signed   By: Roanna RaiderJeffery  Chang M.D.   On: 12/05/2016 19:13   Ct Head Wo Contrast  Result  Date: 12/05/2016 CLINICAL DATA:  Dizziness with subsequent fall. EXAM: CT HEAD WITHOUT CONTRAST CT CERVICAL SPINE WITHOUT CONTRAST TECHNIQUE: Multidetector CT imaging of the head and cervical spine was performed following the standard protocol without intravenous contrast. Multiplanar CT image reconstructions of the cervical spine were also generated. COMPARISON:  08/03/2015 FINDINGS: CT HEAD FINDINGS Brain: Ventricles and cisterns are within normal. There is mild age related atrophic change. There is no mass, mass effect, shift of midline structures or acute hemorrhage. No evidence of acute infarction. Minimal chronic ischemic microvascular disease. Vascular: Mild plaque over the cavernous segment of the internal carotid arteries. Skull: Within normal. Sinuses/Orbits: Orbits are within normal. Paranasal sinuses are well developed and well aerated without air-fluid levels or significant opacification. Other: None. CT CERVICAL SPINE FINDINGS Alignment:  Within normal. Skull base and vertebrae: Vertebral body alignment and heights are within normal. There is mild spondylosis throughout the cervical spine. There is moderate uncovertebral joint spurring and mild facet arthropathy. No acute fracture. Soft tissues and spinal canal: Prevertebral soft tissues are within normal. Spinal canal is within normal. Disc levels: Mild disc space narrowing at the C3-4, C4-5, C5-6 and C6-7 levels. Upper chest: Within normal. Other: None. IMPRESSION: No acute intracranial findings. Minimal chronic ischemic microvascular disease. No acute cervical spine injury. Mild spondylosis throughout the cervical spine with mild multilevel disc disease. Electronically Signed   By: Elberta Fortisaniel  Boyle M.D.   On: 12/05/2016 19:02   Ct Cervical Spine Wo Contrast  Result Date: 12/05/2016 CLINICAL DATA:  Dizziness with subsequent fall. EXAM: CT HEAD WITHOUT CONTRAST CT CERVICAL SPINE WITHOUT CONTRAST TECHNIQUE: Multidetector CT imaging of the head and  cervical spine was performed following the standard protocol without intravenous contrast. Multiplanar CT image reconstructions of the cervical spine were also generated. COMPARISON:  08/03/2015 FINDINGS: CT HEAD FINDINGS Brain: Ventricles and cisterns are within normal. There is mild age related atrophic change. There is no mass, mass effect, shift of midline structures or acute hemorrhage. No evidence of acute infarction. Minimal chronic ischemic microvascular disease. Vascular: Mild plaque over the cavernous segment of the internal carotid arteries. Skull: Within normal. Sinuses/Orbits: Orbits are within normal. Paranasal sinuses are well developed and well aerated without air-fluid levels or significant opacification. Other: None. CT CERVICAL SPINE FINDINGS Alignment:  Within normal. Skull base and vertebrae: Vertebral body alignment and heights are within normal. There is mild spondylosis throughout the cervical spine. There is moderate uncovertebral joint spurring and mild facet arthropathy. No acute fracture. Soft tissues and spinal canal: Prevertebral soft tissues are within normal. Spinal canal is within normal. Disc levels: Mild disc space narrowing at the C3-4, C4-5, C5-6 and C6-7 levels. Upper chest: Within normal. Other: None. IMPRESSION: No acute intracranial findings. Minimal chronic ischemic microvascular disease.  No acute cervical spine injury. Mild spondylosis throughout the cervical spine with mild multilevel disc disease. Electronically Signed   By: Elberta Fortis M.D.   On: 12/05/2016 19:02   Pelvis Portable  Result Date: 12/06/2016 CLINICAL DATA:  Status post right hip arthroplasty. EXAM: PORTABLE PELVIS 1-2 VIEWS COMPARISON:  08/31/2016 FINDINGS: New right hip hemiarthroplasty appears well-seated and aligned. Left hemiarthroplasty is stable, also well-seated and aligned. No acute fracture or evidence of an operative complication. IMPRESSION: Well-positioned right hip hemiarthroplasty.  Electronically Signed   By: Amie Portland M.D.   On: 12/06/2016 09:50   Dg Chest Port 1 View  Result Date: 12/09/2016 CLINICAL DATA:  Shortness of breath, cough EXAM: PORTABLE CHEST 1 VIEW COMPARISON:  12/05/2016 FINDINGS: There is hyperinflation of the lungs compatible with COPD. Left lower lobe atelectasis. Right lung is clear. No effusions. Heart is mildly enlarged. Tortuosity of the thoracic aorta with calcifications. Severe thoracolumbar scoliosis. IMPRESSION: COPD.  Left base atelectasis. Electronically Signed   By: Charlett Nose M.D.   On: 12/09/2016 12:41   Dg Hip Unilat  With Pelvis 2-3 Views Right  Result Date: 12/05/2016 CLINICAL DATA:  Acute onset of syncope and fall. Right hip pain. Initial encounter. EXAM: DG HIP (WITH OR WITHOUT PELVIS) 2-3V RIGHT COMPARISON:  None. FINDINGS: There is a mildly displaced subcapital fracture through the right femoral neck, with superior displacement of the distal femur. The right femoral head remains seated at the acetabulum. The left hip arthroplasty is grossly unremarkable. Mild degenerative change is noted at the lower lumbar spine. The sacroiliac joints are unremarkable in appearance. The visualized bowel gas pattern is grossly unremarkable in appearance. IMPRESSION: Mildly displaced subcapital fracture through the right femoral neck, with superior displacement of the distal femur. Electronically Signed   By: Roanna Raider M.D.   On: 12/05/2016 17:58    Microbiology: Recent Results (from the past 240 hour(s))  MRSA PCR Screening     Status: None   Collection Time: 12/06/16  3:34 AM  Result Value Ref Range Status   MRSA by PCR NEGATIVE NEGATIVE Final    Comment:        The GeneXpert MRSA Assay (FDA approved for NASAL specimens only), is one component of a comprehensive MRSA colonization surveillance program. It is not intended to diagnose MRSA infection nor to guide or monitor treatment for MRSA infections.      Labs: Basic Metabolic  Panel:  Recent Labs Lab 12/05/16 1822 12/06/16 0435 12/07/16 0435 12/08/16 0416  NA 132* 134* 134* 133*  K 4.9 3.8 3.6 3.6  CL 96* 98* 98* 97*  CO2 28 31 31 31   GLUCOSE 95 88 87 100*  BUN 14 13 10 9   CREATININE 0.89 0.74 0.57 0.65  CALCIUM 8.3* 8.4* 7.8* 8.2*   Liver Function Tests:  Recent Labs Lab 12/05/16 1822  AST 82*  ALT 42  ALKPHOS 46  BILITOT 1.2  PROT 6.9  ALBUMIN 3.8   CBC:  Recent Labs Lab 12/05/16 1822 12/06/16 0435 12/07/16 0435 12/08/16 0416 12/09/16 0419  WBC 5.8 6.0 6.9 7.6 8.8  NEUTROABS 4.0  --   --   --   --   HGB 12.8 11.9* 10.5* 9.9* 9.4*  HCT 38.9 36.3 31.7* 29.4* 28.5*  MCV 88.8 91.2 90.6 89.1 89.6  PLT 176 143* 128* 118* 123*     Signed:  Vassie Loll MD.  [facility] 12/09/2016, 3:22 PM

## 2016-12-10 ENCOUNTER — Encounter (HOSPITAL_COMMUNITY): Payer: Self-pay | Admitting: *Deleted

## 2016-12-10 ENCOUNTER — Emergency Department (HOSPITAL_COMMUNITY)
Admission: EM | Admit: 2016-12-10 | Discharge: 2016-12-11 | Disposition: A | Discharge status: Home / Self Care | Payer: Medicare Other | Attending: Emergency Medicine | Admitting: Emergency Medicine

## 2016-12-10 DIAGNOSIS — Z9181 History of falling: Secondary | ICD-10-CM | POA: Diagnosis not present

## 2016-12-10 DIAGNOSIS — Y831 Surgical operation with implant of artificial internal device as the cause of abnormal reaction of the patient, or of later complication, without mention of misadventure at the time of the procedure: Secondary | ICD-10-CM | POA: Diagnosis present

## 2016-12-10 DIAGNOSIS — E785 Hyperlipidemia, unspecified: Secondary | ICD-10-CM | POA: Diagnosis not present

## 2016-12-10 DIAGNOSIS — J989 Respiratory disorder, unspecified: Secondary | ICD-10-CM | POA: Diagnosis not present

## 2016-12-10 DIAGNOSIS — M00051 Staphylococcal arthritis, right hip: Secondary | ICD-10-CM | POA: Diagnosis not present

## 2016-12-10 DIAGNOSIS — D649 Anemia, unspecified: Secondary | ICD-10-CM | POA: Diagnosis not present

## 2016-12-10 DIAGNOSIS — E7800 Pure hypercholesterolemia, unspecified: Secondary | ICD-10-CM | POA: Diagnosis not present

## 2016-12-10 DIAGNOSIS — Z01818 Encounter for other preprocedural examination: Secondary | ICD-10-CM | POA: Diagnosis not present

## 2016-12-10 DIAGNOSIS — R278 Other lack of coordination: Secondary | ICD-10-CM | POA: Diagnosis not present

## 2016-12-10 DIAGNOSIS — B9561 Methicillin susceptible Staphylococcus aureus infection as the cause of diseases classified elsewhere: Secondary | ICD-10-CM | POA: Diagnosis not present

## 2016-12-10 DIAGNOSIS — R404 Transient alteration of awareness: Secondary | ICD-10-CM | POA: Diagnosis not present

## 2016-12-10 DIAGNOSIS — R05 Cough: Secondary | ICD-10-CM | POA: Diagnosis not present

## 2016-12-10 DIAGNOSIS — Z7901 Long term (current) use of anticoagulants: Secondary | ICD-10-CM | POA: Insufficient documentation

## 2016-12-10 DIAGNOSIS — D62 Acute posthemorrhagic anemia: Secondary | ICD-10-CM | POA: Diagnosis not present

## 2016-12-10 DIAGNOSIS — R2689 Other abnormalities of gait and mobility: Secondary | ICD-10-CM | POA: Diagnosis not present

## 2016-12-10 DIAGNOSIS — N390 Urinary tract infection, site not specified: Secondary | ICD-10-CM | POA: Diagnosis not present

## 2016-12-10 DIAGNOSIS — Z96651 Presence of right artificial knee joint: Secondary | ICD-10-CM | POA: Insufficient documentation

## 2016-12-10 DIAGNOSIS — Z96643 Presence of artificial hip joint, bilateral: Secondary | ICD-10-CM | POA: Diagnosis present

## 2016-12-10 DIAGNOSIS — J1108 Influenza due to unidentified influenza virus with specified pneumonia: Secondary | ICD-10-CM | POA: Diagnosis not present

## 2016-12-10 DIAGNOSIS — Y792 Prosthetic and other implants, materials and accessory orthopedic devices associated with adverse incidents: Secondary | ICD-10-CM | POA: Diagnosis not present

## 2016-12-10 DIAGNOSIS — R41841 Cognitive communication deficit: Secondary | ICD-10-CM | POA: Diagnosis not present

## 2016-12-10 DIAGNOSIS — K5900 Constipation, unspecified: Secondary | ICD-10-CM | POA: Diagnosis not present

## 2016-12-10 DIAGNOSIS — F0281 Dementia in other diseases classified elsewhere with behavioral disturbance: Secondary | ICD-10-CM | POA: Diagnosis present

## 2016-12-10 DIAGNOSIS — T814XXA Infection following a procedure, initial encounter: Secondary | ICD-10-CM | POA: Diagnosis not present

## 2016-12-10 DIAGNOSIS — Z471 Aftercare following joint replacement surgery: Secondary | ICD-10-CM | POA: Diagnosis not present

## 2016-12-10 DIAGNOSIS — Z79899 Other long term (current) drug therapy: Secondary | ICD-10-CM | POA: Insufficient documentation

## 2016-12-10 DIAGNOSIS — S728X9A Other fracture of unspecified femur, initial encounter for closed fracture: Secondary | ICD-10-CM | POA: Diagnosis not present

## 2016-12-10 DIAGNOSIS — F0150 Vascular dementia without behavioral disturbance: Secondary | ICD-10-CM | POA: Diagnosis not present

## 2016-12-10 DIAGNOSIS — F325 Major depressive disorder, single episode, in full remission: Secondary | ICD-10-CM | POA: Diagnosis not present

## 2016-12-10 DIAGNOSIS — F0391 Unspecified dementia with behavioral disturbance: Secondary | ICD-10-CM | POA: Diagnosis not present

## 2016-12-10 DIAGNOSIS — H409 Unspecified glaucoma: Secondary | ICD-10-CM | POA: Diagnosis present

## 2016-12-10 DIAGNOSIS — Z87891 Personal history of nicotine dependence: Secondary | ICD-10-CM | POA: Diagnosis not present

## 2016-12-10 DIAGNOSIS — Z96641 Presence of right artificial hip joint: Secondary | ICD-10-CM | POA: Diagnosis not present

## 2016-12-10 DIAGNOSIS — S72001A Fracture of unspecified part of neck of right femur, initial encounter for closed fracture: Secondary | ICD-10-CM | POA: Diagnosis not present

## 2016-12-10 DIAGNOSIS — G309 Alzheimer's disease, unspecified: Secondary | ICD-10-CM | POA: Diagnosis present

## 2016-12-10 DIAGNOSIS — R0981 Nasal congestion: Secondary | ICD-10-CM | POA: Diagnosis not present

## 2016-12-10 DIAGNOSIS — M150 Primary generalized (osteo)arthritis: Secondary | ICD-10-CM | POA: Diagnosis not present

## 2016-12-10 DIAGNOSIS — J111 Influenza due to unidentified influenza virus with other respiratory manifestations: Secondary | ICD-10-CM | POA: Diagnosis not present

## 2016-12-10 DIAGNOSIS — S79911A Unspecified injury of right hip, initial encounter: Secondary | ICD-10-CM | POA: Diagnosis not present

## 2016-12-10 DIAGNOSIS — R531 Weakness: Secondary | ICD-10-CM | POA: Diagnosis not present

## 2016-12-10 DIAGNOSIS — R42 Dizziness and giddiness: Secondary | ICD-10-CM | POA: Diagnosis not present

## 2016-12-10 DIAGNOSIS — Z90710 Acquired absence of both cervix and uterus: Secondary | ICD-10-CM | POA: Diagnosis not present

## 2016-12-10 DIAGNOSIS — T8451XA Infection and inflammatory reaction due to internal right hip prosthesis, initial encounter: Secondary | ICD-10-CM | POA: Diagnosis not present

## 2016-12-10 DIAGNOSIS — M6281 Muscle weakness (generalized): Secondary | ICD-10-CM | POA: Diagnosis not present

## 2016-12-10 DIAGNOSIS — F329 Major depressive disorder, single episode, unspecified: Secondary | ICD-10-CM | POA: Diagnosis not present

## 2016-12-10 NOTE — Discharge Instructions (Signed)
Ms. Gabrielle [name] has completed 10 doses (5 days) of Tamiflu while in the hospital. Her last dose was this morning. She no longer needs to take tamiflu. Can use 2L nasal cannula as needed.

## 2016-12-10 NOTE — ED Notes (Signed)
sats below 90%  02 2 liters added

## 2016-12-10 NOTE — ED Provider Notes (Signed)
[facility]-EMERGENCY DEPT Provider Note   CSN: [id number] Arrival date & time: 12/10/16  1730     History   Chief Complaint Chief Complaint  Patient presents with  . Influenza    HPI Gabrielle [name] is a 83 y.o. female.  HPI 83 year old female sent from her SNF for being flu positive on Tamiflu. She was recently admitted for a hip surgery after a fall and was found to be flu positive on June 26 and was given Tamiflu during her hospital stay. She states that she has had cough, congestion, runny nose. Denies any fevers. She was discharged today however since the discharge summary stated that she needed to continue taking Tamiflu the nursing facility would not take her back as it is their protocol to not except anyone who has flu positive currently taking Tamiflu. She states that she feels exactly the same as when she left this morning and she is not sure Why she is here.  Past Medical History:  Diagnosis Date  . Arthritis    oa  . Glaucoma 08/04/2015  . Hyperlipidemia   . Mild aortic insufficiency: Per 2 d echo 08/04/2015 08/04/2015  . Moderate mitral regurgitation: Per 2 d echo 08/04/2015 08/04/2015    Patient Active Problem List   Diagnosis Date Noted  . SOB (shortness of breath)   . Influenza A 12/06/2016  . Closed fracture of right hip (HCC) 12/05/2016  . Dementia 12/05/2016  . Fall 12/05/2016  . Cough 12/05/2016  . S/P hip hemiarthroplasty 09/16/2016  . Mallet finger, left small finger 09/16/2016  . Hip fracture (HCC) 08/30/2016  . Vascular dementia 08/30/2016  . Osteoporosis 08/30/2016  . Vaginal candida 08/30/2016  . UTI (urinary tract infection) 08/30/2016  . Mild aortic insufficiency: Per 2 d echo 08/04/2015 08/04/2015  . Moderate mitral regurgitation: Per 2 d echo 08/04/2015 08/04/2015  . Glaucoma 08/04/2015  . Dizziness 08/04/2015  . Syncope 08/03/2015  . Depression 12/09/2013  . Constipation 11/30/2013  . Pure hypercholesterolemia 11/30/2013  . Acute  posthemorrhagic anemia 11/30/2013  . OA (osteoarthritis) of knee 11/21/2013    Past Surgical History:  Procedure Laterality Date  . ABDOMINAL HYSTERECTOMY    . benign breast tumor removed  age 81  . HIP ARTHROPLASTY Left 08/31/2016   Procedure: ARTHROPLASTY  HIP (HEMIARTHROPLASTY);  Surgeon: Eldred Manges, MD;  Location: [facility] OR;  Service: Orthopedics;  Laterality: Left;  . HIP ARTHROPLASTY Right 12/06/2016   Procedure: ARTHROPLASTY BIPOLAR HIP (HEMIARTHROPLASTY);  Surgeon: Eldred Manges, MD;  Location: [facility] ORS;  Service: Orthopedics;  Laterality: Right;  . TONSILLECTOMY  age 89  . TOTAL KNEE ARTHROPLASTY Right 11/21/2013   Procedure: RIGHT TOTAL KNEE ARTHROPLASTY;  Surgeon: Loanne Drilling, MD;  Location: [facility] ORS;  Service: Orthopedics;  Laterality: Right;  . WRIST FRACTURE SURGERY Right     OB History    No data available       Home Medications    Prior to Admission medications   Medication Sig Start Date End Date Taking? Authorizing Provider  albuterol (PROVENTIL) (2.5 MG/3ML) 0.083% nebulizer solution Take 3 mLs (2.5 mg total) by nebulization every 6 (six) hours as needed for wheezing or shortness of breath. 12/09/16   Vassie Loll, MD  atorvastatin (LIPITOR) 20 MG tablet Take 20 mg by mouth at bedtime.     Historical Provider, MD  bisacodyl (DULCOLAX) 5 MG EC tablet Take 1 tablet (5 mg total) by mouth daily as needed for moderate constipation. Patient not taking: Reported on 12/05/2016  09/02/16   Lenox PondsEdwin Silva Zapata, MD  Calcium Carb-Cholecalciferol (CALCIUM 600 + D PO) Take 1 tablet by mouth 2 (two) times daily.    Historical Provider, MD  Cholecalciferol (VITAMIN D) 2000 UNITS CAPS Take 4,000 Units by mouth at bedtime.     Historical Provider, MD  citalopram (CELEXA) 40 MG tablet Take 40 mg by mouth at bedtime.     Historical Provider, MD  dextromethorphan-guaiFENesin (MUCINEX DM) 30-600 MG 12hr tablet Take 1 tablet by mouth 2 (two) times daily. 12/09/16   Vassie Lollarlos Madera, MD  donepezil  (ARICEPT) 10 MG tablet Take 10 mg by mouth at bedtime.  06/19/15   Historical Provider, MD  enoxaparin (LOVENOX) 30 MG/0.3ML injection Inject 0.3 mLs (30 mg total) into the skin daily. 12/09/16   Naida SleightJames M Owens, PA-C  feeding supplement, ENSURE ENLIVE, (ENSURE ENLIVE) LIQD Take 237 mLs by mouth 2 (two) times daily between meals. 12/10/16   Vassie Lollarlos Madera, MD  HYDROcodone-acetaminophen (NORCO) 5-325 MG tablet Take 1 tablet by mouth every 6 (six) hours as needed for moderate pain. 12/08/16   Naida SleightJames M Owens, PA-C  memantine (NAMENDA) 5 MG tablet Take 5 mg by mouth at bedtime. 08/06/16   Historical Provider, MD  Multiple Vitamins-Minerals (ICAPS MV) TABS Take 1 tablet by mouth at bedtime.     Historical Provider, MD  Omega-3 Fatty Acids (FISH OIL) 1000 MG CAPS Take 1,000 mg by mouth at bedtime.     Historical Provider, MD  senna-docusate (SENOKOT-S) 8.6-50 MG tablet Take 1 tablet by mouth at bedtime as needed for mild constipation. 12/09/16   Vassie Lollarlos Madera, MD  timolol (BETIMOL) 0.25 % ophthalmic solution Place 1 drop into both eyes 2 (two) times daily.     Historical Provider, MD    Family History No family history on file.  Social History Social History  Substance Use Topics  . Smoking status: Former Smoker    Packs/day: 1.00    Years: 17.00    Types: Cigarettes    Quit date: 11/10/1965  . Smokeless tobacco: Never Used  . Alcohol use Yes     Comment: very occasional wine     Allergies   Patient has no known allergies.   Review of Systems Review of Systems  Constitutional: Positive for chills. Negative for fever.  HENT: Positive for congestion. Negative for ear pain and sore throat.   Eyes: Negative for pain and visual disturbance.  Respiratory: Positive for cough. Negative for shortness of breath.   Cardiovascular: Negative for chest pain and palpitations.  Gastrointestinal: Negative for abdominal pain, nausea and vomiting.  Genitourinary: Negative for dysuria and hematuria.    Musculoskeletal: Positive for myalgias. Negative for arthralgias and back pain.  Skin: Negative for color change and rash.  Neurological: Negative for seizures and syncope.  All other systems reviewed and are negative.    Physical Exam Updated Vital Signs BP 133/86 (BP Location: Right Arm)   Pulse (!) 59   Temp 98.2 F (36.8 C)   Resp 22   SpO2 99%   Physical Exam  Constitutional: She appears well-developed and well-nourished. No distress.  HENT:  Head: Normocephalic and atraumatic.  Eyes: Conjunctivae are normal.  Neck: Normal range of motion. Neck supple.  Cardiovascular: Normal rate and regular rhythm.   No murmur heard. Pulmonary/Chest: Effort normal. No tachypnea. No respiratory distress. She has rhonchi in the right middle field, the right lower field, the left middle field and the left lower field.  Abdominal: Soft. She exhibits no distension.  There is no tenderness.  Musculoskeletal: She exhibits no edema.  Neurological: She is alert.  Skin: Skin is warm and dry.  Psychiatric: She has a normal mood and affect.  Nursing note and vitals reviewed.    ED Treatments / Results  Labs (all labs ordered are listed, but only abnormal results are displayed) Labs Reviewed - No data to display  EKG  EKG Interpretation None       Radiology Dg Chest [facility] 1 View  Result Date: 12/09/2016 CLINICAL DATA:  Shortness of breath, cough EXAM: PORTABLE CHEST 1 VIEW COMPARISON:  12/05/2016 FINDINGS: There is hyperinflation of the lungs compatible with COPD. Left lower lobe atelectasis. Right lung is clear. No effusions. Heart is mildly enlarged. Tortuosity of the thoracic aorta with calcifications. Severe thoracolumbar scoliosis. IMPRESSION: COPD.  Left base atelectasis. Electronically Signed   By: Charlett Nose M.D.   On: 12/09/2016 12:41    Procedures Procedures (including critical care time)  Medications Ordered in ED Medications - No data to display   Initial Impression  / Assessment and Plan / ED Course  I have reviewed the triage vital signs and the nursing notes.  Pertinent labs & imaging results that were available during my care of the patient were reviewed by me and considered in my medical decision making (see chart for details).    83 year old female recently admitted for a right hip arthroplasty who was discharged this morning however when she arrived at the nursing facility they would not accept her due to the discharge summary stating that she was to continue Tamiflu. Spoke with the nurse manager at the [facility] house and she states that it is against her protocol to accept someone who is fluid positive still taking Tamiflu. Upon review of the patient's chart she received ten full doses of Tamiflu over 5 days and has completed her course. The discharge summary must have been printed incorrectly on the medication list. As the patient has not had any change in her symptoms and wants to go home I spoke again with the nurse manager at the nursing facility and informed her that the patient had completed her Tamiflu. She states that since she has completed that she will be able to come back. Tamiflu was removed from her med list and she was discharged back to the SNF in good condition.  Patient care discussed and supervised by my attending, Dr. Madilyn Hook. Azalia Bilis, MD   Final Clinical Impressions(s) / ED Diagnoses   Final diagnoses:  Influenza    New Prescriptions Current Discharge Medication List       Cadie Sorci [place] Jayjay Littles, MD 12/10/16 2005    Tilden Fossa, MD 12/16/16 1005

## 2016-12-10 NOTE — ED Notes (Signed)
The pt had a fractured hip and had surgery jan 26   No pain with movement from the stretcher

## 2016-12-10 NOTE — ED Notes (Signed)
The pt  Will be going back by ptar

## 2016-12-10 NOTE — ED Notes (Signed)
Discharge delayed some confusion about whether the nursing home will take her back  A granddaughter is at the bedside

## 2016-12-10 NOTE — Progress Notes (Signed)
Subjective: Doing well.  Hip pain controlled.  Ready for transfer to SNF.     Objective: Vital signs in last 24 hours: Temp:  [97.4 F (36.3 C)-99.1 F (37.3 C)] 98.9 F (37.2 C) (01/31 0536) Pulse Rate:  [56-59] 59 (01/31 0536) Resp:  [15-16] 15 (01/31 0809) BP: (96-137)/(41-60) 127/53 (01/31 0536) SpO2:  [92 %-100 %] 93 % (01/31 0809)  Intake/Output from previous day: 01/30 0701 - 01/31 0700 In: 2294.1 [P.O.:1500; I.V.:794.1] Out: 0  Intake/Output this shift: No intake/output data recorded.   Recent Labs  12/08/16 0416 12/09/16 0419  HGB 9.9* 9.4*    Recent Labs  12/08/16 0416 12/09/16 0419  WBC 7.6 8.8  RBC 3.30* 3.18*  HCT 29.4* 28.5*  PLT 118* 123*    Recent Labs  12/08/16 0416  NA 133*  K 3.6  CL 97*  CO2 31  BUN 9  CREATININE 0.65  GLUCOSE 100*  CALCIUM 8.2*   No results for input(s): LABPT, INR in the last 72 hours.  Exam:  Very pleasant WF alert and oriented. NAD.  Right hip wound looks good.  No drainage or signs of infection.  bilat calves nontender.  NVI.    Assessment/Plan: Stable from ortho standpoint for transfer to SNF today.  Follow up with Dr Ophelia CharterYates 2 weeks postop.  Scripts on chart for Norco (pain) and lovenox (DVT prophylaxis).    Gabrielle [name] 12/10/2016, 8:47 AM

## 2016-12-10 NOTE — ED Notes (Signed)
The pts grandaughter is at bedside

## 2016-12-10 NOTE — ED Triage Notes (Signed)
The pt [facility]   She was just discharged from this hospital today earlier  She had a flu swab  Before she left and tested positive for the flu   The nursing home sent her back here because they reported to [facility] that the nursing home  Cannot take anyone that is pos for the flu untikl she has had treatment finished  That will be 3 days   Pt alert  C/o a cough  Dry cough  The pt is alert and oriented to who the president is    She does not know the day of the week or the month  And she thinks its 2017 or 2018

## 2016-12-10 NOTE — ED Notes (Addendum)
The pt is not sure why shes here.  She has an apartment  On [address] shes not sure  Why she is not there  I tried to explain  Not sure if I helped

## 2016-12-10 NOTE — Clinical Social Work Placement (Signed)
   CLINICAL SOCIAL WORK PLACEMENT  NOTE  Date:  12/10/2016  Patient Details  Name: Gabrielle [name] MRN: [id number] Date of Birth: September 11, 1927  Clinical Social Work is seeking post-discharge placement for this patient at the Skilled  Nursing Facility level of care (*CSW will initial, date and re-position this form in  chart as items are completed):  Yes   Patient/family provided with [place] Clinical Social Work Department's list of facilities offering this level of care within the geographic area requested by the patient (or if unable, by the patient's family).  Yes   Patient/family informed of their freedom to choose among providers that offer the needed level of care, that participate in Medicare, Medicaid or managed care program needed by the patient, have an available bed and are willing to accept the patient.  Yes   Patient/family informed of [facility]'s ownership interest in [facility] and [facility], as well as of the fact that they are under no obligation to receive care at these facilities.  PASRR submitted to EDS on       PASRR number received on       Existing PASRR number confirmed on 12/01/16     FL2 transmitted to all facilities in geographic area requested by pt/family on 12/08/16     FL2 transmitted to all facilities within larger geographic area on       Patient informed that his/her managed care company has contracts with or will negotiate with certain facilities, including the following:        Yes   Patient/family informed of bed offers received.  Patient chooses bed at  [facility]     Physician recommends and patient chooses bed at      Patient to be transferred to  [facility] on 12/10/16.  Patient to be transferred to facility by PTAR     Patient family notified on 12/10/16 of transfer.  Name of family member notified:  SON     PHYSICIAN       Additional Comment: Pt / son are in agreement with d[facility]. PTAR  transport is required. Son is aware that out of pocket costs may be associated with PTAR transport. D/C Summary sent to SNF for review. Scripts included in d/c packet. # for report provided to nsg.   _______________________________________________ Royetta AsalHaidinger, Lorna Strother Lee, LCSW  [phone]  12/10/2016, 12:06 PM

## 2016-12-10 NOTE — ED Notes (Signed)
Report has been called back to the nursing home  Pt will travel by ptar

## 2016-12-10 NOTE — Progress Notes (Signed)
Physical Therapy Treatment Patient Details Name: Gabrielle [name] MRN: [id number] DOB: September 23, 1927 Today's Date: 12/10/2016    History of Present Illness 83 y.o.femalewho presents with fall and right hip injury; xray=R femoral neck fx-->s/p right hip posterior hemiarthroplasty on 12/06/16 per Dr. Ophelia Charter;  medical history significant of dementia, hyperlipidemia, anxiety, mitral valve prolapse, aortic valve insufficiency, glaucoma, arthritis, L hip hemi 2017.     PT Comments    Pt progressing although continues to be limited by low BPs/orthostatsis; minimally symptomatic; BPs documented in flow sheet section--see for details; continue to recommend SNF  Follow Up Recommendations  SNF;Supervision/Assistance - 24 hour     Equipment Recommendations  None recommended by PT    Recommendations for Other Services       Precautions / Restrictions Precautions Precautions: Fall;Posterior Hip Required Braces or Orthoses: Knee Immobilizer - Right Restrictions Weight Bearing Restrictions: No RLE Weight Bearing: Weight bearing as tolerated    Mobility  Bed Mobility Overal bed mobility: Needs Assistance Bed Mobility: Supine to Sit     Supine to sit: Mod assist;+2 for physical assistance     General bed mobility comments: multi-modal cues for technique, self assist and hip precautions  Transfers Overall transfer level: Needs assistance Equipment used: Rolling walker (2 wheeled) Transfers: Sit to/from [employer] Transfers Sit to Stand: +2 physical assistance;Min assist;+2 safety/equipment Stand pivot transfers: Min assist;+2 physical assistance;+2 safety/equipment       General transfer comment: cues for hand placement, RLE position and overall safety  Ambulation/Gait             General Gait Details: unable d/t low BP   Stairs            Wheelchair Mobility    Modified Rankin (Stroke Patients Only)       Balance           Standing balance  support: Bilateral upper extremity supported Standing balance-Leahy Scale: Poor Standing balance comment: reliant on UEs and external assist                    Cognition Arousal/Alertness: Awake/alert Behavior During Therapy: WFL for tasks assessed/performed Overall Cognitive Status: History of cognitive impairments - at baseline                 General Comments: appropriate, follows commands    Exercises General Exercises - Lower Extremity Ankle Circles/Pumps: AROM;Both;10 reps Quad Sets: AROM;Strengthening;Both;10 reps Heel Slides: AAROM;Strengthening;Both;10 reps    General Comments        Pertinent Vitals/Pain Pain Assessment: Faces Faces Pain Scale: Hurts little more Pain Location: right hip Pain Descriptors / Indicators: Sore Pain Intervention(s): Limited activity within patient's tolerance;Monitored during session;Repositioned    Home Living                      Prior Function            PT Goals (current goals can now be found in the care plan section) Acute Rehab PT Goals Patient Stated Goal: get better PT Goal Formulation: With patient/family Time For Goal Achievement: 12/14/16 Progress towards PT goals: Progressing toward goals    Frequency    Min 3X/week      PT Plan Current plan remains appropriate    Co-evaluation             End of Session Equipment Utilized During Treatment: Gait belt Activity Tolerance: Patient tolerated treatment well;Patient limited by fatigue;Treatment limited secondary to medical complications (Comment)  Patient left: in chair;with call bell/phone within reach;with chair alarm set;with family/visitor present     Time: [id number] PT Time Calculation (min) (ACUTE ONLY): 20 min  Charges:  $Therapeutic Activity: 8-22 mins                    G Codes:      Antwoin Lackey 12/10/2016, 11:14 AM

## 2016-12-10 NOTE — Progress Notes (Addendum)
Patient seen and examined today. No complaints, denies chest pain, SOB, N/V/D, abdominal pain. Urinating well, pain controlled, tolerating diet. Still with cough but not productive.   BP (!) 127/53 (BP Location: Left Arm)   Pulse (!) 59   Temp 98.9 F (37.2 C) (Oral)   Resp 15   Ht 5\' 9"  (1.753 m)   Wt 71.7 kg (158 lb)   SpO2 93%   BMI 23.33 kg/m    Discharge summary completed by Dr. Gwenlyn PerkingMadera. Patient to discharge to SNF today.   Noralee StainJennifer Taquila Leys, DO [facility] [email or web address] Password [email or web address] 12/10/2016, 8:41 AM

## 2016-12-10 NOTE — Clinical Social Work Note (Signed)
PTAR arranged.   Medical Social Worker will sign off for now as social work intervention is no longer needed. Please consult us again if new need arises.  Derenda FennelBashira Lean Fayson, MSW [phone] 12/10/2016 1:45 PM

## 2016-12-10 NOTE — Progress Notes (Signed)
CSW [facility] who stated [facility] was sent back due to Tamiflu not completed. Patient was assigned to private room and [facility] is unsure if they will have privates rooms available once stable. CSW updated [facility].   Stacy GardnerErin Viva Gallaher, LCSWA Clinical Social Worker [phone]

## 2016-12-11 DIAGNOSIS — D649 Anemia, unspecified: Secondary | ICD-10-CM | POA: Diagnosis not present

## 2016-12-11 DIAGNOSIS — S72001A Fracture of unspecified part of neck of right femur, initial encounter for closed fracture: Secondary | ICD-10-CM | POA: Diagnosis not present

## 2016-12-11 DIAGNOSIS — F0391 Unspecified dementia with behavioral disturbance: Secondary | ICD-10-CM | POA: Diagnosis not present

## 2016-12-11 DIAGNOSIS — F325 Major depressive disorder, single episode, in full remission: Secondary | ICD-10-CM | POA: Diagnosis not present

## 2016-12-19 DIAGNOSIS — R05 Cough: Secondary | ICD-10-CM | POA: Diagnosis not present

## 2016-12-22 ENCOUNTER — Inpatient Hospital Stay (HOSPITAL_COMMUNITY): Payer: Medicare Other

## 2016-12-22 ENCOUNTER — Inpatient Hospital Stay (HOSPITAL_COMMUNITY): Payer: Medicare Other | Admitting: Certified Registered Nurse Anesthetist

## 2016-12-22 ENCOUNTER — Inpatient Hospital Stay (HOSPITAL_COMMUNITY)
Admission: AD | Admit: 2016-12-22 | Discharge: 2016-12-25 | DRG: 501 | Disposition: A | Discharge status: Skilled Nursing Facility | Payer: Medicare Other | Source: Ambulatory Visit | Attending: Orthopaedic Surgery | Admitting: Orthopaedic Surgery

## 2016-12-22 ENCOUNTER — Encounter (HOSPITAL_COMMUNITY): Payer: Self-pay | Admitting: Certified Registered Nurse Anesthetist

## 2016-12-22 ENCOUNTER — Encounter (INDEPENDENT_AMBULATORY_CARE_PROVIDER_SITE_OTHER): Payer: Self-pay | Admitting: Surgery

## 2016-12-22 ENCOUNTER — Ambulatory Visit (INDEPENDENT_AMBULATORY_CARE_PROVIDER_SITE_OTHER): Payer: Medicare Other | Admitting: Surgery

## 2016-12-22 ENCOUNTER — Ambulatory Visit (INDEPENDENT_AMBULATORY_CARE_PROVIDER_SITE_OTHER): Payer: Self-pay

## 2016-12-22 ENCOUNTER — Encounter (HOSPITAL_COMMUNITY)
Admission: AD | Disposition: A | Discharge status: Home / Self Care | Payer: Self-pay | Source: Ambulatory Visit | Attending: Orthopaedic Surgery

## 2016-12-22 VITALS — BP 88/56 | HR 71

## 2016-12-22 DIAGNOSIS — M00051 Staphylococcal arthritis, right hip: Secondary | ICD-10-CM | POA: Diagnosis not present

## 2016-12-22 DIAGNOSIS — K5900 Constipation, unspecified: Secondary | ICD-10-CM | POA: Diagnosis not present

## 2016-12-22 DIAGNOSIS — F02818 Dementia in other diseases classified elsewhere, unspecified severity, with other behavioral disturbance: Secondary | ICD-10-CM

## 2016-12-22 DIAGNOSIS — F0150 Vascular dementia without behavioral disturbance: Secondary | ICD-10-CM | POA: Diagnosis not present

## 2016-12-22 DIAGNOSIS — Z96649 Presence of unspecified artificial hip joint: Secondary | ICD-10-CM

## 2016-12-22 DIAGNOSIS — D62 Acute posthemorrhagic anemia: Secondary | ICD-10-CM | POA: Diagnosis not present

## 2016-12-22 DIAGNOSIS — G309 Alzheimer's disease, unspecified: Secondary | ICD-10-CM | POA: Diagnosis present

## 2016-12-22 DIAGNOSIS — Z9889 Other specified postprocedural states: Secondary | ICD-10-CM

## 2016-12-22 DIAGNOSIS — B9561 Methicillin susceptible Staphylococcus aureus infection as the cause of diseases classified elsewhere: Secondary | ICD-10-CM | POA: Diagnosis not present

## 2016-12-22 DIAGNOSIS — Z01818 Encounter for other preprocedural examination: Secondary | ICD-10-CM

## 2016-12-22 DIAGNOSIS — Y831 Surgical operation with implant of artificial internal device as the cause of abnormal reaction of the patient, or of later complication, without mention of misadventure at the time of the procedure: Secondary | ICD-10-CM | POA: Diagnosis present

## 2016-12-22 DIAGNOSIS — E785 Hyperlipidemia, unspecified: Secondary | ICD-10-CM | POA: Diagnosis present

## 2016-12-22 DIAGNOSIS — IMO0001 Reserved for inherently not codable concepts without codable children: Secondary | ICD-10-CM

## 2016-12-22 DIAGNOSIS — F3289 Other specified depressive episodes: Secondary | ICD-10-CM | POA: Diagnosis not present

## 2016-12-22 DIAGNOSIS — R05 Cough: Secondary | ICD-10-CM | POA: Diagnosis not present

## 2016-12-22 DIAGNOSIS — T8140XA Infection following a procedure, unspecified, initial encounter: Secondary | ICD-10-CM

## 2016-12-22 DIAGNOSIS — R52 Pain, unspecified: Secondary | ICD-10-CM | POA: Diagnosis not present

## 2016-12-22 DIAGNOSIS — R062 Wheezing: Secondary | ICD-10-CM | POA: Diagnosis not present

## 2016-12-22 DIAGNOSIS — H409 Unspecified glaucoma: Secondary | ICD-10-CM | POA: Diagnosis present

## 2016-12-22 DIAGNOSIS — Z471 Aftercare following joint replacement surgery: Secondary | ICD-10-CM | POA: Diagnosis not present

## 2016-12-22 DIAGNOSIS — A4901 Methicillin susceptible Staphylococcus aureus infection, unspecified site: Secondary | ICD-10-CM

## 2016-12-22 DIAGNOSIS — R41841 Cognitive communication deficit: Secondary | ICD-10-CM | POA: Diagnosis not present

## 2016-12-22 DIAGNOSIS — M6281 Muscle weakness (generalized): Secondary | ICD-10-CM | POA: Diagnosis not present

## 2016-12-22 DIAGNOSIS — Y792 Prosthetic and other implants, materials and accessory orthopedic devices associated with adverse incidents: Secondary | ICD-10-CM | POA: Diagnosis not present

## 2016-12-22 DIAGNOSIS — T8459XA Infection and inflammatory reaction due to other internal joint prosthesis, initial encounter: Secondary | ICD-10-CM

## 2016-12-22 DIAGNOSIS — Z96641 Presence of right artificial hip joint: Secondary | ICD-10-CM | POA: Diagnosis not present

## 2016-12-22 DIAGNOSIS — R278 Other lack of coordination: Secondary | ICD-10-CM | POA: Diagnosis not present

## 2016-12-22 DIAGNOSIS — T8451XA Infection and inflammatory reaction due to internal right hip prosthesis, initial encounter: Principal | ICD-10-CM | POA: Diagnosis present

## 2016-12-22 DIAGNOSIS — R2689 Other abnormalities of gait and mobility: Secondary | ICD-10-CM | POA: Diagnosis not present

## 2016-12-22 DIAGNOSIS — T814XXA Infection following a procedure, initial encounter: Secondary | ICD-10-CM

## 2016-12-22 DIAGNOSIS — T8149XA Infection following a procedure, other surgical site, initial encounter: Secondary | ICD-10-CM

## 2016-12-22 DIAGNOSIS — S72001D Fracture of unspecified part of neck of right femur, subsequent encounter for closed fracture with routine healing: Secondary | ICD-10-CM

## 2016-12-22 DIAGNOSIS — E569 Vitamin deficiency, unspecified: Secondary | ICD-10-CM | POA: Diagnosis not present

## 2016-12-22 DIAGNOSIS — T8451XD Infection and inflammatory reaction due to internal right hip prosthesis, subsequent encounter: Secondary | ICD-10-CM | POA: Diagnosis not present

## 2016-12-22 DIAGNOSIS — Z90710 Acquired absence of both cervix and uterus: Secondary | ICD-10-CM

## 2016-12-22 DIAGNOSIS — E7800 Pure hypercholesterolemia, unspecified: Secondary | ICD-10-CM | POA: Diagnosis not present

## 2016-12-22 DIAGNOSIS — F0281 Dementia in other diseases classified elsewhere with behavioral disturbance: Secondary | ICD-10-CM | POA: Diagnosis present

## 2016-12-22 DIAGNOSIS — Z96651 Presence of right artificial knee joint: Secondary | ICD-10-CM | POA: Diagnosis present

## 2016-12-22 DIAGNOSIS — Z96643 Presence of artificial hip joint, bilateral: Secondary | ICD-10-CM | POA: Diagnosis present

## 2016-12-22 DIAGNOSIS — N390 Urinary tract infection, site not specified: Secondary | ICD-10-CM | POA: Diagnosis not present

## 2016-12-22 DIAGNOSIS — F329 Major depressive disorder, single episode, unspecified: Secondary | ICD-10-CM | POA: Diagnosis not present

## 2016-12-22 DIAGNOSIS — Z87891 Personal history of nicotine dependence: Secondary | ICD-10-CM | POA: Diagnosis not present

## 2016-12-22 DIAGNOSIS — Z9181 History of falling: Secondary | ICD-10-CM | POA: Diagnosis not present

## 2016-12-22 DIAGNOSIS — R262 Difficulty in walking, not elsewhere classified: Secondary | ICD-10-CM | POA: Diagnosis not present

## 2016-12-22 DIAGNOSIS — S92153A Displaced avulsion fracture (chip fracture) of unspecified talus, initial encounter for closed fracture: Secondary | ICD-10-CM | POA: Diagnosis not present

## 2016-12-22 DIAGNOSIS — B999 Unspecified infectious disease: Secondary | ICD-10-CM | POA: Diagnosis not present

## 2016-12-22 HISTORY — PX: INCISION AND DRAINAGE HIP: SHX1801

## 2016-12-22 LAB — COMPREHENSIVE METABOLIC PANEL
ALBUMIN: 2.9 g/dL — AB (ref 3.5–5.0)
ALK PHOS: 64 U/L (ref 38–126)
ALT: 21 U/L (ref 14–54)
ANION GAP: 10 (ref 5–15)
AST: 20 U/L (ref 15–41)
BUN: 11 mg/dL (ref 6–20)
CALCIUM: 9.8 mg/dL (ref 8.9–10.3)
CO2: 29 mmol/L (ref 22–32)
Chloride: 97 mmol/L — ABNORMAL LOW (ref 101–111)
Creatinine, Ser: 0.77 mg/dL (ref 0.44–1.00)
GFR calc non Af Amer: >60 mL/min (ref >60)
GLUCOSE: 133 mg/dL — AB (ref 65–99)
POTASSIUM: 3.9 mmol/L (ref 3.5–5.1)
Sodium: 136 mmol/L (ref 135–145)
Total Bilirubin: 1 mg/dL (ref 0.3–1.2)
Total Protein: 7.1 g/dL (ref 6.5–8.1)

## 2016-12-22 LAB — URINALYSIS, ROUTINE W REFLEX MICROSCOPIC
Bilirubin Urine: NEGATIVE
Glucose, UA: NEGATIVE mg/dL
Hgb urine dipstick: NEGATIVE
KETONES UR: NEGATIVE mg/dL
Leukocytes, UA: NEGATIVE
NITRITE: NEGATIVE
PROTEIN: NEGATIVE mg/dL
Specific Gravity, Urine: 1.016 (ref 1.005–1.030)
pH: 6 (ref 5.0–8.0)

## 2016-12-22 LAB — CBC WITH DIFFERENTIAL/PLATELET
BASOS ABS: 0 10*3/uL (ref 0.0–0.1)
BASOS PCT: 0 %
Eosinophils Absolute: 0 10*3/uL (ref 0.0–0.7)
Eosinophils Relative: 0 %
HEMATOCRIT: 36.2 % (ref 36.0–46.0)
HEMOGLOBIN: 11.4 g/dL — AB (ref 12.0–15.0)
LYMPHS PCT: 5 %
Lymphs Abs: 0.8 10*3/uL (ref 0.7–4.0)
MCH: 29.3 pg (ref 26.0–34.0)
MCHC: 31.5 g/dL (ref 30.0–36.0)
MCV: 93.1 fL (ref 78.0–100.0)
MONO ABS: 1.1 10*3/uL — AB (ref 0.1–1.0)
MONOS PCT: 8 %
NEUTROS ABS: 12.6 10*3/uL — AB (ref 1.7–7.7)
NEUTROS PCT: 87 %
Platelets: 394 10*3/uL (ref 150–400)
RBC: 3.89 MIL/uL (ref 3.87–5.11)
RDW: 16 % — ABNORMAL HIGH (ref 11.5–15.5)
WBC: 14.5 10*3/uL — ABNORMAL HIGH (ref 4.0–10.5)

## 2016-12-22 LAB — PROTIME-INR
INR: 1.3
PROTHROMBIN TIME: 16.3 s — AB (ref 11.4–15.2)

## 2016-12-22 LAB — SURGICAL PCR SCREEN
MRSA, PCR: NEGATIVE
STAPHYLOCOCCUS AUREUS: POSITIVE — AB

## 2016-12-22 LAB — APTT: aPTT: 30 seconds (ref 24–36)

## 2016-12-22 LAB — SEDIMENTATION RATE: Sed Rate: 71 mm/hr — ABNORMAL HIGH (ref 0–22)

## 2016-12-22 SURGERY — IRRIGATION AND DEBRIDEMENT HIP
Anesthesia: General | Site: Hip | Laterality: Right

## 2016-12-22 MED ORDER — OMEGA-3-ACID ETHYL ESTERS 1 G PO CAPS
1.0000 g | ORAL_CAPSULE | Freq: Every day | ORAL | Status: DC
  Administered 2016-12-22 – 2016-12-25 (×4): 1 g via ORAL
  Filled 2016-12-22 (×4): qty 1

## 2016-12-22 MED ORDER — ATORVASTATIN CALCIUM 20 MG PO TABS
20.0000 mg | ORAL_TABLET | Freq: Every day | ORAL | Status: DC
  Administered 2016-12-22 – 2016-12-24 (×3): 20 mg via ORAL
  Filled 2016-12-22 (×3): qty 1

## 2016-12-22 MED ORDER — CITALOPRAM HYDROBROMIDE 20 MG PO TABS
20.0000 mg | ORAL_TABLET | Freq: Every day | ORAL | Status: DC
  Administered 2016-12-22 – 2016-12-24 (×3): 20 mg via ORAL
  Filled 2016-12-22 (×3): qty 1

## 2016-12-22 MED ORDER — MAGNESIUM CITRATE PO SOLN
1.0000 | Freq: Once | ORAL | Status: AC | PRN
  Administered 2016-12-25: 1 via ORAL
  Filled 2016-12-22: qty 296

## 2016-12-22 MED ORDER — CALCIUM CARBONATE-VITAMIN D 600-400 MG-UNIT PO TABS: 1.0000 | ORAL_TABLET | Freq: Two times a day (BID) | ORAL | Status: DC

## 2016-12-22 MED ORDER — OXYCODONE HCL 5 MG PO TABS: 5.0000 mg | ORAL_TABLET | Freq: Once | ORAL | Status: DC | PRN

## 2016-12-22 MED ORDER — ICAPS MV PO TABS: 1.0000 | ORAL_TABLET | Freq: Every day | ORAL | Status: DC

## 2016-12-22 MED ORDER — ASPIRIN EC 325 MG PO TBEC
325.0000 mg | DELAYED_RELEASE_TABLET | Freq: Every day | ORAL | Status: DC
Start: 2016-12-23 — End: 2016-12-25
  Administered 2016-12-23 – 2016-12-25 (×3): 325 mg via ORAL
  Filled 2016-12-22 (×3): qty 1

## 2016-12-22 MED ORDER — SODIUM CHLORIDE 0.9 % IV SOLN
INTRAVENOUS | Status: DC
  Administered 2016-12-22: 14:00:00 via INTRAVENOUS

## 2016-12-22 MED ORDER — ENSURE ENLIVE PO LIQD
237.0000 mL | Freq: Two times a day (BID) | ORAL | Status: DC
  Administered 2016-12-23 – 2016-12-25 (×5): 237 mL via ORAL

## 2016-12-22 MED ORDER — VITAMIN D 1000 UNITS PO TABS
4000.0000 [IU] | ORAL_TABLET | Freq: Every day | ORAL | Status: DC
  Administered 2016-12-22 – 2016-12-24 (×3): 4000 [IU] via ORAL
  Filled 2016-12-22 (×5): qty 4

## 2016-12-22 MED ORDER — VANCOMYCIN HCL 1000 MG IV SOLR
INTRAVENOUS | Status: AC
  Filled 2016-12-22: qty 1000

## 2016-12-22 MED ORDER — HYDROCODONE-ACETAMINOPHEN 5-325 MG PO TABS
1.0000 | ORAL_TABLET | Freq: Four times a day (QID) | ORAL | Status: DC | PRN
  Administered 2016-12-22 – 2016-12-25 (×4): 1 via ORAL
  Filled 2016-12-22 (×4): qty 1

## 2016-12-22 MED ORDER — FENTANYL CITRATE (PF) 100 MCG/2ML IJ SOLN
INTRAMUSCULAR | Status: AC
  Filled 2016-12-22: qty 2

## 2016-12-22 MED ORDER — GUAIFENESIN ER 600 MG PO TB12
600.0000 mg | ORAL_TABLET | Freq: Two times a day (BID) | ORAL | Status: DC
  Administered 2016-12-22 – 2016-12-23 (×2): 600 mg via ORAL
  Filled 2016-12-22 (×2): qty 1

## 2016-12-22 MED ORDER — BISACODYL 10 MG RE SUPP
10.0000 mg | Freq: Every day | RECTAL | Status: DC | PRN
  Administered 2016-12-25: 10 mg via RECTAL
  Filled 2016-12-22: qty 1

## 2016-12-22 MED ORDER — FENTANYL CITRATE (PF) 100 MCG/2ML IJ SOLN
INTRAMUSCULAR | Status: AC
  Administered 2016-12-22: 25 ug via INTRAVENOUS
  Filled 2016-12-22: qty 2

## 2016-12-22 MED ORDER — EPHEDRINE SULFATE-NACL 50-0.9 MG/10ML-% IV SOSY
PREFILLED_SYRINGE | INTRAVENOUS | Status: DC | PRN
  Administered 2016-12-22: 5 mg via INTRAVENOUS
  Administered 2016-12-22: 10 mg via INTRAVENOUS
  Administered 2016-12-22: 5 mg via INTRAVENOUS

## 2016-12-22 MED ORDER — TIMOLOL MALEATE 0.25 % OP SOLN
1.0000 [drp] | Freq: Two times a day (BID) | OPHTHALMIC | Status: DC
  Administered 2016-12-22 – 2016-12-25 (×6): 1 [drp] via OPHTHALMIC
  Filled 2016-12-22: qty 5

## 2016-12-22 MED ORDER — OXYCODONE HCL 5 MG/5ML PO SOLN: 5.0000 mg | Freq: Once | ORAL | Status: DC | PRN

## 2016-12-22 MED ORDER — ONDANSETRON HCL 4 MG/2ML IJ SOLN: 4.0000 mg | Freq: Four times a day (QID) | INTRAMUSCULAR | Status: DC | PRN

## 2016-12-22 MED ORDER — PROSIGHT PO TABS
1.0000 | ORAL_TABLET | Freq: Every day | ORAL | Status: DC
  Administered 2016-12-22 – 2016-12-24 (×3): 1 via ORAL
  Filled 2016-12-22 (×3): qty 1

## 2016-12-22 MED ORDER — METOCLOPRAMIDE HCL 5 MG/ML IJ SOLN: 5.0000 mg | Freq: Three times a day (TID) | INTRAMUSCULAR | Status: DC | PRN

## 2016-12-22 MED ORDER — ALUM & MAG HYDROXIDE-SIMETH 200-200-20 MG/5ML PO SUSP: 30.0000 mL | ORAL | Status: DC | PRN

## 2016-12-22 MED ORDER — PHENYLEPHRINE HCL 10 MG/ML IJ SOLN
INTRAVENOUS | Status: DC | PRN
  Administered 2016-12-22: 25 ug/min via INTRAVENOUS

## 2016-12-22 MED ORDER — ALBUTEROL SULFATE (2.5 MG/3ML) 0.083% IN NEBU: 2.5000 mg | INHALATION_SOLUTION | Freq: Four times a day (QID) | RESPIRATORY_TRACT | Status: DC | PRN

## 2016-12-22 MED ORDER — LIDOCAINE 2% (20 MG/ML) 5 ML SYRINGE
INTRAMUSCULAR | Status: DC | PRN
  Administered 2016-12-22: 60 mg via INTRAVENOUS

## 2016-12-22 MED ORDER — TOBRAMYCIN SULFATE 1.2 G IJ SOLR
INTRAMUSCULAR | Status: AC
  Filled 2016-12-22: qty 1.2

## 2016-12-22 MED ORDER — CEFAZOLIN SODIUM-DEXTROSE 2-4 GM/100ML-% IV SOLN
2.0000 g | Freq: Four times a day (QID) | INTRAVENOUS | Status: DC
  Administered 2016-12-22 – 2016-12-24 (×7): 2 g via INTRAVENOUS
  Filled 2016-12-22 (×8): qty 100

## 2016-12-22 MED ORDER — METOCLOPRAMIDE HCL 5 MG PO TABS: 5.0000 mg | ORAL_TABLET | Freq: Three times a day (TID) | ORAL | Status: DC | PRN

## 2016-12-22 MED ORDER — PROPOFOL 10 MG/ML IV BOLUS
INTRAVENOUS | Status: AC
  Filled 2016-12-22: qty 40

## 2016-12-22 MED ORDER — CALCIUM CARBONATE-VITAMIN D 500-200 MG-UNIT PO TABS
1.0000 | ORAL_TABLET | Freq: Two times a day (BID) | ORAL | Status: DC
  Administered 2016-12-22 – 2016-12-25 (×6): 1 via ORAL
  Filled 2016-12-22 (×6): qty 1

## 2016-12-22 MED ORDER — MENTHOL 3 MG MT LOZG: 1.0000 | LOZENGE | OROMUCOSAL | Status: DC | PRN

## 2016-12-22 MED ORDER — LACTATED RINGERS IV SOLN
INTRAVENOUS | Status: DC | PRN
  Administered 2016-12-22: 17:00:00 via INTRAVENOUS

## 2016-12-22 MED ORDER — VANCOMYCIN HCL IN DEXTROSE 1-5 GM/200ML-% IV SOLN
1000.0000 mg | Freq: Once | INTRAVENOUS | Status: AC
  Administered 2016-12-22: 1000 mg via INTRAVENOUS
  Filled 2016-12-22: qty 200

## 2016-12-22 MED ORDER — FENTANYL CITRATE (PF) 100 MCG/2ML IJ SOLN
25.0000 ug | INTRAMUSCULAR | Status: DC | PRN
  Administered 2016-12-22: 25 ug via INTRAVENOUS

## 2016-12-22 MED ORDER — ONDANSETRON HCL 4 MG PO TABS: 4.0000 mg | ORAL_TABLET | Freq: Four times a day (QID) | ORAL | Status: DC | PRN

## 2016-12-22 MED ORDER — PHENYLEPHRINE 40 MCG/ML (10ML) SYRINGE FOR IV PUSH (FOR BLOOD PRESSURE SUPPORT)
PREFILLED_SYRINGE | INTRAVENOUS | Status: DC | PRN
  Administered 2016-12-22: 80 ug via INTRAVENOUS
  Administered 2016-12-22: 40 ug via INTRAVENOUS
  Administered 2016-12-22: 80 ug via INTRAVENOUS
  Administered 2016-12-22: 120 ug via INTRAVENOUS
  Administered 2016-12-22: 80 ug via INTRAVENOUS

## 2016-12-22 MED ORDER — LACTATED RINGERS IV SOLN
INTRAVENOUS | Status: DC
  Administered 2016-12-22: 17:00:00 via INTRAVENOUS

## 2016-12-22 MED ORDER — CEFAZOLIN SODIUM-DEXTROSE 2-4 GM/100ML-% IV SOLN
INTRAVENOUS | Status: AC
  Filled 2016-12-22: qty 100

## 2016-12-22 MED ORDER — PHENOL 1.4 % MT LIQD: 1.0000 | OROMUCOSAL | Status: DC | PRN

## 2016-12-22 MED ORDER — POLYETHYLENE GLYCOL 3350 17 G PO PACK
17.0000 g | PACK | Freq: Every day | ORAL | Status: DC | PRN
  Administered 2016-12-24: 17 g via ORAL
  Filled 2016-12-22: qty 1

## 2016-12-22 MED ORDER — PROPOFOL 10 MG/ML IV BOLUS
INTRAVENOUS | Status: DC | PRN
  Administered 2016-12-22: 20 mg via INTRAVENOUS
  Administered 2016-12-22: 80 mg via INTRAVENOUS
  Administered 2016-12-22: 100 mg via INTRAVENOUS

## 2016-12-22 MED ORDER — DIPHENHYDRAMINE HCL 12.5 MG/5ML PO ELIX: 12.5000 mg | ORAL_SOLUTION | ORAL | Status: DC | PRN

## 2016-12-22 MED ORDER — VANCOMYCIN HCL 500 MG IV SOLR
500.0000 mg | Freq: Two times a day (BID) | INTRAVENOUS | Status: DC
  Administered 2016-12-23 – 2016-12-24 (×4): 500 mg via INTRAVENOUS
  Filled 2016-12-22 (×6): qty 500

## 2016-12-22 MED ORDER — DONEPEZIL HCL 10 MG PO TABS
10.0000 mg | ORAL_TABLET | Freq: Every day | ORAL | Status: DC
  Administered 2016-12-22 – 2016-12-24 (×3): 10 mg via ORAL
  Filled 2016-12-22 (×3): qty 1

## 2016-12-22 MED ORDER — SODIUM CHLORIDE 0.9 % IV SOLN
75.0000 mL/h | INTRAVENOUS | Status: DC
  Administered 2016-12-22: 75 mL/h via INTRAVENOUS

## 2016-12-22 MED ORDER — PREDNISONE 20 MG PO TABS
20.0000 mg | ORAL_TABLET | Freq: Every day | ORAL | Status: DC
  Administered 2016-12-23 – 2016-12-25 (×3): 20 mg via ORAL
  Filled 2016-12-22 (×3): qty 1

## 2016-12-22 MED ORDER — CEFAZOLIN SODIUM-DEXTROSE 2-3 GM-% IV SOLR
INTRAVENOUS | Status: DC | PRN
  Administered 2016-12-22: 2 g via INTRAVENOUS

## 2016-12-22 MED ORDER — DOCUSATE SODIUM 100 MG PO CAPS
100.0000 mg | ORAL_CAPSULE | Freq: Two times a day (BID) | ORAL | Status: DC
  Administered 2016-12-22 – 2016-12-25 (×6): 100 mg via ORAL
  Filled 2016-12-22 (×6): qty 1

## 2016-12-22 MED ORDER — SUCCINYLCHOLINE CHLORIDE 200 MG/10ML IV SOSY
PREFILLED_SYRINGE | INTRAVENOUS | Status: DC | PRN
  Administered 2016-12-22: 100 mg via INTRAVENOUS

## 2016-12-22 MED ORDER — TIMOLOL HEMIHYDRATE 0.25 % OP SOLN: 1.0000 [drp] | Freq: Two times a day (BID) | OPHTHALMIC | Status: DC

## 2016-12-22 MED ORDER — MEMANTINE HCL 5 MG PO TABS
5.0000 mg | ORAL_TABLET | Freq: Every day | ORAL | Status: DC
  Administered 2016-12-22 – 2016-12-24 (×3): 5 mg via ORAL
  Filled 2016-12-22 (×3): qty 1

## 2016-12-22 MED ORDER — DM-GUAIFENESIN ER 30-600 MG PO TB12
1.0000 | ORAL_TABLET | Freq: Two times a day (BID) | ORAL | Status: DC
  Administered 2016-12-22 – 2016-12-25 (×5): 1 via ORAL
  Filled 2016-12-22 (×6): qty 1

## 2016-12-22 MED ORDER — SODIUM CHLORIDE 0.9 % IV SOLN: INTRAVENOUS | Status: DC

## 2016-12-22 MED ORDER — ONDANSETRON HCL 4 MG/2ML IJ SOLN
INTRAMUSCULAR | Status: DC | PRN
  Administered 2016-12-22: 4 mg via INTRAVENOUS

## 2016-12-22 MED ORDER — FENTANYL CITRATE (PF) 100 MCG/2ML IJ SOLN
INTRAMUSCULAR | Status: DC | PRN
  Administered 2016-12-22: 25 ug via INTRAVENOUS
  Administered 2016-12-22 (×2): 50 ug via INTRAVENOUS

## 2016-12-22 SURGICAL SUPPLY — 59 items
BANDAGE ESMARK 6X9 LF (GAUZE/BANDAGES/DRESSINGS) ×1 IMPLANT
BNDG CMPR 9X6 STRL LF SNTH (GAUZE/BANDAGES/DRESSINGS) ×1
BNDG ESMARK 6X9 LF (GAUZE/BANDAGES/DRESSINGS) ×2
COVER SURGICAL LIGHT HANDLE (MISCELLANEOUS) ×2 IMPLANT
CUFF TOURNIQUET SINGLE 34IN LL (TOURNIQUET CUFF) IMPLANT
DRAPE IMP U-DRAPE 54X76 (DRAPES) ×2 IMPLANT
DRAPE ORTHO SPLIT 77X108 STRL (DRAPES) ×4
DRAPE SURG ORHT 6 SPLT 77X108 (DRAPES) ×2 IMPLANT
DRESSING ALLEVYN LIFE SACRUM (GAUZE/BANDAGES/DRESSINGS) ×1 IMPLANT
DRSG AQUACEL AG ADV 3.5X14 (GAUZE/BANDAGES/DRESSINGS) ×1 IMPLANT
DURAPREP 26ML APPLICATOR (WOUND CARE) ×2 IMPLANT
ELECT REM PT RETURN 9FT ADLT (ELECTROSURGICAL) ×2
ELECTRODE REM PT RTRN 9FT ADLT (ELECTROSURGICAL) ×1 IMPLANT
EVACUATOR 1/8 PVC DRAIN (DRAIN) ×1 IMPLANT
EVACUATOR 3/16  PVC DRAIN (DRAIN)
EVACUATOR 3/16 PVC DRAIN (DRAIN) IMPLANT
FACESHIELD WRAPAROUND (MASK) ×4 IMPLANT
FACESHIELD WRAPAROUND OR TEAM (MASK) ×2 IMPLANT
GAUZE SPONGE 4X4 12PLY STRL (GAUZE/BANDAGES/DRESSINGS) ×2 IMPLANT
GLOVE BIOGEL PI IND STRL 8 (GLOVE) ×1 IMPLANT
GLOVE BIOGEL PI IND STRL 8.5 (GLOVE) IMPLANT
GLOVE BIOGEL PI INDICATOR 8 (GLOVE) ×1
GLOVE BIOGEL PI INDICATOR 8.5 (GLOVE)
GLOVE ECLIPSE 8.0 STRL XLNG CF (GLOVE) ×2 IMPLANT
GLOVE SURG ORTHO 8.5 STRL (GLOVE) ×1 IMPLANT
GOWN STRL REUS W/ TWL LRG LVL3 (GOWN DISPOSABLE) ×2 IMPLANT
GOWN STRL REUS W/ TWL XL LVL3 (GOWN DISPOSABLE) ×1 IMPLANT
GOWN STRL REUS W/TWL LRG LVL3 (GOWN DISPOSABLE) ×4
GOWN STRL REUS W/TWL XL LVL3 (GOWN DISPOSABLE) ×2
HANDPIECE INTERPULSE COAX TIP (DISPOSABLE) ×2
IMMOBILIZER KNEE 22 UNIV (SOFTGOODS) ×1 IMPLANT
KIT BASIN OR (CUSTOM PROCEDURE TRAY) ×2 IMPLANT
KIT REMOVER STAPLE SKIN (MISCELLANEOUS) ×1 IMPLANT
KIT ROOM TURNOVER OR (KITS) ×2 IMPLANT
KIT STIMULAN RAPID CURE 5CC (Orthopedic Implant) ×1 IMPLANT
MANIFOLD NEPTUNE II (INSTRUMENTS) ×2 IMPLANT
NEEDLE 22X1 1/2 (OR ONLY) (NEEDLE) IMPLANT
NS IRRIG 1000ML POUR BTL (IV SOLUTION) ×2 IMPLANT
PACK TOTAL JOINT (CUSTOM PROCEDURE TRAY) ×2 IMPLANT
PAD ARMBOARD 7.5X6 YLW CONV (MISCELLANEOUS) ×4 IMPLANT
SET HNDPC FAN SPRY TIP SCT (DISPOSABLE) ×1 IMPLANT
STAPLER VISISTAT 35W (STAPLE) ×2 IMPLANT
SUCTION FRAZIER HANDLE 10FR (MISCELLANEOUS) ×1
SUCTION TUBE FRAZIER 10FR DISP (MISCELLANEOUS) ×1 IMPLANT
SUT BONE WAX W31G (SUTURE) IMPLANT
SUT ETHIBOND NAB CT1 #1 30IN (SUTURE) IMPLANT
SUT ETHILON 3 0 PS 1 (SUTURE) ×3 IMPLANT
SUT PDS AB 1 CT  36 (SUTURE) ×3
SUT PDS AB 1 CT 36 (SUTURE) IMPLANT
SUT VIC AB 0 CT1 27 (SUTURE) ×2
SUT VIC AB 0 CT1 27XBRD ANBCTR (SUTURE) ×1 IMPLANT
SUT VIC AB 2-0 CTB1 (SUTURE) ×4 IMPLANT
SUT VIC AB 2-0 FS1 27 (SUTURE) IMPLANT
SYR CONTROL 10ML LL (SYRINGE) IMPLANT
TOWEL OR 17X24 6PK STRL BLUE (TOWEL DISPOSABLE) ×2 IMPLANT
TOWEL OR 17X26 10 PK STRL BLUE (TOWEL DISPOSABLE) ×2 IMPLANT
TRAY FOLEY CATH 16FRSI W/METER (SET/KITS/TRAYS/PACK) ×1 IMPLANT
TUBE ANAEROBIC SPECIMEN COL (MISCELLANEOUS) ×3 IMPLANT
WATER STERILE IRR 1000ML POUR (IV SOLUTION) ×6 IMPLANT

## 2016-12-22 NOTE — Anesthesia Preprocedure Evaluation (Signed)
Anesthesia Evaluation  Patient identified by MRN, date of birth, ID band Patient awake    Reviewed: Allergy & Precautions, H&P , NPO status , Patient's Chart, lab work & pertinent test results  Airway Mallampati: II   Neck ROM: full    Dental   Pulmonary former smoker,    breath sounds clear to auscultation       Cardiovascular negative cardio ROS   Rhythm:regular Rate:Normal     Neuro/Psych PSYCHIATRIC DISORDERS Depression    GI/Hepatic   Endo/Other    Renal/GU      Musculoskeletal  (+) Arthritis ,   Abdominal   Peds  Hematology   Anesthesia Other Findings   Reproductive/Obstetrics                             Anesthesia Physical Anesthesia Plan  ASA: III  Anesthesia Plan: General   Post-op Pain Management:    Induction: Intravenous  Airway Management Planned: LMA  Additional Equipment:   Intra-op Plan:   Post-operative Plan:   Informed Consent: I have reviewed the patients History and Physical, chart, labs and discussed the procedure including the risks, benefits and alternatives for the proposed anesthesia with the patient or authorized representative who has indicated his/her understanding and acceptance.     Plan Discussed with: CRNA, Anesthesiologist and Surgeon  Anesthesia Plan Comments:         Anesthesia Quick Evaluation

## 2016-12-22 NOTE — Anesthesia Postprocedure Evaluation (Signed)
Anesthesia Post Note  Patient: Gabrielle [name]  Procedure(s) Performed: Procedure(s) (LRB): IRRIGATION AND DEBRIDEMENT HIP WITH ANTIBIOTIC BEAD APPLICATION (Right)  Patient location during evaluation: PACU Anesthesia Type: General Level of consciousness: awake and alert Pain management: pain level controlled Vital Signs Assessment: post-procedure vital signs reviewed and stable Respiratory status: spontaneous breathing, nonlabored ventilation and respiratory function stable Cardiovascular status: blood pressure returned to baseline and stable Postop Assessment: no signs of nausea or vomiting Anesthetic complications: no       Last Vitals:  Vitals:   12/22/16 2005 12/22/16 2108  BP: (!) 133/53 (!) 121/52  Pulse: 69 (!) 57  Resp: (!) 24 18  Temp:  36.6 C    Last Pain:  Vitals:   12/22/16 2108  TempSrc: Oral  PainSc:                  Jasman Pfeifle,W. EDMOND

## 2016-12-22 NOTE — H&P (Signed)
Gabrielle [name] is an 83 y.o. female.   Chief Complaint: Postop right hip infection HPI: Patient is status post right hip hemiarthroplasty performed by Dr. Annell Greening 12/06/2016. After admission was transferred to a skilled facility for rehabilitation. Presented to our office today for routine postop appointment. He had been advised by the facility the patient has been having increased drainage from her right hip.  Past Medical History:  Diagnosis Date  . Arthritis    oa  . Glaucoma 08/04/2015  . Hyperlipidemia   . Mild aortic insufficiency: Per 2 d echo 08/04/2015 08/04/2015  . Moderate mitral regurgitation: Per 2 d echo 08/04/2015 08/04/2015    Past Surgical History:  Procedure Laterality Date  . ABDOMINAL HYSTERECTOMY    . benign breast tumor removed  age 55  . HIP ARTHROPLASTY Left 08/31/2016   Procedure: ARTHROPLASTY  HIP (HEMIARTHROPLASTY);  Surgeon: Eldred Manges, MD;  Location: [facility];  Service: Orthopedics;  Laterality: Left;  . HIP ARTHROPLASTY Right 12/06/2016   Procedure: ARTHROPLASTY BIPOLAR HIP (HEMIARTHROPLASTY);  Surgeon: Eldred Manges, MD;  Location: [facility] ORS;  Service: Orthopedics;  Laterality: Right;  . TONSILLECTOMY  age 6  . TOTAL KNEE ARTHROPLASTY Right 11/21/2013   Procedure: RIGHT TOTAL KNEE ARTHROPLASTY;  Surgeon: Loanne Drilling, MD;  Location: [facility] ORS;  Service: Orthopedics;  Laterality: Right;  . WRIST FRACTURE SURGERY Right     No family history on file. Social History:  reports that she quit smoking about 51 years ago. Her smoking use included Cigarettes. She has a 17.00 pack-year smoking history. She has never used smokeless tobacco. She reports that she drinks alcohol. She reports that she does not use drugs.  Allergies: No Known Allergies  Medications Prior to Admission  Medication Sig Dispense Refill  . albuterol (PROVENTIL) (2.5 MG/3ML) 0.083% nebulizer solution Take 3 mLs (2.5 mg total) by nebulization every 6 (six) hours as needed for wheezing or  shortness of breath.    Marland Kitchen atorvastatin (LIPITOR) 20 MG tablet Take 20 mg by mouth at bedtime.     . bisacodyl (DULCOLAX) 5 MG EC tablet Take 1 tablet (5 mg total) by mouth daily as needed for moderate constipation. (Patient not taking: Reported on 12/05/2016) 30 tablet 0  . Calcium Carb-Cholecalciferol (CALCIUM 600 + D PO) Take 1 tablet by mouth 2 (two) times daily.    . Cholecalciferol (VITAMIN D) 2000 UNITS CAPS Take 4,000 Units by mouth at bedtime.     . citalopram (CELEXA) 40 MG tablet Take 40 mg by mouth at bedtime.     Marland Kitchen dextromethorphan-guaiFENesin (MUCINEX DM) 30-600 MG 12hr tablet Take 1 tablet by mouth 2 (two) times daily.    Marland Kitchen donepezil (ARICEPT) 10 MG tablet Take 10 mg by mouth at bedtime.     . enoxaparin (LOVENOX) 30 MG/0.3ML injection Inject 0.3 mLs (30 mg total) into the skin daily. 28 Syringe 0  . feeding supplement, ENSURE ENLIVE, (ENSURE ENLIVE) LIQD Take 237 mLs by mouth 2 (two) times daily between meals.    Marland Kitchen HYDROcodone-acetaminophen (NORCO) 5-325 MG tablet Take 1 tablet by mouth every 6 (six) hours as needed for moderate pain. 60 tablet 0  . memantine (NAMENDA) 5 MG tablet Take 5 mg by mouth at bedtime.    . Multiple Vitamins-Minerals (ICAPS MV) TABS Take 1 tablet by mouth at bedtime.     . Omega-3 Fatty Acids (FISH OIL) 1000 MG CAPS Take 1,000 mg by mouth at bedtime.     . senna-docusate (SENOKOT-S) 8.6-50  MG tablet Take 1 tablet by mouth at bedtime as needed for mild constipation.    . timolol (BETIMOL) 0.25 % ophthalmic solution Place 1 drop into both eyes 2 (two) times daily.       No results found for this or any previous visit (from the past 48 hour(s)). No results found.  Review of Systems  Constitutional: Negative for fever.  HENT: Negative.   Respiratory: Negative for shortness of breath.   Cardiovascular: Negative for chest pain.  Gastrointestinal: Negative.   Genitourinary: Negative.   Musculoskeletal: Positive for joint pain.  Neurological: Negative.    Psychiatric/Behavioral: Negative.     There were no vitals taken for this visit. Physical Exam  Constitutional: No distress.  HENT:  Head: Normocephalic.  Eyes: EOM are normal. Pupils are equal, round, and reactive to light.  Neck: Normal range of motion.  Respiratory: No respiratory distress.  GI: She exhibits no distension.  Musculoskeletal:  Right hip staples are intact. She does have swelling and redness of the hip around incision   Around the incision she is somewhat tender. She does have serosanguineous and somewhat purulent drainage from her wound.  Neurological: She is alert.  Psychiatric: She has a normal mood and affect.     Assessment/Plan Status post right hip hemiarthroplasty 12/06/2016 with postop wound infection.  We did a direct admission from our office today. Reviewed the case with Dr. Norlene CampbellPeter Whitfield and he will post patient for a right hip I&D this evening. Surgical procedure was discussed with patient today. All questions answered. In the office we did get wound cultures. Patient is NPO. States that she had coffee around 10:30 this morning.  12/22/2016, 1:07 PM

## 2016-12-22 NOTE — Progress Notes (Signed)
Post-Op Visit Note   Patient: Gabrielle [name]           Date of Birth: 1927-09-10           MRN: [id number] Visit Date: 12/22/2016 PCP: Gweneth Dimitri, MD   Assessment & Plan:  Chief Complaint:  Chief Complaint  Patient presents with  . Right Hip - Wound Check   Visit Diagnoses:  1. Closed fracture of right hip with routine healing, subsequent encounter   2. Postoperative infection, initial encounter   3. Postoperative wound infection of right hip, initial encounter     Plan: We'll do direct admit to [facility] from our office and discussed clinical findings with on call physician Dr. [name]. He will plan on doing a right hip I&D this evening. This discussed with patient today. All questions answered.  Follow-Up Instructions: No Follow-up on file.   Orders:  Orders Placed This Encounter  Procedures  . Gram stain  . WOUND CULTURE  . XR HIP UNILAT W OR W/O PELVIS 2-3 VIEWS RIGHT   No orders of the defined types were placed in this encounter.  Patient returns for first post op visit. She is status post right hip hemiarthroplasty on 12/06/2016. She is two weeks and two days post op. She is in a wheelchair today. She states that she is not having any pain. She is unsure if she has physical therapy and gets up to walk any. She states that she does what they tell her to do.   Imaging: No results found.  PMFS History: Patient Active Problem List   Diagnosis Date Noted  . SOB (shortness of breath)   . Influenza A 12/06/2016  . Closed fracture of right hip (HCC) 12/05/2016  . Dementia 12/05/2016  . Fall 12/05/2016  . Cough 12/05/2016  . S/P hip hemiarthroplasty 09/16/2016  . Mallet finger, left small finger 09/16/2016  . Hip fracture (HCC) 08/30/2016  . Vascular dementia 08/30/2016  . Osteoporosis 08/30/2016  . Vaginal candida 08/30/2016  . UTI (urinary tract infection) 08/30/2016  . Mild aortic insufficiency: Per 2 d echo 08/04/2015 08/04/2015  .  Moderate mitral regurgitation: Per 2 d echo 08/04/2015 08/04/2015  . Glaucoma 08/04/2015  . Dizziness 08/04/2015  . Syncope 08/03/2015  . Depression 12/09/2013  . Constipation 11/30/2013  . Pure hypercholesterolemia 11/30/2013  . Acute posthemorrhagic anemia 11/30/2013  . OA (osteoarthritis) of knee 11/21/2013   Past Medical History:  Diagnosis Date  . Arthritis    oa  . Glaucoma 08/04/2015  . Hyperlipidemia   . Mild aortic insufficiency: Per 2 d echo 08/04/2015 08/04/2015  . Moderate mitral regurgitation: Per 2 d echo 08/04/2015 08/04/2015    No family history on file.  Past Surgical History:  Procedure Laterality Date  . ABDOMINAL HYSTERECTOMY    . benign breast tumor removed  age 17  . HIP ARTHROPLASTY Left 08/31/2016   Procedure: ARTHROPLASTY  HIP (HEMIARTHROPLASTY);  Surgeon: Eldred Manges, MD;  Location: [facility] OR;  Service: Orthopedics;  Laterality: Left;  . HIP ARTHROPLASTY Right 12/06/2016   Procedure: ARTHROPLASTY BIPOLAR HIP (HEMIARTHROPLASTY);  Surgeon: Eldred Manges, MD;  Location: [facility] ORS;  Service: Orthopedics;  Laterality: Right;  . TONSILLECTOMY  age 12  . TOTAL KNEE ARTHROPLASTY Right 11/21/2013   Procedure: RIGHT TOTAL KNEE ARTHROPLASTY;  Surgeon: Loanne Drilling, MD;  Location: [facility] ORS;  Service: Orthopedics;  Laterality: Right;  . WRIST FRACTURE SURGERY Right    Social History   Occupational History  . Not  on file.   Social History Main Topics  . Smoking status: Former Smoker    Packs/day: 1.00    Years: 17.00    Types: Cigarettes    Quit date: 11/10/1965  . Smokeless tobacco: Never Used  . Alcohol use Yes     Comment: very occasional wine  . Drug use: No  . Sexual activity: Not on file   Exam:  Right hip wound staples are intact. She does have some swelling and redness. Serosanguineous and somewhat purulent -looking drainage.

## 2016-12-22 NOTE — Progress Notes (Signed)
TED hose applied per order, Foot pumps applied per order.

## 2016-12-22 NOTE — Progress Notes (Signed)
Pharmacy Antibiotic Note  Gabrielle [name] is a 83 y.o. female admitted on 12/22/2016 with wound drainage from a recent R-THA site from 12/06/16. Pharmacy has been consulted for Vancomcyin dosing.  An I&D was performed earlier today and Ancef 2g was given intra-op at 1830. It does not appear as if the patient has received any Vancomycin yet this admit. SCr 0.77, CrCl~50-60 ml/min.   Plan: 1. Vancomycin 1g IV x 1 dose now followed by 500 mg IV every 12 hours 2. Will continue to follow renal function, culture results, LOT, and antibiotic de-escalation plans     Temp (24hrs), Avg:97.9 F (36.6 C), Min:97.4 F (36.3 C), Max:98.3 F (36.8 C)   Recent Labs Lab 12/22/16 1432  WBC 14.5*  CREATININE 0.77    CrCl cannot be calculated (Unknown ideal weight.).    No Known Allergies  Antimicrobials this admission: Vanc 2/12 >>  Dose adjustments this admission:  Microbiology results: 2/12 Wound cx (intra-op) >>  Thank you for allowing pharmacy to be a part of this patient's care.  Georgina PillionElizabeth Bailey Kolbe, PharmD, BCPS Clinical Pharmacist Pager: [phone] 12/22/2016 9:43 PM

## 2016-12-22 NOTE — Anesthesia Procedure Notes (Signed)
Procedure Name: Intubation Date/Time: 12/22/2016 6:06 PM Performed by: Annabelle HarmanSMITH, Shiann Kam A Pre-anesthesia Checklist: Patient identified, Emergency Drugs available, Suction available and Patient being monitored Patient Re-evaluated:Patient Re-evaluated prior to inductionOxygen Delivery Method: Circle system utilized Preoxygenation: Pre-oxygenation with 100% oxygen Intubation Type: IV induction Ventilation: Mask ventilation without difficulty Laryngoscope Size: Miller and 2 Grade View: Grade I Tube type: Oral Tube size: 7.0 mm Number of attempts: 1 Airway Equipment and Method: Stylet Placement Confirmation: ETT inserted through vocal cords under direct vision,  positive ETCO2 and breath sounds checked- equal and bilateral Secured at: 22 cm Tube secured with: Tape Dental Injury: Teeth and Oropharynx as per pre-operative assessment

## 2016-12-22 NOTE — Op Note (Signed)
PATIENT ID:      Gabrielle LeanBarbara A [name]  MRN:     [id number] DOB/AGE:    81-09-1927 / 81 y.o.       OPERATIVE REPORT    DATE OF PROCEDURE:  12/22/2016       PREOPERATIVE DIAGNOSIS: wound drainage from right hip hemiarthroplasty                                                       Estimated body mass index is 23.33 kg/m as calculated from the following:   Height as of 12/05/16: 5\' 9"  (1.753 m).   Weight as of 12/05/16: 158 lb (71.7 kg).     POSTOPERATIVE DIAGNOSIS:   same                                                                    Estimated body mass index is 23.33 kg/m as calculated from the following:   Height as of 12/05/16: 5\' 9"  (1.753 m).   Weight as of 12/05/16: 158 lb (71.7 kg).     PROCEDURE:  Procedure(s): IRRIGATION AND DEBRIDEMENT RIGHT HIP     SURGEON:  Norlene CampbellPeter Johnie Makki, MD    ASSISTANT:   Jacqualine CodeBrian Petrarca, PA-C   (Present and scrubbed throughout the case, critical for assistance with exposure, retraction, instrumentation, and closure.)          ANESTHESIA: general     DRAINS: HEMOVAC DRAIN IN RIGHT HIP:      TOURNIQUET TIME:  NONE   COMPLICATIONS:  None   CONDITION:  stable  PROCEDURE IN DETAIL: [id number]   Gabrielle [name] 12/22/2016, 7:34 PM

## 2016-12-22 NOTE — Anesthesia Procedure Notes (Signed)
Procedure Name: Intubation Date/Time: 12/22/2016 6:02 PM Performed by: Annabelle HarmanSMITH, Keldric Poyer A Pre-anesthesia Checklist: Patient identified, Emergency Drugs available, Suction available and Patient being monitored Patient Re-evaluated:Patient Re-evaluated prior to inductionOxygen Delivery Method: Circle system utilized Preoxygenation: Pre-oxygenation with 100% oxygen Intubation Type: IV induction Ventilation: Mask ventilation without difficulty LMA: LMA inserted LMA Size: 4.0 Tube secured with: Tape

## 2016-12-22 NOTE — Transfer of Care (Signed)
Immediate Anesthesia Transfer of Care Note  Patient: Gabrielle CanaryBarbara A Swim  Procedure(s) Performed: Procedure(s): IRRIGATION AND DEBRIDEMENT HIP (N/A)  Patient Location: PACU  Anesthesia Type:General  Level of Consciousness: awake, alert , oriented and patient cooperative  Airway & Oxygen Therapy: Patient Spontanous Breathing and Patient connected to face mask oxygen  Post-op Assessment: Report given to RN and Post -op Vital signs reviewed and stable  Post vital signs: Reviewed and stable  Last Vitals:  Vitals:   12/22/16 1540 12/22/16 1950  BP: (!) 131/52   Pulse: 62   Resp: 18   Temp: 36.8 C (P) 36.3 C    Last Pain:  Vitals:   12/22/16 1950  TempSrc:   PainSc: (P) 0-No pain      Patients Stated Pain Goal: 0 (12/22/16 1600)  Complications: No apparent anesthesia complications

## 2016-12-23 ENCOUNTER — Encounter (HOSPITAL_COMMUNITY): Payer: Self-pay | Admitting: Orthopaedic Surgery

## 2016-12-23 DIAGNOSIS — T8451XA Infection and inflammatory reaction due to internal right hip prosthesis, initial encounter: Principal | ICD-10-CM

## 2016-12-23 DIAGNOSIS — T8459XA Infection and inflammatory reaction due to other internal joint prosthesis, initial encounter: Secondary | ICD-10-CM

## 2016-12-23 DIAGNOSIS — G309 Alzheimer's disease, unspecified: Secondary | ICD-10-CM

## 2016-12-23 DIAGNOSIS — A4901 Methicillin susceptible Staphylococcus aureus infection, unspecified site: Secondary | ICD-10-CM

## 2016-12-23 DIAGNOSIS — F0281 Dementia in other diseases classified elsewhere with behavioral disturbance: Secondary | ICD-10-CM

## 2016-12-23 DIAGNOSIS — Y792 Prosthetic and other implants, materials and accessory orthopedic devices associated with adverse incidents: Secondary | ICD-10-CM

## 2016-12-23 DIAGNOSIS — B9561 Methicillin susceptible Staphylococcus aureus infection as the cause of diseases classified elsewhere: Secondary | ICD-10-CM

## 2016-12-23 DIAGNOSIS — Z96649 Presence of unspecified artificial hip joint: Secondary | ICD-10-CM

## 2016-12-23 LAB — CBC
HEMATOCRIT: 29.5 % — AB (ref 36.0–46.0)
Hemoglobin: 9.1 g/dL — ABNORMAL LOW (ref 12.0–15.0)
MCH: 28.6 pg (ref 26.0–34.0)
MCHC: 30.8 g/dL (ref 30.0–36.0)
MCV: 92.8 fL (ref 78.0–100.0)
PLATELETS: 316 10*3/uL (ref 150–400)
RBC: 3.18 MIL/uL — ABNORMAL LOW (ref 3.87–5.11)
RDW: 15.7 % — AB (ref 11.5–15.5)
WBC: 14.2 10*3/uL — AB (ref 4.0–10.5)

## 2016-12-23 LAB — BASIC METABOLIC PANEL WITH GFR
Anion gap: 8 (ref 5–15)
BUN: 11 mg/dL (ref 6–20)
CO2: 28 mmol/L (ref 22–32)
Calcium: 8.8 mg/dL — ABNORMAL LOW (ref 8.9–10.3)
Chloride: 98 mmol/L — ABNORMAL LOW (ref 101–111)
Creatinine, Ser: 0.67 mg/dL (ref 0.44–1.00)
GFR calc Af Amer: >60 mL/min (ref >60)
GFR calc non Af Amer: >60 mL/min (ref >60)
Glucose, Bld: 97 mg/dL (ref 65–99)
Potassium: 3.8 mmol/L (ref 3.5–5.1)
Sodium: 134 mmol/L — ABNORMAL LOW (ref 135–145)

## 2016-12-23 LAB — HIV ANTIBODY (ROUTINE TESTING W REFLEX): HIV SCREEN 4TH GENERATION: NONREACTIVE

## 2016-12-23 MED ORDER — MUPIROCIN 2 % EX OINT
1.0000 "application " | TOPICAL_OINTMENT | Freq: Two times a day (BID) | CUTANEOUS | Status: DC
  Administered 2016-12-23 – 2016-12-25 (×5): 1 via NASAL
  Filled 2016-12-23: qty 22

## 2016-12-23 MED ORDER — CHLORHEXIDINE GLUCONATE CLOTH 2 % EX PADS
6.0000 | MEDICATED_PAD | Freq: Every day | CUTANEOUS | Status: DC
Start: 2016-12-23 — End: 2016-12-25
  Administered 2016-12-23 – 2016-12-24 (×2): 6 via TOPICAL

## 2016-12-23 NOTE — Op Note (Signed)
NAMECATHELEEN, Gabrielle [name] NO.:  [id number]  MEDICAL RECORD NO.:  [id number]  LOCATION:  74                         FACILITY:  [facility]  PHYSICIAN:  Vonna Kotyk. Shana Younge, M.D.DATE OF BIRTH:  [birth year]  DATE OF PROCEDURE:  12/22/2016 DATE OF DISCHARGE:                              OPERATIVE REPORT   PREOPERATIVE DIAGNOSIS:  Wound drainage from right hip hemiarthroplasty.  POSTOPERATIVE DIAGNOSIS:  Wound drainage from right hip hemiarthroplasty - cultures pending.  PROCEDURE:  Irrigation and debridement, right hip hemiarthroplasty.  SURGEON:  Vonna Kotyk. Durward Fortes, M.D.  ASSISTANT:  Aaron Edelman D. Petrarca, P.A.-C.  ANESTHESIA:  General.  COMPLICATIONS:  None.  DESCRIPTION OF PROCEDURE:  [name] was met with the family in the holding area, identified that the right hip was appropriate operative site and marked it accordingly.  The patient was then transported to room #7 and more comfortable on the operating table, placed under general anesthesia.  The patient was then placed in the lateral decubitus position with the right side up and secured to the operating room table with the Innomed hip system.  Time-out was called.  The right hip was then prepped with chlorhexidine scrub and DuraPrep from iliac crest to the ankle.  Sterile draping was performed.  Time-out was called again.  The posterior hip wound was then opened along its entire length.  There was obvious drainage even in the superficial tissue and as we went through the deeper fascia, it was obvious that there was fluid draining from the hip joint and the hemiarthroplasty.  The procedure had been performed approximately 2 weeks ago.  We obtained cultures of any fluid in the superficial deep fascia and in the hip joint.  These were sent for anaerobic and anaerobic culture as well as Gram stain.  The Gram stain was not available at the time of wound closure.  We copiously irrigated the wound with over  6000 mL of sterile saline. Any grossly abnormal tissue was debrided.  There appeared to be some evidence of fat necrosis.  The fluid in the deep wound was somewhat cloudy and I was not sure if this was infected or just fatty necrosis. The patient did have an elevated white cell count preoperatively to 14,500.  After copiously irrigating the hip joint, we then inserted Stimulan beads with added tobramycin and vancomycin.  The beads were then inserted into the hip joint after irrigation.  We had placed other beads within the soft tissue and then closed the wound over Hemovac drain with 2-0 PDS suture.  Any prior suture that was grossly visible was removed. We then closed the wound with 3-0 nylon.  Sterile bulky dressing was applied using the Aquasol dressing.  The patient was then placed supine on the operating room stretcher and returned to the postanesthesia recovery room in satisfactory condition after extubation.     Vonna Kotyk. Durward Fortes, M.D.     PWW/MEDQ  D:  12/22/2016  T:  12/23/2016  Job:  [id number]

## 2016-12-23 NOTE — Progress Notes (Signed)
PATIENT ID: Gabrielle [name]        MRN:  [id number]          DOB/AGE: May 27, 1927 / 83 y.o.    [name], MD   [name], PA-C [address] [place], [place]  [address]                             [phone]   PROGRESS NOTE  Subjective:  negative for Chest Pain  negative for Shortness of Breath  negative for Nausea/Vomiting   negative for Calf Pain    Tolerating Diet: yes         Patient reports pain as mild.     Sitting up in chair without related problems  Objective: Vital signs in last 24 hours:   Patient Vitals for the past 24 hrs:  BP Temp Temp src Pulse Resp SpO2  12/23/16 1447 (!) 84/45 97.7 F (36.5 C) Oral (!) 53 16 100 %  12/23/16 0816 (!) 91/42 98.1 F (36.7 C) Oral 61 - 100 %  12/23/16 0121 (!) 95/47 - - 61 - -  12/23/16 0115 (!) 84/39 97.9 F (36.6 C) Oral 60 16 99 %  12/22/16 2108 (!) 121/52 97.9 F (36.6 C) Oral (!) 57 18 100 %  12/22/16 2106 - - - - - 96 %  12/22/16 2005 (!) 133/53 - - 69 (!) 24 96 %  12/22/16 1950 (!) 136/56 97.4 F (36.3 C) - 76 17 100 %      Intake/Output from previous day:   02/12 0701 - 02/13 0700 In: 800 [I.V.:800] Out: 50    Intake/Output this shift:   02/13 0701 - 02/13 1900 In: 237 [P.O.:237] Out: 610 [Urine:510; Drains:100]   Intake/Output      02/12 0701 - 02/13 0700 02/13 0701 - 02/14 0700   P.O. 0 237   I.V. 800    Total Intake 800 237   Urine 0 510   Drains  100   Blood 50    Total Output 50 610   Net +750 -373        Urine Occurrence 2 x       LABORATORY DATA:  Recent Labs  12/22/16 1432 12/23/16 0316  WBC 14.5* 14.2*  HGB 11.4* 9.1*  HCT 36.2 29.5*  PLT 394 316    Recent Labs  12/22/16 1432 12/23/16 0316  NA 136 134*  K 3.9 3.8  CL 97* 98*  CO2 29 28  BUN 11 11  CREATININE 0.77 0.67  GLUCOSE 133* 97  CALCIUM 9.8 8.8*   Lab Results  Component Value Date   INR 1.30 12/22/2016   INR 1.10 12/05/2016   INR 1.08 08/30/2016    Recent Radiographic Studies :  Dg  Chest 1 View  Result Date: 12/05/2016 CLINICAL DATA:  Preoperative chest radiograph, for hip fracture. Cough. Initial encounter. EXAM: CHEST 1 VIEW COMPARISON:  Chest radiograph performed 08/30/2016 FINDINGS: The lungs are well-aerated and clear. There is no evidence of focal opacification, pleural effusion or pneumothorax. The cardiomediastinal silhouette is borderline normal in size. No acute osseous abnormalities are seen. Right convex thoracic scoliosis is noted. IMPRESSION: No acute cardiopulmonary process seen. Right convex thoracic scoliosis noted. No displaced rib fractures identified. Electronically Signed   By: Roanna Raider M.D.   On: 12/05/2016 19:13   Ct Head Wo Contrast  Result Date: 12/05/2016 CLINICAL DATA:  Dizziness with subsequent fall. EXAM: CT HEAD WITHOUT  CONTRAST CT CERVICAL SPINE WITHOUT CONTRAST TECHNIQUE: Multidetector CT imaging of the head and cervical spine was performed following the standard protocol without intravenous contrast. Multiplanar CT image reconstructions of the cervical spine were also generated. COMPARISON:  08/03/2015 FINDINGS: CT HEAD FINDINGS Brain: Ventricles and cisterns are within normal. There is mild age related atrophic change. There is no mass, mass effect, shift of midline structures or acute hemorrhage. No evidence of acute infarction. Minimal chronic ischemic microvascular disease. Vascular: Mild plaque over the cavernous segment of the internal carotid arteries. Skull: Within normal. Sinuses/Orbits: Orbits are within normal. Paranasal sinuses are well developed and well aerated without air-fluid levels or significant opacification. Other: None. CT CERVICAL SPINE FINDINGS Alignment:  Within normal. Skull base and vertebrae: Vertebral body alignment and heights are within normal. There is mild spondylosis throughout the cervical spine. There is moderate uncovertebral joint spurring and mild facet arthropathy. No acute fracture. Soft tissues and spinal  canal: Prevertebral soft tissues are within normal. Spinal canal is within normal. Disc levels: Mild disc space narrowing at the C3-4, C4-5, C5-6 and C6-7 levels. Upper chest: Within normal. Other: None. IMPRESSION: No acute intracranial findings. Minimal chronic ischemic microvascular disease. No acute cervical spine injury. Mild spondylosis throughout the cervical spine with mild multilevel disc disease. Electronically Signed   By: Elberta Fortis M.D.   On: 12/05/2016 19:02   Ct Cervical Spine Wo Contrast  Result Date: 12/05/2016 CLINICAL DATA:  Dizziness with subsequent fall. EXAM: CT HEAD WITHOUT CONTRAST CT CERVICAL SPINE WITHOUT CONTRAST TECHNIQUE: Multidetector CT imaging of the head and cervical spine was performed following the standard protocol without intravenous contrast. Multiplanar CT image reconstructions of the cervical spine were also generated. COMPARISON:  08/03/2015 FINDINGS: CT HEAD FINDINGS Brain: Ventricles and cisterns are within normal. There is mild age related atrophic change. There is no mass, mass effect, shift of midline structures or acute hemorrhage. No evidence of acute infarction. Minimal chronic ischemic microvascular disease. Vascular: Mild plaque over the cavernous segment of the internal carotid arteries. Skull: Within normal. Sinuses/Orbits: Orbits are within normal. Paranasal sinuses are well developed and well aerated without air-fluid levels or significant opacification. Other: None. CT CERVICAL SPINE FINDINGS Alignment:  Within normal. Skull base and vertebrae: Vertebral body alignment and heights are within normal. There is mild spondylosis throughout the cervical spine. There is moderate uncovertebral joint spurring and mild facet arthropathy. No acute fracture. Soft tissues and spinal canal: Prevertebral soft tissues are within normal. Spinal canal is within normal. Disc levels: Mild disc space narrowing at the C3-4, C4-5, C5-6 and C6-7 levels. Upper chest: Within  normal. Other: None. IMPRESSION: No acute intracranial findings. Minimal chronic ischemic microvascular disease. No acute cervical spine injury. Mild spondylosis throughout the cervical spine with mild multilevel disc disease. Electronically Signed   By: Elberta Fortis M.D.   On: 12/05/2016 19:02   Dg Pelvis Portable  Result Date: 12/22/2016 CLINICAL DATA:  Status post debridement of right hip. Assess for dislocation. Initial encounter. EXAM: PORTABLE PELVIS 1-2 VIEWS COMPARISON:  Right hip radiographs performed earlier today at 11:21 a.m. FINDINGS: There is no evidence of dislocation at this time. Both hip arthroplasties are grossly unremarkable in appearance, without evidence of loosening. There is no evidence of fracture. Postoperative air is seen about the right hip, with associated drainage catheter. IMPRESSION: No evidence of dislocation at this time. Bilateral hip arthroplasties are unremarkable in appearance. Electronically Signed   By: Roanna Raider M.D.   On: 12/22/2016 20:44  Pelvis Portable  Result Date: 12/06/2016 CLINICAL DATA:  Status post right hip arthroplasty. EXAM: PORTABLE PELVIS 1-2 VIEWS COMPARISON:  08/31/2016 FINDINGS: New right hip hemiarthroplasty appears well-seated and aligned. Left hemiarthroplasty is stable, also well-seated and aligned. No acute fracture or evidence of an operative complication. IMPRESSION: Well-positioned right hip hemiarthroplasty. Electronically Signed   By: Amie Portland M.D.   On: 12/06/2016 09:50   Dg Chest Port 1 View  Result Date: 12/22/2016 CLINICAL DATA:  Preop testing right hip surgery EXAM: PORTABLE CHEST 1 VIEW COMPARISON:  12/09/2016 FINDINGS: Lungs remain clear without infiltrate or effusion. Negative for heart failure. Atherosclerotic calcification aortic arch. Marked dextroscoliosis thoracic spine unchanged. IMPRESSION: No active disease. Electronically Signed   By: Marlan Palau M.D.   On: 12/22/2016 14:16   Dg Chest Port 1  View  Result Date: 12/09/2016 CLINICAL DATA:  Shortness of breath, cough EXAM: PORTABLE CHEST 1 VIEW COMPARISON:  12/05/2016 FINDINGS: There is hyperinflation of the lungs compatible with COPD. Left lower lobe atelectasis. Right lung is clear. No effusions. Heart is mildly enlarged. Tortuosity of the thoracic aorta with calcifications. Severe thoracolumbar scoliosis. IMPRESSION: COPD.  Left base atelectasis. Electronically Signed   By: Charlett Nose M.D.   On: 12/09/2016 12:41   Dg Hip Port Unilat With Pelvis 1v Right  Result Date: 12/22/2016 CLINICAL DATA:  Status post debridement of right hip. Assess for dislocation. Initial encounter. EXAM: DG HIP (WITH OR WITHOUT PELVIS) 1V PORT RIGHT COMPARISON:  Right hip radiographs performed earlier today at 11:21 a.m. FINDINGS: There is no evidence of dislocation on the provided cross-table lateral view. There is no evidence of loosening. A drainage catheter is noted overlying the right hip. The right hip arthroplasty is grossly unremarkable in appearance. IMPRESSION: No evidence of dislocation. Right hip arthroplasty is grossly unremarkable in appearance. Electronically Signed   By: Roanna Raider M.D.   On: 12/22/2016 20:45   Dg Hip Unilat  With Pelvis 2-3 Views Right  Result Date: 12/05/2016 CLINICAL DATA:  Acute onset of syncope and fall. Right hip pain. Initial encounter. EXAM: DG HIP (WITH OR WITHOUT PELVIS) 2-3V RIGHT COMPARISON:  None. FINDINGS: There is a mildly displaced subcapital fracture through the right femoral neck, with superior displacement of the distal femur. The right femoral head remains seated at the acetabulum. The left hip arthroplasty is grossly unremarkable. Mild degenerative change is noted at the lower lumbar spine. The sacroiliac joints are unremarkable in appearance. The visualized bowel gas pattern is grossly unremarkable in appearance. IMPRESSION: Mildly displaced subcapital fracture through the right femoral neck, with superior  displacement of the distal femur. Electronically Signed   By: Roanna Raider M.D.   On: 12/05/2016 17:58     Examination:  General appearance: alert, cooperative and no distress  Wound Exam: clean, dry, intact   Drainage:  Scant/small amount Serosanguinous exudate in hemovac  Motor Exam: EHL, FHL, Anterior Tibial and Posterior Tibial Intact  Sensory Exam: Superficial Peroneal, Deep Peroneal and Tibial normal  Vascular Exam: Normal  Assessment:    1 Day Post-Op  Procedure(s) (LRB): IRRIGATION AND DEBRIDEMENT HIP WITH ANTIBIOTIC BEAD APPLICATION (Right)  ADDITIONAL DIAGNOSIS:  Principal Problem:   Infection and inflammatory reaction due to internal right hip prosthesis, initial encounter (HCC) Active Problems:   Postoperative wound infection of right hip   S/P total hip arthroplasty   Prosthetic hip infection (HCC)   Staphylococcus aureus infection   Alzheimer's dementia with behavioral disturbance  Acute Blood Loss Anemia-asymptomatic   Plan:  Physical Therapy as ordered Weight Bearing as Tolerated (WBAT)  DVT Prophylaxis:  Aspirin, Foot Pumps and TED hose  DISCHARGE PLAN: Skilled Nursing Facility/Rehab  DISCHARGE NEEDS: has equipment   per ID await cultures for appropriate antibiotic coverage, OOB with PT, D/C hemovac in am     Valeria Batmaneter W Ashraf Mesta  12/23/2016 4:52 PM  Patient ID: Gabrielle [name], female   DOB: 09-07-1927, 83 y.o.   MRN: [id number]

## 2016-12-23 NOTE — Evaluation (Signed)
Occupational Therapy Evaluation Patient Details Name: Gabrielle [name] MRN: [id number] DOB: 04/17/1927 Today's Date: 12/23/2016    History of Present Illness Patient is status post right hip hemiarthroplasty performed by Dr. Annell Greening 12/06/2016. After admission was transferred to a skilled facility for rehabilitation.  Presented with R hip infection, now s/p I&D R hip; Post Prec, WBAT;  has a past medical history of Arthritis; Glaucoma (08/04/2015); Hyperlipidemia; Mild aortic insufficiency; and Moderate mitral regurgitation;  has a pertinent past surgical history that includes  Total knee arthroplasty (Right, 11/21/2013); Hip Arthroplasty (Left, 08/31/2016); and Hip Arthroplasty (Right, 12/06/2016).   Clinical Impression   Pt requires extensive assist with ADLs and mod A +2 for mobility. PTA, pt at SNF participating in rehab and will return to SNF once cleared by MD. No further acute OT indicated, recommend and defer further OT intervention to SNF    Follow Up Recommendations  SNF    Equipment Recommendations  None recommended by OT    Recommendations for Other Services       Precautions / Restrictions Precautions Precautions: Fall;Posterior Hip Precaution Booklet Issued: Yes (comment) Precaution Comments: Educated pt in Posterior Precautions; anticipate the need to guard closely and reinforce post precautions Required Braces or Orthoses: Knee Immobilizer - Right Restrictions Weight Bearing Restrictions: Yes RLE Weight Bearing: Weight bearing as tolerated      Mobility Bed Mobility Overal bed mobility: Needs Assistance Bed Mobility: Supine to Sit     Supine to sit: Max assist;+2 for physical assistance;+2 for safety/equipment     General bed mobility comments: multi-modal cues for technique, self assist and hip precautions  Transfers Overall transfer level: Needs assistance Equipment used: Rolling walker (2 wheeled) Transfers: Sit to/from Stand Sit to Stand:  Mod assist;+2 physical assistance Stand pivot transfers: Min assist;+2 physical assistance;+2 safety/equipment       General transfer comment: cues for hand placement, RLE position and overall safety; Mod assist to power up, and to control descent to sit    Balance Overall balance assessment: Needs assistance;History of Falls Sitting-balance support: No upper extremity supported;Feet supported Sitting balance-Leahy Scale: Fair     Standing balance support: Bilateral upper extremity supported Standing balance-Leahy Scale: Poor Standing balance comment: reliant on UEs and external assist                            ADL Overall ADL's : Needs assistance/impaired     Grooming: Sitting;Min guard;Wash/dry hands;Wash/dry face   Upper Body Bathing: Min guard;Sitting   Lower Body Bathing: Maximal assistance   Upper Body Dressing : Min guard;Sitting   Lower Body Dressing: Total assistance   Toilet Transfer: Minimal assistance;Moderate assistance;Ambulation;RW;Cueing for safety;+2 for physical assistance;+2 for safety/equipment   Toileting- Clothing Manipulation and Hygiene: Total assistance       Functional mobility during ADLs: Cueing for safety;Rolling walker;Minimal assistance;Moderate assistance;+2 for physical assistance;+2 for safety/equipment       Vision Vision Assessment?: No apparent visual deficits              Pertinent Vitals/Pain Pain Assessment: Faces Pain Score: 10-Worst pain ever Faces Pain Scale: Hurts worst Pain Location: R hip with mobility Pain Descriptors / Indicators: Grimacing;Guarding;Moaning Pain Intervention(s): Limited activity within patient's tolerance;Premedicated before session;Repositioned;Monitored during session     Hand Dominance Right   Extremity/Trunk Assessment Upper Extremity Assessment Upper Extremity Assessment: Generalized weakness   Lower Extremity Assessment Lower Extremity Assessment: Defer to PT  evaluation RLE Deficits /  Details: Grossly decr A/AA/ROM and strength, limited by pain; noted bil ankles swollen -- pt states this is typical RLE: Unable to fully assess due to pain       Communication Communication Communication: No difficulties   Cognition Arousal/Alertness: Awake/alert Behavior During Therapy: WFL for tasks assessed/performed Overall Cognitive Status: No family/caregiver present to determine baseline cognitive functioning                 General Comments: appropriate, follows commands   General Comments   pt very pleasant and cooperative                 Home Living Family/patient expects to be discharged to:: Skilled nursing facility                                 Additional Comments: Pt previously at SNF PTA and plans to return there to resume rehab      Prior Functioning/Environment Level of Independence: Independent with assistive device(s)                 OT Problem List: Decreased strength;Impaired balance (sitting and/or standing);Decreased cognition;Decreased knowledge of precautions;Pain;Decreased activity tolerance;Decreased knowledge of use of DME or AE   OT Treatment/Interventions: Self-care/ADL training;DME and/or AE instruction;Patient/family education;Therapeutic activities    OT Goals(Current goals can be found in the care plan section) Acute Rehab OT Goals Patient Stated Goal: get better OT Goal Formulation: With patient  OT Frequency: Min 2X/week   Barriers to D/C: Decreased caregiver support  will return to SNF       Co-evaluation PT/OT/SLP Co-Evaluation/Treatment: Yes Reason for Co-Treatment: For patient/therapist safety;To address functional/ADL transfers PT goals addressed during session: Mobility/safety with mobility OT goals addressed during session: ADL's and self-care;Proper use of Adaptive equipment and DME      End of Session Equipment Utilized During Treatment: Rolling walker;Gait  belt;Right knee immobilizer  Activity Tolerance: Patient limited by pain Patient left: in bed;with call bell/phone within reach;with nursing/sitter in room;in chair;with chair alarm set   Time: [id number] OT Time Calculation (min): 29 min Charges:  OT General Charges $OT Visit: 1 Procedure OT Evaluation $OT Eval Moderate Complexity: 1 Procedure G-Codes:    Galen ManilaSpencer, Crystal Ellwood Jeanette 12/23/2016, 1:51 PM

## 2016-12-23 NOTE — Consult Note (Signed)
Date of Admission:  12/22/2016  Date of Consult:  12/23/2016  Reason for Consult: Prosthetic hip infection Referring Physician:    HPI: Gabrielle [name] is an 83 y.o. female who has previously sustained a hip fracture in the left side status post surgery there now in late January sustained yet another fracture and is status post right hemi-me arthroplasty by Dr. Lorin Mercy on 12/06/2016. She presented to postop visit for routine follow-up and was found that generally increased drainage from her right hip. Exam she had swelling and redness around the hip and around the incision site. There is serosanguineous purulent drainage coming from the wound. She was taken to the operating room last night by Dr. Durward Fortes and underwent I&D. Cultures are growing what appears to be staph aureus per microbiology. He is currently on vancomycin and cefazolin.   Past Medical History:  Diagnosis Date  . Arthritis    oa  . Glaucoma 08/04/2015  . Hyperlipidemia   . Mild aortic insufficiency: Per 2 d echo 08/04/2015 08/04/2015  . Moderate mitral regurgitation: Per 2 d echo 08/04/2015 08/04/2015    Past Surgical History:  Procedure Laterality Date  . ABDOMINAL HYSTERECTOMY    . benign breast tumor removed  age 60  . HIP ARTHROPLASTY Left 08/31/2016   Procedure: ARTHROPLASTY  HIP (HEMIARTHROPLASTY);  Surgeon: Marybelle Killings, MD;  Location: [facility];  Service: Orthopedics;  Laterality: Left;  . HIP ARTHROPLASTY Right 12/06/2016   Procedure: ARTHROPLASTY BIPOLAR HIP (HEMIARTHROPLASTY);  Surgeon: Marybelle Killings, MD;  Location: [facility];  Service: Orthopedics;  Laterality: Right;  . INCISION AND DRAINAGE HIP Right 12/22/2016   Procedure: IRRIGATION AND DEBRIDEMENT HIP WITH ANTIBIOTIC BEAD APPLICATION;  Surgeon: Garald Balding, MD;  Location: [facility];  Service: Orthopedics;  Laterality: Right;  . TONSILLECTOMY  age 31  . TOTAL KNEE ARTHROPLASTY Right 11/21/2013   Procedure: RIGHT TOTAL KNEE ARTHROPLASTY;  Surgeon:  Gearlean Alf, MD;  Location: [facility];  Service: Orthopedics;  Laterality: Right;  . WRIST FRACTURE SURGERY Right     Social History:  reports that she quit smoking about 51 years ago. Her smoking use included Cigarettes. She has a 17.00 pack-year smoking history. She has never used smokeless tobacco. She reports that she drinks alcohol. She reports that she does not use drugs.   History reviewed. No pertinent family history.  No Known Allergies   Medications: I have reviewed patients current medications as documented in Epic Anti-infectives    Start     Dose/Rate Route Frequency Ordered Stop   12/23/16 1100  vancomycin (VANCOCIN) 500 mg in sodium chloride 0.9 % 100 mL IVPB     500 mg 100 mL/hr over 60 Minutes Intravenous Every 12 hours 12/22/16 2142     12/22/16 2200  ceFAZolin (ANCEF) IVPB 2g/100 mL premix     2 g 200 mL/hr over 30 Minutes Intravenous Every 6 hours 12/22/16 2106 12/24/16 2159   12/22/16 2200  vancomycin (VANCOCIN) IVPB 1000 mg/200 mL premix     1,000 mg 200 mL/hr over 60 Minutes Intravenous  Once 12/22/16 2142 12/22/16 2347   12/22/16 1744  ceFAZolin (ANCEF) 2-4 GM/100ML-% IVPB    Comments:  Everlean Cherry   : cabinet override      12/22/16 1744 12/23/16 0559         ROS:  as in HPI otherwise remainder of 12 point Review of Systems is not obtainable due to patient's confusion   Blood pressure Marland Kitchen)  84/45, pulse (!) 53, temperature 97.7 F (36.5 C), temperature source Oral, resp. rate 16, SpO2 100 %. General: Alert and awake, oriented  But with some confusion. HEENT: anicteric sclera,  EOMI, oropharynx clear and without exudate Cardiovascular: regular rate, normal r,  no murmur rubs or gallops Pulmonary: clear to auscultation bilaterally, no wheezing, rales or rhonchi Gastrointestinal: soft nontender, nondistended, normal bowel sounds, Musculoskeletal: Bandage in place  Neuro: nonfocal, strength and sensation intact   Results for orders placed or  performed during the hospital encounter of 12/22/16 (from the past 48 hour(s))  Comprehensive metabolic panel     Status: Abnormal   Collection Time: 12/22/16  2:32 PM  Result Value Ref Range   Sodium 136 135 - 145 mmol/L   Potassium 3.9 3.5 - 5.1 mmol/L   Chloride 97 (L) 101 - 111 mmol/L   CO2 29 22 - 32 mmol/L   Glucose, Bld 133 (H) 65 - 99 mg/dL   BUN 11 6 - 20 mg/dL   Creatinine, Ser 9.98 0.44 - 1.00 mg/dL   Calcium 9.8 8.9 - 83.7 mg/dL   Total Protein 7.1 6.5 - 8.1 g/dL   Albumin 2.9 (L) 3.5 - 5.0 g/dL   AST 20 15 - 41 U/L   ALT 21 14 - 54 U/L   Alkaline Phosphatase 64 38 - 126 U/L   Total Bilirubin 1.0 0.3 - 1.2 mg/dL   GFR calc non Af Amer >60 >60 mL/min   GFR calc Af Amer >60 >60 mL/min    Comment: (NOTE) The eGFR has been calculated using the CKD EPI equation. This calculation has not been validated in all clinical situations. eGFR's persistently <60 mL/min signify possible Chronic Kidney Disease.    Anion gap 10 5 - 15  CBC WITH DIFFERENTIAL     Status: Abnormal   Collection Time: 12/22/16  2:32 PM  Result Value Ref Range   WBC 14.5 (H) 4.0 - 10.5 K/uL   RBC 3.89 3.87 - 5.11 MIL/uL   Hemoglobin 11.4 (L) 12.0 - 15.0 g/dL   HCT 11.0 21.7 - 58.1 %   MCV 93.1 78.0 - 100.0 fL   MCH 29.3 26.0 - 34.0 pg   MCHC 31.5 30.0 - 36.0 g/dL   RDW 79.9 (H) 79.7 - 06.2 %   Platelets 394 150 - 400 K/uL   Neutrophils Relative % 87 %   Neutro Abs 12.6 (H) 1.7 - 7.7 K/uL   Lymphocytes Relative 5 %   Lymphs Abs 0.8 0.7 - 4.0 K/uL   Monocytes Relative 8 %   Monocytes Absolute 1.1 (H) 0.1 - 1.0 K/uL   Eosinophils Relative 0 %   Eosinophils Absolute 0.0 0.0 - 0.7 K/uL   Basophils Relative 0 %   Basophils Absolute 0.0 0.0 - 0.1 K/uL  APTT     Status: None   Collection Time: 12/22/16  2:32 PM  Result Value Ref Range   aPTT 30 24 - 36 seconds  Protime-INR     Status: Abnormal   Collection Time: 12/22/16  2:32 PM  Result Value Ref Range   Prothrombin Time 16.3 (H) 11.4 - 15.2  seconds   INR 1.30   Sedimentation rate     Status: Abnormal   Collection Time: 12/22/16  2:32 PM  Result Value Ref Range   Sed Rate 71 (H) 0 - 22 mm/hr  Urinalysis, Routine w reflex microscopic     Status: Abnormal   Collection Time: 12/22/16  3:05 PM  Result Value Ref  Range   Color, Urine YELLOW YELLOW   APPearance HAZY (A) CLEAR   Specific Gravity, Urine 1.016 1.005 - 1.030   pH 6.0 5.0 - 8.0   Glucose, UA NEGATIVE NEGATIVE mg/dL   Hgb urine dipstick NEGATIVE NEGATIVE   Bilirubin Urine NEGATIVE NEGATIVE   Ketones, ur NEGATIVE NEGATIVE mg/dL   Protein, ur NEGATIVE NEGATIVE mg/dL   Nitrite NEGATIVE NEGATIVE   Leukocytes, UA NEGATIVE NEGATIVE  Surgical pcr screen     Status: Abnormal   Collection Time: 12/22/16  3:26 PM  Result Value Ref Range   MRSA, PCR NEGATIVE NEGATIVE   Staphylococcus aureus POSITIVE (A) NEGATIVE    Comment:        The Xpert SA Assay (FDA approved for NASAL specimens in patients over 63 years of age), is one component of a comprehensive surveillance program.  Test performance has been validated by [facility] for patients greater than or equal to 81 year old. It is not intended to diagnose infection nor to guide or monitor treatment.   Aerobic Culture (superficial specimen)     Status: None (Preliminary result)   Collection Time: 12/22/16  6:33 PM  Result Value Ref Range   Specimen Description WOUND RIGHT HIP    Special Requests NONE    Gram Stain      FEW WBC PRESENT, PREDOMINANTLY PMN RARE GRAM POSITIVE COCCI IN PAIRS Gram Stain Report Called to,Read Back By and Verified With: V Joette Catching, RN 12/22/16 AT 2034 BY J. FUDESCO    Culture TOO YOUNG TO READ    Report Status PENDING   Aerobic/Anaerobic Culture (surgical/deep wound)     Status: None (Preliminary result)   Collection Time: 12/22/16  6:49 PM  Result Value Ref Range   Specimen Description WOUND RIGHT HIP    Special Requests NONE    Gram Stain      ABUNDANT WBC PRESENT,BOTH PMN AND  MONONUCLEAR FEW GRAM POSITIVE COCCI IN PAIRS    Culture TOO YOUNG TO READ    Report Status PENDING   Aerobic Culture (superficial specimen)     Status: None (Preliminary result)   Collection Time: 12/22/16  6:49 PM  Result Value Ref Range   Specimen Description WOUND RIGHT HIP    Special Requests NONE    Gram Stain      ABUNDANT WBC PRESENT,BOTH PMN AND MONONUCLEAR FEW GRAM POSITIVE COCCI IN PAIRS    Culture TOO YOUNG TO READ    Report Status PENDING   CBC     Status: Abnormal   Collection Time: 12/23/16  3:16 AM  Result Value Ref Range   WBC 14.2 (H) 4.0 - 10.5 K/uL   RBC 3.18 (L) 3.87 - 5.11 MIL/uL   Hemoglobin 9.1 (L) 12.0 - 15.0 g/dL    Comment: REPEATED TO VERIFY SPECIMEN CHECKED FOR CLOTS REPEATED TO VERIFY SPECIMEN CHECKED FOR CLOTS DELTA CHECK NOTED    HCT 29.5 (L) 36.0 - 46.0 %   MCV 92.8 78.0 - 100.0 fL   MCH 28.6 26.0 - 34.0 pg   MCHC 30.8 30.0 - 36.0 g/dL   RDW 26.0 (H) 00.0 - 86.3 %   Platelets 316 150 - 400 K/uL  Basic metabolic panel     Status: Abnormal   Collection Time: 12/23/16  3:16 AM  Result Value Ref Range   Sodium 134 (L) 135 - 145 mmol/L   Potassium 3.8 3.5 - 5.1 mmol/L   Chloride 98 (L) 101 - 111 mmol/L   CO2 28 22 -  32 mmol/L   Glucose, Bld 97 65 - 99 mg/dL   BUN 11 6 - 20 mg/dL   Creatinine, Ser 0.67 0.44 - 1.00 mg/dL   Calcium 8.8 (L) 8.9 - 10.3 mg/dL   GFR calc non Af Amer >60 >60 mL/min   GFR calc Af Amer >60 >60 mL/min    Comment: (NOTE) The eGFR has been calculated using the CKD EPI equation. This calculation has not been validated in all clinical situations. eGFR's persistently <60 mL/min signify possible Chronic Kidney Disease.    Anion gap 8 5 - 15  HIV antibody     Status: None   Collection Time: 12/23/16  3:16 AM  Result Value Ref Range   HIV Screen 4th Generation wRfx Non Reactive Non Reactive    Comment: (NOTE) Performed At: [facility] [address] [place], [place] [id number] Lindon Romp MD  [id number]    '@BRIEFLABTABLE'$ (sdes,specrequest,cult,reptstatus)   ) Recent Results (from the past 720 hour(s))  MRSA PCR Screening     Status: None   Collection Time: 12/06/16  3:34 AM  Result Value Ref Range Status   MRSA by PCR NEGATIVE NEGATIVE Final    Comment:        The GeneXpert MRSA Assay (FDA approved for NASAL specimens only), is one component of a comprehensive MRSA colonization surveillance program. It is not intended to diagnose MRSA infection nor to guide or monitor treatment for MRSA infections.   Surgical pcr screen     Status: Abnormal   Collection Time: 12/22/16  3:26 PM  Result Value Ref Range Status   MRSA, PCR NEGATIVE NEGATIVE Final   Staphylococcus aureus POSITIVE (A) NEGATIVE Final    Comment:        The Xpert SA Assay (FDA approved for NASAL specimens in patients over 33 years of age), is one component of a comprehensive surveillance program.  Test performance has been validated by [facility] for patients greater than or equal to 80 year old. It is not intended to diagnose infection nor to guide or monitor treatment.   Aerobic Culture (superficial specimen)     Status: None (Preliminary result)   Collection Time: 12/22/16  6:33 PM  Result Value Ref Range Status   Specimen Description WOUND RIGHT HIP  Final   Special Requests NONE  Final   Gram Stain   Final    FEW WBC PRESENT, PREDOMINANTLY PMN RARE GRAM POSITIVE COCCI IN PAIRS Gram Stain Report Called to,Read Back By and Verified With: V Weyman Croon, RN 12/22/16 AT 2034 BY J. FUDESCO    Culture TOO YOUNG TO READ  Final   Report Status PENDING  Incomplete  Aerobic/Anaerobic Culture (surgical/deep wound)     Status: None (Preliminary result)   Collection Time: 12/22/16  6:49 PM  Result Value Ref Range Status   Specimen Description WOUND RIGHT HIP  Final   Special Requests NONE  Final   Gram Stain   Final    ABUNDANT WBC PRESENT,BOTH PMN AND MONONUCLEAR FEW GRAM POSITIVE COCCI IN  PAIRS    Culture TOO YOUNG TO READ  Final   Report Status PENDING  Incomplete  Aerobic Culture (superficial specimen)     Status: None (Preliminary result)   Collection Time: 12/22/16  6:49 PM  Result Value Ref Range Status   Specimen Description WOUND RIGHT HIP  Final   Special Requests NONE  Final   Gram Stain   Final    ABUNDANT WBC PRESENT,BOTH PMN AND MONONUCLEAR FEW GRAM  POSITIVE COCCI IN PAIRS    Culture TOO YOUNG TO READ  Final   Report Status PENDING  Incomplete     Impression/Recommendation  Principal Problem:   Infection and inflammatory reaction due to internal right hip prosthesis, initial encounter ([place]) Active Problems:   Postoperative wound infection of right hip   S/P total hip arthroplasty   Gabrielle [name] is a 83 y.o. 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  --I will check sedimentation rate and C-reactive protein.     12/23/2016, 4:18 PM   Thank you so much for this interesting consult  [place] [facility] [place] [phone] (pager) [phone] (office) 12/23/2016, 4:18 PM  [place] 12/23/2016, 4:18 PM

## 2016-12-23 NOTE — Evaluation (Signed)
Physical Therapy Evaluation Patient Details Name: Gabrielle [name] MRN: [id number] DOB: Aug 07, 1927 Today's Date: 12/23/2016   History of Present Illness  Patient is status post right hip hemiarthroplasty performed by Dr. Annell Greening 12/06/2016. After admission was transferred to a skilled facility for rehabilitation.  Presented with R hip infection, now s/p I&D R hip; Post Prec, WBAT;  has a past medical history of Arthritis; Glaucoma (08/04/2015); Hyperlipidemia; Mild aortic insufficiency; and Moderate mitral regurgitation;  has a pertinent past surgical history that includes  Total knee arthroplasty (Right, 11/21/2013); Hip Arthroplasty (Left, 08/31/2016); and Hip Arthroplasty (Right, 12/06/2016).  Clinical Impression   Patient is s/p above surgery resulting in functional limitations due to the deficits listed below (see PT Problem List). Noting better activity tolerance than immediately after last surgery; Recommend return to SNF to resume rehab;   Patient will benefit from skilled PT to increase their independence and safety with mobility to allow discharge to the venue listed below.       Follow Up Recommendations SNF;Supervision/Assistance - 24 hour    Equipment Recommendations  Rolling walker with 5" wheels;3in1 (PT)    Recommendations for Other Services       Precautions / Restrictions Precautions Precautions: Fall;Posterior Hip Precaution Booklet Issued: Yes (comment) Precaution Comments: Educated pt in POsterior Precautions; anticipate the need to guard closely and reinforce post precautions Required Braces or Orthoses: Knee Immobilizer - Right Restrictions RLE Weight Bearing: Weight bearing as tolerated      Mobility  Bed Mobility Overal bed mobility: Needs Assistance Bed Mobility: Supine to Sit     Supine to sit: Max assist;+2 for physical assistance;+2 for safety/equipment     General bed mobility comments: multi-modal cues for technique, self assist and hip  precautions  Transfers Overall transfer level: Needs assistance Equipment used: Rolling walker (2 wheeled) Transfers: Sit to/from Stand Sit to Stand: Mod assist;+2 physical assistance         General transfer comment: cues for hand placement, RLE position and overall safety; Mod assist to power up, and to control descent to sit  Ambulation/Gait Ambulation/Gait assistance: Mod assist;+2 physical assistance;+2 safety/equipment Ambulation Distance (Feet): 5 Feet Assistive device: Rolling walker (2 wheeled) Gait Pattern/deviations: Step-to pattern;Trunk flexed     General Gait Details: Cues for gait sequence, and to support self on RW; Mod assist to advance RLE and stabilize RW  Stairs            Wheelchair Mobility    Modified Rankin (Stroke Patients Only)       Balance             Standing balance-Leahy Scale: Poor Standing balance comment: reliant on UEs and external assist                             Pertinent Vitals/Pain Pain Assessment: Faces Faces Pain Scale: Hurts worst Pain Location: right hip with any motion Pain Descriptors / Indicators: Grimacing;Guarding Pain Intervention(s): Monitored during session;Premedicated before session;Repositioned    Home Living Family/patient expects to be discharged to:: Skilled nursing facility                      Prior Function Level of Independence: Independent with assistive device(s) (prior to original fall approx 2 weeks ago)               Hand Dominance   Dominant Hand: Right    Extremity/Trunk Assessment   Upper Extremity Assessment  Upper Extremity Assessment: Defer to OT evaluation    Lower Extremity Assessment Lower Extremity Assessment: RLE deficits/detail RLE Deficits / Details: Grossly decr A/AA/ROM and strength, limited by pain; noted bil ankles swollen -- pt states this is typical RLE: Unable to fully assess due to pain       Communication   Communication: No  difficulties  Cognition Arousal/Alertness: Awake/alert Behavior During Therapy: WFL for tasks assessed/performed Overall Cognitive Status: No family/caregiver present to determine baseline cognitive functioning                 General Comments: appropriate, follows commands    General Comments      Exercises     Assessment/Plan    PT Assessment Patient needs continued PT services  PT Problem List Decreased strength;Decreased activity tolerance;Decreased mobility;Decreased balance;Decreased range of motion;Decreased cognition;Decreased knowledge of use of DME;Decreased knowledge of precautions          PT Treatment Interventions DME instruction;Gait training;Functional mobility training;Therapeutic activities;Therapeutic exercise;Patient/family education    PT Goals (Current goals can be found in the Care Plan section)  Acute Rehab PT Goals Patient Stated Goal: get better PT Goal Formulation: With patient/family Time For Goal Achievement: 12/30/16 Potential to Achieve Goals: Good    Frequency Min 3X/week   Barriers to discharge        Co-evaluation PT/OT/SLP Co-Evaluation/Treatment: Yes Reason for Co-Treatment: For patient/therapist safety;To address functional/ADL transfers PT goals addressed during session: Mobility/safety with mobility         End of Session Equipment Utilized During Treatment: Gait belt;Right knee immobilizer Activity Tolerance: Patient tolerated treatment well Patient left: in chair;with call bell/phone within reach;with chair alarm set Nurse Communication: Mobility status         Time: [id number] PT Time Calculation (min) (ACUTE ONLY): 29 min   Charges:   PT Evaluation $PT Eval Moderate Complexity: 1 Procedure     PT G Codes:        Levi AlandHolly H Malakhi Markwood 12/23/2016, 1:16 PM  Van ClinesHolly Allecia Bells, PT  Acute Rehabilitation Services Pager [phone] Office [phone]

## 2016-12-24 ENCOUNTER — Inpatient Hospital Stay (INDEPENDENT_AMBULATORY_CARE_PROVIDER_SITE_OTHER): Payer: Medicare Other | Admitting: Surgery

## 2016-12-24 DIAGNOSIS — Z96649 Presence of unspecified artificial hip joint: Secondary | ICD-10-CM

## 2016-12-24 DIAGNOSIS — T8459XA Infection and inflammatory reaction due to other internal joint prosthesis, initial encounter: Secondary | ICD-10-CM

## 2016-12-24 DIAGNOSIS — M00051 Staphylococcal arthritis, right hip: Secondary | ICD-10-CM

## 2016-12-24 LAB — BASIC METABOLIC PANEL
ANION GAP: 7 (ref 5–15)
BUN: 12 mg/dL (ref 6–20)
CALCIUM: 8.9 mg/dL (ref 8.9–10.3)
CO2: 31 mmol/L (ref 22–32)
CREATININE: 0.72 mg/dL (ref 0.44–1.00)
Chloride: 98 mmol/L — ABNORMAL LOW (ref 101–111)
GFR calc Af Amer: >60 mL/min (ref >60)
GFR calc non Af Amer: >60 mL/min (ref >60)
Glucose, Bld: 100 mg/dL — ABNORMAL HIGH (ref 65–99)
POTASSIUM: 4.8 mmol/L (ref 3.5–5.1)
SODIUM: 136 mmol/L (ref 135–145)

## 2016-12-24 LAB — URINE CULTURE: CULTURE: NO GROWTH

## 2016-12-24 LAB — HEPATITIS C ANTIBODY (REFLEX)

## 2016-12-24 LAB — CBC
HCT: 28.5 % — ABNORMAL LOW (ref 36.0–46.0)
Hemoglobin: 9 g/dL — ABNORMAL LOW (ref 12.0–15.0)
MCH: 29.6 pg (ref 26.0–34.0)
MCHC: 31.6 g/dL (ref 30.0–36.0)
MCV: 93.8 fL (ref 78.0–100.0)
PLATELETS: 290 10*3/uL (ref 150–400)
RBC: 3.04 MIL/uL — AB (ref 3.87–5.11)
RDW: 15.6 % — ABNORMAL HIGH (ref 11.5–15.5)
WBC: 10.3 10*3/uL (ref 4.0–10.5)

## 2016-12-24 LAB — ACID FAST SMEAR (AFB, MYCOBACTERIA)

## 2016-12-24 LAB — ACID FAST SMEAR (AFB): ACID FAST SMEAR - AFSCU2: NEGATIVE

## 2016-12-24 LAB — HEPATITIS B SURFACE ANTIGEN: Hepatitis B Surface Ag: NEGATIVE

## 2016-12-24 LAB — HCV COMMENT:

## 2016-12-24 MED ORDER — CEFAZOLIN SODIUM-DEXTROSE 2-4 GM/100ML-% IV SOLN
2.0000 g | Freq: Three times a day (TID) | INTRAVENOUS | Status: DC
  Administered 2016-12-24 – 2016-12-25 (×3): 2 g via INTRAVENOUS
  Filled 2016-12-24 (×4): qty 100

## 2016-12-24 MED ORDER — RIFAMPIN 300 MG PO CAPS
300.0000 mg | ORAL_CAPSULE | Freq: Two times a day (BID) | ORAL | Status: DC
  Administered 2016-12-24 – 2016-12-25 (×2): 300 mg via ORAL
  Filled 2016-12-24 (×2): qty 1

## 2016-12-24 NOTE — [place] FL2 (Signed)
[place] MEDICAID FL2 LEVEL OF CARE SCREENING TOOL     IDENTIFICATION  Patient Name: Gabrielle LeanBarbara A Cashin Birthdate: 1926-11-12 Sex: female Admission Date (Current Location): 12/22/2016  [facility] [place]   Facility and Address:  [facility], [address], [place], [place] [address]      Provider Number: [id number]  Attending Physician Name and Address:  Valeria BatmanPeter W Whitfield, MD  Relative Name and Phone Number:       Current Level of Care: Hospital Recommended Level of Care: Skilled Nursing Facility Prior Approval Number:    Date Approved/Denied:   PASRR Number:    Discharge Plan: SNF    Current Diagnoses: Patient Active Problem List   Diagnosis Date Noted  . Prosthetic hip infection (HCC)   . Staphylococcus aureus infection   . Alzheimer's dementia with behavioral disturbance   . Postoperative wound infection of right hip 12/22/2016  . Infection and inflammatory reaction due to internal right hip prosthesis, initial encounter (HCC) 12/22/2016  . S/P total hip arthroplasty 12/22/2016  . SOB (shortness of breath)   . Influenza A 12/06/2016  . Closed fracture of right hip (HCC) 12/05/2016  . Dementia 12/05/2016  . Fall 12/05/2016  . Cough 12/05/2016  . S/P hip hemiarthroplasty 09/16/2016  . Mallet finger, left small finger 09/16/2016  . Hip fracture (HCC) 08/30/2016  . Vascular dementia 08/30/2016  . Osteoporosis 08/30/2016  . Vaginal candida 08/30/2016  . UTI (urinary tract infection) 08/30/2016  . Mild aortic insufficiency: Per 2 d echo 08/04/2015 08/04/2015  . Moderate mitral regurgitation: Per 2 d echo 08/04/2015 08/04/2015  . Glaucoma 08/04/2015  . Dizziness 08/04/2015  . Syncope 08/03/2015  . Depression 12/09/2013  . Constipation 11/30/2013  . Pure hypercholesterolemia 11/30/2013  . Acute posthemorrhagic anemia 11/30/2013  . OA (osteoarthritis) of knee 11/21/2013    Orientation RESPIRATION BLADDER Height &  Weight     Self, Situation, Place  O2 (2L [place]) Incontinent Weight:   Height:     BEHAVIORAL SYMPTOMS/MOOD NEUROLOGICAL BOWEL NUTRITION STATUS      Continent    AMBULATORY STATUS COMMUNICATION OF NEEDS Skin   Extensive Assist Verbally Surgical wounds                       Personal Care Assistance Level of Assistance  Bathing, Dressing Bathing Assistance: Limited assistance   Dressing Assistance: Limited assistance     Functional Limitations Info             SPECIAL CARE FACTORS FREQUENCY  PT (By licensed PT), OT (By licensed OT)     PT Frequency: 5/wk OT Frequency: 5/wk            Contractures      Additional Factors Info  Code Status, Allergies, Psychotropic Code Status Info: DNR Allergies Info: NKA Psychotropic Info: celexa         Current Medications (12/24/2016):  This is the current hospital active medication list Current Facility-Administered Medications  Medication Dose Route Frequency Provider Last Rate Last Dose  . albuterol (PROVENTIL) (2.5 MG/3ML) 0.083% nebulizer solution 2.5 mg  2.5 mg Nebulization Q6H PRN Jetty PeeksBrian D Petrarca, PA-C      . alum & mag hydroxide-simeth (MAALOX/MYLANTA) 200-200-20 MG/5ML suspension 30 mL  30 mL Oral Q4H PRN Jetty PeeksBrian D Petrarca, PA-C      . aspirin EC tablet 325 mg  325 mg Oral Q breakfast Jetty PeeksBrian D Petrarca, PA-C   325 mg at 12/23/16  1610  . atorvastatin (LIPITOR) tablet 20 mg  20 mg Oral q1800 Oris Drone Petrarca, PA-C   20 mg at 12/23/16 1722  . bisacodyl (DULCOLAX) suppository 10 mg  10 mg Rectal Daily PRN Jetty Peeks, PA-C      . calcium-vitamin D (OSCAL WITH D) 500-200 MG-UNIT per tablet 1 tablet  1 tablet Oral BID Valeria Batman, MD   1 tablet at 12/23/16 2053  . ceFAZolin (ANCEF) IVPB 2g/100 mL premix  2 g Intravenous Q6H Oris Drone Petrarca, PA-C   2 g at 12/24/16 0301  . Chlorhexidine Gluconate Cloth 2 % PADS 6 each  6 each Topical Daily Valeria Batman, MD   6 each at 12/23/16 1336  . cholecalciferol (VITAMIN  D) tablet 4,000 Units  4,000 Units Oral QHS Jetty Peeks, PA-C   4,000 Units at 12/23/16 2050  . citalopram (CELEXA) tablet 20 mg  20 mg Oral QHS Oris Drone Petrarca, PA-C   20 mg at 12/23/16 2053  . dextromethorphan-guaiFENesin (MUCINEX DM) 30-600 MG per 12 hr tablet 1 tablet  1 tablet Oral BID Jetty Peeks, PA-C   1 tablet at 12/23/16 2049  . diphenhydrAMINE (BENADRYL) 12.5 MG/5ML elixir 12.5-25 mg  12.5-25 mg Oral Q4H PRN Jetty Peeks, PA-C      . docusate sodium (COLACE) capsule 100 mg  100 mg Oral BID Jetty Peeks, PA-C   100 mg at 12/23/16 2052  . donepezil (ARICEPT) tablet 10 mg  10 mg Oral QHS Jetty Peeks, PA-C   10 mg at 12/23/16 2052  . feeding supplement (ENSURE ENLIVE) (ENSURE ENLIVE) liquid 237 mL  237 mL Oral BID BM Oris Drone Petrarca, PA-C   237 mL at 12/23/16 1449  . HYDROcodone-acetaminophen (NORCO/VICODIN) 5-325 MG per tablet 1 tablet  1 tablet Oral Q6H PRN Jetty Peeks, PA-C   1 tablet at 12/24/16 0730  . magnesium citrate solution 1 Bottle  1 Bottle Oral Once PRN Jetty Peeks, PA-C      . memantine [facility]) tablet 5 mg  5 mg Oral QHS Oris Drone Petrarca, PA-C   5 mg at 12/23/16 2049  . menthol-cetylpyridinium (CEPACOL) lozenge 3 mg  1 lozenge Oral PRN Oris Drone Petrarca, PA-C       Or  . phenol (CHLORASEPTIC) mouth spray 1 spray  1 spray Mouth/Throat PRN Jetty Peeks, PA-C      . metoCLOPramide (REGLAN) tablet 5-10 mg  5-10 mg Oral Q8H PRN Jetty Peeks, PA-C       Or  . metoCLOPramide (REGLAN) injection 5-10 mg  5-10 mg Intravenous Q8H PRN Jetty Peeks, PA-C      . multivitamin (PROSIGHT) tablet 1 tablet  1 tablet Oral QHS Valeria Batman, MD   1 tablet at 12/23/16 2052  . mupirocin ointment (BACTROBAN) 2 % 1 application  1 application Nasal BID Valeria Batman, MD   1 application at 12/23/16 2048  . omega-3 acid ethyl esters (LOVAZA) capsule 1 g  1 g Oral Daily Oris Drone Petrarca, PA-C   1 g at 12/23/16 1028  . ondansetron (ZOFRAN) tablet 4 mg   4 mg Oral Q6H PRN Jetty Peeks, PA-C       Or  . ondansetron (ZOFRAN) injection 4 mg  4 mg Intravenous Q6H PRN Oris Drone Petrarca, PA-C      . polyethylene glycol (MIRALAX / GLYCOLAX) packet 17 g  17 g Oral Daily PRN Jetty Peeks, PA-C      .  predniSONE (DELTASONE) tablet 20 mg  20 mg Oral Q breakfast Jetty Peeks, PA-C   20 mg at 12/23/16 1610  . timolol (TIMOPTIC) 0.25 % ophthalmic solution 1 drop  1 drop Both Eyes BID Valeria Batman, MD   1 drop at 12/23/16 2048  . vancomycin (VANCOCIN) 500 mg in sodium chloride 0.9 % 100 mL IVPB  500 mg Intravenous Q12H Ann Held, RPH   500 mg at 12/23/16 2216     Discharge Medications: Please see discharge summary for a list of discharge medications.  Relevant Imaging Results:  Relevant Lab Results:   Additional Information SSN:  [id number]  Burna Sis, LCSW

## 2016-12-24 NOTE — Progress Notes (Signed)
Subjective: No new complaints   Antibiotics:  Anti-infectives    Start     Dose/Rate Route Frequency Ordered Stop   12/23/16 1100  vancomycin (VANCOCIN) 500 mg in sodium chloride 0.9 % 100 mL IVPB     500 mg 100 mL/hr over 60 Minutes Intravenous Every 12 hours 12/22/16 2142     12/22/16 2200  ceFAZolin (ANCEF) IVPB 2g/100 mL premix     2 g 200 mL/hr over 30 Minutes Intravenous Every 6 hours 12/22/16 2106 12/24/16 2159   12/22/16 2200  vancomycin (VANCOCIN) IVPB 1000 mg/200 mL premix     1,000 mg 200 mL/hr over 60 Minutes Intravenous  Once 12/22/16 2142 12/22/16 2347   12/22/16 1744  ceFAZolin (ANCEF) 2-4 GM/100ML-% IVPB    Comments:  Gabrielle [name]   : cabinet override      12/22/16 1744 12/23/16 0559      Medications: Scheduled Meds: . aspirin EC  325 mg Oral Q breakfast  . atorvastatin  20 mg Oral q1800  . calcium-vitamin D  1 tablet Oral BID  .  ceFAZolin (ANCEF) IV  2 g Intravenous Q6H  . Chlorhexidine Gluconate Cloth  6 each Topical Daily  . cholecalciferol  4,000 Units Oral QHS  . citalopram  20 mg Oral QHS  . dextromethorphan-guaiFENesin  1 tablet Oral BID  . docusate sodium  100 mg Oral BID  . donepezil  10 mg Oral QHS  . feeding supplement (ENSURE ENLIVE)  237 mL Oral BID BM  . memantine  5 mg Oral QHS  . multivitamin  1 tablet Oral QHS  . mupirocin ointment  1 application Nasal BID  . omega-3 acid ethyl esters  1 g Oral Daily  . predniSONE  20 mg Oral Q breakfast  . timolol  1 drop Both Eyes BID  . vancomycin  500 mg Intravenous Q12H   Continuous Infusions: PRN Meds:.albuterol, alum & mag hydroxide-simeth, bisacodyl, diphenhydrAMINE, HYDROcodone-acetaminophen, magnesium citrate, menthol-cetylpyridinium **OR** phenol, metoCLOPramide **OR** metoCLOPramide (REGLAN) injection, ondansetron **OR** ondansetron (ZOFRAN) IV, polyethylene glycol    Objective: Weight change:   Intake/Output Summary (Last 24 hours) at 12/24/16 1224 Last data filed at  12/24/16 1055  Gross per 24 hour  Intake              480 ml  Output              750 ml  Net             -270 ml   Blood pressure (!) 118/48, pulse 64, temperature 98.1 F (36.7 C), temperature source Oral, resp. rate 16, SpO2 100 %. Temp:  [97.7 F (36.5 C)-98.5 F (36.9 C)] 98.1 F (36.7 C) (02/14 0400) Pulse Rate:  [53-66] 64 (02/14 0549) Resp:  [16] 16 (02/13 1447) BP: (84-118)/(45-52) 118/48 (02/14 0549) SpO2:  [100 %] 100 % (02/14 0400)  Physical Exam: General: Alert and awake, oriented  But with some confusion. HEENT: anicteric sclera Cardiovascular: regular rate, normal r  Pulmonary:  no wheezing,  Gastrointestinal: soft  nondistended Musculoskeletal: Bandage in place  Neuro: nonfocal, strength and sensation intact   CBC: CBC Latest Ref Rng & Units 12/24/2016 12/23/2016 12/22/2016  WBC 4.0 - 10.5 K/uL 10.3 14.2(H) 14.5(H)  Hemoglobin 12.0 - 15.0 g/dL 9.0(L) 9.1(L) 11.4(L)  Hematocrit 36.0 - 46.0 % 28.5(L) 29.5(L) 36.2  Platelets 150 - 400 K/uL 290 316 394      BMET  Recent Labs  12/23/16 0316 12/24/16 0356  NA 134* 136  K 3.8 4.8  CL 98* 98*  CO2 28 31  GLUCOSE 97 100*  BUN 11 12  CREATININE 0.67 0.72  CALCIUM 8.8* 8.9     Liver Panel   Recent Labs  12/22/16 1432  PROT 7.1  ALBUMIN 2.9*  AST 20  ALT 21  ALKPHOS 64  BILITOT 1.0       Sedimentation Rate  Recent Labs  12/22/16 1432  ESRSEDRATE 71*   C-Reactive Protein No results for input(s): CRP in the last 72 hours.  Micro Results: Recent Results (from the past 720 hour(s))  MRSA PCR Screening     Status: None   Collection Time: 12/06/16  3:34 AM  Result Value Ref Range Status   MRSA by PCR NEGATIVE NEGATIVE Final    Comment:        The GeneXpert MRSA Assay (FDA approved for NASAL specimens only), is one component of a comprehensive MRSA colonization surveillance program. It is not intended to diagnose MRSA infection nor to guide or monitor treatment for MRSA  infections.   Surgical pcr screen     Status: Abnormal   Collection Time: 12/22/16  3:26 PM  Result Value Ref Range Status   MRSA, PCR NEGATIVE NEGATIVE Final   Staphylococcus aureus POSITIVE (A) NEGATIVE Final    Comment:        The Xpert SA Assay (FDA approved for NASAL specimens in patients over 34 years of age), is one component of a comprehensive surveillance program.  Test performance has been validated by [facility] for patients greater than or equal to 13 year old. It is not intended to diagnose infection nor to guide or monitor treatment.   Aerobic Culture (superficial specimen)     Status: None (Preliminary result)   Collection Time: 12/22/16  6:33 PM  Result Value Ref Range Status   Specimen Description WOUND RIGHT HIP  Final   Special Requests NONE  Final   Gram Stain   Final    FEW WBC PRESENT, PREDOMINANTLY PMN RARE GRAM POSITIVE COCCI IN PAIRS Gram Stain Report Called to,Read Back By and Verified With: Hassel Neth, RN 12/22/16 AT 2034 BY J. FUDESCO    Culture TOO YOUNG TO READ  Final   Report Status PENDING  Incomplete  Acid Fast Smear (AFB)     Status: None   Collection Time: 12/22/16  6:33 PM  Result Value Ref Range Status   AFB Specimen Processing Concentration  Final   Acid Fast Smear Negative  Final    Comment: (NOTE) Performed At: [facility] [address] [place], [place] [id number] Mila Homer MD WU:[id number]    Source (AFB) WOUND  Final    Comment: RIGHT HIP   Anaerobic culture     Status: None (Preliminary result)   Collection Time: 12/22/16  6:49 PM  Result Value Ref Range Status   Specimen Description WOUND RIGHT HIP  Final   Special Requests NONE  Final   Culture   Final    NO ANAEROBES ISOLATED; CULTURE IN PROGRESS FOR 5 DAYS   Report Status PENDING  Incomplete  Aerobic/Anaerobic Culture (surgical/deep wound)     Status: None (Preliminary result)   Collection Time: 12/22/16  6:49 PM  Result Value Ref Range Status    Specimen Description WOUND RIGHT HIP  Final   Special Requests NONE  Final   Gram Stain   Final    ABUNDANT WBC PRESENT,BOTH PMN AND MONONUCLEAR FEW GRAM POSITIVE COCCI IN  PAIRS    Culture TOO YOUNG TO READ  Final   Report Status PENDING  Incomplete  Aerobic Culture (superficial specimen)     Status: None (Preliminary result)   Collection Time: 12/22/16  6:49 PM  Result Value Ref Range Status   Specimen Description WOUND RIGHT HIP  Final   Special Requests NONE  Final   Gram Stain   Final    ABUNDANT WBC PRESENT,BOTH PMN AND MONONUCLEAR FEW GRAM POSITIVE COCCI IN PAIRS    Culture TOO YOUNG TO READ  Final   Report Status PENDING  Incomplete  Urine culture     Status: None   Collection Time: 12/22/16  8:03 PM  Result Value Ref Range Status   Specimen Description URINE, CATHETERIZED  Final   Special Requests NONE  Final   Culture NO GROWTH  Final   Report Status 12/24/2016 FINAL  Final    Studies/Results: Dg Pelvis Portable  Result Date: 12/22/2016 CLINICAL DATA:  Status post debridement of right hip. Assess for dislocation. Initial encounter. EXAM: PORTABLE PELVIS 1-2 VIEWS COMPARISON:  Right hip radiographs performed earlier today at 11:21 a.m. FINDINGS: There is no evidence of dislocation at this time. Both hip arthroplasties are grossly unremarkable in appearance, without evidence of loosening. There is no evidence of fracture. Postoperative air is seen about the right hip, with associated drainage catheter. IMPRESSION: No evidence of dislocation at this time. Bilateral hip arthroplasties are unremarkable in appearance. Electronically Signed   By: Roanna RaiderJeffery  Chang M.D.   On: 12/22/2016 20:44   Dg Chest Port 1 View  Result Date: 12/22/2016 CLINICAL DATA:  Preop testing right hip surgery EXAM: PORTABLE CHEST 1 VIEW COMPARISON:  12/09/2016 FINDINGS: Lungs remain clear without infiltrate or effusion. Negative for heart failure. Atherosclerotic calcification aortic arch. Marked  dextroscoliosis thoracic spine unchanged. IMPRESSION: No active disease. Electronically Signed   By: Marlan Palauharles  Clark M.D.   On: 12/22/2016 14:16   Dg Hip Port Unilat With Pelvis 1v Right  Result Date: 12/22/2016 CLINICAL DATA:  Status post debridement of right hip. Assess for dislocation. Initial encounter. EXAM: DG HIP (WITH OR WITHOUT PELVIS) 1V PORT RIGHT COMPARISON:  Right hip radiographs performed earlier today at 11:21 a.m. FINDINGS: There is no evidence of dislocation on the provided cross-table lateral view. There is no evidence of loosening. A drainage catheter is noted overlying the right hip. The right hip arthroplasty is grossly unremarkable in appearance. IMPRESSION: No evidence of dislocation. Right hip arthroplasty is grossly unremarkable in appearance. Electronically Signed   By: Roanna RaiderJeffery  Chang M.D.   On: 12/22/2016 20:45      Assessment/Plan:  INTERVAL HISTORY: likely staph aureus growing per micro yesterday   Principal Problem:   Infection and inflammatory reaction due to internal right hip prosthesis, initial encounter (HCC) Active Problems:   Postoperative wound infection of right hip   S/P total hip arthroplasty   Prosthetic hip infection (HCC)   Staphylococcus aureus infection   Alzheimer's dementia with behavioral disturbance    Gabrielle [name] is a 83 y.o. 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     LOS: 2 days   Acey Lav 12/24/2016, 12:24 PM

## 2016-12-24 NOTE — Progress Notes (Signed)
Physical Therapy Treatment Note  Clinical Impression: Session focused on functional mobility and working on progressive amb; Still needing considerable assist to get up, steady while in standing, and advance RLE;   Symptomatic for dizziness with upright activity today; noted in chart review similar response to being upright after initial surgery; Recommend running Orthostatics next session    12/24/16 1200  PT Visit Information  Last PT Received On 12/24/16  Assistance Needed +2  History of Present Illness Patient is status post right hip hemiarthroplasty performed by Dr. Annell Greening 12/06/2016. After admission was transferred to a skilled facility for rehabilitation.  Presented with R hip infection, now s/p I&D R hip; Post Prec, WBAT;  has a past medical history of Arthritis; Glaucoma (08/04/2015); Hyperlipidemia; Mild aortic insufficiency; and Moderate mitral regurgitation;  has a pertinent past surgical history that includes  Total knee arthroplasty (Right, 11/21/2013); Hip Arthroplasty (Left, 08/31/2016); and Hip Arthroplasty (Right, 12/06/2016).  Subjective Data  Patient Stated Goal get better  Precautions  Precautions Fall;Posterior Hip  Precaution Booklet Issued Yes (comment)  Precaution Comments Educated pt in Posterior Precautions; anticipate the need to guard closely and reinforce post precautions  Required Braces or Orthoses Knee Immobilizer - Right  Restrictions  RLE Weight Bearing WBAT  Pain Assessment  Pain Assessment Faces  Faces Pain Scale 8  Pain Location R hip with mobility  Pain Descriptors / Indicators Grimacing;Guarding;Moaning  Pain Intervention(s) Monitored during session;Limited activity within patient's tolerance  Cognition  Arousal/Alertness Awake/alert  Behavior During Therapy WFL for tasks assessed/performed  Overall Cognitive Status No family/caregiver present to determine baseline cognitive functioning  General Comments appropriate, follows commands; no  evidence of carryover from last session  Bed Mobility  Overal bed mobility Needs Assistance  Bed Mobility Supine to Sit  Supine to sit Mod assist;+2 for physical assistance  General bed mobility comments multi-modal cues for technique, self assist and hip precautions  Transfers  Overall transfer level Needs assistance  Equipment used Rolling walker (2 wheeled)  Transfers Sit to/from Stand  Sit to Stand Mod assist;+2 physical assistance  General transfer comment cues for hand placement, RLE position and overall safety; Mod assist to power up, and to control descent to sit  Ambulation/Gait  Ambulation/Gait assistance Mod assist;+2 physical assistance;+2 safety/equipment  Ambulation Distance (Feet) 5 Feet  Assistive device Rolling walker (2 wheeled)  Gait Pattern/deviations Step-to pattern;Trunk flexed  General Gait Details Cues for gait sequence, and to support self on RW; Mod assist to advance RLE and stabilize RW; Sat early due to pt being symptomatic for dizziness  Balance  Sitting balance-Leahy Scale Fair  Standing balance-Leahy Scale Poor  Standing balance comment reliant on UEs and external assist  PT - End of Session  Equipment Utilized During Treatment Gait belt;Right knee immobilizer  Activity Tolerance Other (comment) (Limited by dizziness in standing)  Patient left in chair;with call bell/phone within reach;with chair alarm set  Nurse Communication Mobility status  PT - Assessment/Plan  PT Plan Current plan remains appropriate  PT Frequency (ACUTE ONLY) Min 3X/week  Follow Up Recommendations SNF;Supervision/Assistance - 24 hour  PT equipment Rolling walker with 5" wheels;3in1 (PT)  PT Goal Progression  Progress towards PT goals Progressing toward goals (slowly)  Acute Rehab PT Goals  PT Goal Formulation With patient/family  Time For Goal Achievement 12/30/16  Potential to Achieve Goals Good  PT Time Calculation  PT Start Time (ACUTE ONLY) 1029  PT Stop Time (ACUTE  ONLY) 1055  PT Time Calculation (min) (ACUTE ONLY)  26 min  PT General Charges  $$ ACUTE PT VISIT 1 Procedure  PT Treatments  $Gait Training 8-22 mins  $Therapeutic Activity 8-22 mins   Van ClinesHolly Dorothe [place]more, [place]  Acute Rehabilitation Services Pager [phone] Office [phone]

## 2016-12-24 NOTE — Progress Notes (Signed)
PATIENT ID: Gabrielle LeanBarbara A Thalmann        MRN:  [id number]          DOB/AGE: [birth year] / 83 y.o.    Gabrielle CampbellPeter Owen Pratte, MD   Gabrielle CodeBrian Petrarca, PA-C [address] [place], [place]  [id number]                             [phone]   PROGRESS NOTE  Subjective:  negative for Chest Pain  negative for Shortness of Breath  negative for Nausea/Vomiting   negative for Calf Pain    Tolerating Diet: yes         Patient reports pain as mild.     Experiencing LBP this am only with movement-no percussible tenderness or referred pain to LE's  Objective: Vital signs in last 24 hours:   Patient Vitals for the past 24 hrs:  BP Temp Temp src Pulse Resp SpO2  12/24/16 0549 (!) 118/48 - - 64 - -  12/24/16 0400 (!) 118/48 98.1 F (36.7 C) Oral 66 - 100 %  12/24/16 0055 (!) 96/52 - - 63 - -  12/23/16 2100 - 98.5 F (36.9 C) Oral (!) 55 - -  12/23/16 1447 (!) 84/45 97.7 F (36.5 C) Oral (!) 53 16 100 %  12/23/16 0816 (!) 91/42 98.1 F (36.7 C) Oral 61 - 100 %      Intake/Output from previous day:   02/13 0701 - 02/14 0700 In: 477 [P.O.:477] Out: 1010 [Urine:910; Drains:100]   Intake/Output this shift:   02/14 0701 - 02/14 1900 In: -  Out: 50 [Drains:50]   Intake/Output      02/13 0701 - 02/14 0700 02/14 0701 - 02/15 0700   P.O. 477    I.V.     Total Intake 477     Urine 910    Drains 100 50   Blood     Total Output 1010 50   Net -533 -50           LABORATORY DATA:  Recent Labs  12/22/16 1432 12/23/16 0316 12/24/16 0356  WBC 14.5* 14.2* 10.3  HGB 11.4* 9.1* 9.0*  HCT 36.2 29.5* 28.5*  PLT 394 316 290    Recent Labs  12/22/16 1432 12/23/16 0316 12/24/16 0356  NA 136 134* 136  K 3.9 3.8 4.8  CL 97* 98* 98*  CO2 29 28 31   BUN 11 11 12   CREATININE 0.77 0.67 0.72  GLUCOSE 133* 97 100*  CALCIUM 9.8 8.8* 8.9   Lab Results  Component Value Date   INR 1.30 12/22/2016   INR 1.10 12/05/2016   INR 1.08 08/30/2016    Recent Radiographic Studies :  Dg Chest 1  View  Result Date: 12/05/2016 CLINICAL DATA:  Preoperative chest radiograph, for hip fracture. Cough. Initial encounter. EXAM: CHEST 1 VIEW COMPARISON:  Chest radiograph performed 08/30/2016 FINDINGS: The lungs are well-aerated and clear. There is no evidence of focal opacification, pleural effusion or pneumothorax. The cardiomediastinal silhouette is borderline normal in size. No acute osseous abnormalities are seen. Right convex thoracic scoliosis is noted. IMPRESSION: No acute cardiopulmonary process seen. Right convex thoracic scoliosis noted. No displaced rib fractures identified. Electronically Signed   By: Roanna RaiderJeffery  Chang M.D.   On: 12/05/2016 19:13   Ct Head Wo Contrast  Result Date: 12/05/2016 CLINICAL DATA:  Dizziness with subsequent fall. EXAM: CT HEAD WITHOUT CONTRAST CT CERVICAL SPINE WITHOUT CONTRAST TECHNIQUE: Multidetector CT imaging of  the head and cervical spine was performed following the standard protocol without intravenous contrast. Multiplanar CT image reconstructions of the cervical spine were also generated. COMPARISON:  08/03/2015 FINDINGS: CT HEAD FINDINGS Brain: Ventricles and cisterns are within normal. There is mild age related atrophic change. There is no mass, mass effect, shift of midline structures or acute hemorrhage. No evidence of acute infarction. Minimal chronic ischemic microvascular disease. Vascular: Mild plaque over the cavernous segment of the internal carotid arteries. Skull: Within normal. Sinuses/Orbits: Orbits are within normal. Paranasal sinuses are well developed and well aerated without air-fluid levels or significant opacification. Other: None. CT CERVICAL SPINE FINDINGS Alignment:  Within normal. Skull base and vertebrae: Vertebral body alignment and heights are within normal. There is mild spondylosis throughout the cervical spine. There is moderate uncovertebral joint spurring and mild facet arthropathy. No acute fracture. Soft tissues and spinal canal:  Prevertebral soft tissues are within normal. Spinal canal is within normal. Disc levels: Mild disc space narrowing at the C3-4, C4-5, C5-6 and C6-7 levels. Upper chest: Within normal. Other: None. IMPRESSION: No acute intracranial findings. Minimal chronic ischemic microvascular disease. No acute cervical spine injury. Mild spondylosis throughout the cervical spine with mild multilevel disc disease. Electronically Signed   By: Elberta Fortis M.D.   On: 12/05/2016 19:02   Ct Cervical Spine Wo Contrast  Result Date: 12/05/2016 CLINICAL DATA:  Dizziness with subsequent fall. EXAM: CT HEAD WITHOUT CONTRAST CT CERVICAL SPINE WITHOUT CONTRAST TECHNIQUE: Multidetector CT imaging of the head and cervical spine was performed following the standard protocol without intravenous contrast. Multiplanar CT image reconstructions of the cervical spine were also generated. COMPARISON:  08/03/2015 FINDINGS: CT HEAD FINDINGS Brain: Ventricles and cisterns are within normal. There is mild age related atrophic change. There is no mass, mass effect, shift of midline structures or acute hemorrhage. No evidence of acute infarction. Minimal chronic ischemic microvascular disease. Vascular: Mild plaque over the cavernous segment of the internal carotid arteries. Skull: Within normal. Sinuses/Orbits: Orbits are within normal. Paranasal sinuses are well developed and well aerated without air-fluid levels or significant opacification. Other: None. CT CERVICAL SPINE FINDINGS Alignment:  Within normal. Skull base and vertebrae: Vertebral body alignment and heights are within normal. There is mild spondylosis throughout the cervical spine. There is moderate uncovertebral joint spurring and mild facet arthropathy. No acute fracture. Soft tissues and spinal canal: Prevertebral soft tissues are within normal. Spinal canal is within normal. Disc levels: Mild disc space narrowing at the C3-4, C4-5, C5-6 and C6-7 levels. Upper chest: Within normal.  Other: None. IMPRESSION: No acute intracranial findings. Minimal chronic ischemic microvascular disease. No acute cervical spine injury. Mild spondylosis throughout the cervical spine with mild multilevel disc disease. Electronically Signed   By: Elberta Fortis M.D.   On: 12/05/2016 19:02   Dg Pelvis Portable  Result Date: 12/22/2016 CLINICAL DATA:  Status post debridement of right hip. Assess for dislocation. Initial encounter. EXAM: PORTABLE PELVIS 1-2 VIEWS COMPARISON:  Right hip radiographs performed earlier today at 11:21 a.m. FINDINGS: There is no evidence of dislocation at this time. Both hip arthroplasties are grossly unremarkable in appearance, without evidence of loosening. There is no evidence of fracture. Postoperative air is seen about the right hip, with associated drainage catheter. IMPRESSION: No evidence of dislocation at this time. Bilateral hip arthroplasties are unremarkable in appearance. Electronically Signed   By: Roanna Raider M.D.   On: 12/22/2016 20:44   Pelvis Portable  Result Date: 12/06/2016 CLINICAL DATA:  Status post  right hip arthroplasty. EXAM: PORTABLE PELVIS 1-2 VIEWS COMPARISON:  08/31/2016 FINDINGS: New right hip hemiarthroplasty appears well-seated and aligned. Left hemiarthroplasty is stable, also well-seated and aligned. No acute fracture or evidence of an operative complication. IMPRESSION: Well-positioned right hip hemiarthroplasty. Electronically Signed   By: Amie Portland M.D.   On: 12/06/2016 09:50   Dg Chest Port 1 View  Result Date: 12/22/2016 CLINICAL DATA:  Preop testing right hip surgery EXAM: PORTABLE CHEST 1 VIEW COMPARISON:  12/09/2016 FINDINGS: Lungs remain clear without infiltrate or effusion. Negative for heart failure. Atherosclerotic calcification aortic arch. Marked dextroscoliosis thoracic spine unchanged. IMPRESSION: No active disease. Electronically Signed   By: Marlan Palau M.D.   On: 12/22/2016 14:16   Dg Chest Port 1 View  Result  Date: 12/09/2016 CLINICAL DATA:  Shortness of breath, cough EXAM: PORTABLE CHEST 1 VIEW COMPARISON:  12/05/2016 FINDINGS: There is hyperinflation of the lungs compatible with COPD. Left lower lobe atelectasis. Right lung is clear. No effusions. Heart is mildly enlarged. Tortuosity of the thoracic aorta with calcifications. Severe thoracolumbar scoliosis. IMPRESSION: COPD.  Left base atelectasis. Electronically Signed   By: Charlett Nose M.D.   On: 12/09/2016 12:41   Dg Hip Port Unilat With Pelvis 1v Right  Result Date: 12/22/2016 CLINICAL DATA:  Status post debridement of right hip. Assess for dislocation. Initial encounter. EXAM: DG HIP (WITH OR WITHOUT PELVIS) 1V PORT RIGHT COMPARISON:  Right hip radiographs performed earlier today at 11:21 a.m. FINDINGS: There is no evidence of dislocation on the provided cross-table lateral view. There is no evidence of loosening. A drainage catheter is noted overlying the right hip. The right hip arthroplasty is grossly unremarkable in appearance. IMPRESSION: No evidence of dislocation. Right hip arthroplasty is grossly unremarkable in appearance. Electronically Signed   By: Roanna Raider M.D.   On: 12/22/2016 20:45   Dg Hip Unilat  With Pelvis 2-3 Views Right  Result Date: 12/05/2016 CLINICAL DATA:  Acute onset of syncope and fall. Right hip pain. Initial encounter. EXAM: DG HIP (WITH OR WITHOUT PELVIS) 2-3V RIGHT COMPARISON:  None. FINDINGS: There is a mildly displaced subcapital fracture through the right femoral neck, with superior displacement of the distal femur. The right femoral head remains seated at the acetabulum. The left hip arthroplasty is grossly unremarkable. Mild degenerative change is noted at the lower lumbar spine. The sacroiliac joints are unremarkable in appearance. The visualized bowel gas pattern is grossly unremarkable in appearance. IMPRESSION: Mildly displaced subcapital fracture through the right femoral neck, with superior displacement of  the distal femur. Electronically Signed   By: Roanna Raider M.D.   On: 12/05/2016 17:58     Examination:  General appearance: alert, cooperative and no distress  Wound Exam: clean, dry, intact   Drainage:  Scant/small amount Serosanguinous exudate in hemovac  Motor Exam: EHL, FHL, Anterior Tibial and Posterior Tibial Intact  Sensory Exam: Superficial Peroneal, Deep Peroneal and Tibial normal  Vascular Exam: Normal  Assessment:    2 Days Post-Op  Procedure(s) (LRB): IRRIGATION AND DEBRIDEMENT HIP WITH ANTIBIOTIC BEAD APPLICATION (Right)  ADDITIONAL DIAGNOSIS:  Principal Problem:   Infection and inflammatory reaction due to internal right hip prosthesis, initial encounter (HCC) Active Problems:   Postoperative wound infection of right hip   S/P total hip arthroplasty   Prosthetic hip infection (HCC)   Staphylococcus aureus infection   Alzheimer's dementia with behavioral disturbance  Acute Blood Loss Anemia-asymptomatic   Plan: Physical Therapy as ordered Weight Bearing as Tolerated (WBAT)  DVT  Prophylaxis:  Aspirin, Foot Pumps and TED hose  DISCHARGE PLAN: Skilled Nursing Facility/Rehab  DISCHARGE NEEDS: HHPT, Walker and 3-in-1 comode seat-might already have at rehab facility   await culture report and final recommendation from ID re antibiotic coverage, then return to nursing facility, dressing removed and wound OK, hemovac removed, afebrile and VS stable, WBC decreased     Valeria Batman  12/24/2016 7:36 AM  Patient ID: Gabrielle [name], female   DOB: 1927-06-17, 83 y.o.   MRN: [id number]

## 2016-12-24 NOTE — Progress Notes (Signed)
[facility] and can return at DC- [facility]  CSW will continue to follow and assist with transfer once medically stable  Burna SisJenna H. Sanvika Cuttino, LCSW Clinical Social Worker [phone]

## 2016-12-25 DIAGNOSIS — F320 Major depressive disorder, single episode, mild: Secondary | ICD-10-CM | POA: Diagnosis not present

## 2016-12-25 DIAGNOSIS — F3289 Other specified depressive episodes: Secondary | ICD-10-CM | POA: Diagnosis not present

## 2016-12-25 DIAGNOSIS — E559 Vitamin D deficiency, unspecified: Secondary | ICD-10-CM | POA: Diagnosis not present

## 2016-12-25 DIAGNOSIS — K5900 Constipation, unspecified: Secondary | ICD-10-CM | POA: Diagnosis not present

## 2016-12-25 DIAGNOSIS — T8451XD Infection and inflammatory reaction due to internal right hip prosthesis, subsequent encounter: Secondary | ICD-10-CM | POA: Diagnosis not present

## 2016-12-25 DIAGNOSIS — B999 Unspecified infectious disease: Secondary | ICD-10-CM | POA: Diagnosis not present

## 2016-12-25 DIAGNOSIS — T8451XA Infection and inflammatory reaction due to internal right hip prosthesis, initial encounter: Secondary | ICD-10-CM | POA: Diagnosis not present

## 2016-12-25 DIAGNOSIS — M810 Age-related osteoporosis without current pathological fracture: Secondary | ICD-10-CM | POA: Diagnosis not present

## 2016-12-25 DIAGNOSIS — T814XXA Infection following a procedure, initial encounter: Secondary | ICD-10-CM | POA: Diagnosis not present

## 2016-12-25 DIAGNOSIS — F325 Major depressive disorder, single episode, in full remission: Secondary | ICD-10-CM | POA: Diagnosis not present

## 2016-12-25 DIAGNOSIS — S72001G Fracture of unspecified part of neck of right femur, subsequent encounter for closed fracture with delayed healing: Secondary | ICD-10-CM | POA: Diagnosis not present

## 2016-12-25 DIAGNOSIS — F329 Major depressive disorder, single episode, unspecified: Secondary | ICD-10-CM | POA: Diagnosis not present

## 2016-12-25 DIAGNOSIS — A4901 Methicillin susceptible Staphylococcus aureus infection, unspecified site: Secondary | ICD-10-CM | POA: Diagnosis not present

## 2016-12-25 DIAGNOSIS — Z0000 Encounter for general adult medical examination without abnormal findings: Secondary | ICD-10-CM | POA: Diagnosis not present

## 2016-12-25 DIAGNOSIS — F341 Dysthymic disorder: Secondary | ICD-10-CM | POA: Diagnosis not present

## 2016-12-25 DIAGNOSIS — B9561 Methicillin susceptible Staphylococcus aureus infection as the cause of diseases classified elsewhere: Secondary | ICD-10-CM | POA: Diagnosis not present

## 2016-12-25 DIAGNOSIS — D62 Acute posthemorrhagic anemia: Secondary | ICD-10-CM | POA: Diagnosis not present

## 2016-12-25 DIAGNOSIS — F0281 Dementia in other diseases classified elsewhere with behavioral disturbance: Secondary | ICD-10-CM | POA: Diagnosis not present

## 2016-12-25 DIAGNOSIS — R7301 Impaired fasting glucose: Secondary | ICD-10-CM | POA: Diagnosis not present

## 2016-12-25 DIAGNOSIS — R7989 Other specified abnormal findings of blood chemistry: Secondary | ICD-10-CM | POA: Diagnosis not present

## 2016-12-25 DIAGNOSIS — Z96649 Presence of unspecified artificial hip joint: Secondary | ICD-10-CM | POA: Diagnosis not present

## 2016-12-25 DIAGNOSIS — G301 Alzheimer's disease with late onset: Secondary | ICD-10-CM | POA: Diagnosis not present

## 2016-12-25 DIAGNOSIS — R2689 Other abnormalities of gait and mobility: Secondary | ICD-10-CM | POA: Diagnosis not present

## 2016-12-25 DIAGNOSIS — G309 Alzheimer's disease, unspecified: Secondary | ICD-10-CM | POA: Diagnosis not present

## 2016-12-25 DIAGNOSIS — Z87891 Personal history of nicotine dependence: Secondary | ICD-10-CM | POA: Diagnosis not present

## 2016-12-25 DIAGNOSIS — R52 Pain, unspecified: Secondary | ICD-10-CM | POA: Diagnosis not present

## 2016-12-25 DIAGNOSIS — R278 Other lack of coordination: Secondary | ICD-10-CM | POA: Diagnosis not present

## 2016-12-25 DIAGNOSIS — R062 Wheezing: Secondary | ICD-10-CM | POA: Diagnosis not present

## 2016-12-25 DIAGNOSIS — S92153A Displaced avulsion fracture (chip fracture) of unspecified talus, initial encounter for closed fracture: Secondary | ICD-10-CM | POA: Diagnosis not present

## 2016-12-25 DIAGNOSIS — F0150 Vascular dementia without behavioral disturbance: Secondary | ICD-10-CM | POA: Diagnosis not present

## 2016-12-25 DIAGNOSIS — E7800 Pure hypercholesterolemia, unspecified: Secondary | ICD-10-CM | POA: Diagnosis not present

## 2016-12-25 DIAGNOSIS — Z9889 Other specified postprocedural states: Secondary | ICD-10-CM | POA: Diagnosis not present

## 2016-12-25 DIAGNOSIS — M6281 Muscle weakness (generalized): Secondary | ICD-10-CM | POA: Diagnosis not present

## 2016-12-25 DIAGNOSIS — Z8709 Personal history of other diseases of the respiratory system: Secondary | ICD-10-CM | POA: Diagnosis not present

## 2016-12-25 DIAGNOSIS — H409 Unspecified glaucoma: Secondary | ICD-10-CM | POA: Diagnosis not present

## 2016-12-25 DIAGNOSIS — E569 Vitamin deficiency, unspecified: Secondary | ICD-10-CM | POA: Diagnosis not present

## 2016-12-25 DIAGNOSIS — E782 Mixed hyperlipidemia: Secondary | ICD-10-CM | POA: Diagnosis not present

## 2016-12-25 DIAGNOSIS — Z9181 History of falling: Secondary | ICD-10-CM | POA: Diagnosis not present

## 2016-12-25 DIAGNOSIS — Z471 Aftercare following joint replacement surgery: Secondary | ICD-10-CM | POA: Diagnosis not present

## 2016-12-25 DIAGNOSIS — R41841 Cognitive communication deficit: Secondary | ICD-10-CM | POA: Diagnosis not present

## 2016-12-25 DIAGNOSIS — T8450XD Infection and inflammatory reaction due to unspecified internal joint prosthesis, subsequent encounter: Secondary | ICD-10-CM | POA: Diagnosis not present

## 2016-12-25 DIAGNOSIS — R262 Difficulty in walking, not elsewhere classified: Secondary | ICD-10-CM | POA: Diagnosis not present

## 2016-12-25 DIAGNOSIS — R05 Cough: Secondary | ICD-10-CM | POA: Diagnosis not present

## 2016-12-25 DIAGNOSIS — F0151 Vascular dementia with behavioral disturbance: Secondary | ICD-10-CM | POA: Diagnosis not present

## 2016-12-25 DIAGNOSIS — D649 Anemia, unspecified: Secondary | ICD-10-CM | POA: Diagnosis not present

## 2016-12-25 DIAGNOSIS — T8459XD Infection and inflammatory reaction due to other internal joint prosthesis, subsequent encounter: Secondary | ICD-10-CM | POA: Diagnosis not present

## 2016-12-25 DIAGNOSIS — E785 Hyperlipidemia, unspecified: Secondary | ICD-10-CM | POA: Diagnosis not present

## 2016-12-25 DIAGNOSIS — Z96641 Presence of right artificial hip joint: Secondary | ICD-10-CM | POA: Diagnosis not present

## 2016-12-25 LAB — BASIC METABOLIC PANEL
ANION GAP: 6 (ref 5–15)
BUN: 13 mg/dL (ref 6–20)
CHLORIDE: 100 mmol/L — AB (ref 101–111)
CO2: 31 mmol/L (ref 22–32)
CREATININE: 0.56 mg/dL (ref 0.44–1.00)
Calcium: 9 mg/dL (ref 8.9–10.3)
GFR calc non Af Amer: >60 mL/min (ref >60)
Glucose, Bld: 81 mg/dL (ref 65–99)
POTASSIUM: 3.9 mmol/L (ref 3.5–5.1)
SODIUM: 137 mmol/L (ref 135–145)

## 2016-12-25 LAB — CBC
HCT: 31.1 % — ABNORMAL LOW (ref 36.0–46.0)
HEMOGLOBIN: 9.5 g/dL — AB (ref 12.0–15.0)
MCH: 28.4 pg (ref 26.0–34.0)
MCHC: 30.5 g/dL (ref 30.0–36.0)
MCV: 93.1 fL (ref 78.0–100.0)
Platelets: 319 10*3/uL (ref 150–400)
RBC: 3.34 MIL/uL — AB (ref 3.87–5.11)
RDW: 15.2 % (ref 11.5–15.5)
WBC: 8.9 10*3/uL (ref 4.0–10.5)

## 2016-12-25 LAB — WOUND CULTURE
GRAM STAIN: NONE SEEN
GRAM STAIN: NONE SEEN

## 2016-12-25 LAB — ACID FAST SMEAR (AFB)

## 2016-12-25 LAB — AEROBIC CULTURE W GRAM STAIN (SUPERFICIAL SPECIMEN)

## 2016-12-25 LAB — ACID FAST SMEAR (AFB, MYCOBACTERIA)
Acid Fast Smear: NEGATIVE
Acid Fast Smear: NEGATIVE

## 2016-12-25 LAB — AEROBIC CULTURE  (SUPERFICIAL SPECIMEN)

## 2016-12-25 MED ORDER — CEFAZOLIN SODIUM-DEXTROSE 2-4 GM/100ML-% IV SOLN: 2.0000 g | Freq: Three times a day (TID) | INTRAVENOUS | 9 refills | Status: AC

## 2016-12-25 MED ORDER — TIMOLOL MALEATE 0.25 % OP SOLN: 1.0000 [drp] | Freq: Two times a day (BID) | OPHTHALMIC | 12 refills | Status: AC

## 2016-12-25 MED ORDER — MUPIROCIN 2 % EX OINT: 1.0000 "application " | TOPICAL_OINTMENT | Freq: Two times a day (BID) | CUTANEOUS | 0 refills | Status: AC

## 2016-12-25 MED ORDER — ASPIRIN 325 MG PO TBEC: 325.0000 mg | DELAYED_RELEASE_TABLET | Freq: Every day | ORAL | 0 refills | Status: AC

## 2016-12-25 MED ORDER — SODIUM CHLORIDE 0.9% FLUSH: 10.0000 mL | INTRAVENOUS | Status: DC | PRN

## 2016-12-25 MED ORDER — PREDNISONE 20 MG PO TABS: 20.0000 mg | ORAL_TABLET | Freq: Every day | ORAL | 0 refills | Status: AC

## 2016-12-25 MED ORDER — RIFAMPIN 300 MG PO CAPS: 300.0000 mg | ORAL_CAPSULE | Freq: Two times a day (BID) | ORAL | 1 refills | Status: AC

## 2016-12-25 NOTE — Progress Notes (Signed)
Attempted to call report to [facility] x 3 with no answer. Called admissions coordinator Tresa EndoKelly and left callback number to be reached at.

## 2016-12-25 NOTE — Clinical Social Work Note (Signed)
CSW facilitated patient discharge including contacting patient family and facility to confirm patient discharge plans. Clinical information faxed to facility and family agreeable with plan. CSW arranged ambulance transport via [facility] at 1:00. RN to call report prior to discharge [phone]).  CSW will sign off for now as social work intervention is no longer needed. Please consult us again if new needs arise.  Charlynn CourtSarah Rebbie Lauricella, CSW [phone]

## 2016-12-25 NOTE — NC FL2 (Signed)
[place] MEDICAID FL2 LEVEL OF CARE SCREENING TOOL     IDENTIFICATION  Patient Name: Gabrielle [name] Birthdate: 1927/05/17 Sex: female Admission Date (Current Location): 12/22/2016  [facility] and [place] Number:  Producer, television/film/video and Address:  [facility] [place], [address] [place], [place] [address]      Provider Number: [id number]  Attending Physician Name and Address:  Valeria Batman, MD  Relative Name and Phone Number:       Current Level of Care: Hospital Recommended Level of Care: Skilled Nursing Facility Prior Approval Number:    Date Approved/Denied:   PASRR Number:    Discharge Plan: SNF    Current Diagnoses: Patient Active Problem List   Diagnosis Date Noted  . Prosthetic hip infection (HCC)   . Staphylococcus aureus infection   . Alzheimer's dementia with behavioral disturbance   . Postoperative wound infection of right hip 12/22/2016  . Infection and inflammatory reaction due to internal right hip prosthesis, initial encounter (HCC) 12/22/2016  . S/P total hip arthroplasty 12/22/2016  . SOB (shortness of breath)   . Influenza A 12/06/2016  . Closed fracture of right hip (HCC) 12/05/2016  . Dementia 12/05/2016  . Fall 12/05/2016  . Cough 12/05/2016  . S/P hip hemiarthroplasty 09/16/2016  . Mallet finger, left small finger 09/16/2016  . Hip fracture (HCC) 08/30/2016  . Vascular dementia 08/30/2016  . Osteoporosis 08/30/2016  . Vaginal candida 08/30/2016  . UTI (urinary tract infection) 08/30/2016  . Mild aortic insufficiency: Per 2 d echo 08/04/2015 08/04/2015  . Moderate mitral regurgitation: Per 2 d echo 08/04/2015 08/04/2015  . Glaucoma 08/04/2015  . Dizziness 08/04/2015  . Syncope 08/03/2015  . Depression 12/09/2013  . Constipation 11/30/2013  . Pure hypercholesterolemia 11/30/2013  . Acute posthemorrhagic anemia 11/30/2013  . OA (osteoarthritis) of knee 11/21/2013    Orientation RESPIRATION BLADDER Height &  Weight     Self, Situation, Place  O2 (2L [place]) Incontinent Weight:   Height:     BEHAVIORAL SYMPTOMS/MOOD NEUROLOGICAL BOWEL NUTRITION STATUS      Continent    AMBULATORY STATUS COMMUNICATION OF NEEDS Skin   Extensive Assist Verbally Surgical wounds                       Personal Care Assistance Level of Assistance  Bathing, Dressing Bathing Assistance: Limited assistance   Dressing Assistance: Limited assistance     Functional Limitations Info             SPECIAL CARE FACTORS FREQUENCY  PT (By licensed PT), OT (By licensed OT)     PT Frequency: 5/wk OT Frequency: 5/wk            Contractures      Additional Factors Info  Code Status, Allergies, Psychotropic Code Status Info: DNR Allergies Info: NKA Psychotropic Info: celexa         Current Medications (12/25/2016):  This is the current hospital active medication list Current Facility-Administered Medications  Medication Dose Route Frequency Provider Last Rate Last Dose  . albuterol (PROVENTIL) (2.5 MG/3ML) 0.083% nebulizer solution 2.5 mg  2.5 mg Nebulization Q6H PRN Jetty Peeks, PA-C      . alum & mag hydroxide-simeth (MAALOX/MYLANTA) 200-200-20 MG/5ML suspension 30 mL  30 mL Oral Q4H PRN Jetty Peeks, PA-C      . aspirin EC tablet 325 mg  325 mg Oral Q breakfast Jetty Peeks, PA-C   325 mg at 12/25/16  0950  . atorvastatin (LIPITOR) tablet 20 mg  20 mg Oral q1800 Oris DroneBrian D Petrarca, PA-C   20 mg at 12/24/16 1759  . bisacodyl (DULCOLAX) suppository 10 mg  10 mg Rectal Daily PRN Jetty PeeksBrian D Petrarca, PA-C   10 mg at 12/25/16 1123  . calcium-vitamin D (OSCAL WITH D) 500-200 MG-UNIT per tablet 1 tablet  1 tablet Oral BID Valeria BatmanPeter W [facility], MD   1 tablet at 12/25/16 [id number]  . ceFAZolin (ANCEF) IVPB 2g/100 mL premix  2 g Intravenous Q8H Darl Householderlison M Masters, RPH   2 g at 12/25/16 [id number]  . Chlorhexidine Gluconate Cloth 2 % PADS 6 each  6 each Topical Daily Valeria BatmanPeter W [facility], MD   6 each at 12/24/16 1254  .  cholecalciferol (VITAMIN D) tablet 4,000 Units  4,000 Units Oral QHS Jetty PeeksBrian D Petrarca, PA-C   4,000 Units at 12/24/16 2145  . citalopram (CELEXA) tablet 20 mg  20 mg Oral QHS Oris DroneBrian D Petrarca, PA-C   20 mg at 12/24/16 2144  . dextromethorphan-guaiFENesin (MUCINEX DM) 30-600 MG per 12 hr tablet 1 tablet  1 tablet Oral BID Jetty PeeksBrian D Petrarca, PA-C   1 tablet at 12/25/16 0950  . diphenhydrAMINE (BENADRYL) 12.5 MG/5ML elixir 12.5-25 mg  12.5-25 mg Oral Q4H PRN Oris DroneBrian D Petrarca, PA-C      . docusate sodium (COLACE) capsule 100 mg  100 mg Oral BID Oris DroneBrian D Petrarca, PA-C   100 mg at 12/25/16 0950  . donepezil (ARICEPT) tablet 10 mg  10 mg Oral QHS Oris DroneBrian D Petrarca, PA-C   10 mg at 12/24/16 2144  . feeding supplement (ENSURE ENLIVE) (ENSURE ENLIVE) liquid 237 mL  237 mL Oral BID BM Oris DroneBrian D Petrarca, PA-C   237 mL at 12/25/16 0950  . HYDROcodone-acetaminophen (NORCO/VICODIN) 5-325 MG per tablet 1 tablet  1 tablet Oral Q6H PRN Jetty PeeksBrian D Petrarca, PA-C   1 tablet at 12/25/16 1145  . memantine (NAMENDA) tablet 5 mg  5 mg Oral QHS Oris DroneBrian D Petrarca, PA-C   5 mg at 12/24/16 2145  . menthol-cetylpyridinium (CEPACOL) lozenge 3 mg  1 lozenge Oral PRN Oris DroneBrian D Petrarca, PA-C       Or  . phenol (CHLORASEPTIC) mouth spray 1 spray  1 spray Mouth/Throat PRN Jetty PeeksBrian D Petrarca, PA-C      . metoCLOPramide (REGLAN) tablet 5-10 mg  5-10 mg Oral Q8H PRN Jetty PeeksBrian D Petrarca, PA-C       Or  . metoCLOPramide (REGLAN) injection 5-10 mg  5-10 mg Intravenous Q8H PRN Jetty PeeksBrian D Petrarca, PA-C      . multivitamin (PROSIGHT) tablet 1 tablet  1 tablet Oral QHS Valeria BatmanPeter W [facility], MD   1 tablet at 12/24/16 2144  . mupirocin ointment (BACTROBAN) 2 % 1 application  1 application Nasal BID Valeria BatmanPeter W [facility], MD   1 application at 12/25/16 [phone]  . omega-3 acid ethyl esters (LOVAZA) capsule 1 g  1 g Oral Daily Jetty PeeksBrian D Petrarca, PA-C   1 g at 12/25/16 [id number]  . ondansetron (ZOFRAN) tablet 4 mg  4 mg Oral Q6H PRN Jetty PeeksBrian D Petrarca, PA-C       Or  .  ondansetron (ZOFRAN) injection 4 mg  4 mg Intravenous Q6H PRN Oris DroneBrian D Petrarca, PA-C      . polyethylene glycol (MIRALAX / GLYCOLAX) packet 17 g  17 g Oral Daily PRN Jetty PeeksBrian D Petrarca, PA-C   17 g at 12/24/16 0939  . predniSONE (DELTASONE) tablet 20 mg  20 mg Oral Q  breakfast Jetty Peeks, PA-C   20 mg at 12/25/16 0950  . rifampin (RIFADIN) capsule 300 mg  300 mg Oral Q12H Randall Hiss, MD   300 mg at 12/25/16 0951  . sodium chloride flush (NS) 0.9 % injection 10-40 mL  10-40 mL Intracatheter PRN Valeria Batman, MD      . timolol (TIMOPTIC) 0.25 % ophthalmic solution 1 drop  1 drop Both Eyes BID Valeria Batman, MD   1 drop at 12/25/16 1610     Discharge Medications: Please see discharge summary for a list of discharge medications.  Relevant Imaging Results:  Relevant Lab Results:   Additional Information SSN:  [id number]  Margarito Liner, LCSW

## 2016-12-25 NOTE — Progress Notes (Signed)
Peripherally Inserted Central Catheter/Midline Placement  The IV Nurse has discussed with the patient and/or persons authorized to consent for the patient, the purpose of this procedure and the potential benefits and risks involved with this procedure.  The benefits include less needle sticks, lab draws from the catheter, and the patient may be discharged home with the catheter. Risks include, but not limited to, infection, bleeding, blood clot (thrombus formation), and puncture of an artery; nerve damage and irregular heartbeat and possibility to perform a PICC exchange if needed/ordered by physician.  Alternatives to this procedure were also discussed.  Bard Power PICC patient education guide, fact sheet on infection prevention and patient information card has been provided to patient /or left at bedside.    PICC/Midline Placement Documentation        Gabrielle [name], Gabrielle [name] 12/25/2016, 11:00 AM Phone consent obtained from son Mickle MalloryGerald Sass

## 2016-12-25 NOTE — Clinical Social Work Note (Signed)
Clinical Social Work Assessment  Patient Details  Name: Gabrielle [name] MRN: [id number] Date of Birth: [birth year]  Date of referral:  12/25/16               Reason for consult:  Discharge Planning                Permission sought to share information with:  Facility Sport and exercise psychologist, Family Supports Permission granted to share information::  Yes, Verbal Permission Granted  Name::     Gabrielle [name]  Agency::  [facility]  Relationship::  Son  Sport and exercise psychologist Information:  [phone]  Housing/Transportation Living arrangements for the past 2 months:  [place] of Information:  Patient, Medical Team, Other (Comment Required) (Daughter-in-law) Patient Interpreter Needed:  None Criminal Activity/Legal Involvement Pertinent to Current Situation/Hospitalization:  No - Comment as needed Significant Relationships:  Adult Children, Other Family Members Lives with:  Facility Resident Do you feel safe going back to the place where you live?  Yes Need for family participation in patient care:  Yes (Comment)  Care giving concerns:  Patient was admitted from [facility].   Social Worker assessment / plan:  CSW met with patient. Daughter-in-law at bedside. CSW introduced role and explained that discharge planning would be discussed. Patient oriented to self only. Patient's daughter-in-law confirmed plan for return to [facility] today. CSW called and notified patient's son as well. [facility] admissions coordinator requested [facility] for after lunch. Will set up for 1:00. RN aware. No further concerns. CSW encouraged patient's son and daughter-in-law to contact CSW as needed. CSW will continue to follow patient and her family for support and facilitate discharge to SNF today.  Employment status:  Retired Forensic scientist:  Medicare PT Recommendations:  [place] / Referral to community resources:  [place]  Patient/Family's Response to care:  Patient not fully oriented. Patient's family agreeable to return to [facility]. Patient's family supportive and involved in patient's care. Patient's family appreciated social work intervention.  Patient/Family's Understanding of and Emotional Response to Diagnosis, Current Treatment, and Prognosis:  Patient not fully oriented. Patient's family appear to have a good understanding of the reason for admission. Patient's family appear happy with hospital care.  Emotional Assessment Appearance:  Appears stated age Attitude/Demeanor/Rapport:  Other (Appears to be in pain) Affect (typically observed):  Accepting Orientation:  Oriented to Self, Oriented to Place, Oriented to  Time, Oriented to Situation Alcohol / Substance use:  Never Used Psych involvement (Current and /or in the community):  No (Comment)  Discharge Needs  Concerns to be addressed:  Care Coordination Readmission within the last 30 days:  Yes Current discharge risk:  Cognitively Impaired, Dependent with Mobility Barriers to Discharge:  No Barriers Identified   Candie Chroman, LCSW 12/25/2016, 12:10 PM

## 2016-12-25 NOTE — Discharge Summary (Signed)
[name], MD   [name], PA-C [address], [place], [place]  [address]                             [phone]  PATIENT ID: Gabrielle [name]        MRN:  [id number]          DOB/AGE: [birth year] / 83 y.o.    DISCHARGE SUMMARY  ADMISSION DATE:    12/22/2016 DISCHARGE DATE:   12/25/2016   ADMISSION DIAGNOSIS: rt hip post op wound infection  Infected Right Hip    DISCHARGE DIAGNOSIS:  Infected Right Hip    ADDITIONAL DIAGNOSIS: Principal Problem:   Infection and inflammatory reaction due to internal right hip prosthesis, initial encounter (HCC) Active Problems:   Postoperative wound infection of right hip   S/P total hip arthroplasty   Prosthetic hip infection (HCC)   Staphylococcus aureus infection   Alzheimer's dementia with behavioral disturbance  Past Medical History:  Diagnosis Date  . Arthritis    oa  . Glaucoma 08/04/2015  . Hyperlipidemia   . Mild aortic insufficiency: Per 2 d echo 08/04/2015 08/04/2015  . Moderate mitral regurgitation: Per 2 d echo 08/04/2015 08/04/2015    PROCEDURE: Procedure(s): IRRIGATION AND DEBRIDEMENT HIP WITH ANTIBIOTIC BEAD APPLICATION Right on 12/22/2016  CONSULTS:  Treatment Team:  Gardiner Barefoot, MD   HISTORY: Patient is status post right hip hemiarthroplasty performed by Dr. Annell Greening 12/06/2016. After admission was transferred to a skilled facility for rehabilitation. Presented to our office today for routine postop appointment. He had been advised by the facility the patient has been having increased drainage from her right hip.  HOSPITAL COURSE:  ANGELIC SCHNELLE is a 83 y.o. admitted on 12/22/2016 and found to have a diagnosis of Infected Right Hip.  After appropriate laboratory studies were obtained  they were taken to the operating room on 12/22/2016 and underwent  Procedure(s): IRRIGATION AND DEBRIDEMENT HIP WITH ANTIBIOTIC BEAD APPLICATION  Right.   They were given perioperative antibiotics:    Anti-infectives    Start     Dose/Rate Route Frequency Ordered Stop   12/25/16 0000  ceFAZolin (ANCEF) 2-4 GM/100ML-% IVPB     2 g 200 mL/hr over 30 Minutes Intravenous Every 8 hours 12/25/16 1044     12/25/16 0000  rifampin (RIFADIN) 300 MG capsule     300 mg Oral Every 12 hours 12/25/16 1044     12/24/16 2200  rifampin (RIFADIN) capsule 300 mg     300 mg Oral Every 12 hours 12/24/16 1953     12/24/16 1730  ceFAZolin (ANCEF) IVPB 2g/100 mL premix     2 g 200 mL/hr over 30 Minutes Intravenous Every 8 hours 12/24/16 1229     12/23/16 1100  vancomycin (VANCOCIN) 500 mg in sodium chloride 0.9 % 100 mL IVPB  Status:  Discontinued     500 mg 100 mL/hr over 60 Minutes Intravenous Every 12 hours 12/22/16 2142 12/25/16 0943   12/22/16 2200  ceFAZolin (ANCEF) IVPB 2g/100 mL premix  Status:  Discontinued     2 g 200 mL/hr over 30 Minutes Intravenous Every 6 hours 12/22/16 2106 12/24/16 1229   12/22/16 2200  vancomycin (VANCOCIN) IVPB 1000 mg/200 mL premix     1,000 mg 200 mL/hr over 60 Minutes Intravenous  Once 12/22/16 2142 12/22/16 2347   12/22/16 1744  ceFAZolin (ANCEF) 2-4 GM/100ML-% IVPB    Comments:  Katrinka Blazing,  Alyssia   : cabinet override      12/22/16 1744 12/23/16 0559    .  Tolerated the procedure well.  Placed with a foley intraoperatively.     POD #1, allowed out of bed to a chair.  PT for ambulation and exercise program.   IV saline locked.  O2 discontionued.  Hemovac pulled. ID consult obtained.  Antibiotics given per ID  POD #2, continued PT and IV antibiotics  POD 33, continued Antibiotics, PICC line placed and plans were solidified for return to SNF The remainder of the hospital course was dedicated to ambulation and strengthening.   The patient was discharged on 3 Days Post-Op in  Stable condition.  Blood products given:none  DIAGNOSTIC STUDIES: Recent vital signs: Patient Vitals for the past 24 hrs:  BP Temp Temp src Pulse Resp SpO2  12/25/16 0427 (!) 139/48 98.6 F  (37 C) Oral 65 16 95 %  12/24/16 2123 110/62 98.5 F (36.9 C) Oral 70 16 96 %  12/24/16 1501 (!) 109/40 98.8 F (37.1 C) Oral 64 16 96 %       Recent laboratory studies:  Recent Labs  12/22/16 1432 12/23/16 0316 12/24/16 0356 12/25/16 0342  WBC 14.5* 14.2* 10.3 8.9  HGB 11.4* 9.1* 9.0* 9.5*  HCT 36.2 29.5* 28.5* 31.1*  PLT 394 316 290 319    Recent Labs  12/22/16 1432 12/23/16 0316 12/24/16 0356 12/25/16 0342  NA 136 134* 136 137  K 3.9 3.8 4.8 3.9  CL 97* 98* 98* 100*  CO2 29 28 31 31   BUN 11 11 12 13   CREATININE 0.77 0.67 0.72 0.56  GLUCOSE 133* 97 100* 81  CALCIUM 9.8 8.8* 8.9 9.0   Lab Results  Component Value Date   INR 1.30 12/22/2016   INR 1.10 12/05/2016   INR 1.08 08/30/2016     Recent Radiographic Studies :  Dg Chest 1 View  Result Date: 12/05/2016 CLINICAL DATA:  Preoperative chest radiograph, for hip fracture. Cough. Initial encounter. EXAM: CHEST 1 VIEW COMPARISON:  Chest radiograph performed 08/30/2016 FINDINGS: The lungs are well-aerated and clear. There is no evidence of focal opacification, pleural effusion or pneumothorax. The cardiomediastinal silhouette is borderline normal in size. No acute osseous abnormalities are seen. Right convex thoracic scoliosis is noted. IMPRESSION: No acute cardiopulmonary process seen. Right convex thoracic scoliosis noted. No displaced rib fractures identified. Electronically Signed   By: Roanna Raider M.D.   On: 12/05/2016 19:13   Ct Head Wo Contrast  Result Date: 12/05/2016 CLINICAL DATA:  Dizziness with subsequent fall. EXAM: CT HEAD WITHOUT CONTRAST CT CERVICAL SPINE WITHOUT CONTRAST TECHNIQUE: Multidetector CT imaging of the head and cervical spine was performed following the standard protocol without intravenous contrast. Multiplanar CT image reconstructions of the cervical spine were also generated. COMPARISON:  08/03/2015 FINDINGS: CT HEAD FINDINGS Brain: Ventricles and cisterns are within normal. There is  mild age related atrophic change. There is no mass, mass effect, shift of midline structures or acute hemorrhage. No evidence of acute infarction. Minimal chronic ischemic microvascular disease. Vascular: Mild plaque over the cavernous segment of the internal carotid arteries. Skull: Within normal. Sinuses/Orbits: Orbits are within normal. Paranasal sinuses are well developed and well aerated without air-fluid levels or significant opacification. Other: None. CT CERVICAL SPINE FINDINGS Alignment:  Within normal. Skull base and vertebrae: Vertebral body alignment and heights are within normal. There is mild spondylosis throughout the cervical spine. There is moderate uncovertebral joint spurring and mild facet arthropathy. No  acute fracture. Soft tissues and spinal canal: Prevertebral soft tissues are within normal. Spinal canal is within normal. Disc levels: Mild disc space narrowing at the C3-4, C4-5, C5-6 and C6-7 levels. Upper chest: Within normal. Other: None. IMPRESSION: No acute intracranial findings. Minimal chronic ischemic microvascular disease. No acute cervical spine injury. Mild spondylosis throughout the cervical spine with mild multilevel disc disease. Electronically Signed   By: Elberta Fortis M.D.   On: 12/05/2016 19:02   Ct Cervical Spine Wo Contrast  Result Date: 12/05/2016 CLINICAL DATA:  Dizziness with subsequent fall. EXAM: CT HEAD WITHOUT CONTRAST CT CERVICAL SPINE WITHOUT CONTRAST TECHNIQUE: Multidetector CT imaging of the head and cervical spine was performed following the standard protocol without intravenous contrast. Multiplanar CT image reconstructions of the cervical spine were also generated. COMPARISON:  08/03/2015 FINDINGS: CT HEAD FINDINGS Brain: Ventricles and cisterns are within normal. There is mild age related atrophic change. There is no mass, mass effect, shift of midline structures or acute hemorrhage. No evidence of acute infarction. Minimal chronic ischemic microvascular  disease. Vascular: Mild plaque over the cavernous segment of the internal carotid arteries. Skull: Within normal. Sinuses/Orbits: Orbits are within normal. Paranasal sinuses are well developed and well aerated without air-fluid levels or significant opacification. Other: None. CT CERVICAL SPINE FINDINGS Alignment:  Within normal. Skull base and vertebrae: Vertebral body alignment and heights are within normal. There is mild spondylosis throughout the cervical spine. There is moderate uncovertebral joint spurring and mild facet arthropathy. No acute fracture. Soft tissues and spinal canal: Prevertebral soft tissues are within normal. Spinal canal is within normal. Disc levels: Mild disc space narrowing at the C3-4, C4-5, C5-6 and C6-7 levels. Upper chest: Within normal. Other: None. IMPRESSION: No acute intracranial findings. Minimal chronic ischemic microvascular disease. No acute cervical spine injury. Mild spondylosis throughout the cervical spine with mild multilevel disc disease. Electronically Signed   By: Elberta Fortis M.D.   On: 12/05/2016 19:02   Dg Pelvis Portable  Result Date: 12/22/2016 CLINICAL DATA:  Status post debridement of right hip. Assess for dislocation. Initial encounter. EXAM: PORTABLE PELVIS 1-2 VIEWS COMPARISON:  Right hip radiographs performed earlier today at 11:21 a.m. FINDINGS: There is no evidence of dislocation at this time. Both hip arthroplasties are grossly unremarkable in appearance, without evidence of loosening. There is no evidence of fracture. Postoperative air is seen about the right hip, with associated drainage catheter. IMPRESSION: No evidence of dislocation at this time. Bilateral hip arthroplasties are unremarkable in appearance. Electronically Signed   By: Roanna Raider M.D.   On: 12/22/2016 20:44   Pelvis Portable  Result Date: 12/06/2016 CLINICAL DATA:  Status post right hip arthroplasty. EXAM: PORTABLE PELVIS 1-2 VIEWS COMPARISON:  08/31/2016 FINDINGS: New  right hip hemiarthroplasty appears well-seated and aligned. Left hemiarthroplasty is stable, also well-seated and aligned. No acute fracture or evidence of an operative complication. IMPRESSION: Well-positioned right hip hemiarthroplasty. Electronically Signed   By: Amie Portland M.D.   On: 12/06/2016 09:50   Dg Chest Port 1 View  Result Date: 12/22/2016 CLINICAL DATA:  Preop testing right hip surgery EXAM: PORTABLE CHEST 1 VIEW COMPARISON:  12/09/2016 FINDINGS: Lungs remain clear without infiltrate or effusion. Negative for heart failure. Atherosclerotic calcification aortic arch. Marked dextroscoliosis thoracic spine unchanged. IMPRESSION: No active disease. Electronically Signed   By: Marlan Palau M.D.   On: 12/22/2016 14:16   Dg Chest Port 1 View  Result Date: 12/09/2016 CLINICAL DATA:  Shortness of breath, cough EXAM: PORTABLE CHEST 1  VIEW COMPARISON:  12/05/2016 FINDINGS: There is hyperinflation of the lungs compatible with COPD. Left lower lobe atelectasis. Right lung is clear. No effusions. Heart is mildly enlarged. Tortuosity of the thoracic aorta with calcifications. Severe thoracolumbar scoliosis. IMPRESSION: COPD.  Left base atelectasis. Electronically Signed   By: Charlett Nose M.D.   On: 12/09/2016 12:41   Dg Hip Port Unilat With Pelvis 1v Right  Result Date: 12/22/2016 CLINICAL DATA:  Status post debridement of right hip. Assess for dislocation. Initial encounter. EXAM: DG HIP (WITH OR WITHOUT PELVIS) 1V PORT RIGHT COMPARISON:  Right hip radiographs performed earlier today at 11:21 a.m. FINDINGS: There is no evidence of dislocation on the provided cross-table lateral view. There is no evidence of loosening. A drainage catheter is noted overlying the right hip. The right hip arthroplasty is grossly unremarkable in appearance. IMPRESSION: No evidence of dislocation. Right hip arthroplasty is grossly unremarkable in appearance. Electronically Signed   By: Roanna Raider M.D.   On: 12/22/2016  20:45   Dg Hip Unilat  With Pelvis 2-3 Views Right  Result Date: 12/05/2016 CLINICAL DATA:  Acute onset of syncope and fall. Right hip pain. Initial encounter. EXAM: DG HIP (WITH OR WITHOUT PELVIS) 2-3V RIGHT COMPARISON:  None. FINDINGS: There is a mildly displaced subcapital fracture through the right femoral neck, with superior displacement of the distal femur. The right femoral head remains seated at the acetabulum. The left hip arthroplasty is grossly unremarkable. Mild degenerative change is noted at the lower lumbar spine. The sacroiliac joints are unremarkable in appearance. The visualized bowel gas pattern is grossly unremarkable in appearance. IMPRESSION: Mildly displaced subcapital fracture through the right femoral neck, with superior displacement of the distal femur. Electronically Signed   By: Roanna Raider M.D.   On: 12/05/2016 17:58    DISCHARGE INSTRUCTIONS: Discharge Instructions    Call MD / Call 911    Complete by:  As directed    If you experience chest pain or shortness of breath, CALL 911 and be transported to the hospital emergency room.  If you develope a fever above 101 F, pus (white drainage) or increased drainage or redness at the wound, or calf pain, call your surgeon's office.   Change dressing    Complete by:  As directed    DO NOT CHANGE DRESSING. KEEP ON TILL SEEN IN THE OFFICE   Diet general    Complete by:  As directed    Discharge instructions    Complete by:  As directed    INSTRUCTIONS AFTER JOINT REPLACEMENT   Remove items at home which could result in a fall. This includes throw rugs or furniture in walking pathways ICE to the affected joint every three hours while awake for 30 minutes at a time, for at least the first 3-5 days, and then as needed for pain and swelling.  Continue to use ice for pain and swelling. You may notice swelling that will progress down to the foot and ankle.  This is normal after surgery.  Elevate your leg when you are not up  walking on it.   Continue to use the breathing machine you got in the hospital (incentive spirometer) which will help keep your temperature down.  It is common for your temperature to cycle up and down following surgery, especially at night when you are not up moving around and exerting yourself.  The breathing machine keeps your lungs expanded and your temperature down.   DIET:  As you were doing prior  to hospitalization, we recommend a well-balanced diet.  DRESSING / WOUND CARE / SHOWERING  Keep the surgical dressing until follow up.  The dressing is water proof, so you can shower without any extra covering.  IF THE DRESSING FALLS OFF or the wound gets wet inside, change the dressing with sterile gauze.  Please use good hand washing techniques before changing the dressing.  Do not use any lotions or creams on the incision until instructed by your surgeon.    ACTIVITY  Increase activity slowly as tolerated, but follow the weight bearing instructions below.   No driving for 6 weeks or until further direction given by your physician.  You cannot drive while taking narcotics.  No lifting or carrying greater than 10 lbs. until further directed by your surgeon. Avoid periods of inactivity such as sitting longer than an hour when not asleep. This helps prevent blood clots.  You may return to work once you are authorized by your doctor.     WEIGHT BEARING   Weight bearing as tolerated with assist device (walker, cane, etc) as directed, use it as long as suggested by your surgeon or therapist, typically at least 4-6 weeks.   EXERCISES  Results after joint replacement surgery are often greatly improved when you follow the exercise, range of motion and muscle strengthening exercises prescribed by your doctor. Safety measures are also important to protect the joint from further injury. Any time any of these exercises cause you to have increased pain or swelling, decrease what you are doing until you  are comfortable again and then slowly increase them. If you have problems or questions, call your caregiver or physical therapist for advice.   Rehabilitation is important following a joint replacement. After just a few days of immobilization, the muscles of the leg can become weakened and shrink (atrophy).  These exercises are designed to build up the tone and strength of the thigh and leg muscles and to improve motion. Often times heat used for twenty to thirty minutes before working out will loosen up your tissues and help with improving the range of motion but do not use heat for the first two weeks following surgery (sometimes heat can increase post-operative swelling).   These exercises can be done on a training (exercise) mat, on a table or on a bed. Use whatever works the best and is most comfortable for you.    Use music or television while you are exercising so that the exercises are a pleasant break in your day. This will make your life better with the exercises acting as a break in your routine that you can look forward to.   Perform all exercises about fifteen times, three times per day or as directed.  You should exercise both the operative leg and the other leg as well.   Exercises include:  Quad Sets - Tighten up the muscle on the front of the thigh (Quad) and hold for 5-10 seconds.   Straight Leg Raises - With your knee straight (if you were given a brace, keep it on), lift the leg to 60 degrees, hold for 3 seconds, and slowly lower the leg.  Perform this exercise against resistance later as your leg gets stronger.  Leg Slides: Lying on your back, slowly slide your foot toward your buttocks, bending your knee up off the floor (only go as far as is comfortable). Then slowly slide your foot back down until your leg is flat on the floor again.  Angel Wings:  Lying on your back spread your legs to the side as far apart as you can without causing discomfort.  Hamstring Strength:  Lying on your  back, push your heel against the floor with your leg straight by tightening up the muscles of your buttocks.  Repeat, but this time bend your knee to a comfortable angle, and push your heel against the floor.  You may put a pillow under the heel to make it more comfortable if necessary.   A rehabilitation program following joint replacement surgery can speed recovery and prevent re-injury in the future due to weakened muscles. Contact your doctor or a physical therapist for more information on knee rehabilitation.    CONSTIPATION  Constipation is defined medically as fewer than three stools per week and severe constipation as less than one stool per week.  Even if you have a regular bowel pattern at home, your normal regimen is likely to be disrupted due to multiple reasons following surgery.  Combination of anesthesia, postoperative narcotics, change in appetite and fluid intake all can affect your bowels.   YOU MUST use at least one of the following options; they are listed in order of increasing strength to get the job done.  They are all available over the counter, and you may need to use some, POSSIBLY even all of these options:    Drink plenty of fluids (prune juice may be helpful) and high fiber foods Colace 100 mg by mouth twice a day  Senokot for constipation as directed and as needed Dulcolax (bisacodyl), take with full glass of water  Miralax (polyethylene glycol) once or twice a day as needed.  If you have tried all these things and are unable to have a bowel movement in the first 3-4 days after surgery call either your surgeon or your primary doctor.    If you experience loose stools or diarrhea, hold the medications until you stool forms back up.  If your symptoms do not get better within 1 week or if they get worse, check with your doctor.  If you experience "the worst abdominal pain ever" or develop nausea or vomiting, please contact the office immediately for further  recommendations for treatment.   ITCHING:  If you experience itching with your medications, try taking only a single pain pill, or even half a pain pill at a time.  You can also use Benadryl over the counter for itching or also to help with sleep.   TED HOSE STOCKINGS:  Use stockings on both legs until for at least 2 weeks or as directed by physician office. They may be removed at night for sleeping.  MEDICATIONS:  See your medication summary on the "After Visit Summary" that nursing will review with you.  You may have some home medications which will be placed on hold until you complete the course of blood thinner medication.  It is important for you to complete the blood thinner medication as prescribed.  PRECAUTIONS:  If you experience chest pain or shortness of breath - call 911 immediately for transfer to the hospital emergency department.   If you develop a fever greater that 101 F, purulent drainage from wound, increased redness or drainage from wound, foul odor from the wound/dressing, or calf pain - CONTACT YOUR SURGEON.  FOLLOW-UP APPOINTMENTS:  If you do not already have a post-op appointment, please call the office for an appointment to be seen by your surgeon.  Guidelines for how soon to be seen are listed in your "After Visit Summary", but are typically between 1-4 weeks after surgery.  OTHER INSTRUCTIONS:   Knee Replacement:  Do not place pillow under knee, focus on keeping the knee straight while resting. CPM instructions: 0-90 degrees, 2 hours in the morning, 2 hours in the afternoon, and 2 hours in the evening. Place foam block, curve side up under heel at all times except when in CPM or when walking.  DO NOT modify, tear, cut, or change the foam block in any way.  MAKE SURE YOU:  Understand these instructions.  Get help right away if you are not doing well or get worse.    Thank you for letting us be a part of your medical  care team.  It is a privilege we respect greatly.  We hope these instructions will help you stay on track for a fast and full recovery!   Follow the hip precautions as taught in Physical Therapy    Complete by:  As directed    Increase activity slowly as tolerated    Complete by:  As directed    Patient may shower    Complete by:  As directed    YOU MAY SHOWER OVER THE DRESSING.  DO NOT REMOVE DRESSING   TED hose    Complete by:  As directed    Use stockings (TED hose) for 2 weeks on RIGHT leg(s).  You may remove them at night for sleeping.   Weight bearing as tolerated    Complete by:  As directed    Laterality:  right   Extremity:  Lower      DISCHARGE MEDICATIONS:   Allergies as of 12/25/2016   No Known Allergies     Medication List    STOP taking these medications   acetaminophen 325 MG tablet Commonly known as:  TYLENOL   enoxaparin 30 MG/0.3ML injection Commonly known as:  LOVENOX   levofloxacin 500 MG tablet Commonly known as:  LEVAQUIN   OVER THE COUNTER MEDICATION   senna-docusate 8.6-50 MG tablet Commonly known as:  Senokot-S     TAKE these medications   albuterol (2.5 MG/3ML) 0.083% nebulizer solution Commonly known as:  PROVENTIL Take 3 mLs (2.5 mg total) by nebulization every 6 (six) hours as needed for wheezing or shortness of breath. What changed:  when to take this  additional instructions   aspirin 325 MG EC tablet Take 1 tablet (325 mg total) by mouth daily with breakfast. Start taking on:  12/26/2016   atorvastatin 20 MG tablet Commonly known as:  LIPITOR Take 20 mg by mouth at bedtime.   bisacodyl 5 MG EC tablet Commonly known as:  DULCOLAX Take 1 tablet (5 mg total) by mouth daily as needed for moderate constipation.   CALCIUM 600 + D PO Take 1 tablet by mouth 2 (two) times daily.   ceFAZolin 2-4 GM/100ML-% IVPB Commonly known as:  ANCEF Inject 100 mLs (2 g total) into the vein every 8 (eight) hours.   citalopram 20 MG  tablet Commonly known as:  CELEXA Take 20 mg by mouth at bedtime.   dextromethorphan-guaiFENesin 30-600 MG 12hr tablet Commonly known as:  MUCINEX DM Take 1 tablet by mouth 2 (two) times daily.   donepezil 10 MG tablet Commonly known as:  ARICEPT Take 10  mg by mouth at bedtime.   feeding supplement (ENSURE ENLIVE) Liqd Take 237 mLs by mouth 2 (two) times daily between meals.   Fish Oil 1000 MG Caps Take 1,000 mg by mouth at bedtime.   guaiFENesin 600 MG 12 hr tablet Commonly known as:  MUCINEX Take 600 mg by mouth 2 (two) times daily.   HYDROcodone-acetaminophen 5-325 MG tablet Commonly known as:  NORCO Take 1 tablet by mouth every 6 (six) hours as needed for moderate pain.   ICAPS MV Tabs Take 1 tablet by mouth at bedtime.   memantine 5 MG tablet Commonly known as:  NAMENDA Take 5 mg by mouth at bedtime.   mupirocin ointment 2 % Commonly known as:  BACTROBAN Place 1 application into the nose 2 (two) times daily.   predniSONE 20 MG tablet Commonly known as:  DELTASONE Take 1 tablet (20 mg total) by mouth daily with breakfast. Start taking on:  12/26/2016   rifampin 300 MG capsule Commonly known as:  RIFADIN Take 1 capsule (300 mg total) by mouth every 12 (twelve) hours.   senna 8.6 MG tablet Commonly known as:  SENOKOT Take 1 tablet by mouth at bedtime as needed for constipation.   timolol 0.25 % ophthalmic solution Commonly known as:  BETIMOL Place 1 drop into both eyes 2 (two) times daily.   timolol 0.25 % ophthalmic solution Commonly known as:  TIMOPTIC Place 1 drop into both eyes 2 (two) times daily.   Vitamin D 2000 units Caps Take 4,000 Units by mouth at bedtime.       FOLLOW UP VISIT:   Follow-up Information    Eldred Manges, MD. Schedule an appointment as soon as possible for a visit in 1 week(s).   Specialty:  Orthopedic Surgery Contact information: [address] [place] [address] [phone]           DISPOSITION:    Skilled Nursing Facility/Rehab  CONDITION:  Stable   Oris Drone. Aleda Grana [facility] [phone]  12/25/2016 10:47 AM

## 2016-12-25 NOTE — Progress Notes (Signed)
PATIENT ID: Gabrielle [name]        MRN:  [id number]          DOB/AGE: 12/13/1926 / 83 y.o.    [name], MD   [name], PA-C [address] [place], [place]  [address]                             [phone]   PROGRESS NOTE  Subjective:  negative for Chest Pain  negative for Shortness of Breath  negative for Nausea/Vomiting   negative for Calf Pain    Tolerating Diet: yes         Patient reports pain as mild.     Comfortable,awake,alert  Objective: Vital signs in last 24 hours:   Patient Vitals for the past 24 hrs:  BP Temp Temp src Pulse Resp SpO2  12/25/16 0427 (!) 139/48 98.6 F (37 C) Oral 65 16 95 %  12/24/16 2123 110/62 98.5 F (36.9 C) Oral 70 16 96 %  12/24/16 1501 (!) 109/40 98.8 F (37.1 C) Oral 64 16 96 %      Intake/Output from previous day:   02/14 0701 - 02/15 0700 In: 720 [P.O.:720] Out: 250 [Urine:200; Drains:50]   Intake/Output this shift:   No intake/output data recorded.   Intake/Output      02/14 0701 - 02/15 0700 02/15 0701 - 02/16 0700   P.O. 720    Total Intake 720     Urine 200    Drains 50    Total Output 250     Net +470             LABORATORY DATA:  Recent Labs  12/22/16 1432 12/23/16 0316 12/24/16 0356 12/25/16 0342  WBC 14.5* 14.2* 10.3 8.9  HGB 11.4* 9.1* 9.0* 9.5*  HCT 36.2 29.5* 28.5* 31.1*  PLT 394 316 290 319    Recent Labs  12/22/16 1432 12/23/16 0316 12/24/16 0356 12/25/16 0342  NA 136 134* 136 137  K 3.9 3.8 4.8 3.9  CL 97* 98* 98* 100*  CO2 29 28 31 31   BUN 11 11 12 13   CREATININE 0.77 0.67 0.72 0.56  GLUCOSE 133* 97 100* 81  CALCIUM 9.8 8.8* 8.9 9.0   Lab Results  Component Value Date   INR 1.30 12/22/2016   INR 1.10 12/05/2016   INR 1.08 08/30/2016    Recent Radiographic Studies :  Dg Chest 1 View  Result Date: 12/05/2016 CLINICAL DATA:  Preoperative chest radiograph, for hip fracture. Cough. Initial encounter. EXAM: CHEST 1 VIEW COMPARISON:  Chest radiograph performed  08/30/2016 FINDINGS: The lungs are well-aerated and clear. There is no evidence of focal opacification, pleural effusion or pneumothorax. The cardiomediastinal silhouette is borderline normal in size. No acute osseous abnormalities are seen. Right convex thoracic scoliosis is noted. IMPRESSION: No acute cardiopulmonary process seen. Right convex thoracic scoliosis noted. No displaced rib fractures identified. Electronically Signed   By: Roanna Raider M.D.   On: 12/05/2016 19:13   Ct Head Wo Contrast  Result Date: 12/05/2016 CLINICAL DATA:  Dizziness with subsequent fall. EXAM: CT HEAD WITHOUT CONTRAST CT CERVICAL SPINE WITHOUT CONTRAST TECHNIQUE: Multidetector CT imaging of the head and cervical spine was performed following the standard protocol without intravenous contrast. Multiplanar CT image reconstructions of the cervical spine were also generated. COMPARISON:  08/03/2015 FINDINGS: CT HEAD FINDINGS Brain: Ventricles and cisterns are within normal. There is mild age related atrophic change. There  is no mass, mass effect, shift of midline structures or acute hemorrhage. No evidence of acute infarction. Minimal chronic ischemic microvascular disease. Vascular: Mild plaque over the cavernous segment of the internal carotid arteries. Skull: Within normal. Sinuses/Orbits: Orbits are within normal. Paranasal sinuses are well developed and well aerated without air-fluid levels or significant opacification. Other: None. CT CERVICAL SPINE FINDINGS Alignment:  Within normal. Skull base and vertebrae: Vertebral body alignment and heights are within normal. There is mild spondylosis throughout the cervical spine. There is moderate uncovertebral joint spurring and mild facet arthropathy. No acute fracture. Soft tissues and spinal canal: Prevertebral soft tissues are within normal. Spinal canal is within normal. Disc levels: Mild disc space narrowing at the C3-4, C4-5, C5-6 and C6-7 levels. Upper chest: Within normal.  Other: None. IMPRESSION: No acute intracranial findings. Minimal chronic ischemic microvascular disease. No acute cervical spine injury. Mild spondylosis throughout the cervical spine with mild multilevel disc disease. Electronically Signed   By: Elberta Fortisaniel  Boyle M.D.   On: 12/05/2016 19:02   Ct Cervical Spine Wo Contrast  Result Date: 12/05/2016 CLINICAL DATA:  Dizziness with subsequent fall. EXAM: CT HEAD WITHOUT CONTRAST CT CERVICAL SPINE WITHOUT CONTRAST TECHNIQUE: Multidetector CT imaging of the head and cervical spine was performed following the standard protocol without intravenous contrast. Multiplanar CT image reconstructions of the cervical spine were also generated. COMPARISON:  08/03/2015 FINDINGS: CT HEAD FINDINGS Brain: Ventricles and cisterns are within normal. There is mild age related atrophic change. There is no mass, mass effect, shift of midline structures or acute hemorrhage. No evidence of acute infarction. Minimal chronic ischemic microvascular disease. Vascular: Mild plaque over the cavernous segment of the internal carotid arteries. Skull: Within normal. Sinuses/Orbits: Orbits are within normal. Paranasal sinuses are well developed and well aerated without air-fluid levels or significant opacification. Other: None. CT CERVICAL SPINE FINDINGS Alignment:  Within normal. Skull base and vertebrae: Vertebral body alignment and heights are within normal. There is mild spondylosis throughout the cervical spine. There is moderate uncovertebral joint spurring and mild facet arthropathy. No acute fracture. Soft tissues and spinal canal: Prevertebral soft tissues are within normal. Spinal canal is within normal. Disc levels: Mild disc space narrowing at the C3-4, C4-5, C5-6 and C6-7 levels. Upper chest: Within normal. Other: None. IMPRESSION: No acute intracranial findings. Minimal chronic ischemic microvascular disease. No acute cervical spine injury. Mild spondylosis throughout the cervical spine  with mild multilevel disc disease. Electronically Signed   By: Elberta Fortisaniel  Boyle M.D.   On: 12/05/2016 19:02   Dg Pelvis Portable  Result Date: 12/22/2016 CLINICAL DATA:  Status post debridement of right hip. Assess for dislocation. Initial encounter. EXAM: PORTABLE PELVIS 1-2 VIEWS COMPARISON:  Right hip radiographs performed earlier today at 11:21 a.m. FINDINGS: There is no evidence of dislocation at this time. Both hip arthroplasties are grossly unremarkable in appearance, without evidence of loosening. There is no evidence of fracture. Postoperative air is seen about the right hip, with associated drainage catheter. IMPRESSION: No evidence of dislocation at this time. Bilateral hip arthroplasties are unremarkable in appearance. Electronically Signed   By: Roanna RaiderJeffery  Chang M.D.   On: 12/22/2016 20:44   Pelvis Portable  Result Date: 12/06/2016 CLINICAL DATA:  Status post right hip arthroplasty. EXAM: PORTABLE PELVIS 1-2 VIEWS COMPARISON:  08/31/2016 FINDINGS: New right hip hemiarthroplasty appears well-seated and aligned. Left hemiarthroplasty is stable, also well-seated and aligned. No acute fracture or evidence of an operative complication. IMPRESSION: Well-positioned right hip hemiarthroplasty. Electronically Signed   By:  Amie Portland M.D.   On: 12/06/2016 09:50   Dg Chest Port 1 View  Result Date: 12/22/2016 CLINICAL DATA:  Preop testing right hip surgery EXAM: PORTABLE CHEST 1 VIEW COMPARISON:  12/09/2016 FINDINGS: Lungs remain clear without infiltrate or effusion. Negative for heart failure. Atherosclerotic calcification aortic arch. Marked dextroscoliosis thoracic spine unchanged. IMPRESSION: No active disease. Electronically Signed   By: Marlan Palau M.D.   On: 12/22/2016 14:16   Dg Chest Port 1 View  Result Date: 12/09/2016 CLINICAL DATA:  Shortness of breath, cough EXAM: PORTABLE CHEST 1 VIEW COMPARISON:  12/05/2016 FINDINGS: There is hyperinflation of the lungs compatible with COPD. Left  lower lobe atelectasis. Right lung is clear. No effusions. Heart is mildly enlarged. Tortuosity of the thoracic aorta with calcifications. Severe thoracolumbar scoliosis. IMPRESSION: COPD.  Left base atelectasis. Electronically Signed   By: Charlett Nose M.D.   On: 12/09/2016 12:41   Dg Hip Port Unilat With Pelvis 1v Right  Result Date: 12/22/2016 CLINICAL DATA:  Status post debridement of right hip. Assess for dislocation. Initial encounter. EXAM: DG HIP (WITH OR WITHOUT PELVIS) 1V PORT RIGHT COMPARISON:  Right hip radiographs performed earlier today at 11:21 a.m. FINDINGS: There is no evidence of dislocation on the provided cross-table lateral view. There is no evidence of loosening. A drainage catheter is noted overlying the right hip. The right hip arthroplasty is grossly unremarkable in appearance. IMPRESSION: No evidence of dislocation. Right hip arthroplasty is grossly unremarkable in appearance. Electronically Signed   By: Roanna Raider M.D.   On: 12/22/2016 20:45   Dg Hip Unilat  With Pelvis 2-3 Views Right  Result Date: 12/05/2016 CLINICAL DATA:  Acute onset of syncope and fall. Right hip pain. Initial encounter. EXAM: DG HIP (WITH OR WITHOUT PELVIS) 2-3V RIGHT COMPARISON:  None. FINDINGS: There is a mildly displaced subcapital fracture through the right femoral neck, with superior displacement of the distal femur. The right femoral head remains seated at the acetabulum. The left hip arthroplasty is grossly unremarkable. Mild degenerative change is noted at the lower lumbar spine. The sacroiliac joints are unremarkable in appearance. The visualized bowel gas pattern is grossly unremarkable in appearance. IMPRESSION: Mildly displaced subcapital fracture through the right femoral neck, with superior displacement of the distal femur. Electronically Signed   By: Roanna Raider M.D.   On: 12/05/2016 17:58     Examination:  General appearance: alert, cooperative and no distress  Wound Exam:  clean, dry, intact   Drainage:  None: wound tissue dry  Motor Exam: EHL, FHL, Anterior Tibial and Posterior Tibial Intact  Sensory Exam: Superficial Peroneal, Deep Peroneal and Tibial normal  Vascular Exam: Normal  Assessment:    3 Days Post-Op  Procedure(s) (LRB): IRRIGATION AND DEBRIDEMENT HIP WITH ANTIBIOTIC BEAD APPLICATION (Right)  ADDITIONAL DIAGNOSIS:  Principal Problem:   Infection and inflammatory reaction due to internal right hip prosthesis, initial encounter (HCC) Active Problems:   Postoperative wound infection of right hip   S/P total hip arthroplasty   Prosthetic hip infection (HCC)   Staphylococcus aureus infection   Alzheimer's dementia with behavioral disturbance  Acute Blood Loss Anemia-stable, asymptomatic   Plan: Physical Therapy as ordered Weight Bearing as Tolerated (WBAT)  DVT Prophylaxis:  Aspirin, Foot Pumps and TED hose  DISCHARGE PLAN: Skilled Nursing Facility/Rehab  DISCHARGE NEEDS: has equipment Cultures growing MSSA-discussed with ID--D/C vancomycin and continue IV Ancef and po rifampin-will plan on discharge to [facility] today after pic line insertion.Urine culture neg  Valeria Batman  12/25/2016 10:16 AM  Patient ID: Gabrielle [name], female   DOB: May 18, 1927, 83 y.o.   MRN: [id number]

## 2016-12-25 NOTE — Progress Notes (Signed)
Report given to [place]-RN at [facility]. All questions/concerns addressed

## 2016-12-26 DIAGNOSIS — D649 Anemia, unspecified: Secondary | ICD-10-CM | POA: Diagnosis not present

## 2016-12-26 DIAGNOSIS — T8451XA Infection and inflammatory reaction due to internal right hip prosthesis, initial encounter: Secondary | ICD-10-CM | POA: Diagnosis not present

## 2016-12-26 DIAGNOSIS — G301 Alzheimer's disease with late onset: Secondary | ICD-10-CM | POA: Diagnosis not present

## 2016-12-26 DIAGNOSIS — F325 Major depressive disorder, single episode, in full remission: Secondary | ICD-10-CM | POA: Diagnosis not present

## 2016-12-27 LAB — AEROBIC/ANAEROBIC CULTURE W GRAM STAIN (SURGICAL/DEEP WOUND)

## 2016-12-27 LAB — ANAEROBIC CULTURE

## 2016-12-27 LAB — AEROBIC/ANAEROBIC CULTURE (SURGICAL/DEEP WOUND)

## 2016-12-29 LAB — ANAEROBIC CULTURE
Gram Stain: NONE SEEN
Gram Stain: NONE SEEN

## 2016-12-30 ENCOUNTER — Ambulatory Visit (INDEPENDENT_AMBULATORY_CARE_PROVIDER_SITE_OTHER): Payer: Medicare Other | Admitting: Orthopaedic Surgery

## 2016-12-30 ENCOUNTER — Encounter (INDEPENDENT_AMBULATORY_CARE_PROVIDER_SITE_OTHER): Payer: Self-pay | Admitting: Orthopaedic Surgery

## 2016-12-30 DIAGNOSIS — IMO0001 Reserved for inherently not codable concepts without codable children: Secondary | ICD-10-CM

## 2016-12-30 DIAGNOSIS — T814XXD Infection following a procedure, subsequent encounter: Secondary | ICD-10-CM

## 2016-12-30 NOTE — Progress Notes (Signed)
Post-Op Visit Note   Patient: Gabrielle [name]           Date of Birth: 02/09/1927           MRN: [id number] Visit Date: 12/30/2016 PCP: [name],[name], MD   Assessment & Plan:  Chief Complaint: No chief complaint on file.  Visit Diagnoses:  1. Postoperative wound infection of right hip, subsequent encounter     Plan: Continue Ancef and rifampin per infectious disease. Follow-up in one week with Dr. Ophelia CharterYates wound check and possible suture removal. Continue physical therapy.  Follow-Up Instructions: Return in about 1 week (around 01/06/2017) for for wound check and possible suture removal.   Orders:  No orders of the defined types were placed in this encounter.  No orders of the defined types were placed in this encounter.  HPI Patient here for total hip replacement 12/06/16. Saw Dr.Whitfield on 12/22/16 and did I &D a & antibiotic bead placement. No problems since.  Imaging: No results found.  PMFS History: Patient Active Problem List   Diagnosis Date Noted  . Prosthetic hip infection (HCC)   . Staphylococcus aureus infection   . Alzheimer's dementia with behavioral disturbance   . Postoperative wound infection of right hip 12/22/2016  . Infection and inflammatory reaction due to internal right hip prosthesis, initial encounter (HCC) 12/22/2016  . S/P total hip arthroplasty 12/22/2016  . SOB (shortness of breath)   . Influenza A 12/06/2016  . Closed fracture of right hip (HCC) 12/05/2016  . Dementia 12/05/2016  . Fall 12/05/2016  . Cough 12/05/2016  . S/P hip hemiarthroplasty 09/16/2016  . Mallet finger, left small finger 09/16/2016  . Hip fracture (HCC) 08/30/2016  . Vascular dementia 08/30/2016  . Osteoporosis 08/30/2016  . Vaginal candida 08/30/2016  . UTI (urinary tract infection) 08/30/2016  . Mild aortic insufficiency: Per 2 d echo 08/04/2015 08/04/2015  . Moderate mitral regurgitation: Per 2 d echo 08/04/2015 08/04/2015  . Glaucoma 08/04/2015  .  Dizziness 08/04/2015  . Syncope 08/03/2015  . Depression 12/09/2013  . Constipation 11/30/2013  . Pure hypercholesterolemia 11/30/2013  . Acute posthemorrhagic anemia 11/30/2013  . OA (osteoarthritis) of knee 11/21/2013   Past Medical History:  Diagnosis Date  . Arthritis    oa  . Glaucoma 08/04/2015  . Hyperlipidemia   . Mild aortic insufficiency: Per 2 d echo 08/04/2015 08/04/2015  . Moderate mitral regurgitation: Per 2 d echo 08/04/2015 08/04/2015    No family history on file.  Past Surgical History:  Procedure Laterality Date  . ABDOMINAL HYSTERECTOMY    . benign breast tumor removed  age 83  . HIP ARTHROPLASTY Left 08/31/2016   Procedure: ARTHROPLASTY  HIP (HEMIARTHROPLASTY);  Surgeon: Eldred MangesMark C Yates, MD;  [facility];  Service: Orthopedics;  Laterality: Left;  . HIP ARTHROPLASTY Right 12/06/2016   Procedure: ARTHROPLASTY BIPOLAR HIP (HEMIARTHROPLASTY);  Surgeon: Eldred MangesMark C Yates, MD;  Location: [facility] ORS;  Service: Orthopedics;  Laterality: Right;  . INCISION AND DRAINAGE HIP Right 12/22/2016   Procedure: IRRIGATION AND DEBRIDEMENT HIP WITH ANTIBIOTIC BEAD APPLICATION;  Surgeon: Valeria BatmanPeter W Whitfield, MD;  Location: [facility] OR;  Service: Orthopedics;  Laterality: Right;  . TONSILLECTOMY  age 83  . TOTAL KNEE ARTHROPLASTY Right 11/21/2013   Procedure: RIGHT TOTAL KNEE ARTHROPLASTY;  Surgeon: Loanne DrillingFrank V Aluisio, MD;  Location: [facility] ORS;  Service: Orthopedics;  Laterality: Right;  . WRIST FRACTURE SURGERY Right    Social History   Occupational History  . Not on file.   Social History  Main Topics  . Smoking status: Former Smoker    Packs/day: 1.00    Years: 17.00    Types: Cigarettes    Quit date: 11/10/1965  . Smokeless tobacco: Never Used  . Alcohol use Yes     Comment: very occasional wine  . Drug use: No  . Sexual activity: Not on file

## 2017-01-02 ENCOUNTER — Telehealth: Payer: Self-pay | Admitting: Internal Medicine

## 2017-01-02 NOTE — Telephone Encounter (Signed)
Pt came in office, with a yellow sheet from [facility], stating pt had an apt with [facility] on 01-02-17 @ 10:30. There was no pending apt in ous system for pt, nor no record of canceled or missed appointments previously. Pt would be considered a new patient, and would need a consult visit. I made pt and pt's son aware of this and made them aware that there were no available apt for today. I spoke with Nurse Supervisor at [facility], who was unaware of this apt. I offered to scheduled pt for consult. Pt's son states he would like to discuss this with [facility] physician, as it sounds sounds like a misunderstanding. Pt's son states if physician thinks pt should be followed by pulmonologist, he will then call to scheduled consult visit. Nothing further needed at this time.

## 2017-01-06 ENCOUNTER — Encounter (INDEPENDENT_AMBULATORY_CARE_PROVIDER_SITE_OTHER): Payer: Self-pay

## 2017-01-06 ENCOUNTER — Encounter (INDEPENDENT_AMBULATORY_CARE_PROVIDER_SITE_OTHER): Payer: Self-pay | Admitting: Orthopaedic Surgery

## 2017-01-06 ENCOUNTER — Ambulatory Visit (INDEPENDENT_AMBULATORY_CARE_PROVIDER_SITE_OTHER): Payer: Medicare Other | Admitting: Orthopaedic Surgery

## 2017-01-06 VITALS — BP 122/65 | HR 70 | Ht 69.0 in | Wt 160.0 lb

## 2017-01-06 DIAGNOSIS — T814XXD Infection following a procedure, subsequent encounter: Secondary | ICD-10-CM

## 2017-01-06 DIAGNOSIS — IMO0001 Reserved for inherently not codable concepts without codable children: Secondary | ICD-10-CM

## 2017-01-06 NOTE — Progress Notes (Signed)
Post-Op Visit Note   Patient: Gabrielle [name]           Date of Birth: 10/23/1927           MRN: [id number] Visit Date: 01/06/2017 PCP: [name],[name], MD   Assessment & Plan:  Chief Complaint:  Chief Complaint  Patient presents with  . Right Hip - Open Wound, Follow-up   Visit Diagnoses: No diagnosis found.  Plan: Follow-up in one week for wound check and possible suture removal. Daily dressing changes. Continue physical therapy at the skilled facility.  Follow-Up Instructions: Return in about 1 week (around 01/13/2017).   Orders:  No orders of the defined types were placed in this encounter.  No orders of the defined types were placed in this encounter.  HPI: Patient here for 1 week follow up of right hip and possible suture removal. Had I & D of the hip, had antibiotic bead placement on 12-22-16. Still taking antiobiotics.  Imaging: No results found.  PMFS History: Patient Active Problem List   Diagnosis Date Noted  . Prosthetic hip infection (HCC)   . Staphylococcus aureus infection   . Alzheimer's dementia with behavioral disturbance   . Postoperative wound infection of right hip 12/22/2016  . Infection and inflammatory reaction due to internal right hip prosthesis, initial encounter (HCC) 12/22/2016  . S/P total hip arthroplasty 12/22/2016  . SOB (shortness of breath)   . Influenza A 12/06/2016  . Closed fracture of right hip (HCC) 12/05/2016  . Dementia 12/05/2016  . Fall 12/05/2016  . Cough 12/05/2016  . S/P hip hemiarthroplasty 09/16/2016  . Mallet finger, left small finger 09/16/2016  . Hip fracture (HCC) 08/30/2016  . Vascular dementia 08/30/2016  . Osteoporosis 08/30/2016  . Vaginal candida 08/30/2016  . UTI (urinary tract infection) 08/30/2016  . Mild aortic insufficiency: Per 2 d echo 08/04/2015 08/04/2015  . Moderate mitral regurgitation: Per 2 d echo 08/04/2015 08/04/2015  . Glaucoma 08/04/2015  . Dizziness 08/04/2015  . Syncope 08/03/2015    . Depression 12/09/2013  . Constipation 11/30/2013  . Pure hypercholesterolemia 11/30/2013  . Acute posthemorrhagic anemia 11/30/2013  . OA (osteoarthritis) of knee 11/21/2013   Past Medical History:  Diagnosis Date  . Arthritis    oa  . Glaucoma 08/04/2015  . Hyperlipidemia   . Mild aortic insufficiency: Per 2 d echo 08/04/2015 08/04/2015  . Moderate mitral regurgitation: Per 2 d echo 08/04/2015 08/04/2015    No family history on file.  Past Surgical History:  Procedure Laterality Date  . ABDOMINAL HYSTERECTOMY    . benign breast tumor removed  age 83  . HIP ARTHROPLASTY Left 08/31/2016   Procedure: ARTHROPLASTY  HIP (HEMIARTHROPLASTY);  Surgeon: Eldred MangesMark C Yates, MD;  [facility];  Service: Orthopedics;  Laterality: Left;  . HIP ARTHROPLASTY Right 12/06/2016   Procedure: ARTHROPLASTY BIPOLAR HIP (HEMIARTHROPLASTY);  Surgeon: Eldred MangesMark C Yates, MD;  Location: [facility];  Service: Orthopedics;  Laterality: Right;  . INCISION AND DRAINAGE HIP Right 12/22/2016   Procedure: IRRIGATION AND DEBRIDEMENT HIP WITH ANTIBIOTIC BEAD APPLICATION;  Surgeon: Valeria BatmanPeter W Whitfield, MD;  Location: [facility] OR;  Service: Orthopedics;  Laterality: Right;  . TONSILLECTOMY  age 83  . TOTAL KNEE ARTHROPLASTY Right 11/21/2013   Procedure: RIGHT TOTAL KNEE ARTHROPLASTY;  Surgeon: Loanne DrillingFrank V Aluisio, MD;  Location: [facility];  Service: Orthopedics;  Laterality: Right;  . WRIST FRACTURE SURGERY Right    Social History   Occupational History  . Not on file.   Social History Main  Topics  . Smoking status: Former Smoker    Packs/day: 1.00    Years: 17.00    Types: Cigarettes    Quit date: 11/10/1965  . Smokeless tobacco: Never Used  . Alcohol use Yes     Comment: very occasional wine  . Drug use: No  . Sexual activity: Not on file   Exam: No acute distress. Patient is alert and oriented. Patient does have mild serous drainage from her wound. Nothing purulent. No gross signs of infection. Sutures are intact.

## 2017-01-08 ENCOUNTER — Telehealth (INDEPENDENT_AMBULATORY_CARE_PROVIDER_SITE_OTHER): Payer: Self-pay | Admitting: Orthopaedic Surgery

## 2017-01-08 NOTE — Telephone Encounter (Signed)
I called discussed. ROV 1 :!5   PM tomorrow thanks

## 2017-01-08 NOTE — Telephone Encounter (Signed)
Can you please add this patient to the beginning of Dr. Ophelia CharterYates schedule tomorrow? She has to be seen. She is post op. Thanks.

## 2017-01-08 NOTE — Telephone Encounter (Signed)
Nikki from [facility] called regarding the patients surgical wound. She says theres a blister/boil forming and lots of drainage. CB # [phone]

## 2017-01-08 NOTE — Telephone Encounter (Signed)
DONE

## 2017-01-08 NOTE — Telephone Encounter (Signed)
Please advise 

## 2017-01-09 ENCOUNTER — Encounter (INDEPENDENT_AMBULATORY_CARE_PROVIDER_SITE_OTHER): Payer: Self-pay

## 2017-01-09 ENCOUNTER — Ambulatory Visit (INDEPENDENT_AMBULATORY_CARE_PROVIDER_SITE_OTHER): Payer: Medicare Other | Admitting: Orthopaedic Surgery

## 2017-01-09 DIAGNOSIS — S72002D Fracture of unspecified part of neck of left femur, subsequent encounter for closed fracture with routine healing: Secondary | ICD-10-CM

## 2017-01-09 DIAGNOSIS — Z96649 Presence of unspecified artificial hip joint: Secondary | ICD-10-CM

## 2017-01-09 DIAGNOSIS — T8459XD Infection and inflammatory reaction due to other internal joint prosthesis, subsequent encounter: Secondary | ICD-10-CM

## 2017-01-09 NOTE — Progress Notes (Signed)
Office Visit Note   Patient: Gabrielle [name]           Date of Birth: [birth year]           MRN: [id number] Visit Date: 01/09/2017              Requested by: Gweneth Dimitri, MD [address] [place], [place] [address] PCP: Gweneth Dimitri, MD   Assessment & Plan: Visit Diagnoses:  1. Closed fracture of left hip with routine healing, subsequent encounter   2. Infection of prosthetic hip joint, subsequent encounter     Plan: Dressings changes and granulation tissue between the last distal 2 most sutures. Some silver nitrate was applied to this. The rest her incision looks good we left the sutures in. Her drainage is clear serous there is no purulence. Intraoperative cultures grew staph aureus sensitive to every antibiotic except for penicillin. She is on appropriate antibiotics at this point and I discussed with patient and also her son who is with her today when we changed her dressing that it this continues to drain after the time. For the absorbable antibiotic beads to have dissolved away then we could consider placing an antibiotic spacer and then later coming back and revising it. We rediscuss that with the  antibiotic beads wounds there is local tissue irritation and it always continues to drain serous fluid until the beats of finally dissolved. She has an appointment with Dr. Daiva Eves from the infectious disease department who is following her closely as well. If she develops any fever or purulent drainage they will contact me otherwise I will recheck her again in 11 days for incision check. Patient originally broke one hip in the fall and then unfortunately fell and broke the right hip. Albumin level is low we discussed the importance of increasing her caloric intake particularly protein to help heal postoperatively. Questions were elicited and answered. She and her son will try to work on some increase protein intake.  Follow-Up Instructions: No Follow-up on file.   Orders:  No  orders of the defined types were placed in this encounter.  No orders of the defined types were placed in this encounter.     Procedures: No procedures performed   Clinical Data: No additional findings.   Subjective: Chief Complaint  Patient presents with  . Right Hip - Follow-up    Patient is here for a right hip wound check post-op. Patient complains of pain on lateral side of right hip. Patient states she has not noticed any redness or fever to incision.    Review of Systems 14 point review of systems unchanged. She is getting dressing changes daily with serous drainage.   Objective: Vital Signs: There were no vitals taken for this visit.  Physical Exam leg lengths are equal no cellulitis. Sutures are intact.  Ortho Exam  Specialty Comments:  No specialty comments available.  Imaging: No results found.   PMFS History: Patient Active Problem List   Diagnosis Date Noted  . Prosthetic hip infection (HCC)   . Staphylococcus aureus infection   . Alzheimer's dementia with behavioral disturbance   . Postoperative wound infection of right hip 12/22/2016  . Infection and inflammatory reaction due to internal right hip prosthesis, initial encounter (HCC) 12/22/2016  . S/P total hip arthroplasty 12/22/2016  . SOB (shortness of breath)   . Influenza A 12/06/2016  . Closed fracture of right hip (HCC) 12/05/2016  . Dementia 12/05/2016  . Fall 12/05/2016  . Cough 12/05/2016  .  S/P hip hemiarthroplasty 09/16/2016  . Mallet finger, left small finger 09/16/2016  . Hip fracture (HCC) 08/30/2016  . Vascular dementia 08/30/2016  . Osteoporosis 08/30/2016  . Vaginal candida 08/30/2016  . UTI (urinary tract infection) 08/30/2016  . Mild aortic insufficiency: Per 2 d echo 08/04/2015 08/04/2015  . Moderate mitral regurgitation: Per 2 d echo 08/04/2015 08/04/2015  . Glaucoma 08/04/2015  . Dizziness 08/04/2015  . Syncope 08/03/2015  . Depression 12/09/2013  . Constipation  11/30/2013  . Pure hypercholesterolemia 11/30/2013  . Acute posthemorrhagic anemia 11/30/2013  . OA (osteoarthritis) of knee 11/21/2013   Past Medical History:  Diagnosis Date  . Arthritis    oa  . Glaucoma 08/04/2015  . Hyperlipidemia   . Mild aortic insufficiency: Per 2 d echo 08/04/2015 08/04/2015  . Moderate mitral regurgitation: Per 2 d echo 08/04/2015 08/04/2015    No family history on file.  Past Surgical History:  Procedure Laterality Date  . ABDOMINAL HYSTERECTOMY    . benign breast tumor removed  age 83  . HIP ARTHROPLASTY Left 08/31/2016   Procedure: ARTHROPLASTY  HIP (HEMIARTHROPLASTY);  Surgeon: Eldred MangesMark C Deshonna Trnka, MD;  [facility];  Service: Orthopedics;  Laterality: Left;  . HIP ARTHROPLASTY Right 12/06/2016   Procedure: ARTHROPLASTY BIPOLAR HIP (HEMIARTHROPLASTY);  Surgeon: Eldred MangesMark C Mindel Friscia, MD;  Location: [facility] ORS;  Service: Orthopedics;  Laterality: Right;  . INCISION AND DRAINAGE HIP Right 12/22/2016   Procedure: IRRIGATION AND DEBRIDEMENT HIP WITH ANTIBIOTIC BEAD APPLICATION;  Surgeon: Valeria BatmanPeter W Whitfield, MD;  Location: [facility] OR;  Service: Orthopedics;  Laterality: Right;  . TONSILLECTOMY  age 83  . TOTAL KNEE ARTHROPLASTY Right 11/21/2013   Procedure: RIGHT TOTAL KNEE ARTHROPLASTY;  Surgeon: Loanne DrillingFrank V Aluisio, MD;  Location: [facility] ORS;  Service: Orthopedics;  Laterality: Right;  . WRIST FRACTURE SURGERY Right    Social History   Occupational History  . Not on file.   Social History Main Topics  . Smoking status: Former Smoker    Packs/day: 1.00    Years: 17.00    Types: Cigarettes    Quit date: 11/10/1965  . Smokeless tobacco: Never Used  . Alcohol use Yes     Comment: very occasional wine  . Drug use: No  . Sexual activity: Not on file

## 2017-01-13 ENCOUNTER — Ambulatory Visit (INDEPENDENT_AMBULATORY_CARE_PROVIDER_SITE_OTHER): Payer: Medicare Other | Admitting: Surgery

## 2017-01-20 ENCOUNTER — Ambulatory Visit (INDEPENDENT_AMBULATORY_CARE_PROVIDER_SITE_OTHER): Payer: Medicare Other | Admitting: Orthopaedic Surgery

## 2017-01-20 ENCOUNTER — Ambulatory Visit (INDEPENDENT_AMBULATORY_CARE_PROVIDER_SITE_OTHER): Payer: Medicare Other

## 2017-01-20 ENCOUNTER — Encounter (INDEPENDENT_AMBULATORY_CARE_PROVIDER_SITE_OTHER): Payer: Self-pay | Admitting: Orthopaedic Surgery

## 2017-01-20 VITALS — BP 140/69 | HR 73 | Ht 69.0 in | Wt 160.0 lb

## 2017-01-20 DIAGNOSIS — Z96641 Presence of right artificial hip joint: Secondary | ICD-10-CM

## 2017-01-20 DIAGNOSIS — S72001D Fracture of unspecified part of neck of right femur, subsequent encounter for closed fracture with routine healing: Secondary | ICD-10-CM

## 2017-01-20 NOTE — Progress Notes (Signed)
Post-Op Visit Note   Patient: Gabrielle [name]           Date of Birth: 07/18/1927           MRN: [id number] Visit Date: 01/20/2017 PCP: Gweneth Dimitri, MD   Assessment & Plan:  Chief Complaint:  Chief Complaint  Patient presents with  . Right Hip - Routine Post Op, Wound Check   Visit Diagnoses:Right hip prosthetic infection after hip hemiarthroplasty. She had antibiotic beads placed the rubs audible. Incision is no longer draining. We'll see her back in one week for suture removal. She is on vancomycin.  The patient's son will call and make an appointment with Dr. Daiva Eves in infectious disease.  Plan: Return 1 week for wound check and suture removal. Continue vancomycin. I discussed with the patient and also the patient's son that was staph infection and is still possible that she would need to have the prosthesis removed and have an antibiotic spacer placed if she develops recurrent drainage. We discussed the placement of antibiotic spacer over 6-8 weeks and then returning to place a regular prosthesis which usually is successful. Her present wound looks good pain is less she standing more starting to walk more. She can see Dr. Daiva Eves for laboratory follow-up.  Follow-Up Instructions: No Follow-up on file.   Orders:  No orders of the defined types were placed in this encounter.  No orders of the defined types were placed in this encounter.  HPI Patient returns for wound check. She is status post I&D Right Hip with Antibiotic Bead Placement 12/22/2016. She states that she is doing much better. She can actually get up on her own with use of a walker. Her mobility is better. She does not have pain unless she bumps her hip.    Imaging: No results found.  PMFS History: Patient Active Problem List   Diagnosis Date Noted  . Prosthetic hip infection (HCC)   . Staphylococcus aureus infection   . Alzheimer's dementia with behavioral disturbance   . Postoperative wound infection  of right hip 12/22/2016  . Infection and inflammatory reaction due to internal right hip prosthesis, initial encounter (HCC) 12/22/2016  . S/P total hip arthroplasty 12/22/2016  . SOB (shortness of breath)   . Influenza A 12/06/2016  . Closed fracture of right hip (HCC) 12/05/2016  . Dementia 12/05/2016  . Fall 12/05/2016  . Cough 12/05/2016  . S/P hip hemiarthroplasty 09/16/2016  . Mallet finger, left small finger 09/16/2016  . Hip fracture (HCC) 08/30/2016  . Vascular dementia 08/30/2016  . Osteoporosis 08/30/2016  . Vaginal candida 08/30/2016  . UTI (urinary tract infection) 08/30/2016  . Mild aortic insufficiency: Per 2 d echo 08/04/2015 08/04/2015  . Moderate mitral regurgitation: Per 2 d echo 08/04/2015 08/04/2015  . Glaucoma 08/04/2015  . Dizziness 08/04/2015  . Syncope 08/03/2015  . Depression 12/09/2013  . Constipation 11/30/2013  . Pure hypercholesterolemia 11/30/2013  . Acute posthemorrhagic anemia 11/30/2013  . OA (osteoarthritis) of knee 11/21/2013   Past Medical History:  Diagnosis Date  . Arthritis    oa  . Glaucoma 08/04/2015  . Hyperlipidemia   . Mild aortic insufficiency: Per 2 d echo 08/04/2015 08/04/2015  . Moderate mitral regurgitation: Per 2 d echo 08/04/2015 08/04/2015    No family history on file.  Past Surgical History:  Procedure Laterality Date  . ABDOMINAL HYSTERECTOMY    . benign breast tumor removed  age 88  . HIP ARTHROPLASTY Left 08/31/2016   Procedure: ARTHROPLASTY  HIP (HEMIARTHROPLASTY);  Surgeon: Eldred MangesMark C Yates, MD;  [facility];  Service: Orthopedics;  Laterality: Left;  . HIP ARTHROPLASTY Right 12/06/2016   Procedure: ARTHROPLASTY BIPOLAR HIP (HEMIARTHROPLASTY);  Surgeon: Eldred MangesMark C Yates, MD;  Location: [facility];  Service: Orthopedics;  Laterality: Right;  . INCISION AND DRAINAGE HIP Right 12/22/2016   Procedure: IRRIGATION AND DEBRIDEMENT HIP WITH ANTIBIOTIC BEAD APPLICATION;  Surgeon: Valeria BatmanPeter W Whitfield, MD;  Location: [facility] OR;  Service:  Orthopedics;  Laterality: Right;  . TONSILLECTOMY  age 83  . TOTAL KNEE ARTHROPLASTY Right 11/21/2013   Procedure: RIGHT TOTAL KNEE ARTHROPLASTY;  Surgeon: Loanne DrillingFrank V Aluisio, MD;  Location: [facility]S;  Service: Orthopedics;  Laterality: Right;  . WRIST FRACTURE SURGERY Right    Social History   Occupational History  . Not on file.   Social History Main Topics  . Smoking status: Former Smoker    Packs/day: 1.00    Years: 17.00    Types: Cigarettes    Quit date: 11/10/1965  . Smokeless tobacco: Never Used  . Alcohol use Yes     Comment: very occasional wine  . Drug use: No  . Sexual activity: Not on file

## 2017-01-21 LAB — FUNGUS CULTURE RESULT

## 2017-01-21 LAB — FUNGUS CULTURE WITH STAIN

## 2017-01-21 LAB — FUNGAL ORGANISM REFLEX

## 2017-01-21 NOTE — Progress Notes (Signed)
FYI

## 2017-01-22 DIAGNOSIS — D649 Anemia, unspecified: Secondary | ICD-10-CM | POA: Diagnosis not present

## 2017-01-22 DIAGNOSIS — F320 Major depressive disorder, single episode, mild: Secondary | ICD-10-CM | POA: Diagnosis not present

## 2017-01-22 DIAGNOSIS — S72001G Fracture of unspecified part of neck of right femur, subsequent encounter for closed fracture with delayed healing: Secondary | ICD-10-CM | POA: Diagnosis not present

## 2017-01-27 ENCOUNTER — Ambulatory Visit (INDEPENDENT_AMBULATORY_CARE_PROVIDER_SITE_OTHER): Payer: Medicare Other | Admitting: Internal Medicine

## 2017-01-27 VITALS — BP 117/79 | HR 62 | Temp 97.5°F

## 2017-01-27 DIAGNOSIS — T8450XD Infection and inflammatory reaction due to unspecified internal joint prosthesis, subsequent encounter: Secondary | ICD-10-CM | POA: Diagnosis not present

## 2017-01-27 DIAGNOSIS — F0150 Vascular dementia without behavioral disturbance: Secondary | ICD-10-CM | POA: Diagnosis not present

## 2017-01-27 NOTE — Progress Notes (Signed)
RFV: hospital follow up for right hip post op wound infection with  Patient ID: Gabrielle [name], female   DOB: 1927-05-30, 83 y.o.   MRN: [id number]  HPI  Britta MccreedyBarbara is a 83yo F with dementia, who has a right hip hemiarthroplasty on 12/06/2016 perfomred by Dr Ophelia Charteryates, and transferred to a SNF for rehab. She was seen in follow up in Dr Ophelia CharterYates clinic where it was noted that she had increasing drainage to her right hip. She was admitted mid Feb for drainage from right hip wound. She underwent I x d of right hip with wound vac placement. Or cx grew MSSA. She was discharged on 6 wk of iv cefazolin. Currently on week 5 day 2 of abtx course. She is a resident of [facility]  Lab Results  Component Value Date   ESRSEDRATE 6971 (H) 12/22/2016     Outpatient Encounter Prescriptions as of 01/27/2017  Medication Sig  . albuterol (PROVENTIL) (2.5 MG/3ML) 0.083% nebulizer solution Take 3 mLs (2.5 mg total) by nebulization every 6 (six) hours as needed for wheezing or shortness of breath. (Patient taking differently: Take 2.5 mg by nebulization See admin instructions. Use 1 vial via nebulizer 3 times daily. Use 1 vial via nebulizer every 6 hours as needed for shortness of breath.)  . aspirin EC 325 MG EC tablet Take 1 tablet (325 mg total) by mouth daily with breakfast.  . atorvastatin (LIPITOR) 20 MG tablet Take 20 mg by mouth at bedtime.   . bisacodyl (DULCOLAX) 5 MG EC tablet Take 1 tablet (5 mg total) by mouth daily as needed for moderate constipation.  . Calcium Carb-Cholecalciferol (CALCIUM 600 + D PO) Take 1 tablet by mouth 2 (two) times daily.  Marland Kitchen. ceFAZolin (ANCEF) 2-4 GM/100ML-% IVPB Inject 100 mLs (2 g total) into the vein every 8 (eight) hours.  . Cholecalciferol (VITAMIN D) 2000 UNITS CAPS Take 4,000 Units by mouth at bedtime.   . citalopram (CELEXA) 20 MG tablet Take 20 mg by mouth at bedtime.   Marland Kitchen. dextromethorphan-guaiFENesin (MUCINEX DM) 30-600 MG 12hr tablet Take 1 tablet by mouth  2 (two) times daily.  Marland Kitchen. donepezil (ARICEPT) 10 MG tablet Take 10 mg by mouth at bedtime.   . feeding supplement, ENSURE ENLIVE, (ENSURE ENLIVE) LIQD Take 237 mLs by mouth 2 (two) times daily between meals.  Marland Kitchen. guaiFENesin (MUCINEX) 600 MG 12 hr tablet Take 600 mg by mouth 2 (two) times daily.  Marland Kitchen. HYDROcodone-acetaminophen (NORCO) 5-325 MG tablet Take 1 tablet by mouth every 6 (six) hours as needed for moderate pain.  . memantine (NAMENDA) 5 MG tablet Take 5 mg by mouth at bedtime.  . Multiple Vitamins-Minerals (ICAPS MV) TABS Take 1 tablet by mouth at bedtime.   . mupirocin ointment (BACTROBAN) 2 % Place 1 application into the nose 2 (two) times daily.  . Omega-3 Fatty Acids (FISH OIL) 1000 MG CAPS Take 1,000 mg by mouth at bedtime.   . predniSONE (DELTASONE) 20 MG tablet Take 1 tablet (20 mg total) by mouth daily with breakfast.  . rifampin (RIFADIN) 300 MG capsule Take 1 capsule (300 mg total) by mouth every 12 (twelve) hours.  . senna (SENOKOT) 8.6 MG tablet Take 1 tablet by mouth at bedtime as needed for constipation.  . timolol (BETIMOL) 0.25 % ophthalmic solution Place 1 drop into both eyes 2 (two) times daily.   . timolol (TIMOPTIC) 0.25 % ophthalmic solution Place 1 drop into both eyes 2 (two) times daily.   No  facility-administered encounter medications on file as of 01/27/2017.      Patient Active Problem List   Diagnosis Date Noted  . Prosthetic hip infection (HCC)   . Staphylococcus aureus infection   . Alzheimer's dementia with behavioral disturbance   . Postoperative wound infection of right hip 12/22/2016  . Infection and inflammatory reaction due to internal right hip prosthesis, initial encounter (HCC) 12/22/2016  . S/P total hip arthroplasty 12/22/2016  . SOB (shortness of breath)   . Influenza A 12/06/2016  . Closed fracture of right hip (HCC) 12/05/2016  . Dementia 12/05/2016  . Fall 12/05/2016  . Cough 12/05/2016  . S/P hip hemiarthroplasty 09/16/2016  . Mallet  finger, left small finger 09/16/2016  . Hip fracture (HCC) 08/30/2016  . Vascular dementia 08/30/2016  . Osteoporosis 08/30/2016  . Vaginal candida 08/30/2016  . UTI (urinary tract infection) 08/30/2016  . Mild aortic insufficiency: Per 2 d echo 08/04/2015 08/04/2015  . Moderate mitral regurgitation: Per 2 d echo 08/04/2015 08/04/2015  . Glaucoma 08/04/2015  . Dizziness 08/04/2015  . Syncope 08/03/2015  . Depression 12/09/2013  . Constipation 11/30/2013  . Pure hypercholesterolemia 11/30/2013  . Acute posthemorrhagic anemia 11/30/2013  . OA (osteoarthritis) of knee 11/21/2013     Health Maintenance Due  Topic Date Due  . TETANUS/TDAP  01/03/1948  . PNA vac Low Risk Adult (1 of 2 - PCV13) 01/02/1994  . INFLUENZA VACCINE  06/10/2016     Review of Systems Review of Systems  Constitutional: Negative for fever, chills, diaphoresis, activity change, appetite change, fatigue and unexpected weight change.  HENT: Negative for congestion, sore throat, rhinorrhea, sneezing, trouble swallowing and sinus pressure.  Eyes: Negative for photophobia and visual disturbance.  Respiratory: Negative for cough, chest tightness, shortness of breath, wheezing and stridor.  Cardiovascular: Negative for chest pain, palpitations and leg swelling.  Gastrointestinal: Negative for nausea, vomiting, abdominal pain, diarrhea, constipation, blood in stool, abdominal distention and anal bleeding.  Genitourinary: Negative for dysuria, hematuria, flank pain and difficulty urinating.  Musculoskeletal: Negative for myalgias, back pain, joint swelling, arthralgias and gait problem.  Skin: Negative for color change, pallor, rash and wound.  Neurological: Negative for dizziness, tremors, weakness and light-headedness.  Hematological: Negative for adenopathy. Does not bruise/bleed easily.  Psychiatric/Behavioral: Negative for behavioral problems, confusion, sleep disturbance, dysphoric mood, decreased concentration and  agitation.    Physical Exam  BP 117/79   Pulse 62   Temp 97.5 F (36.4 C) (Oral)   SpO2 97%  Physical Exam  Constitutional:  oriented to person, place, and time. appears well-developed and well-nourished. No distress.  HENT: [place]/AT, PERRLA, no scleral icterus Mouth/Throat: Oropharynx is clear and moist. No oropharyngeal exudate.  Cardiovascular: Normal rate, regular rhythm and normal heart sounds. Exam reveals no gallop and no friction rub.  No murmur heard.  Pulmonary/Chest: Effort normal and breath sounds normal. No respiratory distress.  has no wheezes.  Neck = supple, no nuchal rigidity Abdominal: Soft. Bowel sounds are normal.  exhibits no distension. There is no tenderness.  Lymphadenopathy: no cervical adenopathy. No axillary adenopathy Neurological: alert and oriented to person, place, and time.  Skin: Skin is warm and dry. Hip incision sutures in place but no surrounding erythema. No drainage Psychiatric: a normal mood and affect.  behavior is normal.   CBC Lab Results  Component Value Date   WBC 8.9 12/25/2016   RBC 3.34 (L) 12/25/2016   HGB 9.5 (L) 12/25/2016   HCT 31.1 (L) 12/25/2016   PLT 319 12/25/2016  MCV 93.1 12/25/2016   MCH 28.4 12/25/2016   MCHC 30.5 12/25/2016   RDW 15.2 12/25/2016   LYMPHSABS 0.8 12/22/2016   MONOABS 1.1 (H) 12/22/2016   EOSABS 0.0 12/22/2016    BMET Lab Results  Component Value Date   NA 137 12/25/2016   K 3.9 12/25/2016   CL 100 (L) 12/25/2016   CO2 31 12/25/2016   GLUCOSE 81 12/25/2016   BUN 13 12/25/2016   CREATININE 0.56 12/25/2016   CALCIUM 9.0 12/25/2016   GFRNONAA >60 12/25/2016   GFRAA >60 12/25/2016    Lab Results  Component Value Date   ESRSEDRATE 14 01/27/2017   Lab Results  Component Value Date   CRP 2.4 01/27/2017     Assessment and Plan MSSA prosthetic joint infection: - will check sed rate and crp - appear that inflammatory markers have returned to baseline, which is great. After she stops the  IV abtx next week, we will have her take keflex 500mg  PO TID plus rifampin 300mg  PO BID x 6 months - asked to stop atorvastatin (due to potential drug interaction with rifampin) - asked to taper prednisone if not needed  - rtc in 6wk

## 2017-01-28 ENCOUNTER — Encounter (INDEPENDENT_AMBULATORY_CARE_PROVIDER_SITE_OTHER): Payer: Self-pay | Admitting: Orthopaedic Surgery

## 2017-01-28 ENCOUNTER — Ambulatory Visit (INDEPENDENT_AMBULATORY_CARE_PROVIDER_SITE_OTHER): Payer: Medicare Other | Admitting: Orthopaedic Surgery

## 2017-01-28 VITALS — BP 130/57 | HR 65 | Ht 69.0 in | Wt 160.0 lb

## 2017-01-28 DIAGNOSIS — T8459XD Infection and inflammatory reaction due to other internal joint prosthesis, subsequent encounter: Secondary | ICD-10-CM

## 2017-01-28 DIAGNOSIS — Z96649 Presence of unspecified artificial hip joint: Secondary | ICD-10-CM

## 2017-01-28 LAB — SEDIMENTATION RATE: Sed Rate: 14 mm/hr (ref 0–30)

## 2017-01-28 LAB — C-REACTIVE PROTEIN: CRP: 2.4 mg/L (ref <8.0)

## 2017-01-28 NOTE — Progress Notes (Signed)
Post-Op Visit Note   Patient: Gabrielle [name]           Date of Birth: August 08, 1927           MRN: [id number] Visit Date: 01/28/2017 PCP: Gweneth Dimitri, MD   Assessment & Plan: Right hip MSS a postop infection. She was washed out by Dr. Cleophas Dunker she is on Ancef and rifampin. Sedimentation rate is 14 and previously it was 71. We're still waiting on the CRP that was ordered by Dr. Drue Second. She states the hip is feeling better she's eating better walking better. Incision is clean and dry no drainage on the dressing. No fluctuance and sutures are harvested today. Recheck 3 weeks continued antibiotics. I discussed with patient and her family that they would call me promptly if she develops recurrent drainage erythema fever or significant increase in hip pain. X-ray show no evidence of bone infection and her lab work so far looks good. Hopefully no further surgery will be necessary. She has an appointment with Dr. Drue Second and 6 weeks. Chief Complaint:  Chief Complaint  Patient presents with  . Right Hip - Routine Post Op   Visit Diagnoses: No diagnosis found.  Plan: Sutures removed. Return office visit 3 weeks.  Follow-Up Instructions: No Follow-up on file.   Orders:  No orders of the defined types were placed in this encounter.  No orders of the defined types were placed in this encounter.  HPI Patient returns for one week wound check and possible suture removal. She states that she is doing well.    Imaging: No results found.  PMFS History: Patient Active Problem List   Diagnosis Date Noted  . Prosthetic hip infection (HCC)   . Staphylococcus aureus infection   . Alzheimer's dementia with behavioral disturbance   . Postoperative wound infection of right hip 12/22/2016  . Infection and inflammatory reaction due to internal right hip prosthesis, initial encounter (HCC) 12/22/2016  . S/P total hip arthroplasty 12/22/2016  . SOB (shortness of breath)   . Influenza A  12/06/2016  . Closed fracture of right hip (HCC) 12/05/2016  . Dementia 12/05/2016  . Fall 12/05/2016  . Cough 12/05/2016  . S/P hip hemiarthroplasty 09/16/2016  . Mallet finger, left small finger 09/16/2016  . Hip fracture (HCC) 08/30/2016  . Vascular dementia 08/30/2016  . Osteoporosis 08/30/2016  . Vaginal candida 08/30/2016  . UTI (urinary tract infection) 08/30/2016  . Mild aortic insufficiency: Per 2 d echo 08/04/2015 08/04/2015  . Moderate mitral regurgitation: Per 2 d echo 08/04/2015 08/04/2015  . Glaucoma 08/04/2015  . Dizziness 08/04/2015  . Syncope 08/03/2015  . Depression 12/09/2013  . Constipation 11/30/2013  . Pure hypercholesterolemia 11/30/2013  . Acute posthemorrhagic anemia 11/30/2013  . OA (osteoarthritis) of knee 11/21/2013   Past Medical History:  Diagnosis Date  . Arthritis    oa  . Glaucoma 08/04/2015  . Hyperlipidemia   . Mild aortic insufficiency: Per 2 d echo 08/04/2015 08/04/2015  . Moderate mitral regurgitation: Per 2 d echo 08/04/2015 08/04/2015    No family history on file.  Past Surgical History:  Procedure Laterality Date  . ABDOMINAL HYSTERECTOMY    . benign breast tumor removed  age 57  . HIP ARTHROPLASTY Left 08/31/2016   Procedure: ARTHROPLASTY  HIP (HEMIARTHROPLASTY);  Surgeon: Eldred Manges, MD;  Location: [facility] OR;  Service: Orthopedics;  Laterality: Left;  . HIP ARTHROPLASTY Right 12/06/2016   Procedure: ARTHROPLASTY BIPOLAR HIP (HEMIARTHROPLASTY);  Surgeon: Eldred Manges, MD;  Location: [facility];  Service: Orthopedics;  Laterality: Right;  . INCISION AND DRAINAGE HIP Right 12/22/2016   Procedure: IRRIGATION AND DEBRIDEMENT HIP WITH ANTIBIOTIC BEAD APPLICATION;  Surgeon: Valeria BatmanPeter W Whitfield, MD;  Location: [facility] OR;  Service: Orthopedics;  Laterality: Right;  . TONSILLECTOMY  age 83  . TOTAL KNEE ARTHROPLASTY Right 11/21/2013   Procedure: RIGHT TOTAL KNEE ARTHROPLASTY;  Surgeon: Loanne DrillingFrank V Aluisio, MD;  Location: [facility] ORS;  Service: Orthopedics;  Laterality:  Right;  . WRIST FRACTURE SURGERY Right    Social History   Occupational History  . Not on file.   Social History Main Topics  . Smoking status: Former Smoker    Packs/day: 1.00    Years: 17.00    Types: Cigarettes    Quit date: 11/10/1965  . Smokeless tobacco: Never Used  . Alcohol use Yes     Comment: very occasional wine  . Drug use: No  . Sexual activity: Not on file

## 2017-01-30 ENCOUNTER — Telehealth: Payer: Self-pay | Admitting: *Deleted

## 2017-01-30 NOTE — Telephone Encounter (Signed)
Gabrielle [name] called back and confirmed stop date for IV antibiotics 3/27, will pull PICC at end of dose.  Will start Keflex 500mg  TID plus rifampin 300mg  BID for 6 months.  She will collect CMP biweekly and will fax results to [phone].

## 2017-01-30 NOTE — Telephone Encounter (Signed)
-----   Message from Judyann Munsonynthia Snider, MD sent at 01/30/2017  3:37 PM EDT ----- Can you call [facility] and say that after she finishes her IV abtx next week that they can pull her picc line.  After Iv abtx stopped,They will need to start her on keflex 500mg  TID plus rifampin 300mg  PO BID x 6 months.  She will need every other week CMP  She is to return to see us in 6 wk

## 2017-01-30 NOTE — Telephone Encounter (Signed)
Left message for nurse on call to call the clinic back for verbal orders.  Will continue to follow. Andree CossHowell, Roth Ress M, RN

## 2017-02-04 ENCOUNTER — Telehealth: Payer: Self-pay | Admitting: *Deleted

## 2017-02-04 LAB — ACID FAST CULTURE WITH REFLEXED SENSITIVITIES

## 2017-02-04 LAB — ACID FAST CULTURE WITH REFLEXED SENSITIVITIES (MYCOBACTERIA): Acid Fast Culture: NEGATIVE

## 2017-02-04 NOTE — Telephone Encounter (Signed)
[facility] called to confirm stop date of IV antibiotic. This was previously called in . Stop date 02/03/17 and ok to pull picc line per Dr. Drue SecondSnider. Wendall MolaJacqueline Cockerham

## 2017-02-05 DIAGNOSIS — F341 Dysthymic disorder: Secondary | ICD-10-CM | POA: Diagnosis not present

## 2017-02-05 DIAGNOSIS — Z8709 Personal history of other diseases of the respiratory system: Secondary | ICD-10-CM | POA: Diagnosis not present

## 2017-02-05 DIAGNOSIS — T8459XD Infection and inflammatory reaction due to other internal joint prosthesis, subsequent encounter: Secondary | ICD-10-CM | POA: Diagnosis not present

## 2017-02-05 DIAGNOSIS — Z0000 Encounter for general adult medical examination without abnormal findings: Secondary | ICD-10-CM | POA: Diagnosis not present

## 2017-02-05 DIAGNOSIS — F0151 Vascular dementia with behavioral disturbance: Secondary | ICD-10-CM | POA: Diagnosis not present

## 2017-02-05 DIAGNOSIS — E782 Mixed hyperlipidemia: Secondary | ICD-10-CM | POA: Diagnosis not present

## 2017-02-05 DIAGNOSIS — E559 Vitamin D deficiency, unspecified: Secondary | ICD-10-CM | POA: Diagnosis not present

## 2017-02-05 DIAGNOSIS — R7301 Impaired fasting glucose: Secondary | ICD-10-CM | POA: Diagnosis not present

## 2017-02-05 DIAGNOSIS — M810 Age-related osteoporosis without current pathological fracture: Secondary | ICD-10-CM | POA: Diagnosis not present

## 2017-02-05 DIAGNOSIS — Z96649 Presence of unspecified artificial hip joint: Secondary | ICD-10-CM | POA: Diagnosis not present

## 2017-02-06 LAB — ACID FAST CULTURE WITH REFLEXED SENSITIVITIES: ACID FAST CULTURE - AFSCU3: NEGATIVE

## 2017-02-06 LAB — ACID FAST CULTURE WITH REFLEXED SENSITIVITIES (MYCOBACTERIA): Acid Fast Culture: NEGATIVE

## 2017-02-11 DIAGNOSIS — H409 Unspecified glaucoma: Secondary | ICD-10-CM | POA: Diagnosis not present

## 2017-02-11 DIAGNOSIS — F0151 Vascular dementia with behavioral disturbance: Secondary | ICD-10-CM | POA: Diagnosis not present

## 2017-02-11 DIAGNOSIS — R269 Unspecified abnormalities of gait and mobility: Secondary | ICD-10-CM | POA: Diagnosis not present

## 2017-02-11 DIAGNOSIS — K5900 Constipation, unspecified: Secondary | ICD-10-CM | POA: Diagnosis not present

## 2017-02-11 DIAGNOSIS — E785 Hyperlipidemia, unspecified: Secondary | ICD-10-CM | POA: Diagnosis not present

## 2017-02-11 DIAGNOSIS — R7301 Impaired fasting glucose: Secondary | ICD-10-CM | POA: Diagnosis not present

## 2017-02-11 DIAGNOSIS — H3530 Unspecified macular degeneration: Secondary | ICD-10-CM | POA: Diagnosis not present

## 2017-02-11 DIAGNOSIS — M810 Age-related osteoporosis without current pathological fracture: Secondary | ICD-10-CM | POA: Diagnosis not present

## 2017-02-13 DIAGNOSIS — R52 Pain, unspecified: Secondary | ICD-10-CM | POA: Diagnosis not present

## 2017-02-13 DIAGNOSIS — H409 Unspecified glaucoma: Secondary | ICD-10-CM | POA: Diagnosis not present

## 2017-02-13 DIAGNOSIS — F0150 Vascular dementia without behavioral disturbance: Secondary | ICD-10-CM | POA: Diagnosis not present

## 2017-02-13 DIAGNOSIS — M6281 Muscle weakness (generalized): Secondary | ICD-10-CM | POA: Diagnosis not present

## 2017-02-13 DIAGNOSIS — F329 Major depressive disorder, single episode, unspecified: Secondary | ICD-10-CM | POA: Diagnosis not present

## 2017-02-13 DIAGNOSIS — M810 Age-related osteoporosis without current pathological fracture: Secondary | ICD-10-CM | POA: Diagnosis not present

## 2017-02-14 DIAGNOSIS — M810 Age-related osteoporosis without current pathological fracture: Secondary | ICD-10-CM | POA: Diagnosis not present

## 2017-02-14 DIAGNOSIS — M6281 Muscle weakness (generalized): Secondary | ICD-10-CM | POA: Diagnosis not present

## 2017-02-14 DIAGNOSIS — H409 Unspecified glaucoma: Secondary | ICD-10-CM | POA: Diagnosis not present

## 2017-02-14 DIAGNOSIS — F0150 Vascular dementia without behavioral disturbance: Secondary | ICD-10-CM | POA: Diagnosis not present

## 2017-02-14 DIAGNOSIS — F329 Major depressive disorder, single episode, unspecified: Secondary | ICD-10-CM | POA: Diagnosis not present

## 2017-02-14 DIAGNOSIS — R52 Pain, unspecified: Secondary | ICD-10-CM | POA: Diagnosis not present

## 2017-02-18 DIAGNOSIS — F0150 Vascular dementia without behavioral disturbance: Secondary | ICD-10-CM | POA: Diagnosis not present

## 2017-02-18 DIAGNOSIS — H409 Unspecified glaucoma: Secondary | ICD-10-CM | POA: Diagnosis not present

## 2017-02-18 DIAGNOSIS — R269 Unspecified abnormalities of gait and mobility: Secondary | ICD-10-CM | POA: Diagnosis not present

## 2017-02-18 DIAGNOSIS — J449 Chronic obstructive pulmonary disease, unspecified: Secondary | ICD-10-CM | POA: Diagnosis not present

## 2017-02-18 DIAGNOSIS — G4700 Insomnia, unspecified: Secondary | ICD-10-CM | POA: Diagnosis not present

## 2017-02-18 DIAGNOSIS — R52 Pain, unspecified: Secondary | ICD-10-CM | POA: Diagnosis not present

## 2017-02-18 DIAGNOSIS — M6281 Muscle weakness (generalized): Secondary | ICD-10-CM | POA: Diagnosis not present

## 2017-02-18 DIAGNOSIS — T8452XS Infection and inflammatory reaction due to internal left hip prosthesis, sequela: Secondary | ICD-10-CM | POA: Diagnosis not present

## 2017-02-18 DIAGNOSIS — G309 Alzheimer's disease, unspecified: Secondary | ICD-10-CM | POA: Diagnosis not present

## 2017-02-18 DIAGNOSIS — M810 Age-related osteoporosis without current pathological fracture: Secondary | ICD-10-CM | POA: Diagnosis not present

## 2017-02-18 DIAGNOSIS — F329 Major depressive disorder, single episode, unspecified: Secondary | ICD-10-CM | POA: Diagnosis not present

## 2017-02-19 DIAGNOSIS — E785 Hyperlipidemia, unspecified: Secondary | ICD-10-CM | POA: Diagnosis not present

## 2017-02-19 DIAGNOSIS — Z79899 Other long term (current) drug therapy: Secondary | ICD-10-CM | POA: Diagnosis not present

## 2017-02-20 DIAGNOSIS — M810 Age-related osteoporosis without current pathological fracture: Secondary | ICD-10-CM | POA: Diagnosis not present

## 2017-02-20 DIAGNOSIS — F0150 Vascular dementia without behavioral disturbance: Secondary | ICD-10-CM | POA: Diagnosis not present

## 2017-02-20 DIAGNOSIS — F329 Major depressive disorder, single episode, unspecified: Secondary | ICD-10-CM | POA: Diagnosis not present

## 2017-02-20 DIAGNOSIS — M6281 Muscle weakness (generalized): Secondary | ICD-10-CM | POA: Diagnosis not present

## 2017-02-20 DIAGNOSIS — R52 Pain, unspecified: Secondary | ICD-10-CM | POA: Diagnosis not present

## 2017-02-20 DIAGNOSIS — H409 Unspecified glaucoma: Secondary | ICD-10-CM | POA: Diagnosis not present

## 2017-02-24 ENCOUNTER — Ambulatory Visit (INDEPENDENT_AMBULATORY_CARE_PROVIDER_SITE_OTHER): Payer: Medicare Other | Admitting: Orthopaedic Surgery

## 2017-02-24 DIAGNOSIS — H409 Unspecified glaucoma: Secondary | ICD-10-CM | POA: Diagnosis not present

## 2017-02-24 DIAGNOSIS — M6281 Muscle weakness (generalized): Secondary | ICD-10-CM | POA: Diagnosis not present

## 2017-02-24 DIAGNOSIS — R52 Pain, unspecified: Secondary | ICD-10-CM | POA: Diagnosis not present

## 2017-02-24 DIAGNOSIS — F0150 Vascular dementia without behavioral disturbance: Secondary | ICD-10-CM | POA: Diagnosis not present

## 2017-02-24 DIAGNOSIS — F329 Major depressive disorder, single episode, unspecified: Secondary | ICD-10-CM | POA: Diagnosis not present

## 2017-02-24 DIAGNOSIS — M810 Age-related osteoporosis without current pathological fracture: Secondary | ICD-10-CM | POA: Diagnosis not present

## 2017-02-26 DIAGNOSIS — M810 Age-related osteoporosis without current pathological fracture: Secondary | ICD-10-CM | POA: Diagnosis not present

## 2017-02-26 DIAGNOSIS — H409 Unspecified glaucoma: Secondary | ICD-10-CM | POA: Diagnosis not present

## 2017-02-26 DIAGNOSIS — R52 Pain, unspecified: Secondary | ICD-10-CM | POA: Diagnosis not present

## 2017-02-26 DIAGNOSIS — F0150 Vascular dementia without behavioral disturbance: Secondary | ICD-10-CM | POA: Diagnosis not present

## 2017-02-26 DIAGNOSIS — F329 Major depressive disorder, single episode, unspecified: Secondary | ICD-10-CM | POA: Diagnosis not present

## 2017-02-26 DIAGNOSIS — M6281 Muscle weakness (generalized): Secondary | ICD-10-CM | POA: Diagnosis not present

## 2017-03-02 DIAGNOSIS — F329 Major depressive disorder, single episode, unspecified: Secondary | ICD-10-CM | POA: Diagnosis not present

## 2017-03-02 DIAGNOSIS — M6281 Muscle weakness (generalized): Secondary | ICD-10-CM | POA: Diagnosis not present

## 2017-03-02 DIAGNOSIS — F0150 Vascular dementia without behavioral disturbance: Secondary | ICD-10-CM | POA: Diagnosis not present

## 2017-03-02 DIAGNOSIS — H409 Unspecified glaucoma: Secondary | ICD-10-CM | POA: Diagnosis not present

## 2017-03-02 DIAGNOSIS — R52 Pain, unspecified: Secondary | ICD-10-CM | POA: Diagnosis not present

## 2017-03-02 DIAGNOSIS — M810 Age-related osteoporosis without current pathological fracture: Secondary | ICD-10-CM | POA: Diagnosis not present

## 2017-03-04 DIAGNOSIS — M6281 Muscle weakness (generalized): Secondary | ICD-10-CM | POA: Diagnosis not present

## 2017-03-04 DIAGNOSIS — F329 Major depressive disorder, single episode, unspecified: Secondary | ICD-10-CM | POA: Diagnosis not present

## 2017-03-04 DIAGNOSIS — M810 Age-related osteoporosis without current pathological fracture: Secondary | ICD-10-CM | POA: Diagnosis not present

## 2017-03-04 DIAGNOSIS — R52 Pain, unspecified: Secondary | ICD-10-CM | POA: Diagnosis not present

## 2017-03-04 DIAGNOSIS — H409 Unspecified glaucoma: Secondary | ICD-10-CM | POA: Diagnosis not present

## 2017-03-04 DIAGNOSIS — F0150 Vascular dementia without behavioral disturbance: Secondary | ICD-10-CM | POA: Diagnosis not present

## 2017-03-05 DIAGNOSIS — F0150 Vascular dementia without behavioral disturbance: Secondary | ICD-10-CM | POA: Diagnosis not present

## 2017-03-09 DIAGNOSIS — F0150 Vascular dementia without behavioral disturbance: Secondary | ICD-10-CM | POA: Diagnosis not present

## 2017-03-09 DIAGNOSIS — F329 Major depressive disorder, single episode, unspecified: Secondary | ICD-10-CM | POA: Diagnosis not present

## 2017-03-09 DIAGNOSIS — M6281 Muscle weakness (generalized): Secondary | ICD-10-CM | POA: Diagnosis not present

## 2017-03-09 DIAGNOSIS — R52 Pain, unspecified: Secondary | ICD-10-CM | POA: Diagnosis not present

## 2017-03-09 DIAGNOSIS — M810 Age-related osteoporosis without current pathological fracture: Secondary | ICD-10-CM | POA: Diagnosis not present

## 2017-03-09 DIAGNOSIS — H409 Unspecified glaucoma: Secondary | ICD-10-CM | POA: Diagnosis not present

## 2017-03-10 ENCOUNTER — Ambulatory Visit: Payer: Medicare Other | Admitting: Internal Medicine

## 2017-03-11 DIAGNOSIS — H409 Unspecified glaucoma: Secondary | ICD-10-CM | POA: Diagnosis not present

## 2017-03-11 DIAGNOSIS — F0150 Vascular dementia without behavioral disturbance: Secondary | ICD-10-CM | POA: Diagnosis not present

## 2017-03-11 DIAGNOSIS — M6281 Muscle weakness (generalized): Secondary | ICD-10-CM | POA: Diagnosis not present

## 2017-03-11 DIAGNOSIS — M810 Age-related osteoporosis without current pathological fracture: Secondary | ICD-10-CM | POA: Diagnosis not present

## 2017-03-11 DIAGNOSIS — R52 Pain, unspecified: Secondary | ICD-10-CM | POA: Diagnosis not present

## 2017-03-11 DIAGNOSIS — F329 Major depressive disorder, single episode, unspecified: Secondary | ICD-10-CM | POA: Diagnosis not present

## 2017-03-12 ENCOUNTER — Telehealth (INDEPENDENT_AMBULATORY_CARE_PROVIDER_SITE_OTHER): Payer: Self-pay | Admitting: Orthopaedic Surgery

## 2017-03-12 NOTE — Telephone Encounter (Signed)
Please advise 

## 2017-03-12 NOTE — Telephone Encounter (Signed)
I CALLED SHE IS ok

## 2017-03-12 NOTE — Telephone Encounter (Signed)
PT DAUGHTER IN LAW SHELLY, (WIFE OF PT  POWER OF ATTORNEY) ASKED IF PT STILL NEEDS TO BE SEEN BY DR. Ophelia CharterYATES. SHE SAID PT IS IN [place] NOW AND WANTS TO KNOW IF SHE NEEDS TO BE SEEN ANYMORE.  SHELLY [phone]

## 2017-03-16 ENCOUNTER — Telehealth (INDEPENDENT_AMBULATORY_CARE_PROVIDER_SITE_OTHER): Payer: Self-pay | Admitting: Orthopaedic Surgery

## 2017-03-16 DIAGNOSIS — H409 Unspecified glaucoma: Secondary | ICD-10-CM | POA: Diagnosis not present

## 2017-03-16 DIAGNOSIS — M810 Age-related osteoporosis without current pathological fracture: Secondary | ICD-10-CM | POA: Diagnosis not present

## 2017-03-16 DIAGNOSIS — F329 Major depressive disorder, single episode, unspecified: Secondary | ICD-10-CM | POA: Diagnosis not present

## 2017-03-16 DIAGNOSIS — F0150 Vascular dementia without behavioral disturbance: Secondary | ICD-10-CM | POA: Diagnosis not present

## 2017-03-16 DIAGNOSIS — M6281 Muscle weakness (generalized): Secondary | ICD-10-CM | POA: Diagnosis not present

## 2017-03-16 DIAGNOSIS — R52 Pain, unspecified: Secondary | ICD-10-CM | POA: Diagnosis not present

## 2017-03-16 NOTE — Telephone Encounter (Signed)
Patient's son Dorene SorrowJerry called advised his mother is now living in [place] and is experiencing pain in her groin area. He was advised to bring her back to see Dr Ophelia CharterYates. He asked for a call back for advice as what to do at this point. The number to contact him is [phone]

## 2017-03-17 NOTE — Telephone Encounter (Signed)
I called and spoke with son. Patient is experiencing more groin pain and physical therapist advised she should come back and be seen. He will bring her in for ROV on 5/15 at 11am.  Can you please put patient onto schedule? Thanks.

## 2017-03-17 NOTE — Telephone Encounter (Signed)
appt 03/24/17 @ 11:00am

## 2017-03-18 DIAGNOSIS — R52 Pain, unspecified: Secondary | ICD-10-CM | POA: Diagnosis not present

## 2017-03-18 DIAGNOSIS — M810 Age-related osteoporosis without current pathological fracture: Secondary | ICD-10-CM | POA: Diagnosis not present

## 2017-03-18 DIAGNOSIS — M6281 Muscle weakness (generalized): Secondary | ICD-10-CM | POA: Diagnosis not present

## 2017-03-18 DIAGNOSIS — H409 Unspecified glaucoma: Secondary | ICD-10-CM | POA: Diagnosis not present

## 2017-03-18 DIAGNOSIS — F329 Major depressive disorder, single episode, unspecified: Secondary | ICD-10-CM | POA: Diagnosis not present

## 2017-03-18 DIAGNOSIS — F0150 Vascular dementia without behavioral disturbance: Secondary | ICD-10-CM | POA: Diagnosis not present

## 2017-03-24 ENCOUNTER — Ambulatory Visit (INDEPENDENT_AMBULATORY_CARE_PROVIDER_SITE_OTHER): Payer: Medicare Other

## 2017-03-24 ENCOUNTER — Encounter (INDEPENDENT_AMBULATORY_CARE_PROVIDER_SITE_OTHER): Payer: Self-pay | Admitting: Orthopaedic Surgery

## 2017-03-24 ENCOUNTER — Ambulatory Visit (INDEPENDENT_AMBULATORY_CARE_PROVIDER_SITE_OTHER): Payer: Medicare Other | Admitting: Orthopaedic Surgery

## 2017-03-24 VITALS — BP 147/71 | HR 61

## 2017-03-24 DIAGNOSIS — M25551 Pain in right hip: Secondary | ICD-10-CM

## 2017-03-26 DIAGNOSIS — M6281 Muscle weakness (generalized): Secondary | ICD-10-CM | POA: Diagnosis not present

## 2017-03-26 DIAGNOSIS — M810 Age-related osteoporosis without current pathological fracture: Secondary | ICD-10-CM | POA: Diagnosis not present

## 2017-03-26 DIAGNOSIS — F0150 Vascular dementia without behavioral disturbance: Secondary | ICD-10-CM | POA: Diagnosis not present

## 2017-03-26 DIAGNOSIS — R52 Pain, unspecified: Secondary | ICD-10-CM | POA: Diagnosis not present

## 2017-03-26 DIAGNOSIS — F329 Major depressive disorder, single episode, unspecified: Secondary | ICD-10-CM | POA: Diagnosis not present

## 2017-03-26 DIAGNOSIS — H409 Unspecified glaucoma: Secondary | ICD-10-CM | POA: Diagnosis not present

## 2017-03-26 NOTE — Progress Notes (Signed)
Office Visit Note   Patient: Gabrielle [name]           Date of Birth: 09-02-1927           MRN: [id number] Visit Date: 03/24/2017              Requested by: [name], [name], MD [address] [place], [place] [address] PCP: [name], [name], MD   Assessment & Plan: Visit Diagnoses:  1. Pain in right hip     Plan: X-rays incision looked good. 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 She'll call if she has any change in her incision or runs a fever or her pain progresses. X-rays reviewed with patient and her son.  Follow-Up Instructions: Return if symptoms worsen or fail to improve.   Orders:  Orders Placed This Encounter  Procedures  . XR HIP UNILAT W OR W/O PELVIS 2-3 VIEWS RIGHT   No orders of the defined types were placed in this encounter.     Procedures: No procedures performed   Clinical Data: No additional findings.   Subjective: Chief Complaint  Patient presents with  . Right Hip - Pain    HPI 83-year-old female seen with groin pain. She is here with her son and had right hip hemiarthroplasty for fracture on 12/06/2016. Postoperatively 20 days later she had some increased pain and erythema had I and D of her hip done with antibiotic bead placement. She had appropriate IV antibiotics and was followed by the infectious disease service. Recent sedimentation rate CRP in March was normal. She is diameter with a walker. Her pain is mild she points to the groin or 7 discomfort she denies associated back pain. She's had no erythema and no drainage no fever no chills. She states if she walks like she gets some aching in the groin.  Review of Systems 14 point review of systems is updated from her surgery 3 months ago and is unchanged. No fever no chills no drainage from the incision. Otherwise negative other than history of present illness.   Objective: Vital Signs: BP (!) 147/71   Pulse 61   Physical Exam  Constitutional: She is oriented to person, place,  and time. She appears well-developed.  HENT:  Head: Normocephalic.  Right Ear: External ear normal.  Left Ear: External ear normal.  Eyes: Pupils are equal, round, and reactive to light.  Neck: No tracheal deviation present. No thyromegaly present.  Cardiovascular: Normal rate.   Pulmonary/Chest: Effort normal.  Abdominal: Soft.  Musculoskeletal:  Patient's immature with her walker. She has some mild weakness with hip flexion. Quad strength is good. No tenderness of the trochanter. Hip incision is well-healed there is no erythema complete healing of the incision. No tenderness with palpation. No pain with internal/external rotation of the hip. Calfs are soft distal pulses are palpable. Knee range of motion is normal right and left. No lymphadenopathy.  Neurological: She is alert and oriented to person, place, and time.  Skin: Skin is warm and dry.  Psychiatric: She has a normal mood and affect. Her behavior is normal.    Ortho Exam reflexes 2+ skin is normal right posterior hip incision is completely healed. Leg lengths are equal.  Specialty Comments:  No specialty comments available.  Imaging: No results found.   PMFS History: Patient Active Problem List   Diagnosis Date Noted  . Prosthetic hip infection (HCC)   . Staphylococcus aureus infection   . Alzheimer's dementia with behavioral disturbance   . Postoperative  wound infection of right hip 12/22/2016  . Infection and inflammatory reaction due to internal right hip prosthesis, initial encounter (HCC) 12/22/2016  . S/P total hip arthroplasty 12/22/2016  . SOB (shortness of breath)   . Influenza A 12/06/2016  . Closed fracture of right hip (HCC) 12/05/2016  . Dementia 12/05/2016  . Fall 12/05/2016  . Cough 12/05/2016  . S/P hip hemiarthroplasty 09/16/2016  . Mallet finger, left small finger 09/16/2016  . Hip fracture (HCC) 08/30/2016  . Vascular dementia 08/30/2016  . Osteoporosis 08/30/2016  . Vaginal candida  08/30/2016  . UTI (urinary tract infection) 08/30/2016  . Mild aortic insufficiency: Per 2 d echo 08/04/2015 08/04/2015  . Moderate mitral regurgitation: Per 2 d echo 08/04/2015 08/04/2015  . Glaucoma 08/04/2015  . Dizziness 08/04/2015  . Syncope 08/03/2015  . Depression 12/09/2013  . Constipation 11/30/2013  . Pure hypercholesterolemia 11/30/2013  . Acute posthemorrhagic anemia 11/30/2013  . OA (osteoarthritis) of knee 11/21/2013   Past Medical History:  Diagnosis Date  . Arthritis    oa  . Glaucoma 08/04/2015  . Hyperlipidemia   . Mild aortic insufficiency: Per 2 d echo 08/04/2015 08/04/2015  . Moderate mitral regurgitation: Per 2 d echo 08/04/2015 08/04/2015    No family history on file.  Past Surgical History:  Procedure Laterality Date  . ABDOMINAL HYSTERECTOMY    . benign breast tumor removed  age 68  . HIP ARTHROPLASTY Left 08/31/2016   Procedure: ARTHROPLASTY  HIP (HEMIARTHROPLASTY);  Surgeon: Eldred Manges, MD;  Location: [facility] OR;  Service: Orthopedics;  Laterality: Left;  . HIP ARTHROPLASTY Right 12/06/2016   Procedure: ARTHROPLASTY BIPOLAR HIP (HEMIARTHROPLASTY);  Surgeon: Eldred Manges, MD;  Location: [facility] ORS;  Service: Orthopedics;  Laterality: Right;  . INCISION AND DRAINAGE HIP Right 12/22/2016   Procedure: IRRIGATION AND DEBRIDEMENT HIP WITH ANTIBIOTIC BEAD APPLICATION;  Surgeon: Valeria Batman, MD;  Location: [facility] OR;  Service: Orthopedics;  Laterality: Right;  . TONSILLECTOMY  age 74  . TOTAL KNEE ARTHROPLASTY Right 11/21/2013   Procedure: RIGHT TOTAL KNEE ARTHROPLASTY;  Surgeon: Loanne Drilling, MD;  Location: [facility] ORS;  Service: Orthopedics;  Laterality: Right;  . WRIST FRACTURE SURGERY Right    Social History   Occupational History  . Not on file.   Social History Main Topics  . Smoking status: Former Smoker    Packs/day: 1.00    Years: 17.00    Types: Cigarettes    Quit date: 11/10/1965  . Smokeless tobacco: Never Used  . Alcohol use Yes     Comment: very  occasional wine  . Drug use: No  . Sexual activity: Not on file

## 2017-03-31 DIAGNOSIS — R52 Pain, unspecified: Secondary | ICD-10-CM | POA: Diagnosis not present

## 2017-03-31 DIAGNOSIS — F0150 Vascular dementia without behavioral disturbance: Secondary | ICD-10-CM | POA: Diagnosis not present

## 2017-03-31 DIAGNOSIS — M6281 Muscle weakness (generalized): Secondary | ICD-10-CM | POA: Diagnosis not present

## 2017-03-31 DIAGNOSIS — M810 Age-related osteoporosis without current pathological fracture: Secondary | ICD-10-CM | POA: Diagnosis not present

## 2017-03-31 DIAGNOSIS — H409 Unspecified glaucoma: Secondary | ICD-10-CM | POA: Diagnosis not present

## 2017-03-31 DIAGNOSIS — F329 Major depressive disorder, single episode, unspecified: Secondary | ICD-10-CM | POA: Diagnosis not present

## 2017-04-01 DIAGNOSIS — G309 Alzheimer's disease, unspecified: Secondary | ICD-10-CM | POA: Diagnosis not present

## 2017-04-01 DIAGNOSIS — G4700 Insomnia, unspecified: Secondary | ICD-10-CM | POA: Diagnosis not present

## 2017-04-01 DIAGNOSIS — R600 Localized edema: Secondary | ICD-10-CM | POA: Diagnosis not present

## 2017-04-01 DIAGNOSIS — A4902 Methicillin resistant Staphylococcus aureus infection, unspecified site: Secondary | ICD-10-CM | POA: Diagnosis not present

## 2017-04-01 DIAGNOSIS — F329 Major depressive disorder, single episode, unspecified: Secondary | ICD-10-CM | POA: Diagnosis not present

## 2017-04-01 DIAGNOSIS — R269 Unspecified abnormalities of gait and mobility: Secondary | ICD-10-CM | POA: Diagnosis not present

## 2017-04-01 DIAGNOSIS — R1030 Lower abdominal pain, unspecified: Secondary | ICD-10-CM | POA: Diagnosis not present

## 2017-04-02 DIAGNOSIS — H409 Unspecified glaucoma: Secondary | ICD-10-CM | POA: Diagnosis not present

## 2017-04-02 DIAGNOSIS — F329 Major depressive disorder, single episode, unspecified: Secondary | ICD-10-CM | POA: Diagnosis not present

## 2017-04-02 DIAGNOSIS — M6281 Muscle weakness (generalized): Secondary | ICD-10-CM | POA: Diagnosis not present

## 2017-04-02 DIAGNOSIS — F0150 Vascular dementia without behavioral disturbance: Secondary | ICD-10-CM | POA: Diagnosis not present

## 2017-04-02 DIAGNOSIS — M810 Age-related osteoporosis without current pathological fracture: Secondary | ICD-10-CM | POA: Diagnosis not present

## 2017-04-02 DIAGNOSIS — R52 Pain, unspecified: Secondary | ICD-10-CM | POA: Diagnosis not present

## 2017-04-08 DIAGNOSIS — M6281 Muscle weakness (generalized): Secondary | ICD-10-CM | POA: Diagnosis not present

## 2017-04-08 DIAGNOSIS — M810 Age-related osteoporosis without current pathological fracture: Secondary | ICD-10-CM | POA: Diagnosis not present

## 2017-04-08 DIAGNOSIS — H409 Unspecified glaucoma: Secondary | ICD-10-CM | POA: Diagnosis not present

## 2017-04-08 DIAGNOSIS — R52 Pain, unspecified: Secondary | ICD-10-CM | POA: Diagnosis not present

## 2017-04-08 DIAGNOSIS — F329 Major depressive disorder, single episode, unspecified: Secondary | ICD-10-CM | POA: Diagnosis not present

## 2017-04-08 DIAGNOSIS — F0150 Vascular dementia without behavioral disturbance: Secondary | ICD-10-CM | POA: Diagnosis not present

## 2017-04-09 DIAGNOSIS — M6281 Muscle weakness (generalized): Secondary | ICD-10-CM | POA: Diagnosis not present

## 2017-04-09 DIAGNOSIS — F0150 Vascular dementia without behavioral disturbance: Secondary | ICD-10-CM | POA: Diagnosis not present

## 2017-04-09 DIAGNOSIS — H409 Unspecified glaucoma: Secondary | ICD-10-CM | POA: Diagnosis not present

## 2017-04-09 DIAGNOSIS — F329 Major depressive disorder, single episode, unspecified: Secondary | ICD-10-CM | POA: Diagnosis not present

## 2017-04-09 DIAGNOSIS — M810 Age-related osteoporosis without current pathological fracture: Secondary | ICD-10-CM | POA: Diagnosis not present

## 2017-04-09 DIAGNOSIS — R52 Pain, unspecified: Secondary | ICD-10-CM | POA: Diagnosis not present

## 2017-04-14 DIAGNOSIS — M810 Age-related osteoporosis without current pathological fracture: Secondary | ICD-10-CM | POA: Diagnosis not present

## 2017-04-14 DIAGNOSIS — H409 Unspecified glaucoma: Secondary | ICD-10-CM | POA: Diagnosis not present

## 2017-04-14 DIAGNOSIS — R52 Pain, unspecified: Secondary | ICD-10-CM | POA: Diagnosis not present

## 2017-04-14 DIAGNOSIS — F329 Major depressive disorder, single episode, unspecified: Secondary | ICD-10-CM | POA: Diagnosis not present

## 2017-04-14 DIAGNOSIS — T8452XA Infection and inflammatory reaction due to internal left hip prosthesis, initial encounter: Secondary | ICD-10-CM | POA: Diagnosis not present

## 2017-04-14 DIAGNOSIS — F0151 Vascular dementia with behavioral disturbance: Secondary | ICD-10-CM | POA: Diagnosis not present

## 2017-04-17 DIAGNOSIS — H409 Unspecified glaucoma: Secondary | ICD-10-CM | POA: Diagnosis not present

## 2017-04-17 DIAGNOSIS — R52 Pain, unspecified: Secondary | ICD-10-CM | POA: Diagnosis not present

## 2017-04-17 DIAGNOSIS — F329 Major depressive disorder, single episode, unspecified: Secondary | ICD-10-CM | POA: Diagnosis not present

## 2017-04-17 DIAGNOSIS — M810 Age-related osteoporosis without current pathological fracture: Secondary | ICD-10-CM | POA: Diagnosis not present

## 2017-04-17 DIAGNOSIS — T8452XA Infection and inflammatory reaction due to internal left hip prosthesis, initial encounter: Secondary | ICD-10-CM | POA: Diagnosis not present

## 2017-04-17 DIAGNOSIS — F0151 Vascular dementia with behavioral disturbance: Secondary | ICD-10-CM | POA: Diagnosis not present

## 2017-04-20 DIAGNOSIS — F0151 Vascular dementia with behavioral disturbance: Secondary | ICD-10-CM | POA: Diagnosis not present

## 2017-04-20 DIAGNOSIS — F329 Major depressive disorder, single episode, unspecified: Secondary | ICD-10-CM | POA: Diagnosis not present

## 2017-04-20 DIAGNOSIS — H409 Unspecified glaucoma: Secondary | ICD-10-CM | POA: Diagnosis not present

## 2017-04-20 DIAGNOSIS — T8452XA Infection and inflammatory reaction due to internal left hip prosthesis, initial encounter: Secondary | ICD-10-CM | POA: Diagnosis not present

## 2017-04-20 DIAGNOSIS — M810 Age-related osteoporosis without current pathological fracture: Secondary | ICD-10-CM | POA: Diagnosis not present

## 2017-04-20 DIAGNOSIS — R52 Pain, unspecified: Secondary | ICD-10-CM | POA: Diagnosis not present

## 2017-04-22 DIAGNOSIS — F329 Major depressive disorder, single episode, unspecified: Secondary | ICD-10-CM | POA: Diagnosis not present

## 2017-04-22 DIAGNOSIS — F0151 Vascular dementia with behavioral disturbance: Secondary | ICD-10-CM | POA: Diagnosis not present

## 2017-04-22 DIAGNOSIS — T8452XA Infection and inflammatory reaction due to internal left hip prosthesis, initial encounter: Secondary | ICD-10-CM | POA: Diagnosis not present

## 2017-04-22 DIAGNOSIS — H409 Unspecified glaucoma: Secondary | ICD-10-CM | POA: Diagnosis not present

## 2017-04-22 DIAGNOSIS — M810 Age-related osteoporosis without current pathological fracture: Secondary | ICD-10-CM | POA: Diagnosis not present

## 2017-04-22 DIAGNOSIS — R52 Pain, unspecified: Secondary | ICD-10-CM | POA: Diagnosis not present

## 2017-04-27 DIAGNOSIS — R52 Pain, unspecified: Secondary | ICD-10-CM | POA: Diagnosis not present

## 2017-04-27 DIAGNOSIS — F329 Major depressive disorder, single episode, unspecified: Secondary | ICD-10-CM | POA: Diagnosis not present

## 2017-04-27 DIAGNOSIS — F0151 Vascular dementia with behavioral disturbance: Secondary | ICD-10-CM | POA: Diagnosis not present

## 2017-04-27 DIAGNOSIS — H409 Unspecified glaucoma: Secondary | ICD-10-CM | POA: Diagnosis not present

## 2017-04-27 DIAGNOSIS — T8452XA Infection and inflammatory reaction due to internal left hip prosthesis, initial encounter: Secondary | ICD-10-CM | POA: Diagnosis not present

## 2017-04-27 DIAGNOSIS — M810 Age-related osteoporosis without current pathological fracture: Secondary | ICD-10-CM | POA: Diagnosis not present

## 2017-04-29 DIAGNOSIS — H409 Unspecified glaucoma: Secondary | ICD-10-CM | POA: Diagnosis not present

## 2017-04-29 DIAGNOSIS — R609 Edema, unspecified: Secondary | ICD-10-CM | POA: Diagnosis not present

## 2017-04-29 DIAGNOSIS — M810 Age-related osteoporosis without current pathological fracture: Secondary | ICD-10-CM | POA: Diagnosis not present

## 2017-04-29 DIAGNOSIS — R52 Pain, unspecified: Secondary | ICD-10-CM | POA: Diagnosis not present

## 2017-04-29 DIAGNOSIS — T8452XA Infection and inflammatory reaction due to internal left hip prosthesis, initial encounter: Secondary | ICD-10-CM | POA: Diagnosis not present

## 2017-04-29 DIAGNOSIS — F0151 Vascular dementia with behavioral disturbance: Secondary | ICD-10-CM | POA: Diagnosis not present

## 2017-04-29 DIAGNOSIS — G4700 Insomnia, unspecified: Secondary | ICD-10-CM | POA: Diagnosis not present

## 2017-04-29 DIAGNOSIS — G309 Alzheimer's disease, unspecified: Secondary | ICD-10-CM | POA: Diagnosis not present

## 2017-04-29 DIAGNOSIS — F329 Major depressive disorder, single episode, unspecified: Secondary | ICD-10-CM | POA: Diagnosis not present

## 2017-05-04 DIAGNOSIS — F0151 Vascular dementia with behavioral disturbance: Secondary | ICD-10-CM | POA: Diagnosis not present

## 2017-05-04 DIAGNOSIS — M810 Age-related osteoporosis without current pathological fracture: Secondary | ICD-10-CM | POA: Diagnosis not present

## 2017-05-04 DIAGNOSIS — H409 Unspecified glaucoma: Secondary | ICD-10-CM | POA: Diagnosis not present

## 2017-05-04 DIAGNOSIS — F329 Major depressive disorder, single episode, unspecified: Secondary | ICD-10-CM | POA: Diagnosis not present

## 2017-05-04 DIAGNOSIS — R52 Pain, unspecified: Secondary | ICD-10-CM | POA: Diagnosis not present

## 2017-05-04 DIAGNOSIS — T8452XA Infection and inflammatory reaction due to internal left hip prosthesis, initial encounter: Secondary | ICD-10-CM | POA: Diagnosis not present

## 2017-05-07 DIAGNOSIS — R52 Pain, unspecified: Secondary | ICD-10-CM | POA: Diagnosis not present

## 2017-05-07 DIAGNOSIS — M810 Age-related osteoporosis without current pathological fracture: Secondary | ICD-10-CM | POA: Diagnosis not present

## 2017-05-07 DIAGNOSIS — F0151 Vascular dementia with behavioral disturbance: Secondary | ICD-10-CM | POA: Diagnosis not present

## 2017-05-07 DIAGNOSIS — F329 Major depressive disorder, single episode, unspecified: Secondary | ICD-10-CM | POA: Diagnosis not present

## 2017-05-07 DIAGNOSIS — T8452XA Infection and inflammatory reaction due to internal left hip prosthesis, initial encounter: Secondary | ICD-10-CM | POA: Diagnosis not present

## 2017-05-07 DIAGNOSIS — H409 Unspecified glaucoma: Secondary | ICD-10-CM | POA: Diagnosis not present

## 2017-05-11 DIAGNOSIS — R609 Edema, unspecified: Secondary | ICD-10-CM | POA: Diagnosis not present

## 2017-05-11 DIAGNOSIS — M810 Age-related osteoporosis without current pathological fracture: Secondary | ICD-10-CM | POA: Diagnosis not present

## 2017-05-11 DIAGNOSIS — F329 Major depressive disorder, single episode, unspecified: Secondary | ICD-10-CM | POA: Diagnosis not present

## 2017-05-11 DIAGNOSIS — G309 Alzheimer's disease, unspecified: Secondary | ICD-10-CM | POA: Diagnosis not present

## 2017-05-11 DIAGNOSIS — G4700 Insomnia, unspecified: Secondary | ICD-10-CM | POA: Diagnosis not present

## 2017-05-11 DIAGNOSIS — H409 Unspecified glaucoma: Secondary | ICD-10-CM | POA: Diagnosis not present

## 2017-05-11 DIAGNOSIS — R269 Unspecified abnormalities of gait and mobility: Secondary | ICD-10-CM | POA: Diagnosis not present

## 2017-05-11 DIAGNOSIS — T8452XA Infection and inflammatory reaction due to internal left hip prosthesis, initial encounter: Secondary | ICD-10-CM | POA: Diagnosis not present

## 2017-05-11 DIAGNOSIS — R52 Pain, unspecified: Secondary | ICD-10-CM | POA: Diagnosis not present

## 2017-05-11 DIAGNOSIS — F0151 Vascular dementia with behavioral disturbance: Secondary | ICD-10-CM | POA: Diagnosis not present

## 2017-05-15 DIAGNOSIS — H409 Unspecified glaucoma: Secondary | ICD-10-CM | POA: Diagnosis not present

## 2017-05-15 DIAGNOSIS — R52 Pain, unspecified: Secondary | ICD-10-CM | POA: Diagnosis not present

## 2017-05-15 DIAGNOSIS — M810 Age-related osteoporosis without current pathological fracture: Secondary | ICD-10-CM | POA: Diagnosis not present

## 2017-05-15 DIAGNOSIS — F0151 Vascular dementia with behavioral disturbance: Secondary | ICD-10-CM | POA: Diagnosis not present

## 2017-05-15 DIAGNOSIS — T8452XA Infection and inflammatory reaction due to internal left hip prosthesis, initial encounter: Secondary | ICD-10-CM | POA: Diagnosis not present

## 2017-05-15 DIAGNOSIS — F329 Major depressive disorder, single episode, unspecified: Secondary | ICD-10-CM | POA: Diagnosis not present

## 2017-05-18 DIAGNOSIS — R52 Pain, unspecified: Secondary | ICD-10-CM | POA: Diagnosis not present

## 2017-05-18 DIAGNOSIS — F0151 Vascular dementia with behavioral disturbance: Secondary | ICD-10-CM | POA: Diagnosis not present

## 2017-05-18 DIAGNOSIS — F329 Major depressive disorder, single episode, unspecified: Secondary | ICD-10-CM | POA: Diagnosis not present

## 2017-05-18 DIAGNOSIS — T8452XA Infection and inflammatory reaction due to internal left hip prosthesis, initial encounter: Secondary | ICD-10-CM | POA: Diagnosis not present

## 2017-05-18 DIAGNOSIS — F0391 Unspecified dementia with behavioral disturbance: Secondary | ICD-10-CM | POA: Diagnosis not present

## 2017-05-18 DIAGNOSIS — H409 Unspecified glaucoma: Secondary | ICD-10-CM | POA: Diagnosis not present

## 2017-05-18 DIAGNOSIS — M810 Age-related osteoporosis without current pathological fracture: Secondary | ICD-10-CM | POA: Diagnosis not present

## 2017-05-20 DIAGNOSIS — F329 Major depressive disorder, single episode, unspecified: Secondary | ICD-10-CM | POA: Diagnosis not present

## 2017-05-20 DIAGNOSIS — M1990 Unspecified osteoarthritis, unspecified site: Secondary | ICD-10-CM | POA: Diagnosis not present

## 2017-05-20 DIAGNOSIS — T8452XA Infection and inflammatory reaction due to internal left hip prosthesis, initial encounter: Secondary | ICD-10-CM | POA: Diagnosis not present

## 2017-05-20 DIAGNOSIS — R52 Pain, unspecified: Secondary | ICD-10-CM | POA: Diagnosis not present

## 2017-05-20 DIAGNOSIS — H409 Unspecified glaucoma: Secondary | ICD-10-CM | POA: Diagnosis not present

## 2017-05-20 DIAGNOSIS — R2689 Other abnormalities of gait and mobility: Secondary | ICD-10-CM | POA: Diagnosis not present

## 2017-05-20 DIAGNOSIS — F0151 Vascular dementia with behavioral disturbance: Secondary | ICD-10-CM | POA: Diagnosis not present

## 2017-05-20 DIAGNOSIS — F418 Other specified anxiety disorders: Secondary | ICD-10-CM | POA: Diagnosis not present

## 2017-05-20 DIAGNOSIS — G309 Alzheimer's disease, unspecified: Secondary | ICD-10-CM | POA: Diagnosis not present

## 2017-05-20 DIAGNOSIS — T8459XS Infection and inflammatory reaction due to other internal joint prosthesis, sequela: Secondary | ICD-10-CM | POA: Diagnosis not present

## 2017-05-20 DIAGNOSIS — M810 Age-related osteoporosis without current pathological fracture: Secondary | ICD-10-CM | POA: Diagnosis not present

## 2017-05-24 IMAGING — DX DG CHEST 1V
1 series · 1 of 1 positions shown · non-contrast
Comparison: 08/03/2015

CLINICAL DATA: Fall, left hip fracture

EXAM:
CHEST 1 VIEW

[t chest supine]
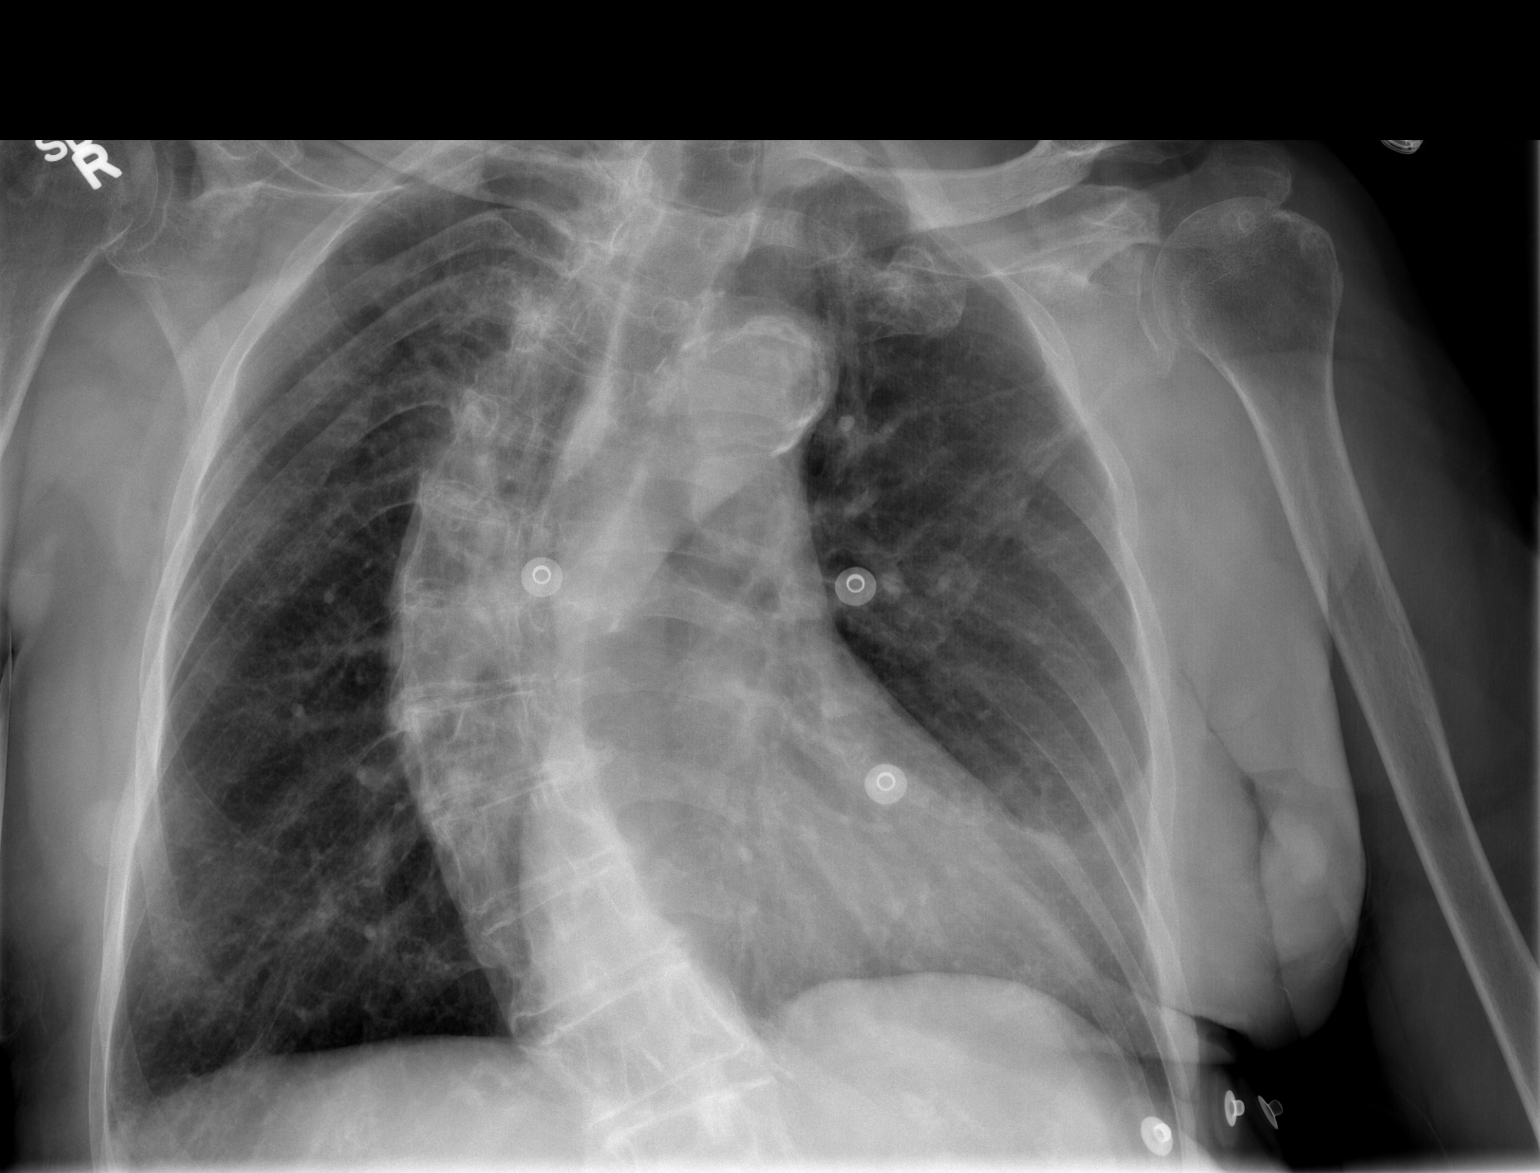

[1 of 1 positions shown; findings below may reference images not displayed]

FINDINGS: Stable heart size and vascularity. No focal airspace process,
collapse or consolidation. Negative for edema, effusion or
pneumothorax. Atherosclerosis noted of the aorta. Advanced
degenerative changes of the spine with a chronic scoliosis. Bones
are osteopenic.
IMPRESSION: Stable chest exam. No superimposed acute process or interval change.

Aortic atherosclerosis

Marked scoliosis as before.

## 2017-05-24 IMAGING — DX DG HIP (WITH OR WITHOUT PELVIS) 2-3V*L*
4 series · 4 of 4 positions shown · non-contrast
Comparison: None available

CLINICAL DATA: Fall, acute right hip injury and pain

EXAM:
DG HIP (WITH OR WITHOUT PELVIS) 2-3V LEFT

[t pelvis ap]
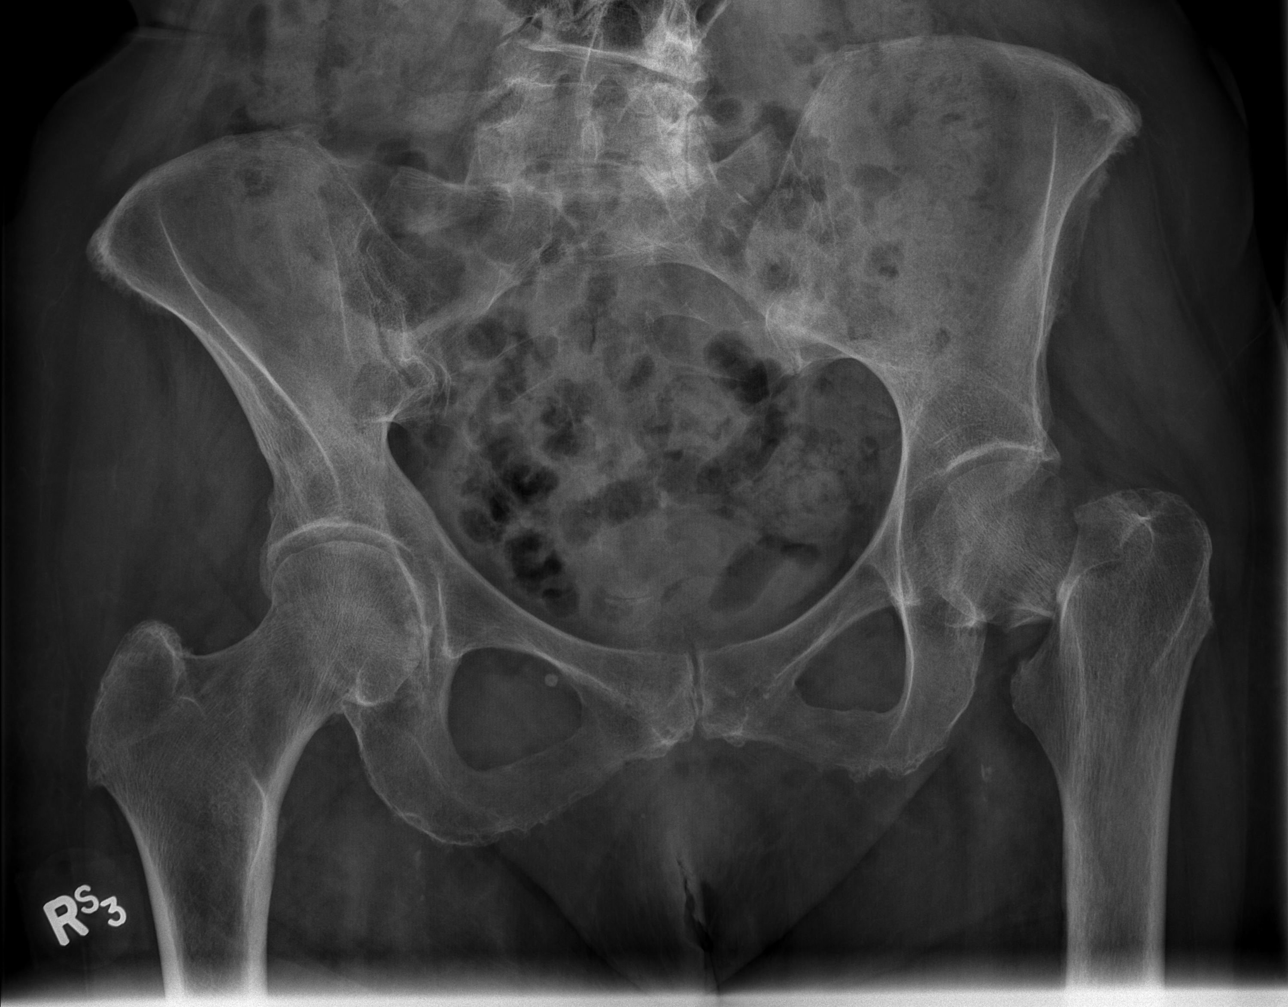

[t hip ap left]
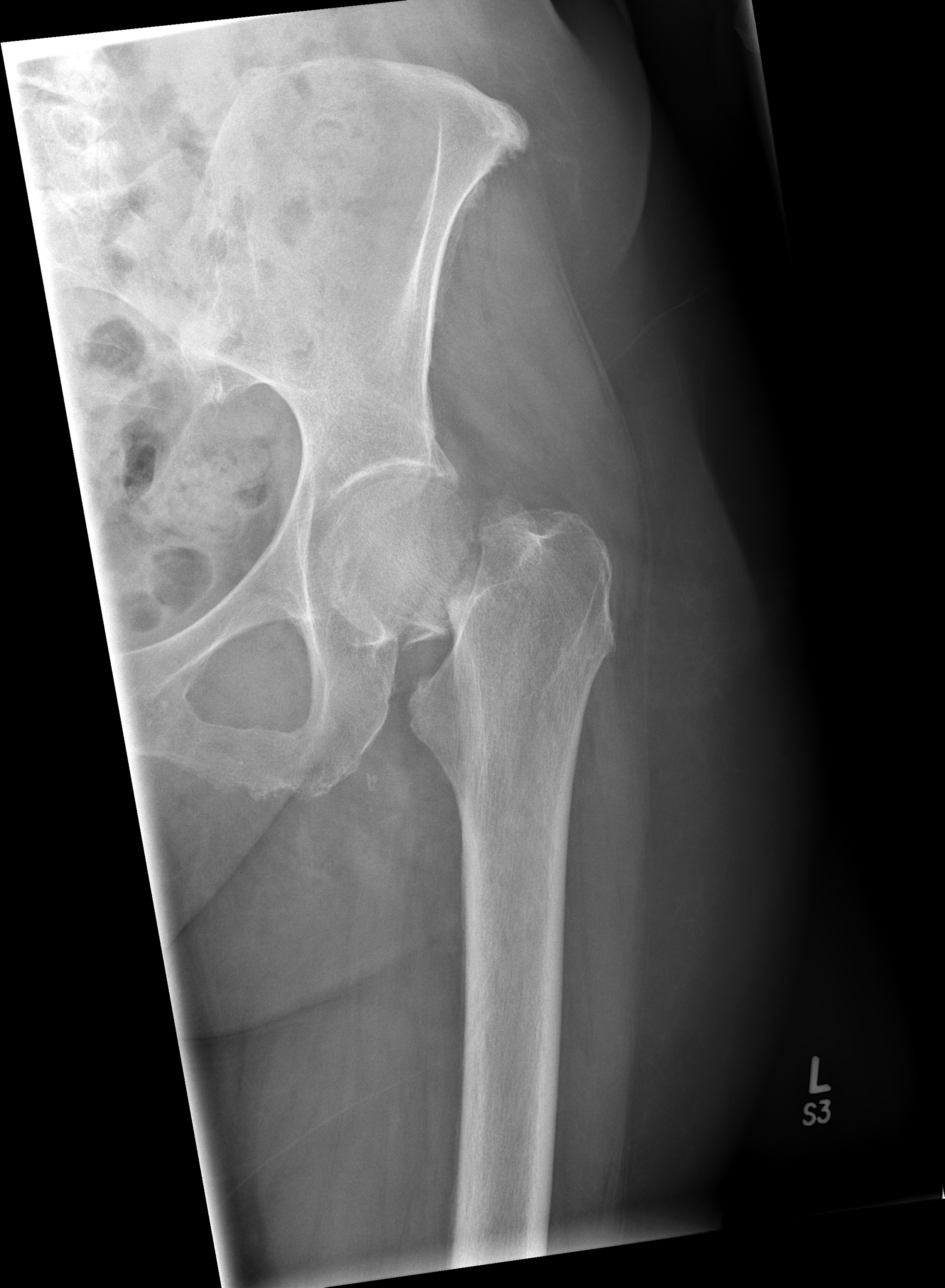

[x hip lat left]
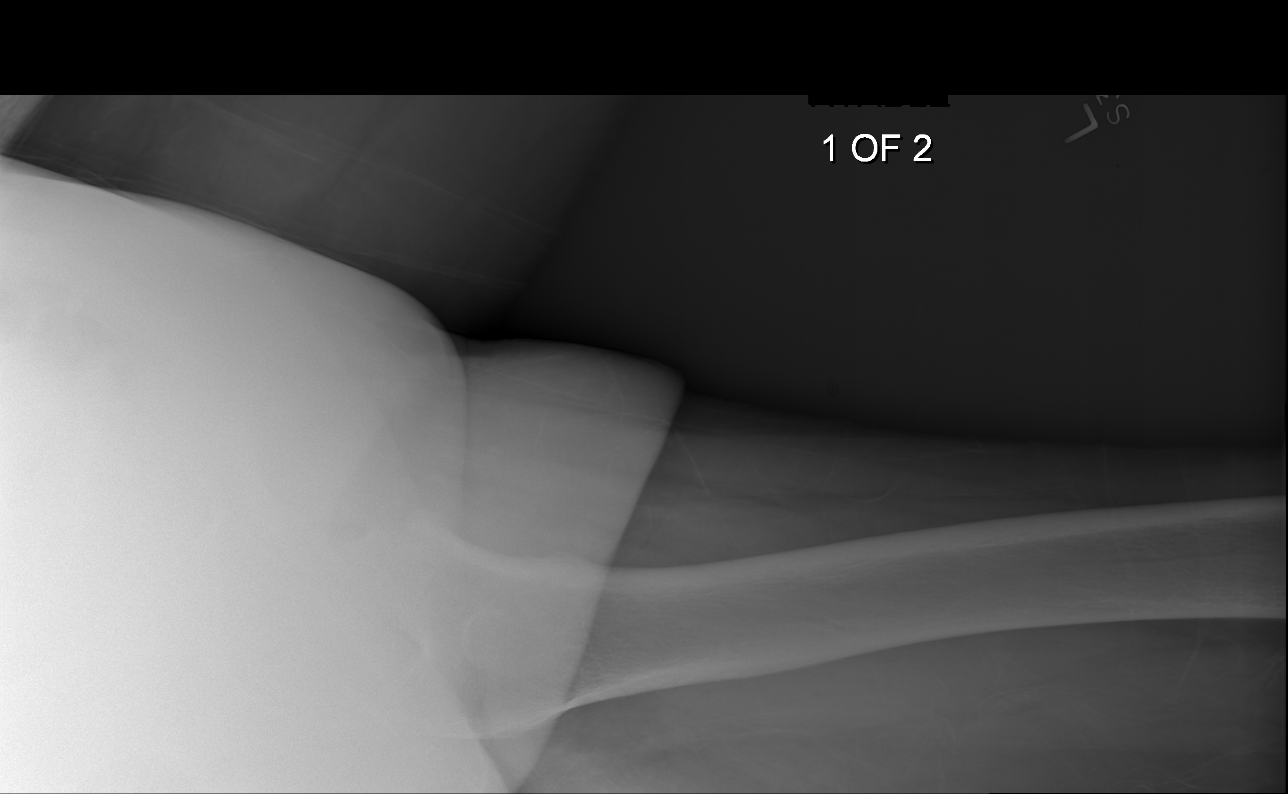

[w hip lat left]
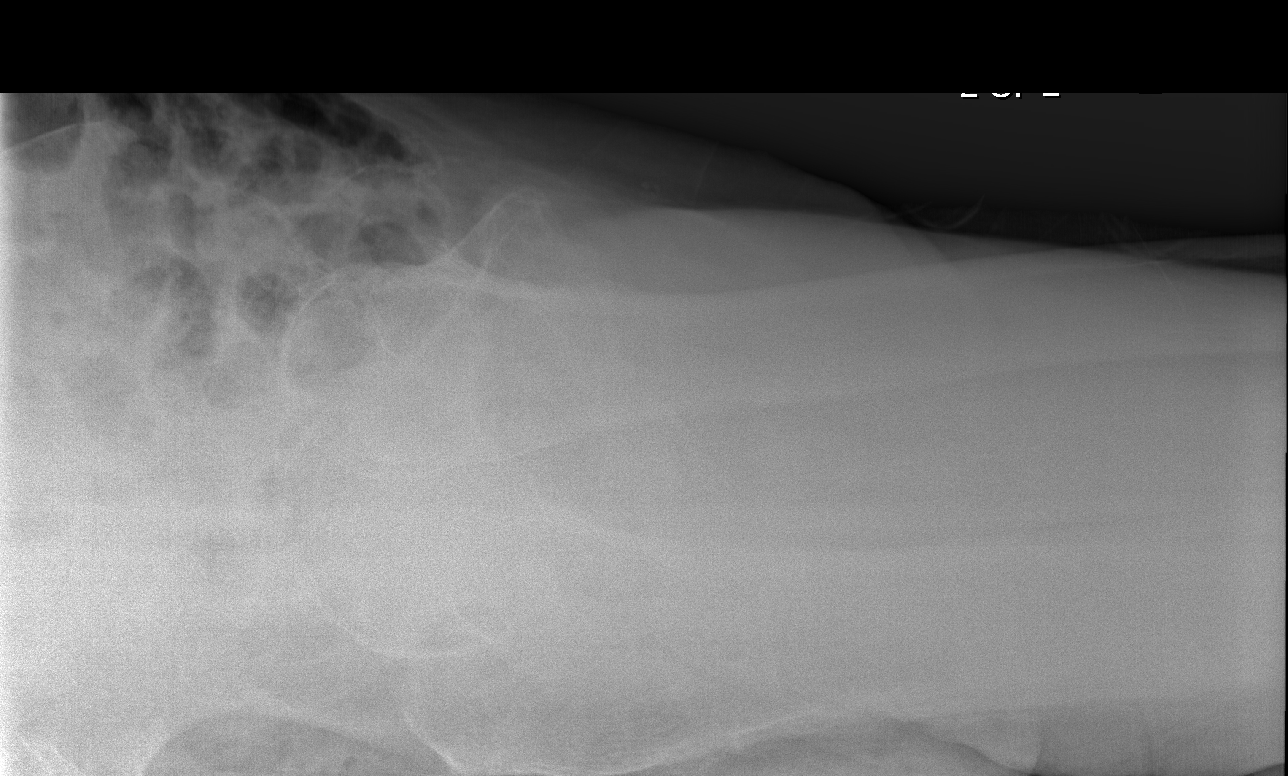

[4 of 4 positions shown; findings below may reference images not displayed]

FINDINGS: There is an acute proximal left hip femoral neck fracture with mild
displacement and angulation. Femoral head remains located in
relation to the left acetabulum. Bones are osteopenic. Bony pelvis
and right hip are intact. Degenerative changes present of the lumbar
spine and SI joints. Normal bowel gas pattern. Peripheral
atherosclerosis noted.
IMPRESSION: Acute left hip proximal femoral neck fracture with mild angulation
and displacement.

## 2017-05-25 DIAGNOSIS — T8452XA Infection and inflammatory reaction due to internal left hip prosthesis, initial encounter: Secondary | ICD-10-CM | POA: Diagnosis not present

## 2017-05-25 DIAGNOSIS — F329 Major depressive disorder, single episode, unspecified: Secondary | ICD-10-CM | POA: Diagnosis not present

## 2017-05-25 DIAGNOSIS — F0151 Vascular dementia with behavioral disturbance: Secondary | ICD-10-CM | POA: Diagnosis not present

## 2017-05-25 DIAGNOSIS — H409 Unspecified glaucoma: Secondary | ICD-10-CM | POA: Diagnosis not present

## 2017-05-25 DIAGNOSIS — R52 Pain, unspecified: Secondary | ICD-10-CM | POA: Diagnosis not present

## 2017-05-25 DIAGNOSIS — M810 Age-related osteoporosis without current pathological fracture: Secondary | ICD-10-CM | POA: Diagnosis not present

## 2017-05-27 DIAGNOSIS — M810 Age-related osteoporosis without current pathological fracture: Secondary | ICD-10-CM | POA: Diagnosis not present

## 2017-05-27 DIAGNOSIS — T8452XA Infection and inflammatory reaction due to internal left hip prosthesis, initial encounter: Secondary | ICD-10-CM | POA: Diagnosis not present

## 2017-05-27 DIAGNOSIS — R52 Pain, unspecified: Secondary | ICD-10-CM | POA: Diagnosis not present

## 2017-05-27 DIAGNOSIS — H409 Unspecified glaucoma: Secondary | ICD-10-CM | POA: Diagnosis not present

## 2017-05-27 DIAGNOSIS — F329 Major depressive disorder, single episode, unspecified: Secondary | ICD-10-CM | POA: Diagnosis not present

## 2017-05-27 DIAGNOSIS — F0151 Vascular dementia with behavioral disturbance: Secondary | ICD-10-CM | POA: Diagnosis not present

## 2017-06-03 DIAGNOSIS — F329 Major depressive disorder, single episode, unspecified: Secondary | ICD-10-CM | POA: Diagnosis not present

## 2017-06-03 DIAGNOSIS — M810 Age-related osteoporosis without current pathological fracture: Secondary | ICD-10-CM | POA: Diagnosis not present

## 2017-06-03 DIAGNOSIS — R52 Pain, unspecified: Secondary | ICD-10-CM | POA: Diagnosis not present

## 2017-06-03 DIAGNOSIS — T8452XA Infection and inflammatory reaction due to internal left hip prosthesis, initial encounter: Secondary | ICD-10-CM | POA: Diagnosis not present

## 2017-06-03 DIAGNOSIS — F0151 Vascular dementia with behavioral disturbance: Secondary | ICD-10-CM | POA: Diagnosis not present

## 2017-06-03 DIAGNOSIS — H409 Unspecified glaucoma: Secondary | ICD-10-CM | POA: Diagnosis not present

## 2017-06-04 DIAGNOSIS — T8452XA Infection and inflammatory reaction due to internal left hip prosthesis, initial encounter: Secondary | ICD-10-CM | POA: Diagnosis not present

## 2017-06-04 DIAGNOSIS — H401134 Primary open-angle glaucoma, bilateral, indeterminate stage: Secondary | ICD-10-CM | POA: Diagnosis not present

## 2017-06-04 DIAGNOSIS — H353190 Nonexudative age-related macular degeneration, unspecified eye, stage unspecified: Secondary | ICD-10-CM | POA: Diagnosis not present

## 2017-06-04 DIAGNOSIS — Z961 Presence of intraocular lens: Secondary | ICD-10-CM | POA: Diagnosis not present

## 2017-06-04 DIAGNOSIS — M810 Age-related osteoporosis without current pathological fracture: Secondary | ICD-10-CM | POA: Diagnosis not present

## 2017-06-04 DIAGNOSIS — H409 Unspecified glaucoma: Secondary | ICD-10-CM | POA: Diagnosis not present

## 2017-06-04 DIAGNOSIS — R52 Pain, unspecified: Secondary | ICD-10-CM | POA: Diagnosis not present

## 2017-06-04 DIAGNOSIS — F329 Major depressive disorder, single episode, unspecified: Secondary | ICD-10-CM | POA: Diagnosis not present

## 2017-06-04 DIAGNOSIS — F0151 Vascular dementia with behavioral disturbance: Secondary | ICD-10-CM | POA: Diagnosis not present

## 2017-06-08 DIAGNOSIS — R52 Pain, unspecified: Secondary | ICD-10-CM | POA: Diagnosis not present

## 2017-06-08 DIAGNOSIS — T8452XA Infection and inflammatory reaction due to internal left hip prosthesis, initial encounter: Secondary | ICD-10-CM | POA: Diagnosis not present

## 2017-06-08 DIAGNOSIS — M810 Age-related osteoporosis without current pathological fracture: Secondary | ICD-10-CM | POA: Diagnosis not present

## 2017-06-08 DIAGNOSIS — H409 Unspecified glaucoma: Secondary | ICD-10-CM | POA: Diagnosis not present

## 2017-06-08 DIAGNOSIS — F329 Major depressive disorder, single episode, unspecified: Secondary | ICD-10-CM | POA: Diagnosis not present

## 2017-06-08 DIAGNOSIS — F0151 Vascular dementia with behavioral disturbance: Secondary | ICD-10-CM | POA: Diagnosis not present

## 2017-06-10 DIAGNOSIS — F329 Major depressive disorder, single episode, unspecified: Secondary | ICD-10-CM | POA: Diagnosis not present

## 2017-06-10 DIAGNOSIS — R52 Pain, unspecified: Secondary | ICD-10-CM | POA: Diagnosis not present

## 2017-06-10 DIAGNOSIS — M810 Age-related osteoporosis without current pathological fracture: Secondary | ICD-10-CM | POA: Diagnosis not present

## 2017-06-10 DIAGNOSIS — F0151 Vascular dementia with behavioral disturbance: Secondary | ICD-10-CM | POA: Diagnosis not present

## 2017-06-10 DIAGNOSIS — T8452XA Infection and inflammatory reaction due to internal left hip prosthesis, initial encounter: Secondary | ICD-10-CM | POA: Diagnosis not present

## 2017-06-10 DIAGNOSIS — H409 Unspecified glaucoma: Secondary | ICD-10-CM | POA: Diagnosis not present

## 2017-06-13 DIAGNOSIS — T8452XA Infection and inflammatory reaction due to internal left hip prosthesis, initial encounter: Secondary | ICD-10-CM | POA: Diagnosis not present

## 2017-06-13 DIAGNOSIS — M810 Age-related osteoporosis without current pathological fracture: Secondary | ICD-10-CM | POA: Diagnosis not present

## 2017-06-13 DIAGNOSIS — F329 Major depressive disorder, single episode, unspecified: Secondary | ICD-10-CM | POA: Diagnosis not present

## 2017-06-13 DIAGNOSIS — G309 Alzheimer's disease, unspecified: Secondary | ICD-10-CM | POA: Diagnosis not present

## 2017-06-13 DIAGNOSIS — F0151 Vascular dementia with behavioral disturbance: Secondary | ICD-10-CM | POA: Diagnosis not present

## 2017-06-13 DIAGNOSIS — F0280 Dementia in other diseases classified elsewhere without behavioral disturbance: Secondary | ICD-10-CM | POA: Diagnosis not present

## 2017-06-15 DIAGNOSIS — F0280 Dementia in other diseases classified elsewhere without behavioral disturbance: Secondary | ICD-10-CM | POA: Diagnosis not present

## 2017-06-15 DIAGNOSIS — R609 Edema, unspecified: Secondary | ICD-10-CM | POA: Diagnosis not present

## 2017-06-15 DIAGNOSIS — G309 Alzheimer's disease, unspecified: Secondary | ICD-10-CM | POA: Diagnosis not present

## 2017-06-15 DIAGNOSIS — S7002XA Contusion of left hip, initial encounter: Secondary | ICD-10-CM | POA: Diagnosis not present

## 2017-06-15 DIAGNOSIS — F0391 Unspecified dementia with behavioral disturbance: Secondary | ICD-10-CM | POA: Diagnosis not present

## 2017-06-15 DIAGNOSIS — T8452XA Infection and inflammatory reaction due to internal left hip prosthesis, initial encounter: Secondary | ICD-10-CM | POA: Diagnosis not present

## 2017-06-15 DIAGNOSIS — M810 Age-related osteoporosis without current pathological fracture: Secondary | ICD-10-CM | POA: Diagnosis not present

## 2017-06-15 DIAGNOSIS — F0151 Vascular dementia with behavioral disturbance: Secondary | ICD-10-CM | POA: Diagnosis not present

## 2017-06-15 DIAGNOSIS — F329 Major depressive disorder, single episode, unspecified: Secondary | ICD-10-CM | POA: Diagnosis not present

## 2017-06-15 DIAGNOSIS — R269 Unspecified abnormalities of gait and mobility: Secondary | ICD-10-CM | POA: Diagnosis not present

## 2017-06-17 DIAGNOSIS — B9562 Methicillin resistant Staphylococcus aureus infection as the cause of diseases classified elsewhere: Secondary | ICD-10-CM | POA: Diagnosis not present

## 2017-06-17 DIAGNOSIS — M810 Age-related osteoporosis without current pathological fracture: Secondary | ICD-10-CM | POA: Diagnosis not present

## 2017-06-17 DIAGNOSIS — G309 Alzheimer's disease, unspecified: Secondary | ICD-10-CM | POA: Diagnosis not present

## 2017-06-17 DIAGNOSIS — S7002XS Contusion of left hip, sequela: Secondary | ICD-10-CM | POA: Diagnosis not present

## 2017-06-17 DIAGNOSIS — G308 Other Alzheimer's disease: Secondary | ICD-10-CM | POA: Diagnosis not present

## 2017-06-17 DIAGNOSIS — F0151 Vascular dementia with behavioral disturbance: Secondary | ICD-10-CM | POA: Diagnosis not present

## 2017-06-17 DIAGNOSIS — G4709 Other insomnia: Secondary | ICD-10-CM | POA: Diagnosis not present

## 2017-06-17 DIAGNOSIS — F329 Major depressive disorder, single episode, unspecified: Secondary | ICD-10-CM | POA: Diagnosis not present

## 2017-06-17 DIAGNOSIS — R269 Unspecified abnormalities of gait and mobility: Secondary | ICD-10-CM | POA: Diagnosis not present

## 2017-06-17 DIAGNOSIS — T8452XA Infection and inflammatory reaction due to internal left hip prosthesis, initial encounter: Secondary | ICD-10-CM | POA: Diagnosis not present

## 2017-06-17 DIAGNOSIS — M25552 Pain in left hip: Secondary | ICD-10-CM | POA: Diagnosis not present

## 2017-06-17 DIAGNOSIS — F0280 Dementia in other diseases classified elsewhere without behavioral disturbance: Secondary | ICD-10-CM | POA: Diagnosis not present

## 2017-06-22 DIAGNOSIS — R269 Unspecified abnormalities of gait and mobility: Secondary | ICD-10-CM | POA: Diagnosis not present

## 2017-06-22 DIAGNOSIS — F329 Major depressive disorder, single episode, unspecified: Secondary | ICD-10-CM | POA: Diagnosis not present

## 2017-06-22 DIAGNOSIS — R609 Edema, unspecified: Secondary | ICD-10-CM | POA: Diagnosis not present

## 2017-06-22 DIAGNOSIS — G309 Alzheimer's disease, unspecified: Secondary | ICD-10-CM | POA: Diagnosis not present

## 2017-06-22 DIAGNOSIS — T8452XA Infection and inflammatory reaction due to internal left hip prosthesis, initial encounter: Secondary | ICD-10-CM | POA: Diagnosis not present

## 2017-06-22 DIAGNOSIS — F0151 Vascular dementia with behavioral disturbance: Secondary | ICD-10-CM | POA: Diagnosis not present

## 2017-06-22 DIAGNOSIS — M810 Age-related osteoporosis without current pathological fracture: Secondary | ICD-10-CM | POA: Diagnosis not present

## 2017-06-22 DIAGNOSIS — F0280 Dementia in other diseases classified elsewhere without behavioral disturbance: Secondary | ICD-10-CM | POA: Diagnosis not present

## 2017-06-22 DIAGNOSIS — G4700 Insomnia, unspecified: Secondary | ICD-10-CM | POA: Diagnosis not present

## 2017-06-24 DIAGNOSIS — F0280 Dementia in other diseases classified elsewhere without behavioral disturbance: Secondary | ICD-10-CM | POA: Diagnosis not present

## 2017-06-24 DIAGNOSIS — F329 Major depressive disorder, single episode, unspecified: Secondary | ICD-10-CM | POA: Diagnosis not present

## 2017-06-24 DIAGNOSIS — M810 Age-related osteoporosis without current pathological fracture: Secondary | ICD-10-CM | POA: Diagnosis not present

## 2017-06-24 DIAGNOSIS — G309 Alzheimer's disease, unspecified: Secondary | ICD-10-CM | POA: Diagnosis not present

## 2017-06-24 DIAGNOSIS — F0151 Vascular dementia with behavioral disturbance: Secondary | ICD-10-CM | POA: Diagnosis not present

## 2017-06-24 DIAGNOSIS — T8452XA Infection and inflammatory reaction due to internal left hip prosthesis, initial encounter: Secondary | ICD-10-CM | POA: Diagnosis not present

## 2017-06-29 DIAGNOSIS — F329 Major depressive disorder, single episode, unspecified: Secondary | ICD-10-CM | POA: Diagnosis not present

## 2017-06-29 DIAGNOSIS — F0280 Dementia in other diseases classified elsewhere without behavioral disturbance: Secondary | ICD-10-CM | POA: Diagnosis not present

## 2017-06-29 DIAGNOSIS — M810 Age-related osteoporosis without current pathological fracture: Secondary | ICD-10-CM | POA: Diagnosis not present

## 2017-06-29 DIAGNOSIS — F0151 Vascular dementia with behavioral disturbance: Secondary | ICD-10-CM | POA: Diagnosis not present

## 2017-06-29 DIAGNOSIS — G309 Alzheimer's disease, unspecified: Secondary | ICD-10-CM | POA: Diagnosis not present

## 2017-06-29 DIAGNOSIS — T8452XA Infection and inflammatory reaction due to internal left hip prosthesis, initial encounter: Secondary | ICD-10-CM | POA: Diagnosis not present

## 2017-07-02 DIAGNOSIS — F329 Major depressive disorder, single episode, unspecified: Secondary | ICD-10-CM | POA: Diagnosis not present

## 2017-07-02 DIAGNOSIS — G309 Alzheimer's disease, unspecified: Secondary | ICD-10-CM | POA: Diagnosis not present

## 2017-07-02 DIAGNOSIS — F0151 Vascular dementia with behavioral disturbance: Secondary | ICD-10-CM | POA: Diagnosis not present

## 2017-07-02 DIAGNOSIS — F0280 Dementia in other diseases classified elsewhere without behavioral disturbance: Secondary | ICD-10-CM | POA: Diagnosis not present

## 2017-07-02 DIAGNOSIS — M810 Age-related osteoporosis without current pathological fracture: Secondary | ICD-10-CM | POA: Diagnosis not present

## 2017-07-02 DIAGNOSIS — T8452XA Infection and inflammatory reaction due to internal left hip prosthesis, initial encounter: Secondary | ICD-10-CM | POA: Diagnosis not present

## 2017-07-06 DIAGNOSIS — I872 Venous insufficiency (chronic) (peripheral): Secondary | ICD-10-CM | POA: Diagnosis not present

## 2017-07-06 DIAGNOSIS — M810 Age-related osteoporosis without current pathological fracture: Secondary | ICD-10-CM | POA: Diagnosis not present

## 2017-07-06 DIAGNOSIS — M79676 Pain in unspecified toe(s): Secondary | ICD-10-CM | POA: Diagnosis not present

## 2017-07-06 DIAGNOSIS — M24576 Contracture, unspecified foot: Secondary | ICD-10-CM | POA: Diagnosis not present

## 2017-07-06 DIAGNOSIS — B351 Tinea unguium: Secondary | ICD-10-CM | POA: Diagnosis not present

## 2017-07-06 DIAGNOSIS — F329 Major depressive disorder, single episode, unspecified: Secondary | ICD-10-CM | POA: Diagnosis not present

## 2017-07-06 DIAGNOSIS — G309 Alzheimer's disease, unspecified: Secondary | ICD-10-CM | POA: Diagnosis not present

## 2017-07-06 DIAGNOSIS — T8452XA Infection and inflammatory reaction due to internal left hip prosthesis, initial encounter: Secondary | ICD-10-CM | POA: Diagnosis not present

## 2017-07-06 DIAGNOSIS — L605 Yellow nail syndrome: Secondary | ICD-10-CM | POA: Diagnosis not present

## 2017-07-06 DIAGNOSIS — F0280 Dementia in other diseases classified elsewhere without behavioral disturbance: Secondary | ICD-10-CM | POA: Diagnosis not present

## 2017-07-06 DIAGNOSIS — R609 Edema, unspecified: Secondary | ICD-10-CM | POA: Diagnosis not present

## 2017-07-06 DIAGNOSIS — F0151 Vascular dementia with behavioral disturbance: Secondary | ICD-10-CM | POA: Diagnosis not present

## 2017-07-08 DIAGNOSIS — G309 Alzheimer's disease, unspecified: Secondary | ICD-10-CM | POA: Diagnosis not present

## 2017-07-08 DIAGNOSIS — Z79899 Other long term (current) drug therapy: Secondary | ICD-10-CM | POA: Diagnosis not present

## 2017-07-08 DIAGNOSIS — M810 Age-related osteoporosis without current pathological fracture: Secondary | ICD-10-CM | POA: Diagnosis not present

## 2017-07-08 DIAGNOSIS — F0280 Dementia in other diseases classified elsewhere without behavioral disturbance: Secondary | ICD-10-CM | POA: Diagnosis not present

## 2017-07-08 DIAGNOSIS — F329 Major depressive disorder, single episode, unspecified: Secondary | ICD-10-CM | POA: Diagnosis not present

## 2017-07-08 DIAGNOSIS — F0151 Vascular dementia with behavioral disturbance: Secondary | ICD-10-CM | POA: Diagnosis not present

## 2017-07-08 DIAGNOSIS — T8452XA Infection and inflammatory reaction due to internal left hip prosthesis, initial encounter: Secondary | ICD-10-CM | POA: Diagnosis not present

## 2017-07-20 DIAGNOSIS — F0391 Unspecified dementia with behavioral disturbance: Secondary | ICD-10-CM | POA: Diagnosis not present

## 2017-07-20 DIAGNOSIS — F329 Major depressive disorder, single episode, unspecified: Secondary | ICD-10-CM | POA: Diagnosis not present

## 2017-08-17 DIAGNOSIS — F0391 Unspecified dementia with behavioral disturbance: Secondary | ICD-10-CM | POA: Diagnosis not present

## 2017-08-17 DIAGNOSIS — F329 Major depressive disorder, single episode, unspecified: Secondary | ICD-10-CM | POA: Diagnosis not present

## 2017-08-28 DIAGNOSIS — M810 Age-related osteoporosis without current pathological fracture: Secondary | ICD-10-CM | POA: Diagnosis not present

## 2017-08-29 IMAGING — CT CT HEAD W/O CM
3 of 8 series · 14 of 47 positions shown, 17 images · non-contrast
Comparison: 08/03/2015

CLINICAL DATA: Dizziness with subsequent fall.

EXAM:
CT HEAD WITHOUT CONTRAST
CT CERVICAL SPINE WITHOUT CONTRAST
TECHNIQUE: Multidetector CT imaging of the head and cervical spine was
performed following the standard protocol without intravenous
contrast. Multiplanar CT image reconstructions of the cervical spine
were also generated.

[Series 9: coronal · coronal · 0.31mm/px · 3 of 76 slices shown]
[im 16/76  brain]
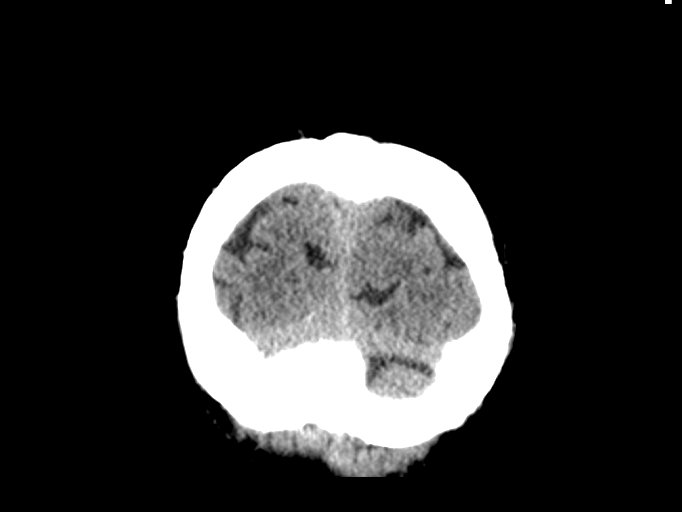
[im 31/76  brain]
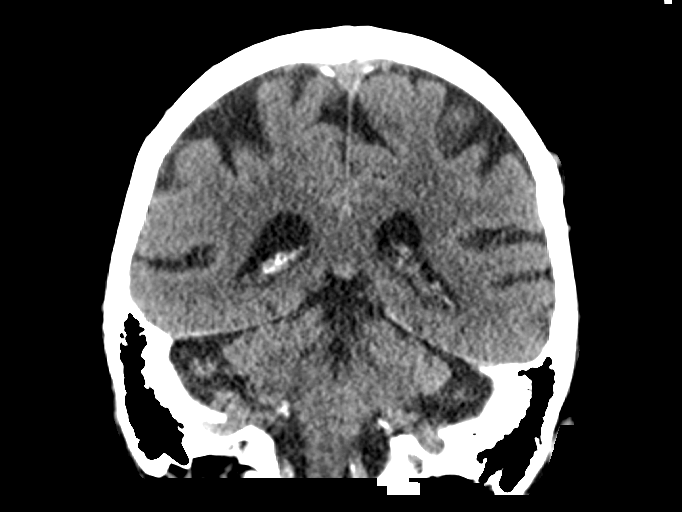
[im 46/76  brain]
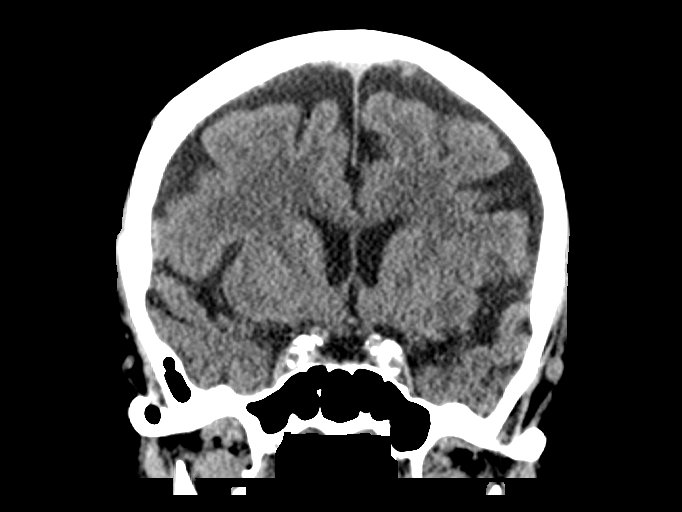

[Series 10: sagittal · sagittal · 0.35mm/px · 2 of 73 slices shown]
[im 25/73  brain]
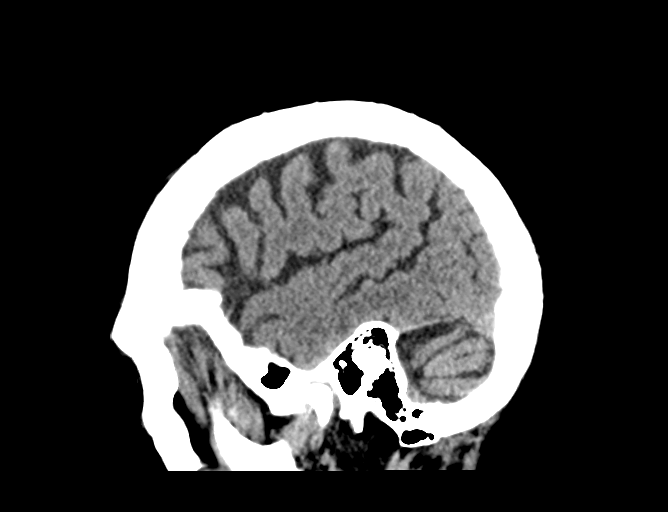
[im 49/73  brain]
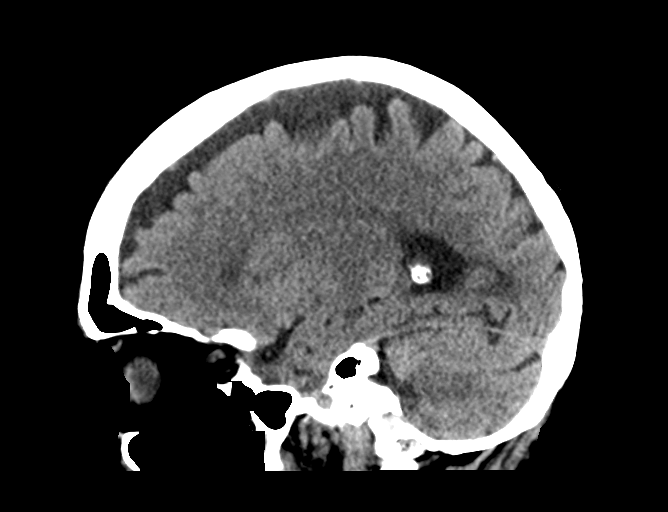

[Series 11: axial recon · axial · 0.23mm/px · z∈[-368,-182]mm · 9 of 119 slices shown, 12 images]
[im 11/119  brain]
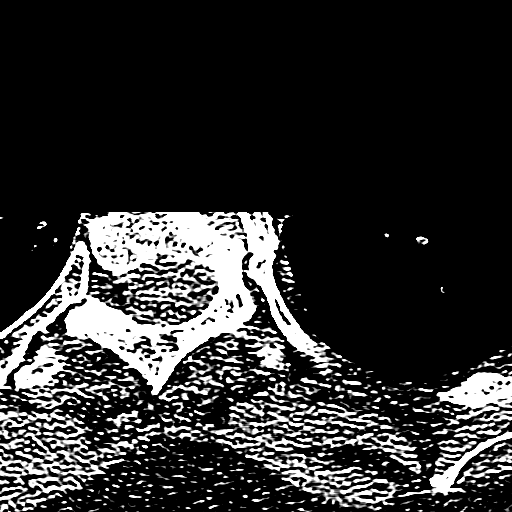
[im 11/119  bone]
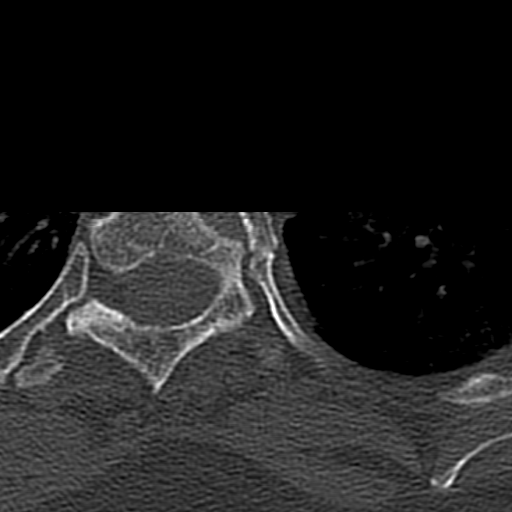
[im 22/119  brain]
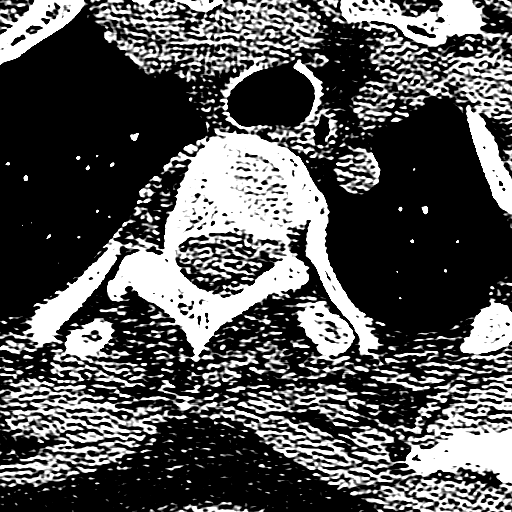
[im 33/119  brain]
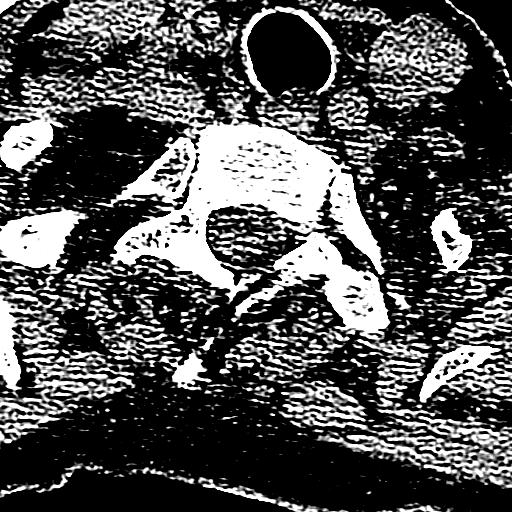
[im 43/119  brain]
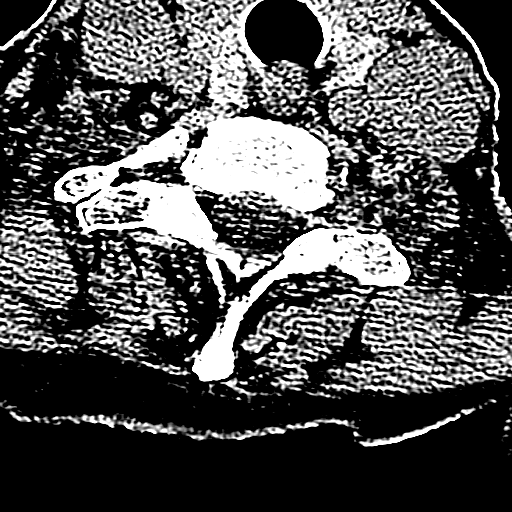
[im 65/119  brain]
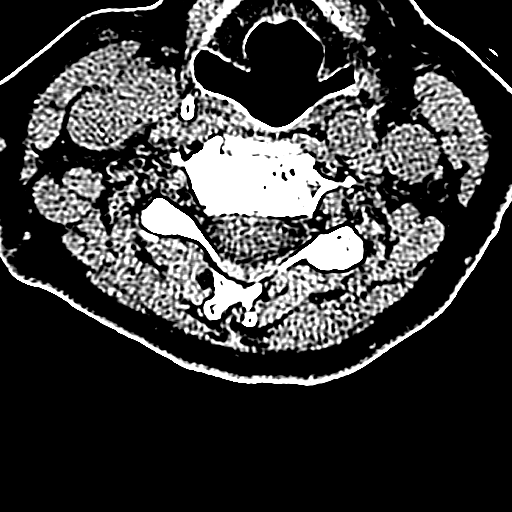
[im 65/119  bone]
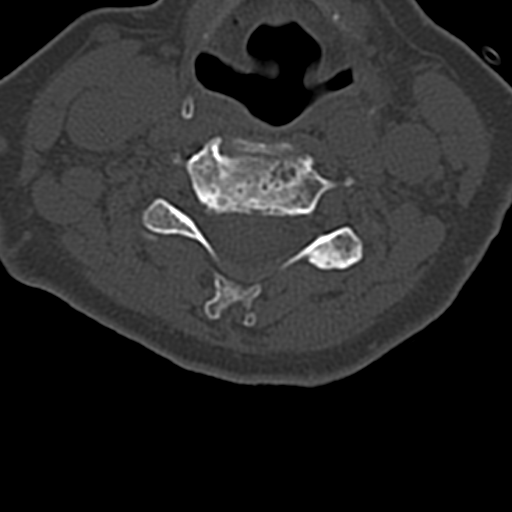
[im 76/119  brain]
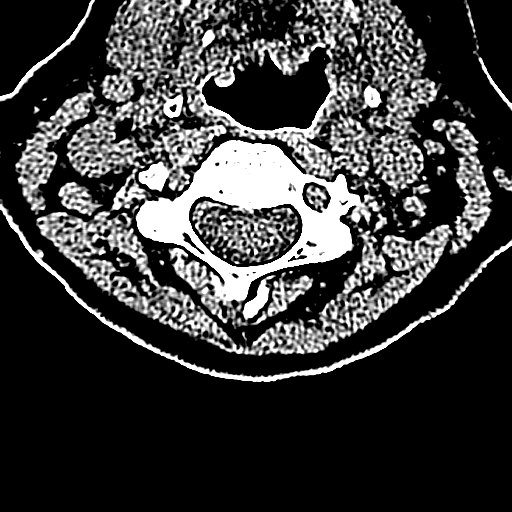
[im 86/119  brain]
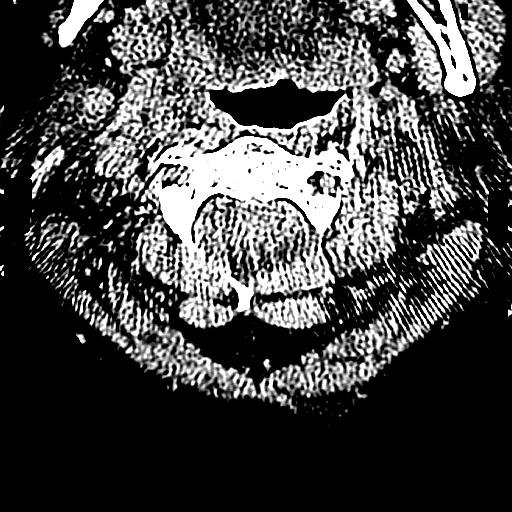
[im 97/119  brain]
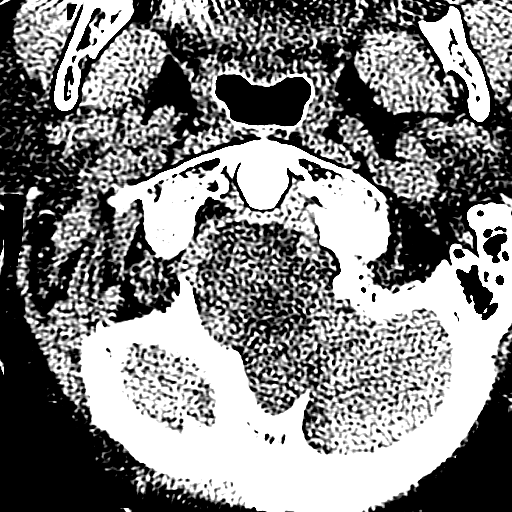
[im 108/119  brain]
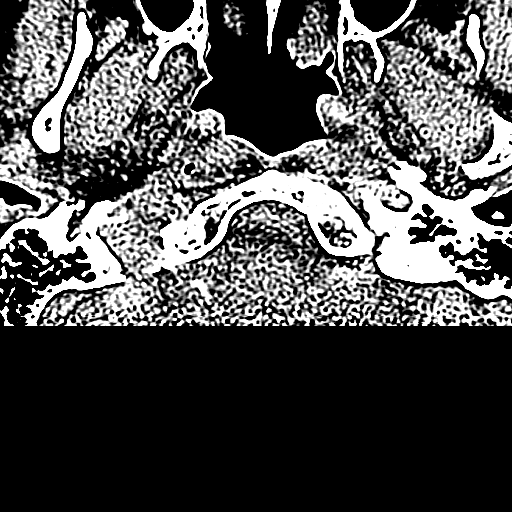
[im 108/119  bone]
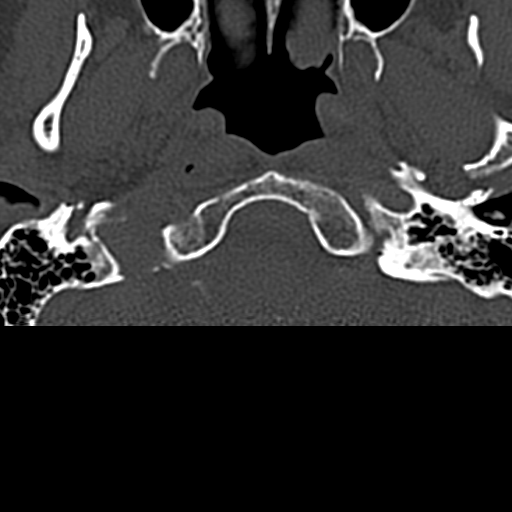

[14 of 47 positions shown; findings below may reference images not displayed]

FINDINGS: CT HEAD FINDINGS

Brain: Ventricles and cisterns are within normal. There is mild age
related atrophic change. There is no mass, mass effect, shift of
midline structures or acute hemorrhage. No evidence of acute
infarction. Minimal chronic ischemic microvascular disease.

Vascular: Mild plaque over the cavernous segment of the internal
carotid arteries.

Skull: Within normal.

Sinuses/Orbits: Orbits are within normal. Paranasal sinuses are well
developed and well aerated without air-fluid levels or significant
opacification.

Other: None.

CT CERVICAL SPINE FINDINGS

Alignment:  Within normal.

Skull base and vertebrae: Vertebral body alignment and heights are
within normal. There is mild spondylosis throughout the cervical
spine. There is moderate uncovertebral joint spurring and mild facet
arthropathy. No acute fracture.

Soft tissues and spinal canal: Prevertebral soft tissues are within
normal. Spinal canal is within normal.

Disc levels: Mild disc space narrowing at the C3-4, C4-5, C5-6 and
C6-7 levels.

Upper chest: Within normal.

Other: None.
IMPRESSION: No acute intracranial findings.

Minimal chronic ischemic microvascular disease.

No acute cervical spine injury.

Mild spondylosis throughout the cervical spine with mild multilevel
disc disease.

## 2017-08-29 IMAGING — CR DG HIP (WITH OR WITHOUT PELVIS) 2-3V*R*
3 series · 3 of 3 positions shown · non-contrast
Comparison: None.

CLINICAL DATA: Acute onset of syncope and fall. Right hip pain.
Initial encounter.

EXAM:
DG HIP (WITH OR WITHOUT PELVIS) 2-3V RIGHT

[w hip lat right]
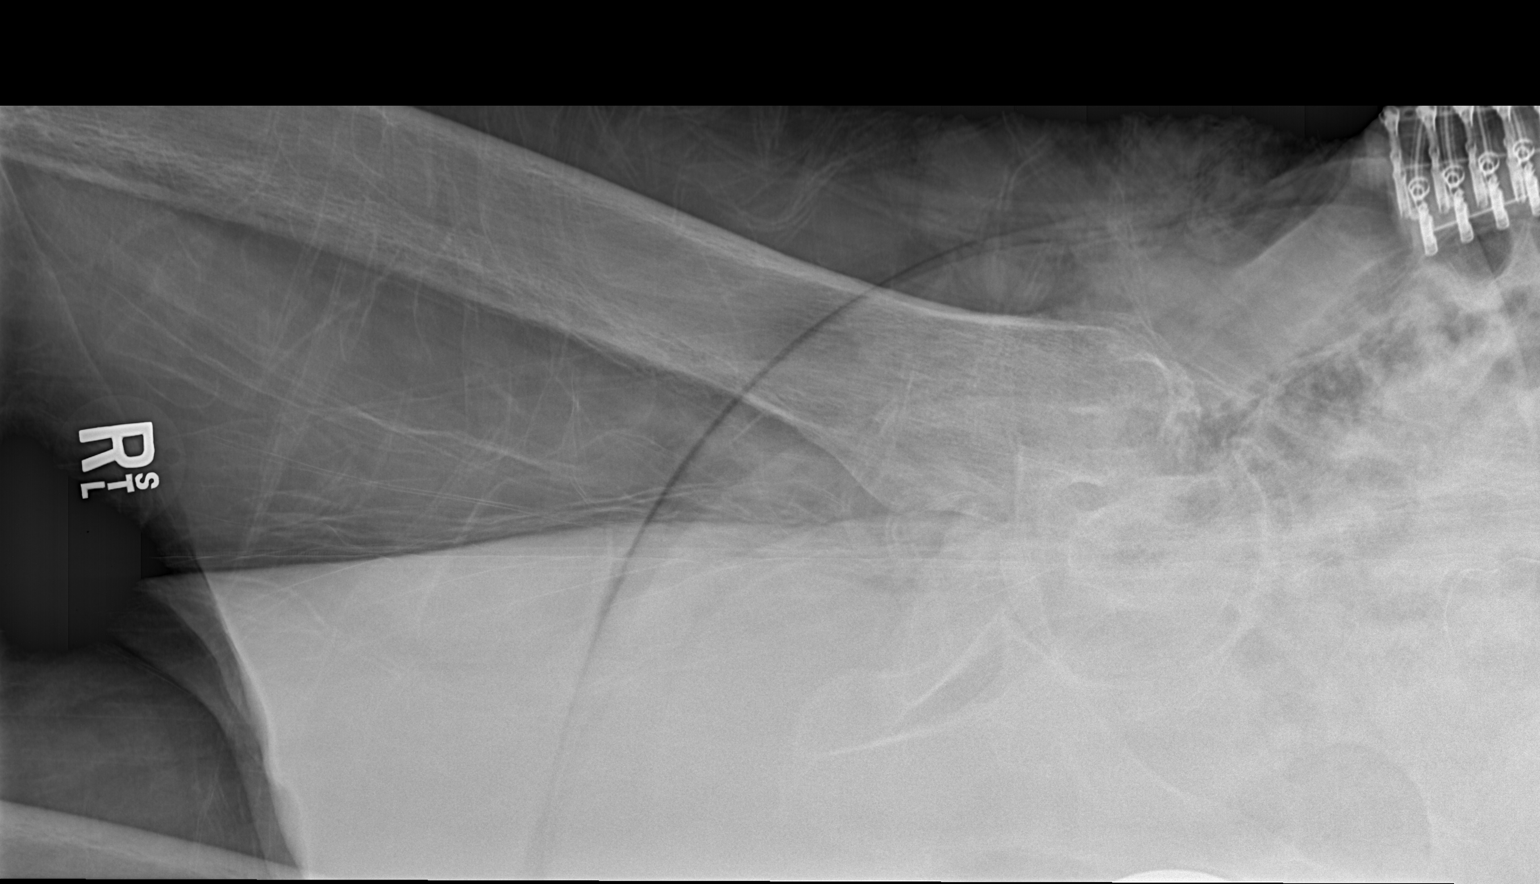

[x pelvis (1 of 2)]
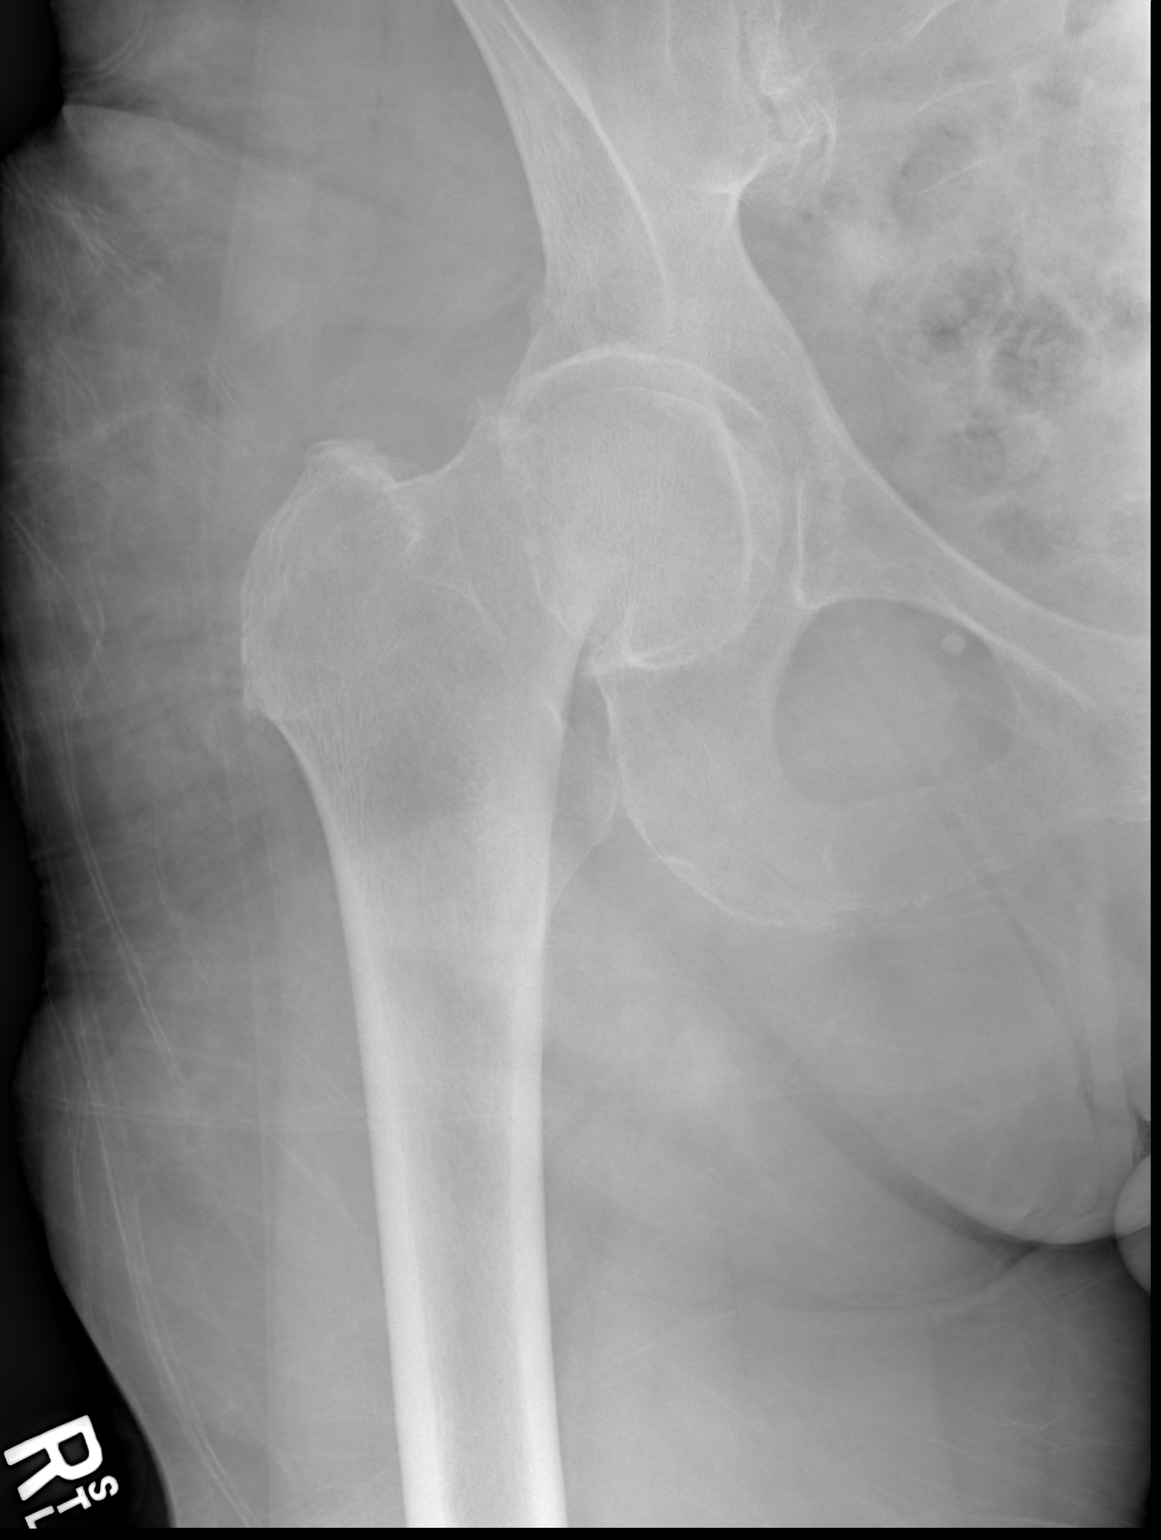

[x pelvis (2 of 2)]
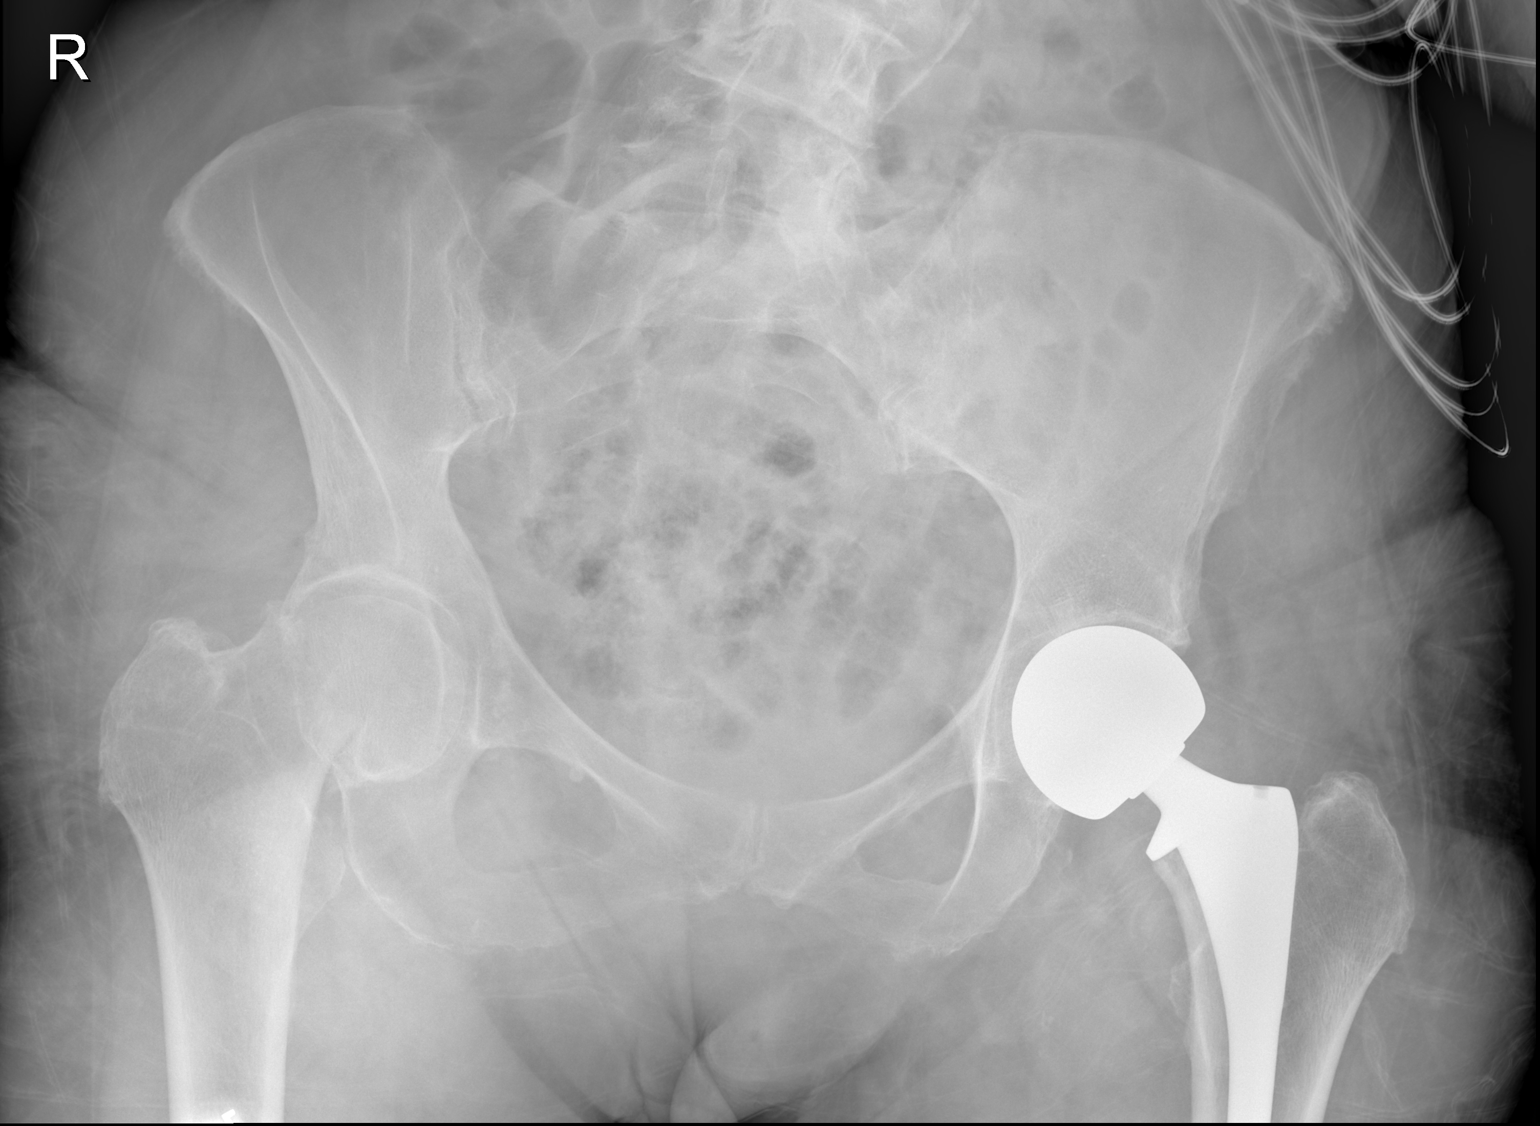

[3 of 3 positions shown; findings below may reference images not displayed]

FINDINGS: There is a mildly displaced subcapital fracture through the right
femoral neck, with superior displacement of the distal femur.

The right femoral head remains seated at the acetabulum. The left
hip arthroplasty is grossly unremarkable. Mild degenerative change
is noted at the lower lumbar spine. The sacroiliac joints are
unremarkable in appearance.

The visualized bowel gas pattern is grossly unremarkable in
appearance.
IMPRESSION: Mildly displaced subcapital fracture through the right femoral neck,
with superior displacement of the distal femur.

## 2017-08-29 IMAGING — CR DG CHEST 1V
1 series · 1 of 1 positions shown · non-contrast
Comparison: Chest radiograph performed 08/30/2016

CLINICAL DATA: Preoperative chest radiograph, for hip fracture.
Cough. Initial encounter.

EXAM:
CHEST 1 VIEW

[x chest ap]
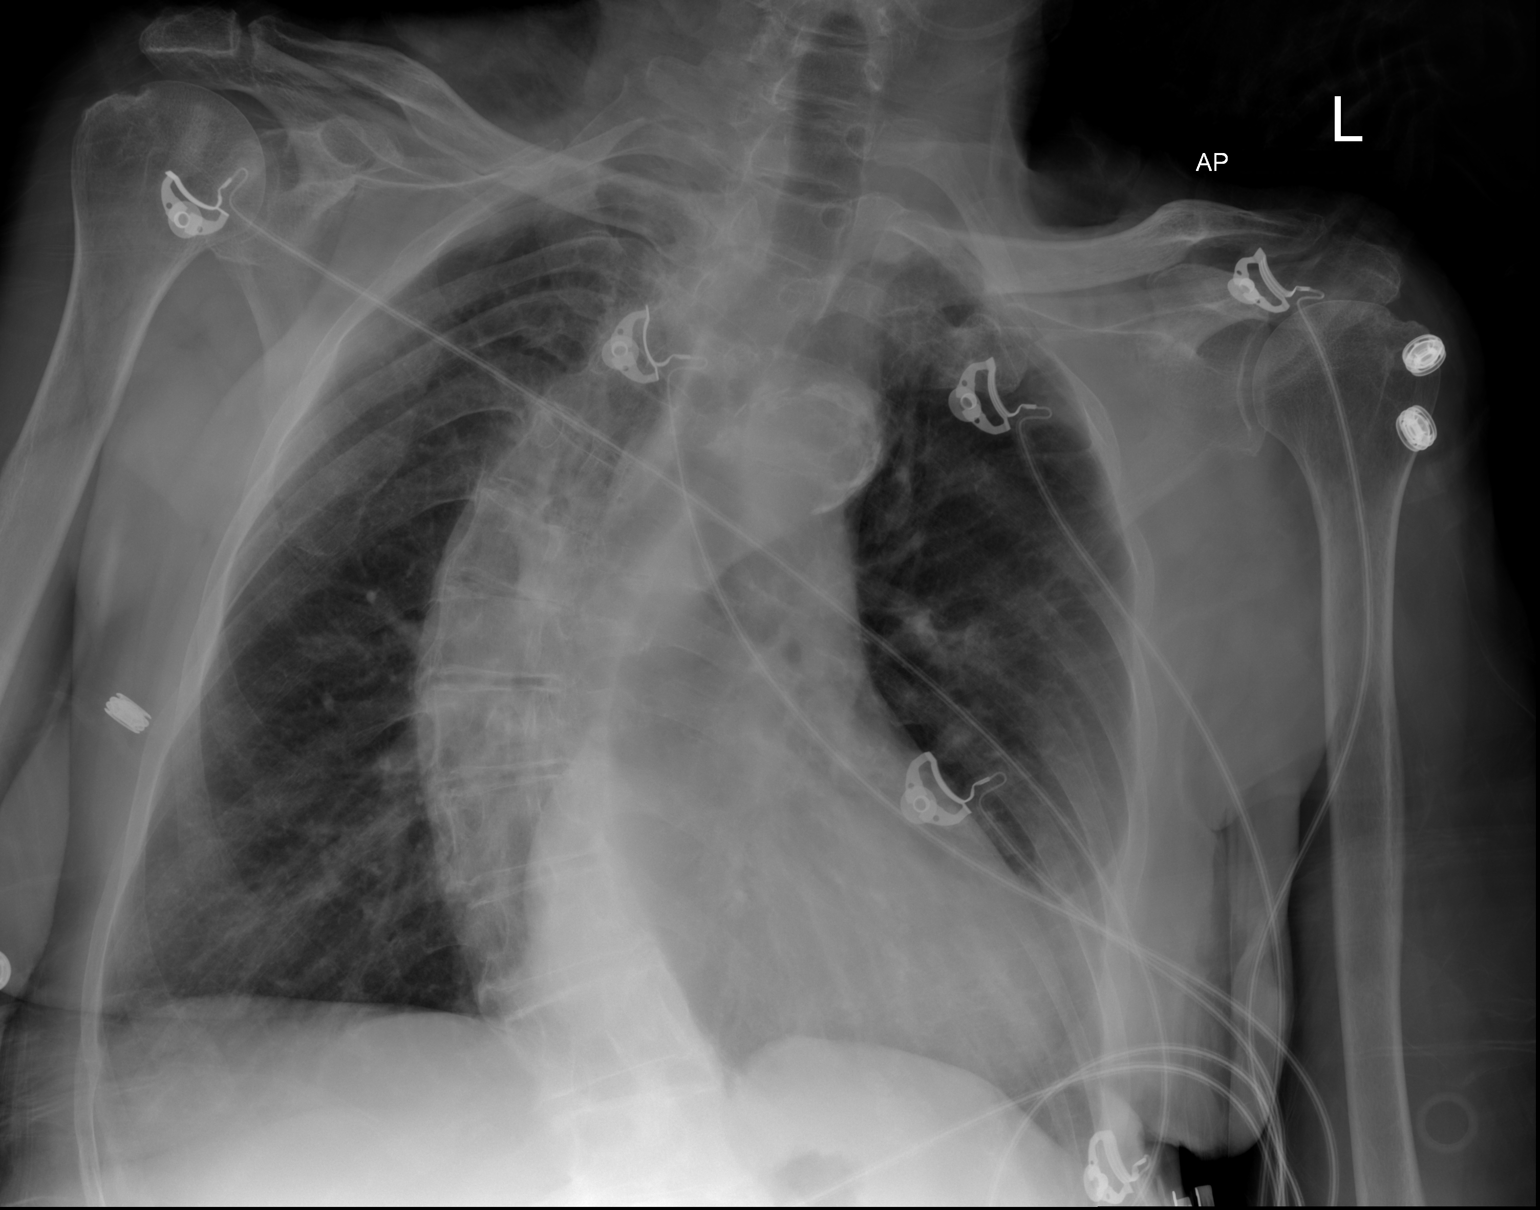

[1 of 1 positions shown; findings below may reference images not displayed]

FINDINGS: The lungs are well-aerated and clear. There is no evidence of focal
opacification, pleural effusion or pneumothorax.

The cardiomediastinal silhouette is borderline normal in size. No
acute osseous abnormalities are seen. Right convex thoracic
scoliosis is noted.
IMPRESSION: No acute cardiopulmonary process seen. Right convex thoracic
scoliosis noted. No displaced rib fractures identified.

## 2017-08-30 IMAGING — DX DG PORTABLE PELVIS
1 series · 1 of 1 positions shown · non-contrast
Comparison: 08/31/2016

CLINICAL DATA: Status post right hip arthroplasty.

EXAM:
PORTABLE PELVIS 1-2 VIEWS

[pelvis ap]
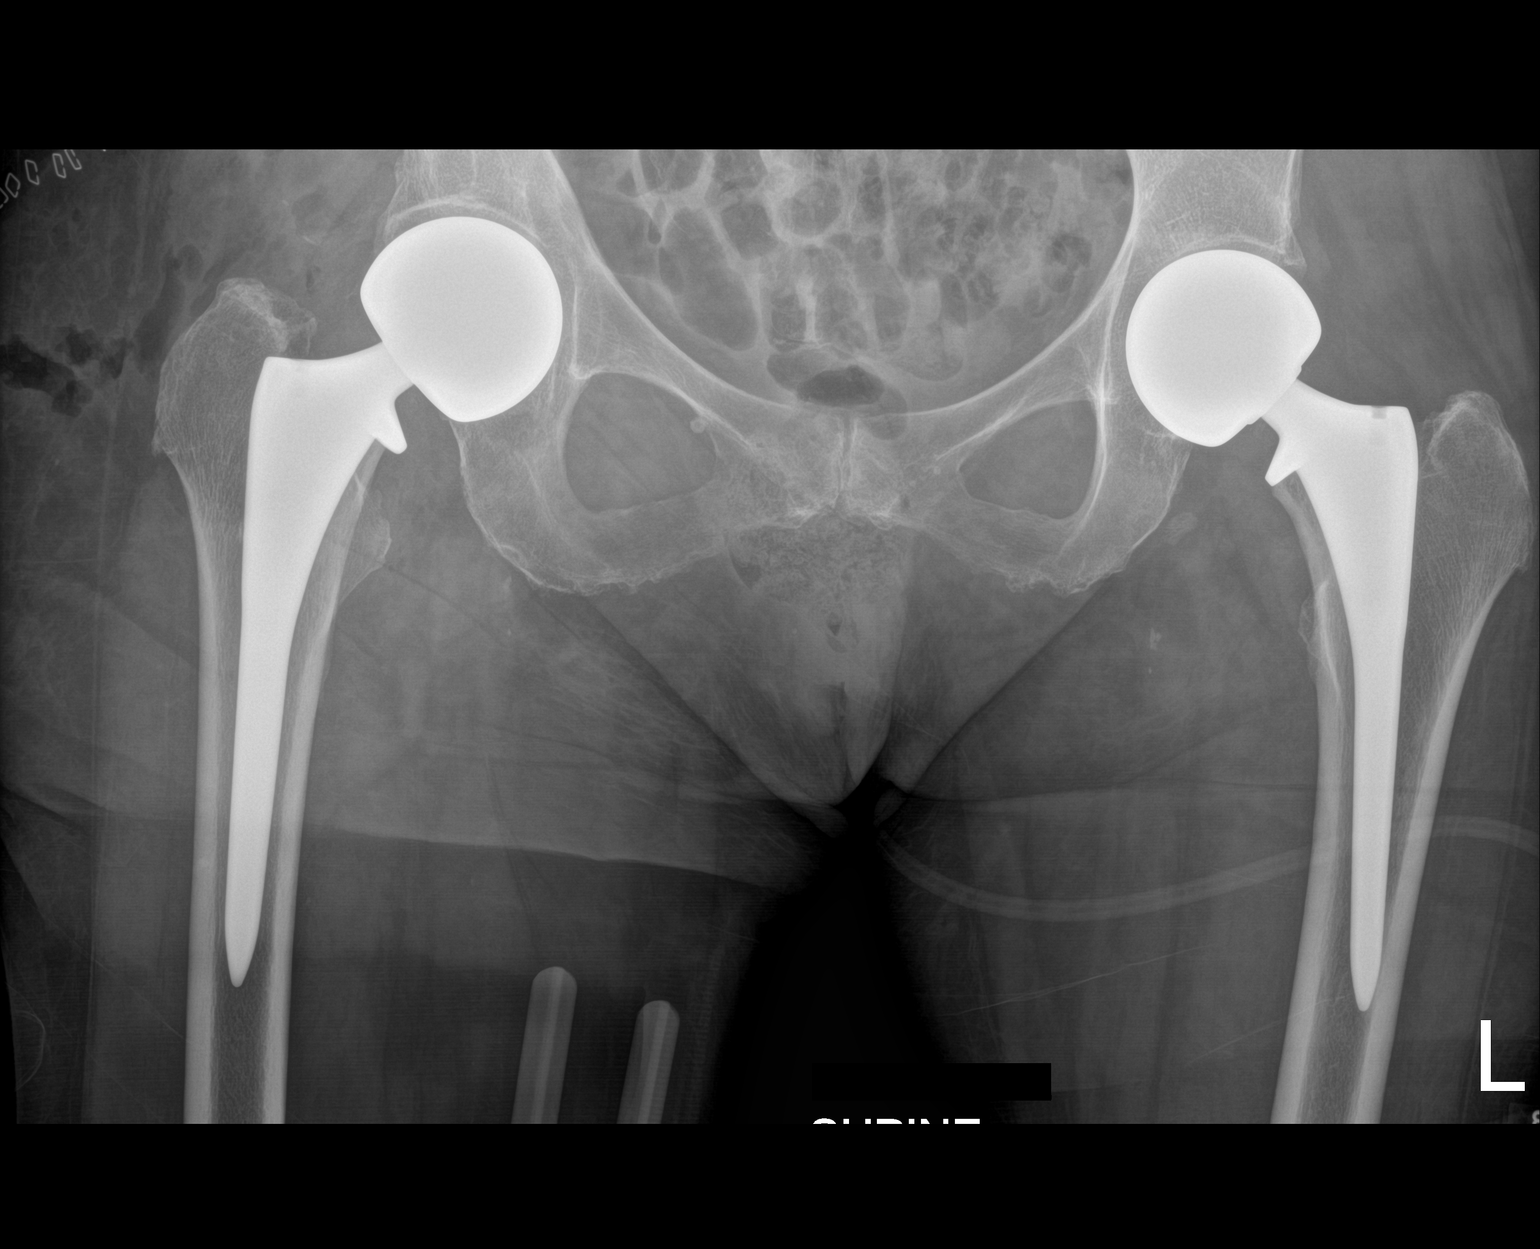

[1 of 1 positions shown; findings below may reference images not displayed]

FINDINGS: New right hip hemiarthroplasty appears well-seated and aligned. Left
hemiarthroplasty is stable, also well-seated and aligned.

No acute fracture or evidence of an operative complication.
IMPRESSION: Well-positioned right hip hemiarthroplasty.

## 2017-09-01 DIAGNOSIS — Z23 Encounter for immunization: Secondary | ICD-10-CM | POA: Diagnosis not present

## 2017-09-02 IMAGING — DX DG CHEST 1V PORT
1 series · 1 of 1 positions shown · non-contrast
Comparison: 12/05/2016

CLINICAL DATA: Shortness of breath, cough

EXAM:
PORTABLE CHEST 1 VIEW

[chest ap]
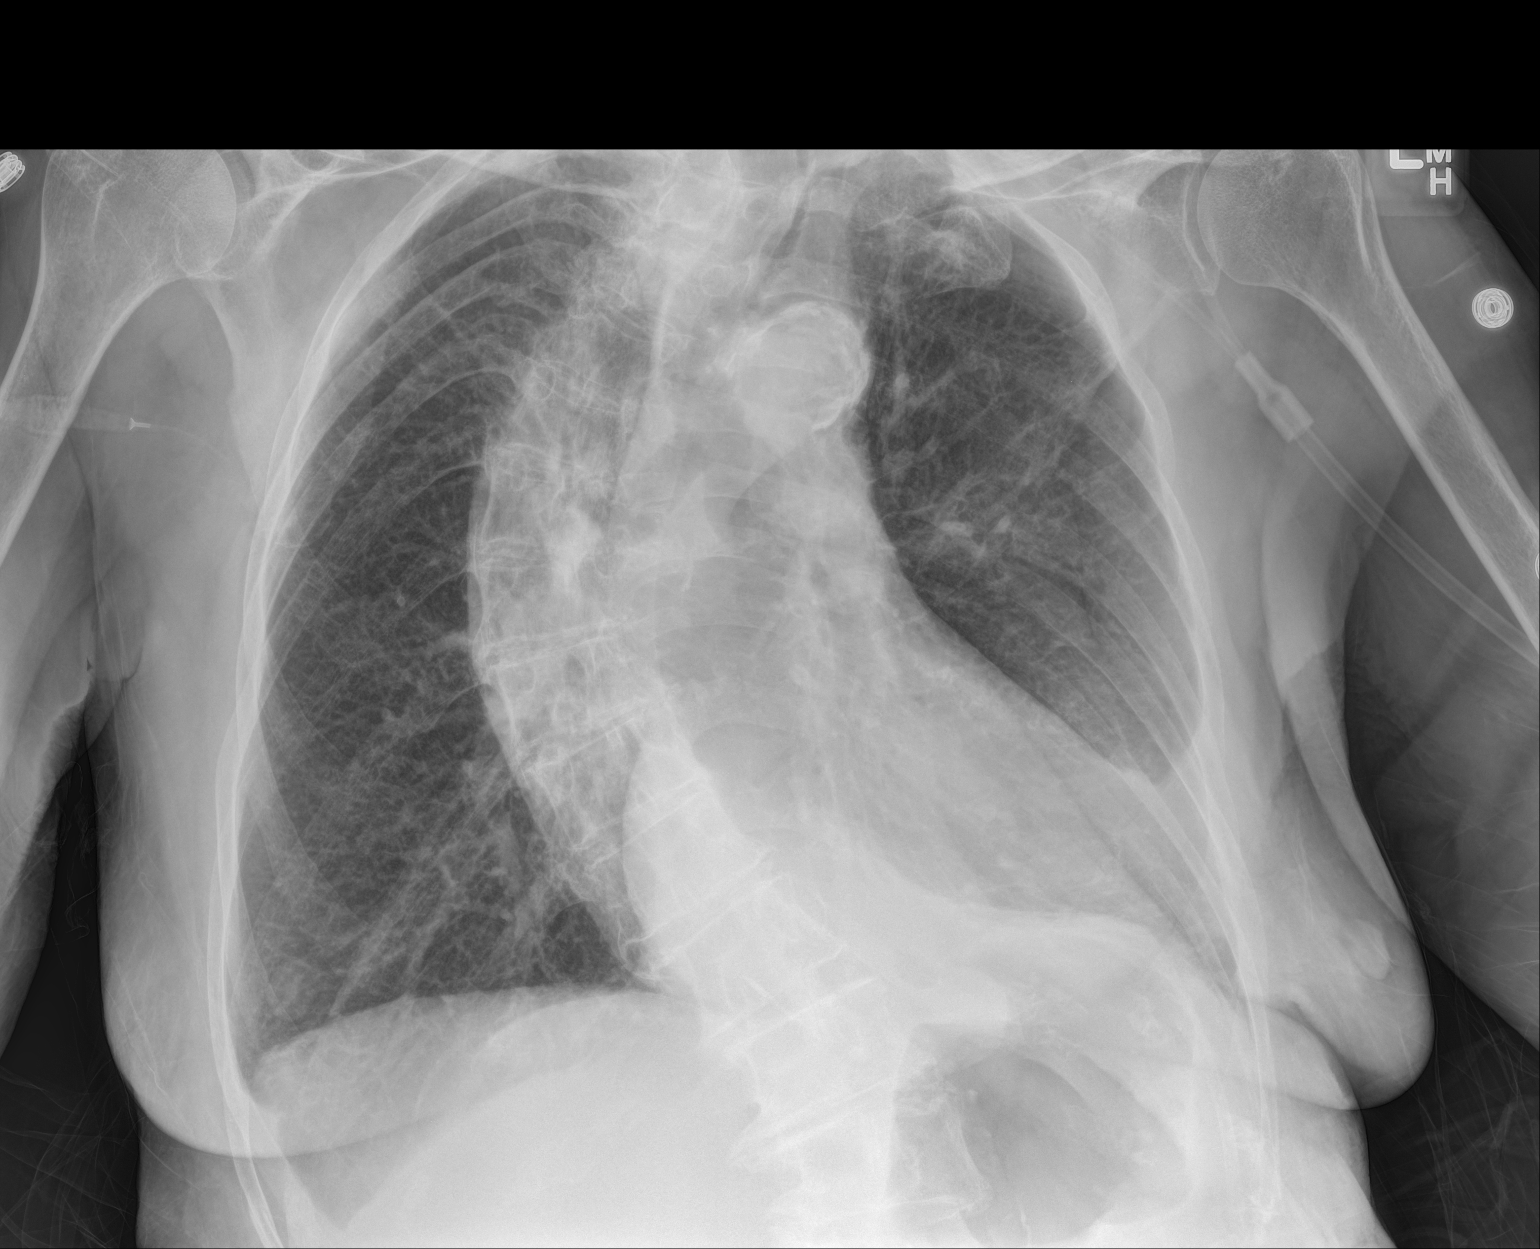

[1 of 1 positions shown; findings below may reference images not displayed]

FINDINGS: There is hyperinflation of the lungs compatible with COPD. Left
lower lobe atelectasis. Right lung is clear. No effusions. Heart is
mildly enlarged. Tortuosity of the thoracic aorta with
calcifications. Severe thoracolumbar scoliosis.
IMPRESSION: COPD.  Left base atelectasis.

## 2017-09-07 DIAGNOSIS — L605 Yellow nail syndrome: Secondary | ICD-10-CM | POA: Diagnosis not present

## 2017-09-07 DIAGNOSIS — R609 Edema, unspecified: Secondary | ICD-10-CM | POA: Diagnosis not present

## 2017-09-07 DIAGNOSIS — M79675 Pain in left toe(s): Secondary | ICD-10-CM | POA: Diagnosis not present

## 2017-09-07 DIAGNOSIS — M2060 Acquired deformities of toe(s), unspecified, unspecified foot: Secondary | ICD-10-CM | POA: Diagnosis not present

## 2017-09-07 DIAGNOSIS — B351 Tinea unguium: Secondary | ICD-10-CM | POA: Diagnosis not present

## 2017-09-07 DIAGNOSIS — I872 Venous insufficiency (chronic) (peripheral): Secondary | ICD-10-CM | POA: Diagnosis not present

## 2017-09-07 DIAGNOSIS — R209 Unspecified disturbances of skin sensation: Secondary | ICD-10-CM | POA: Diagnosis not present

## 2017-09-07 DIAGNOSIS — M24576 Contracture, unspecified foot: Secondary | ICD-10-CM | POA: Diagnosis not present

## 2017-09-14 DIAGNOSIS — F329 Major depressive disorder, single episode, unspecified: Secondary | ICD-10-CM | POA: Diagnosis not present

## 2017-09-14 DIAGNOSIS — F0391 Unspecified dementia with behavioral disturbance: Secondary | ICD-10-CM | POA: Diagnosis not present

## 2017-09-15 IMAGING — DX DG PORTABLE PELVIS
1 series · 1 of 1 positions shown · non-contrast
Comparison: Right hip radiographs performed earlier today at [DATE]
a.m.

CLINICAL DATA: Status post debridement of right hip. Assess for
dislocation. Initial encounter.

EXAM:
PORTABLE PELVIS 1-2 VIEWS

[pelvis ap]
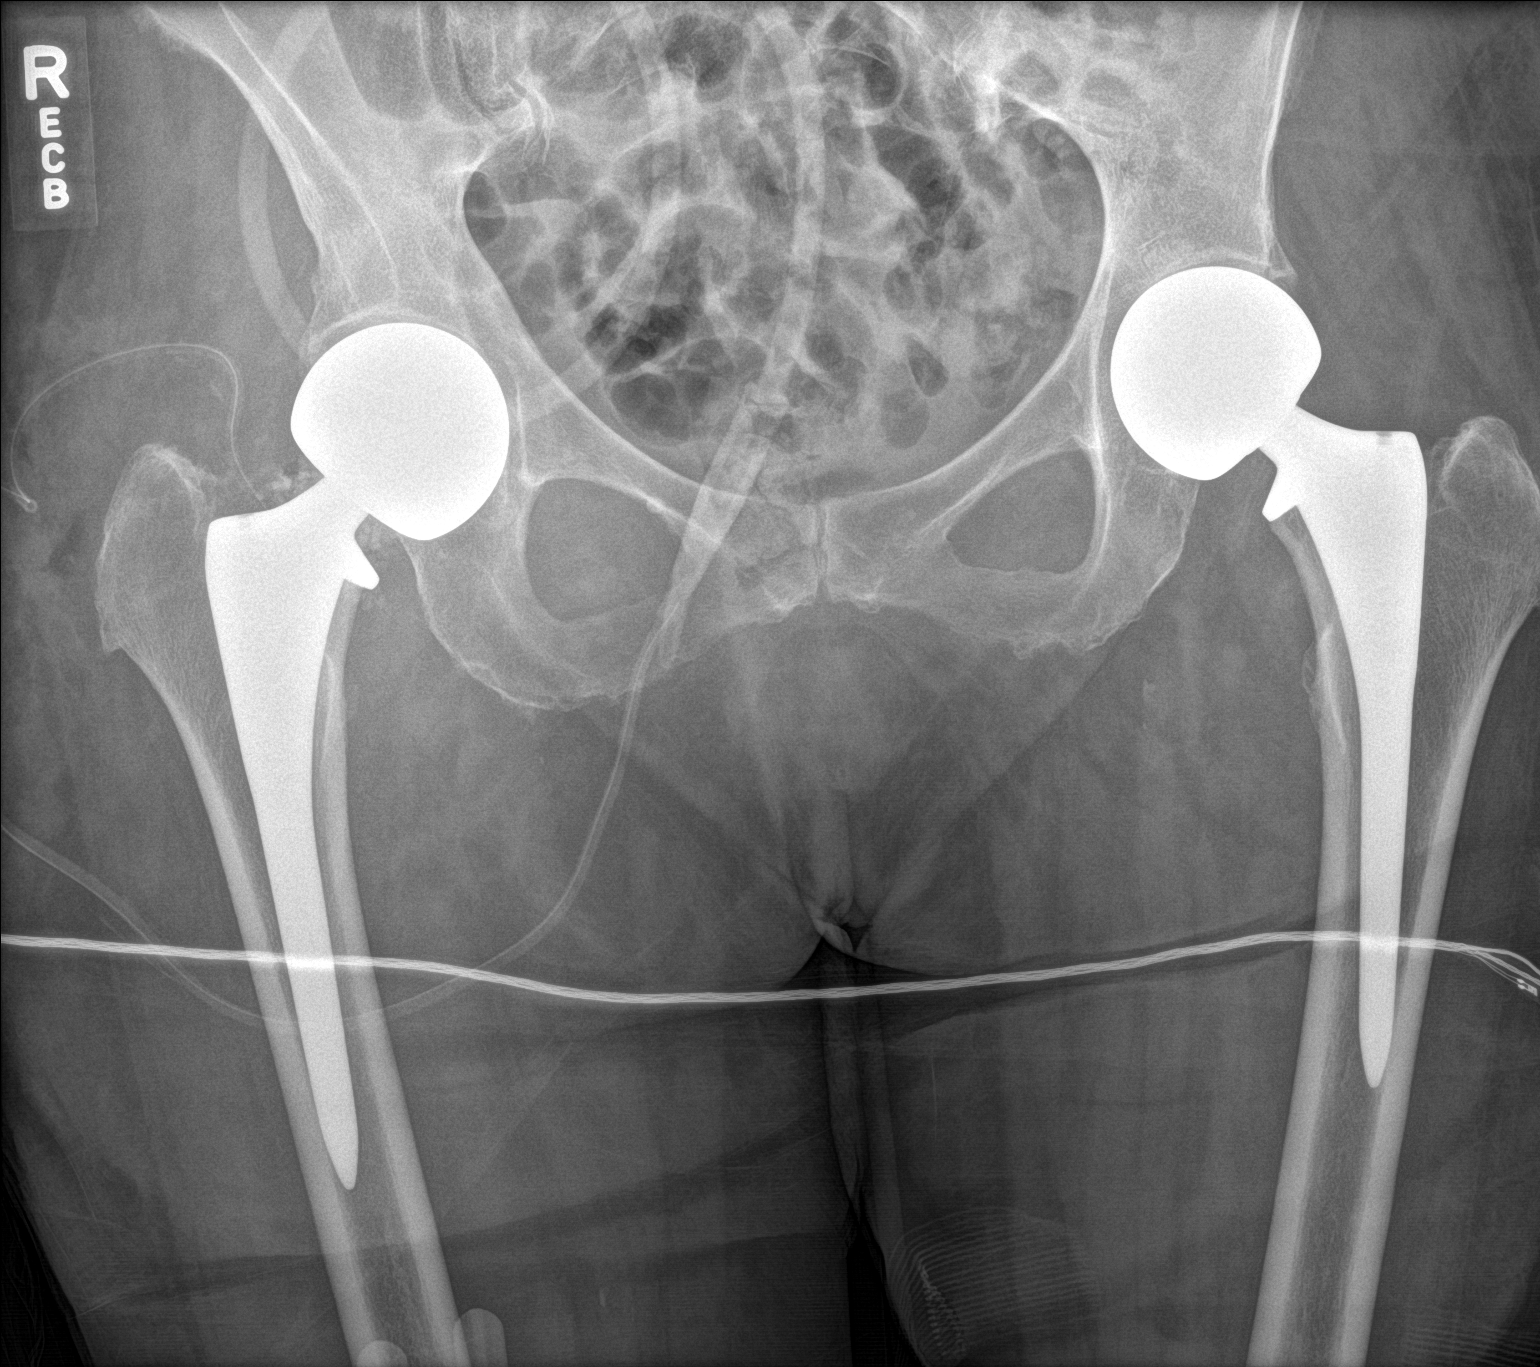

[1 of 1 positions shown; findings below may reference images not displayed]

FINDINGS: There is no evidence of dislocation at this time. Both hip
arthroplasties are grossly unremarkable in appearance, without
evidence of loosening. There is no evidence of fracture.
Postoperative air is seen about the right hip, with associated
drainage catheter.
IMPRESSION: No evidence of dislocation at this time. Bilateral hip
arthroplasties are unremarkable in appearance.

## 2017-09-15 IMAGING — CR DG CHEST 1V PORT
1 series · 1 of 1 positions shown · non-contrast
Comparison: 12/09/2016

CLINICAL DATA: Preop testing right hip surgery

EXAM:
PORTABLE CHEST 1 VIEW

[AP]
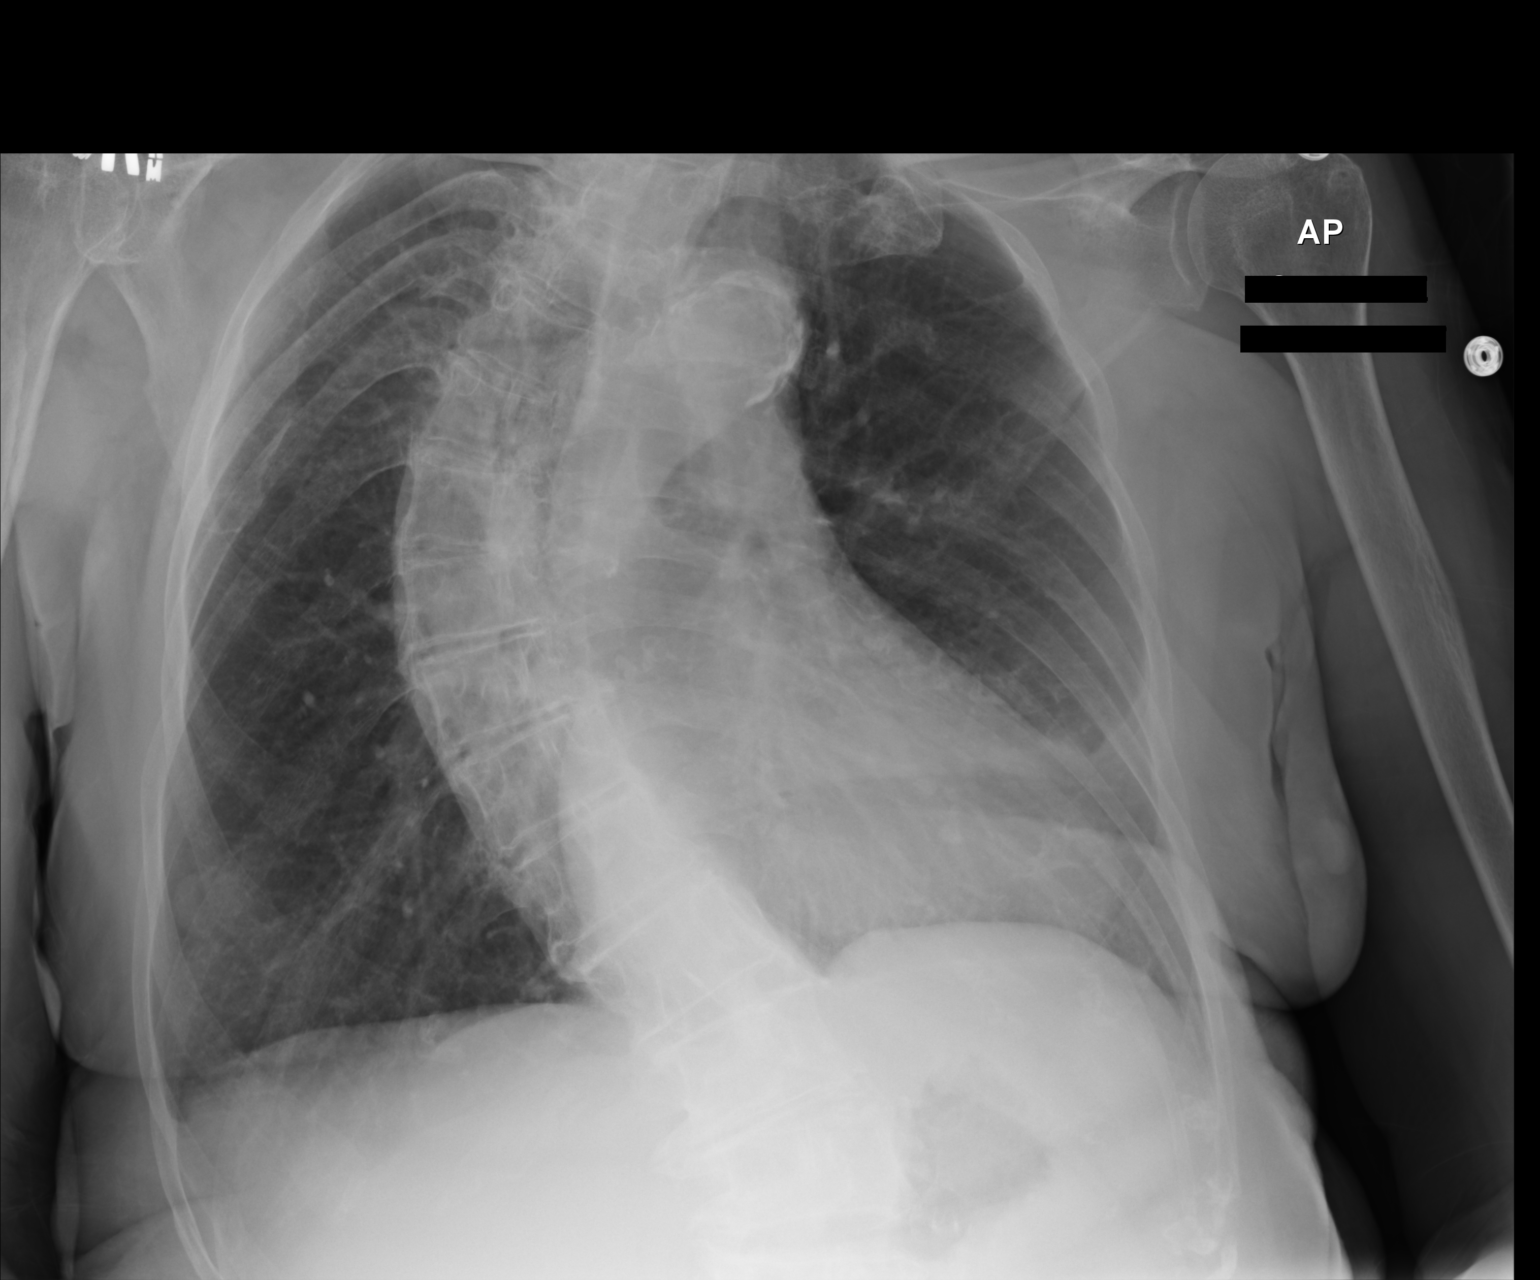

[1 of 1 positions shown; findings below may reference images not displayed]

FINDINGS: Lungs remain clear without infiltrate or effusion. Negative for
heart failure. Atherosclerotic calcification aortic arch. Marked
dextroscoliosis thoracic spine unchanged.
IMPRESSION: No active disease.

## 2017-09-23 DIAGNOSIS — F329 Major depressive disorder, single episode, unspecified: Secondary | ICD-10-CM | POA: Diagnosis not present

## 2017-09-23 DIAGNOSIS — F0391 Unspecified dementia with behavioral disturbance: Secondary | ICD-10-CM | POA: Diagnosis not present

## 2017-09-30 DIAGNOSIS — M1990 Unspecified osteoarthritis, unspecified site: Secondary | ICD-10-CM | POA: Diagnosis not present

## 2017-09-30 DIAGNOSIS — G4709 Other insomnia: Secondary | ICD-10-CM | POA: Diagnosis not present

## 2017-09-30 DIAGNOSIS — R600 Localized edema: Secondary | ICD-10-CM | POA: Diagnosis not present

## 2017-09-30 DIAGNOSIS — R030 Elevated blood-pressure reading, without diagnosis of hypertension: Secondary | ICD-10-CM | POA: Diagnosis not present

## 2017-09-30 DIAGNOSIS — R269 Unspecified abnormalities of gait and mobility: Secondary | ICD-10-CM | POA: Diagnosis not present

## 2017-09-30 DIAGNOSIS — Z66 Do not resuscitate: Secondary | ICD-10-CM | POA: Diagnosis not present

## 2017-09-30 DIAGNOSIS — G308 Other Alzheimer's disease: Secondary | ICD-10-CM | POA: Diagnosis not present

## 2017-10-05 DIAGNOSIS — M810 Age-related osteoporosis without current pathological fracture: Secondary | ICD-10-CM | POA: Diagnosis not present

## 2017-10-05 DIAGNOSIS — G309 Alzheimer's disease, unspecified: Secondary | ICD-10-CM | POA: Diagnosis not present

## 2017-10-05 DIAGNOSIS — G4700 Insomnia, unspecified: Secondary | ICD-10-CM | POA: Diagnosis not present

## 2017-10-05 DIAGNOSIS — R600 Localized edema: Secondary | ICD-10-CM | POA: Diagnosis not present

## 2017-10-05 DIAGNOSIS — R269 Unspecified abnormalities of gait and mobility: Secondary | ICD-10-CM | POA: Diagnosis not present

## 2017-10-07 DIAGNOSIS — I10 Essential (primary) hypertension: Secondary | ICD-10-CM | POA: Diagnosis not present

## 2017-10-07 DIAGNOSIS — R2689 Other abnormalities of gait and mobility: Secondary | ICD-10-CM | POA: Diagnosis not present

## 2017-10-07 DIAGNOSIS — F3289 Other specified depressive episodes: Secondary | ICD-10-CM | POA: Diagnosis not present

## 2017-10-07 DIAGNOSIS — M818 Other osteoporosis without current pathological fracture: Secondary | ICD-10-CM | POA: Diagnosis not present

## 2017-10-07 DIAGNOSIS — G308 Other Alzheimer's disease: Secondary | ICD-10-CM | POA: Diagnosis not present

## 2017-10-09 DIAGNOSIS — F329 Major depressive disorder, single episode, unspecified: Secondary | ICD-10-CM | POA: Diagnosis not present

## 2017-10-12 DIAGNOSIS — F329 Major depressive disorder, single episode, unspecified: Secondary | ICD-10-CM | POA: Diagnosis not present

## 2017-10-12 DIAGNOSIS — F0391 Unspecified dementia with behavioral disturbance: Secondary | ICD-10-CM | POA: Diagnosis not present

## 2017-10-16 DIAGNOSIS — F329 Major depressive disorder, single episode, unspecified: Secondary | ICD-10-CM | POA: Diagnosis not present

## 2017-10-21 DIAGNOSIS — M6281 Muscle weakness (generalized): Secondary | ICD-10-CM | POA: Diagnosis not present

## 2017-10-21 DIAGNOSIS — G4709 Other insomnia: Secondary | ICD-10-CM | POA: Diagnosis not present

## 2017-10-21 DIAGNOSIS — H3530 Unspecified macular degeneration: Secondary | ICD-10-CM | POA: Diagnosis not present

## 2017-10-21 DIAGNOSIS — R41841 Cognitive communication deficit: Secondary | ICD-10-CM | POA: Diagnosis not present

## 2017-10-21 DIAGNOSIS — F4323 Adjustment disorder with mixed anxiety and depressed mood: Secondary | ICD-10-CM | POA: Diagnosis not present

## 2017-10-21 DIAGNOSIS — F0151 Vascular dementia with behavioral disturbance: Secondary | ICD-10-CM | POA: Diagnosis not present

## 2017-10-21 DIAGNOSIS — R262 Difficulty in walking, not elsewhere classified: Secondary | ICD-10-CM | POA: Diagnosis not present

## 2017-10-21 DIAGNOSIS — I10 Essential (primary) hypertension: Secondary | ICD-10-CM | POA: Diagnosis not present

## 2017-10-21 DIAGNOSIS — R269 Unspecified abnormalities of gait and mobility: Secondary | ICD-10-CM | POA: Diagnosis not present

## 2017-10-21 DIAGNOSIS — S6010XA Contusion of unspecified finger with damage to nail, initial encounter: Secondary | ICD-10-CM | POA: Diagnosis not present

## 2017-10-21 DIAGNOSIS — G308 Other Alzheimer's disease: Secondary | ICD-10-CM | POA: Diagnosis not present

## 2017-10-23 DIAGNOSIS — M6281 Muscle weakness (generalized): Secondary | ICD-10-CM | POA: Diagnosis not present

## 2017-10-23 DIAGNOSIS — R262 Difficulty in walking, not elsewhere classified: Secondary | ICD-10-CM | POA: Diagnosis not present

## 2017-10-23 DIAGNOSIS — R41841 Cognitive communication deficit: Secondary | ICD-10-CM | POA: Diagnosis not present

## 2017-10-23 DIAGNOSIS — H3530 Unspecified macular degeneration: Secondary | ICD-10-CM | POA: Diagnosis not present

## 2017-10-23 DIAGNOSIS — F0151 Vascular dementia with behavioral disturbance: Secondary | ICD-10-CM | POA: Diagnosis not present

## 2017-10-26 DIAGNOSIS — H3530 Unspecified macular degeneration: Secondary | ICD-10-CM | POA: Diagnosis not present

## 2017-10-26 DIAGNOSIS — R41841 Cognitive communication deficit: Secondary | ICD-10-CM | POA: Diagnosis not present

## 2017-10-26 DIAGNOSIS — R262 Difficulty in walking, not elsewhere classified: Secondary | ICD-10-CM | POA: Diagnosis not present

## 2017-10-26 DIAGNOSIS — M6281 Muscle weakness (generalized): Secondary | ICD-10-CM | POA: Diagnosis not present

## 2017-10-26 DIAGNOSIS — F0151 Vascular dementia with behavioral disturbance: Secondary | ICD-10-CM | POA: Diagnosis not present

## 2017-10-27 DIAGNOSIS — F0151 Vascular dementia with behavioral disturbance: Secondary | ICD-10-CM | POA: Diagnosis not present

## 2017-10-27 DIAGNOSIS — H3530 Unspecified macular degeneration: Secondary | ICD-10-CM | POA: Diagnosis not present

## 2017-10-27 DIAGNOSIS — R41841 Cognitive communication deficit: Secondary | ICD-10-CM | POA: Diagnosis not present

## 2017-10-27 DIAGNOSIS — R262 Difficulty in walking, not elsewhere classified: Secondary | ICD-10-CM | POA: Diagnosis not present

## 2017-10-27 DIAGNOSIS — M6281 Muscle weakness (generalized): Secondary | ICD-10-CM | POA: Diagnosis not present

## 2017-10-28 DIAGNOSIS — F0151 Vascular dementia with behavioral disturbance: Secondary | ICD-10-CM | POA: Diagnosis not present

## 2017-10-28 DIAGNOSIS — G4709 Other insomnia: Secondary | ICD-10-CM | POA: Diagnosis not present

## 2017-10-28 DIAGNOSIS — F3289 Other specified depressive episodes: Secondary | ICD-10-CM | POA: Diagnosis not present

## 2017-10-28 DIAGNOSIS — R262 Difficulty in walking, not elsewhere classified: Secondary | ICD-10-CM | POA: Diagnosis not present

## 2017-10-28 DIAGNOSIS — M810 Age-related osteoporosis without current pathological fracture: Secondary | ICD-10-CM | POA: Diagnosis not present

## 2017-10-28 DIAGNOSIS — R41841 Cognitive communication deficit: Secondary | ICD-10-CM | POA: Diagnosis not present

## 2017-10-28 DIAGNOSIS — R2689 Other abnormalities of gait and mobility: Secondary | ICD-10-CM | POA: Diagnosis not present

## 2017-10-28 DIAGNOSIS — H3530 Unspecified macular degeneration: Secondary | ICD-10-CM | POA: Diagnosis not present

## 2017-10-28 DIAGNOSIS — G308 Other Alzheimer's disease: Secondary | ICD-10-CM | POA: Diagnosis not present

## 2017-10-28 DIAGNOSIS — F418 Other specified anxiety disorders: Secondary | ICD-10-CM | POA: Diagnosis not present

## 2017-10-28 DIAGNOSIS — M6281 Muscle weakness (generalized): Secondary | ICD-10-CM | POA: Diagnosis not present

## 2017-10-30 DIAGNOSIS — M6281 Muscle weakness (generalized): Secondary | ICD-10-CM | POA: Diagnosis not present

## 2017-10-30 DIAGNOSIS — R41841 Cognitive communication deficit: Secondary | ICD-10-CM | POA: Diagnosis not present

## 2017-10-30 DIAGNOSIS — F0151 Vascular dementia with behavioral disturbance: Secondary | ICD-10-CM | POA: Diagnosis not present

## 2017-10-30 DIAGNOSIS — H3530 Unspecified macular degeneration: Secondary | ICD-10-CM | POA: Diagnosis not present

## 2017-10-30 DIAGNOSIS — R262 Difficulty in walking, not elsewhere classified: Secondary | ICD-10-CM | POA: Diagnosis not present

## 2017-11-02 DIAGNOSIS — H3530 Unspecified macular degeneration: Secondary | ICD-10-CM | POA: Diagnosis not present

## 2017-11-02 DIAGNOSIS — F0151 Vascular dementia with behavioral disturbance: Secondary | ICD-10-CM | POA: Diagnosis not present

## 2017-11-02 DIAGNOSIS — M6281 Muscle weakness (generalized): Secondary | ICD-10-CM | POA: Diagnosis not present

## 2017-11-02 DIAGNOSIS — R262 Difficulty in walking, not elsewhere classified: Secondary | ICD-10-CM | POA: Diagnosis not present

## 2017-11-02 DIAGNOSIS — R41841 Cognitive communication deficit: Secondary | ICD-10-CM | POA: Diagnosis not present

## 2017-11-04 DIAGNOSIS — F0151 Vascular dementia with behavioral disturbance: Secondary | ICD-10-CM | POA: Diagnosis not present

## 2017-11-04 DIAGNOSIS — R262 Difficulty in walking, not elsewhere classified: Secondary | ICD-10-CM | POA: Diagnosis not present

## 2017-11-04 DIAGNOSIS — R41841 Cognitive communication deficit: Secondary | ICD-10-CM | POA: Diagnosis not present

## 2017-11-04 DIAGNOSIS — H3530 Unspecified macular degeneration: Secondary | ICD-10-CM | POA: Diagnosis not present

## 2017-11-04 DIAGNOSIS — M6281 Muscle weakness (generalized): Secondary | ICD-10-CM | POA: Diagnosis not present

## 2017-11-05 DIAGNOSIS — R262 Difficulty in walking, not elsewhere classified: Secondary | ICD-10-CM | POA: Diagnosis not present

## 2017-11-05 DIAGNOSIS — H3530 Unspecified macular degeneration: Secondary | ICD-10-CM | POA: Diagnosis not present

## 2017-11-05 DIAGNOSIS — M6281 Muscle weakness (generalized): Secondary | ICD-10-CM | POA: Diagnosis not present

## 2017-11-05 DIAGNOSIS — F0151 Vascular dementia with behavioral disturbance: Secondary | ICD-10-CM | POA: Diagnosis not present

## 2017-11-05 DIAGNOSIS — R41841 Cognitive communication deficit: Secondary | ICD-10-CM | POA: Diagnosis not present

## 2017-11-06 DIAGNOSIS — R41841 Cognitive communication deficit: Secondary | ICD-10-CM | POA: Diagnosis not present

## 2017-11-06 DIAGNOSIS — R262 Difficulty in walking, not elsewhere classified: Secondary | ICD-10-CM | POA: Diagnosis not present

## 2017-11-06 DIAGNOSIS — F0151 Vascular dementia with behavioral disturbance: Secondary | ICD-10-CM | POA: Diagnosis not present

## 2017-11-06 DIAGNOSIS — M6281 Muscle weakness (generalized): Secondary | ICD-10-CM | POA: Diagnosis not present

## 2017-11-06 DIAGNOSIS — H3530 Unspecified macular degeneration: Secondary | ICD-10-CM | POA: Diagnosis not present

## 2017-11-08 DIAGNOSIS — F411 Generalized anxiety disorder: Secondary | ICD-10-CM | POA: Diagnosis not present

## 2017-11-08 DIAGNOSIS — F331 Major depressive disorder, recurrent, moderate: Secondary | ICD-10-CM | POA: Diagnosis not present

## 2017-11-09 DIAGNOSIS — F0151 Vascular dementia with behavioral disturbance: Secondary | ICD-10-CM | POA: Diagnosis not present

## 2017-11-09 DIAGNOSIS — G4700 Insomnia, unspecified: Secondary | ICD-10-CM | POA: Diagnosis not present

## 2017-11-09 DIAGNOSIS — R41841 Cognitive communication deficit: Secondary | ICD-10-CM | POA: Diagnosis not present

## 2017-11-09 DIAGNOSIS — R262 Difficulty in walking, not elsewhere classified: Secondary | ICD-10-CM | POA: Diagnosis not present

## 2017-11-09 DIAGNOSIS — M6281 Muscle weakness (generalized): Secondary | ICD-10-CM | POA: Diagnosis not present

## 2017-11-09 DIAGNOSIS — M810 Age-related osteoporosis without current pathological fracture: Secondary | ICD-10-CM | POA: Diagnosis not present

## 2017-11-09 DIAGNOSIS — H3530 Unspecified macular degeneration: Secondary | ICD-10-CM | POA: Diagnosis not present

## 2017-11-09 DIAGNOSIS — G309 Alzheimer's disease, unspecified: Secondary | ICD-10-CM | POA: Diagnosis not present

## 2017-11-09 DIAGNOSIS — R269 Unspecified abnormalities of gait and mobility: Secondary | ICD-10-CM | POA: Diagnosis not present
